# Patient Record
Sex: Female | Born: 1940 | Race: Black or African American | Hispanic: No | Marital: Single | Smoking: Former smoker
Health system: Southern US, Community
[De-identification: ages and names within clinical notes are randomized; demographics above are authoritative.]

## PROBLEM LIST (undated history)

## (undated) DIAGNOSIS — Z860100 Personal history of colon polyps, unspecified: Secondary | ICD-10-CM

## (undated) DIAGNOSIS — G5600 Carpal tunnel syndrome, unspecified upper limb: Secondary | ICD-10-CM

## (undated) DIAGNOSIS — F32A Depression, unspecified: Secondary | ICD-10-CM

## (undated) DIAGNOSIS — M1990 Unspecified osteoarthritis, unspecified site: Secondary | ICD-10-CM

## (undated) DIAGNOSIS — J45909 Unspecified asthma, uncomplicated: Secondary | ICD-10-CM

## (undated) DIAGNOSIS — K5790 Diverticulosis of intestine, part unspecified, without perforation or abscess without bleeding: Secondary | ICD-10-CM

## (undated) DIAGNOSIS — R55 Syncope and collapse: Secondary | ICD-10-CM

## (undated) DIAGNOSIS — K219 Gastro-esophageal reflux disease without esophagitis: Secondary | ICD-10-CM

## (undated) DIAGNOSIS — G4730 Sleep apnea, unspecified: Secondary | ICD-10-CM

## (undated) DIAGNOSIS — E785 Hyperlipidemia, unspecified: Secondary | ICD-10-CM

## (undated) DIAGNOSIS — R7611 Nonspecific reaction to tuberculin skin test without active tuberculosis: Secondary | ICD-10-CM

## (undated) DIAGNOSIS — K5792 Diverticulitis of intestine, part unspecified, without perforation or abscess without bleeding: Secondary | ICD-10-CM

## (undated) DIAGNOSIS — F329 Major depressive disorder, single episode, unspecified: Secondary | ICD-10-CM

## (undated) DIAGNOSIS — B019 Varicella without complication: Secondary | ICD-10-CM

## (undated) DIAGNOSIS — T7840XA Allergy, unspecified, initial encounter: Secondary | ICD-10-CM

## (undated) DIAGNOSIS — H269 Unspecified cataract: Secondary | ICD-10-CM

## (undated) DIAGNOSIS — G4700 Insomnia, unspecified: Secondary | ICD-10-CM

## (undated) DIAGNOSIS — I499 Cardiac arrhythmia, unspecified: Secondary | ICD-10-CM

## (undated) DIAGNOSIS — I10 Essential (primary) hypertension: Secondary | ICD-10-CM

## (undated) HISTORY — DX: Unspecified asthma, uncomplicated: J45.909

## (undated) HISTORY — DX: Diverticulitis of intestine, part unspecified, without perforation or abscess without bleeding: K57.92

## (undated) HISTORY — DX: Major depressive disorder, single episode, unspecified: F32.9

## (undated) HISTORY — PX: BREAST BIOPSY: SHX20

## (undated) HISTORY — PX: CARPAL TUNNEL RELEASE: SHX101

## (undated) HISTORY — DX: Varicella without complication: B01.9

## (undated) HISTORY — PX: KNEE ARTHROSCOPY: SUR90

## (undated) HISTORY — PX: BREAST EXCISIONAL BIOPSY: SUR124

## (undated) HISTORY — DX: Depression, unspecified: F32.A

## (undated) HISTORY — DX: Insomnia, unspecified: G47.00

## (undated) HISTORY — DX: Personal history of colonic polyps: Z86.010

## (undated) HISTORY — DX: Gastro-esophageal reflux disease without esophagitis: K21.9

## (undated) HISTORY — DX: Essential (primary) hypertension: I10

## (undated) HISTORY — DX: Unspecified osteoarthritis, unspecified site: M19.90

## (undated) HISTORY — DX: Allergy, unspecified, initial encounter: T78.40XA

## (undated) HISTORY — DX: Hyperlipidemia, unspecified: E78.5

## (undated) HISTORY — PX: DILATION AND CURETTAGE OF UTERUS: SHX78

## (undated) HISTORY — DX: Nonspecific reaction to tuberculin skin test without active tuberculosis: R76.11

## (undated) HISTORY — DX: Personal history of colon polyps, unspecified: Z86.0100

---

## 1978-12-16 DIAGNOSIS — R7611 Nonspecific reaction to tuberculin skin test without active tuberculosis: Secondary | ICD-10-CM

## 1978-12-16 HISTORY — DX: Nonspecific reaction to tuberculin skin test without active tuberculosis: R76.11

## 1992-12-16 HISTORY — PX: ABDOMINAL HYSTERECTOMY: SHX81

## 2014-07-25 ENCOUNTER — Ambulatory Visit (INDEPENDENT_AMBULATORY_CARE_PROVIDER_SITE_OTHER): Payer: Commercial Managed Care - HMO | Admitting: Internal Medicine

## 2014-07-25 ENCOUNTER — Encounter (INDEPENDENT_AMBULATORY_CARE_PROVIDER_SITE_OTHER): Payer: Self-pay

## 2014-07-25 ENCOUNTER — Encounter: Payer: Self-pay | Admitting: Internal Medicine

## 2014-07-25 VITALS — BP 132/74 | HR 58 | Temp 98.5°F | Ht 61.0 in | Wt 186.0 lb

## 2014-07-25 DIAGNOSIS — K219 Gastro-esophageal reflux disease without esophagitis: Secondary | ICD-10-CM | POA: Diagnosis not present

## 2014-07-25 DIAGNOSIS — I10 Essential (primary) hypertension: Secondary | ICD-10-CM | POA: Insufficient documentation

## 2014-07-25 DIAGNOSIS — G4700 Insomnia, unspecified: Secondary | ICD-10-CM

## 2014-07-25 DIAGNOSIS — H43399 Other vitreous opacities, unspecified eye: Secondary | ICD-10-CM | POA: Diagnosis not present

## 2014-07-25 DIAGNOSIS — J4540 Moderate persistent asthma, uncomplicated: Secondary | ICD-10-CM

## 2014-07-25 DIAGNOSIS — H43392 Other vitreous opacities, left eye: Secondary | ICD-10-CM

## 2014-07-25 DIAGNOSIS — F3289 Other specified depressive episodes: Secondary | ICD-10-CM

## 2014-07-25 DIAGNOSIS — F329 Major depressive disorder, single episode, unspecified: Secondary | ICD-10-CM | POA: Insufficient documentation

## 2014-07-25 DIAGNOSIS — J45909 Unspecified asthma, uncomplicated: Secondary | ICD-10-CM

## 2014-07-25 DIAGNOSIS — F32A Depression, unspecified: Secondary | ICD-10-CM | POA: Insufficient documentation

## 2014-07-25 MED ORDER — MIRTAZAPINE 15 MG PO TABS: 15.0000 mg | ORAL_TABLET | Freq: Every day | ORAL | Status: DC

## 2014-07-25 NOTE — Progress Notes (Signed)
Pre visit review using our clinic review tool, if applicable. No additional management support is needed unless otherwise documented below in the visit note. 

## 2014-07-25 NOTE — Assessment & Plan Note (Signed)
Will trial remeron Call me in 2 weeks if you think dose needs adjusting

## 2014-07-25 NOTE — Assessment & Plan Note (Signed)
Is considering seeing a therapist She declines referral today

## 2014-07-25 NOTE — Assessment & Plan Note (Signed)
Controlled on Advair and Flovent Continue for now

## 2014-07-25 NOTE — Patient Instructions (Addendum)
Insomnia Insomnia is frequent trouble falling and/or staying asleep. Insomnia can be a long term problem or a short term problem. Both are common. Insomnia can be a short term problem when the wakefulness is related to a certain stress or worry. Long term insomnia is often related to ongoing stress during waking hours and/or poor sleeping habits. Overtime, sleep deprivation itself can make the problem worse. Every little thing feels more severe because you are overtired and your ability to cope is decreased. CAUSES   Stress, anxiety, and depression.  Poor sleeping habits.  Distractions such as TV in the bedroom.  Naps close to bedtime.  Engaging in emotionally charged conversations before bed.  Technical reading before sleep.  Alcohol and other sedatives. They may make the problem worse. They can hurt normal sleep patterns and normal dream activity.  Stimulants such as caffeine for several hours prior to bedtime.  Pain syndromes and shortness of breath can cause insomnia.  Exercise late at night.  Changing time zones may cause sleeping problems (jet lag). It is sometimes helpful to have someone observe your sleeping patterns. They should look for periods of not breathing during the night (sleep apnea). They should also look to see how long those periods last. If you live alone or observers are uncertain, you can also be observed at a sleep clinic where your sleep patterns will be professionally monitored. Sleep apnea requires a checkup and treatment. Give your caregivers your medical history. Give your caregivers observations your family has made about your sleep.  SYMPTOMS   Not feeling rested in the morning.  Anxiety and restlessness at bedtime.  Difficulty falling and staying asleep. TREATMENT   Your caregiver may prescribe treatment for an underlying medical disorders. Your caregiver can give advice or help if you are using alcohol or other drugs for self-medication. Treatment  of underlying problems will usually eliminate insomnia problems.  Medications can be prescribed for short time use. They are generally not recommended for lengthy use.  Over-the-counter sleep medicines are not recommended for lengthy use. They can be habit forming.  You can promote easier sleeping by making lifestyle changes such as:  Using relaxation techniques that help with breathing and reduce muscle tension.  Exercising earlier in the day.  Changing your diet and the time of your last meal. No night time snacks.  Establish a regular time to go to bed.  Counseling can help with stressful problems and worry.  Soothing music and white noise may be helpful if there are background noises you cannot remove.  Stop tedious detailed work at least one hour before bedtime. HOME CARE INSTRUCTIONS   Keep a diary. Inform your caregiver about your progress. This includes any medication side effects. See your caregiver regularly. Take note of:  Times when you are asleep.  Times when you are awake during the night.  The quality of your sleep.  How you feel the next day. This information will help your caregiver care for you.  Get out of bed if you are still awake after 15 minutes. Read or do some quiet activity. Keep the lights down. Wait until you feel sleepy and go back to bed.  Keep regular sleeping and waking hours. Avoid naps.  Exercise regularly.  Avoid distractions at bedtime. Distractions include watching television or engaging in any intense or detailed activity like attempting to balance the household checkbook.  Develop a bedtime ritual. Keep a familiar routine of bathing, brushing your teeth, climbing into bed at the same   time each night, listening to soothing music. Routines increase the success of falling to sleep faster.  Use relaxation techniques. This can be using breathing and muscle tension release routines. It can also include visualizing peaceful scenes. You can  also help control troubling or intruding thoughts by keeping your mind occupied with boring or repetitive thoughts like the old concept of counting sheep. You can make it more creative like imagining planting one beautiful flower after another in your backyard garden.  During your day, work to eliminate stress. When this is not possible use some of the previous suggestions to help reduce the anxiety that accompanies stressful situations. MAKE SURE YOU:   Understand these instructions.  Will watch your condition.  Will get help right away if you are not doing well or get worse. Document Released: 11/29/2000 Document Revised: 02/24/2012 Document Reviewed: 12/30/2007 ExitCare Patient Information 2015 [employer]. This information is not intended to replace advice given to you by your health care provider. Make sure you discuss any questions you have with your health care provider.  

## 2014-07-25 NOTE — Progress Notes (Signed)
HPI  Pt present to the clinic today to establish care. She is transferring care from her PCP in [place].   Insomnia: This is her biggest issue. She has tried [place] in the past. She did not like the way it made her feel. She has had more trouble sleeping since her sisters diagnosis of DM2. She would like to know what else she could do.  GERD: Well controlled on protonix.  Depression: Not medicated. She is under a lot of stress currently as she is having to care for her sister. She is considering some type of therapy.  HTN: Well controlled on dual therapy.   Asthma: Moderate persistent. No recent flares. On advair and flovent.  Flu: 2014 Tetanus: 2013 Pneumovax: 2013 Zostovax: 2013 Pap Smear: total hysterectomy Mammogram: 2014 Bone Density: 2012 Colon Screening: 2015 Eye Doctor: yearly- does need referral so insurance will cover Dentist: biannually  Past Medical History  Diagnosis Date  . Asthma   . Arthritis   . Chicken pox   . Depression   . Diverticulitis   . GERD (gastroesophageal reflux disease)   . Allergy   . Hypertension   . Hyperlipidemia   . Hx of colonic polyps   . 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 Insomnia     Current Outpatient Prescriptions  Medication Sig Dispense Refill  . atenolol (TENORMIN) 50 MG tablet Take 50 mg by mouth daily. 1 1/2 tablets Daily      . Doxylamine Succinate, Sleep, (SLEEP AID PO) Take 1 tablet by mouth at bedtime as needed.      . fluticasone (FLOVENT HFA) 110 MCG/ACT inhaler Inhale 2 puffs into the lungs 2 (two) times daily.      . fluticasone-salmeterol (ADVAIR HFA) 115-21 MCG/ACT inhaler Inhale 2 puffs into the lungs 2 (two) times daily.      . hydrochlorothiazide (HYDRODIURIL) 25 MG tablet Take 25 mg by mouth daily.      . Lutein-Zeaxanthin-Selenium 15-4.75-100 MG-MG-MCG CAPS Take 1 capsule by mouth every other day.      . Multiple Vitamins-Minerals (HAIR/SKIN/NAILS PO) Take 1 capsule by mouth every other day.      . Omega 3 1000 MG  CAPS Take 1 capsule by mouth.      . pantoprazole (PROTONIX) 20 MG tablet Take 20 mg by mouth daily.       No current facility-administered medications for this visit.    Allergies  Allergen Reactions  . Avelox [Moxifloxacin Hcl In Nacl] Other (See Comments)    Chills  . Trazodone And Nefazodone Swelling    Family History  Problem Relation Age of Onset  . Breast cancer Mother   . Arthritis Maternal Grandmother   . Diabetes Maternal Grandmother     History   Social History  . Marital Status: Single    Spouse Name: N/A    Number of Children: N/A  . Years of Education: N/A   Occupational History  . Not on file.   Social History Main Topics  . Smoking status: Never Smoker   . Smokeless tobacco: Never Used     Comment: quit over 50 years ago  . Alcohol Use: Yes     Comment: rare--liquor  . Drug Use: Not on file  . Sexual Activity: Not on file   Other Topics Concern  . Not on file   Social History Narrative  . No narrative on file    ROS:  Constitutional: Denies fever, malaise, fatigue, headache or abrupt weight changes.  Respiratory:  Denies difficulty breathing, shortness of breath, cough or sputum production.   Cardiovascular: Denies chest pain, chest tightness, palpitations or swelling in the hands or feet.  Gastrointestinal: Denies abdominal pain, bloating, constipation, diarrhea or blood in the stool.  Musculoskeletal: Pt reports chronic back pain. Denies decrease in range of motion, difficulty with gait, muscle pain or joint pain and swelling.  Neurological: Denies dizziness, difficulty with memory, difficulty with speech or problems with balance and coordination.   No other specific complaints in a complete review of systems (except as listed in HPI above).  PE:  BP 132/74  Pulse 58  Temp(Src) 98.5 F (36.9 C) (Oral)  Ht 5\' 1"  (1.549 m)  Wt 186 lb (84.369 kg)  BMI 35.16 kg/m2  SpO2 98% Wt Readings from Last 3 Encounters:  07/25/14 186 lb (84.369  kg)    General: Appears her stated age, well developed, well nourished in NAD. Cardiovascular: Normal rate and rhythm. S1,S2 noted.  No murmur, rubs or gallops noted. No JVD or BLE edema. No carotid bruits noted. Pulmonary/Chest: Normal effort and positive vesicular breath sounds. No respiratory distress. No wheezes, rales or ronchi noted.  Abdomen: Soft and nontender. Normal bowel sounds, no bruits noted. No distention or masses noted. Liver, spleen and kidneys non palpable. Neurological: Alert and oriented. Cranial nerves grossly intact.  Psychiatric: Mood and affect flat. Behavior is normal. Judgment and thought content normal.      Assessment and Plan:  Floaters:  Will refer to optho  RTC in 6 months for physical

## 2014-07-25 NOTE — Assessment & Plan Note (Signed)
Well controlled on current therapy No refills needed

## 2014-07-25 NOTE — Assessment & Plan Note (Signed)
No issues on protonix

## 2014-08-29 ENCOUNTER — Telehealth: Payer: Self-pay

## 2014-08-29 MED ORDER — FLUTICASONE PROPIONATE 50 MCG/ACT NA SUSP: 2.0000 | Freq: Every day | NASAL | Status: DC

## 2014-08-29 NOTE — Addendum Note (Signed)
Addended by: Lurlean Nanny on: 08/29/2014 03:35 PM   Modules accepted: Orders

## 2014-08-29 NOTE — Telephone Encounter (Signed)
Ok to send in flonase

## 2014-08-29 NOTE — Telephone Encounter (Signed)
Rx sent through [facility]  

## 2014-08-29 NOTE — Telephone Encounter (Signed)
Pt left v/m requesting refill fluticasone 50 mcg nasal spray. Do not see on med list.Please advise. Pt request cb. [facility] [address]

## 2014-09-06 ENCOUNTER — Ambulatory Visit: Payer: Commercial Managed Care - HMO

## 2014-09-06 DIAGNOSIS — Z23 Encounter for immunization: Secondary | ICD-10-CM

## 2014-09-22 ENCOUNTER — Other Ambulatory Visit: Payer: Self-pay | Admitting: Internal Medicine

## 2014-09-22 ENCOUNTER — Telehealth: Payer: Self-pay | Admitting: Internal Medicine

## 2014-09-22 NOTE — Telephone Encounter (Signed)
Yes, send with current directions

## 2014-09-22 NOTE — Telephone Encounter (Signed)
Pt walked in and stated that [facility] [phone]) had requested refill on atenolol (TENORMIN) 50 MG tablet [[id number]], could not see evidence of this, but pharmacy is also not listed on this med.  Please refill, best number to call pt is [phone] / lt

## 2014-09-22 NOTE — Telephone Encounter (Signed)
i called pharmacy and they stated the Rx they have is 50 mg 1 1/2 tablets--so yes it is 5 mg--please advise okay to send Rx to pharmacy as stated?

## 2014-09-22 NOTE — Telephone Encounter (Signed)
Can we clarify the dose? 50 or 75 mg? RB

## 2014-09-23 MED ORDER — ATENOLOL 50 MG PO TABS: 50.0000 mg | ORAL_TABLET | Freq: Every day | ORAL | Status: DC

## 2014-09-23 NOTE — Addendum Note (Signed)
Addended by: Lurlean Nanny on: 09/23/2014 08:35 AM   Modules accepted: Orders

## 2014-09-23 NOTE — Telephone Encounter (Signed)
Rx sent through [facility]  

## 2014-10-04 ENCOUNTER — Ambulatory Visit: Payer: Self-pay | Admitting: Family Medicine

## 2014-10-21 ENCOUNTER — Other Ambulatory Visit: Payer: Self-pay

## 2014-10-21 MED ORDER — ATENOLOL 50 MG PO TABS: 50.0000 mg | ORAL_TABLET | Freq: Every day | ORAL | Status: DC

## 2014-10-21 MED ORDER — HYDROCHLOROTHIAZIDE 25 MG PO TABS: 25.0000 mg | ORAL_TABLET | Freq: Every day | ORAL | Status: DC

## 2014-10-21 MED ORDER — PANTOPRAZOLE SODIUM 20 MG PO TBEC: 20.0000 mg | DELAYED_RELEASE_TABLET | Freq: Every day | ORAL | Status: DC

## 2014-10-21 NOTE — Telephone Encounter (Signed)
Labs reviewed, OK to send requested meds to mail order

## 2014-10-21 NOTE — Telephone Encounter (Signed)
Pt is using mail order now--the HCTZ and Protonix has never been filled by you--please advise

## 2014-10-25 MED ORDER — HYDROCHLOROTHIAZIDE 25 MG PO TABS: 25.0000 mg | ORAL_TABLET | Freq: Every day | ORAL | Status: DC

## 2014-10-25 MED ORDER — PANTOPRAZOLE SODIUM 20 MG PO TBEC: 20.0000 mg | DELAYED_RELEASE_TABLET | Freq: Every day | ORAL | Status: DC

## 2014-10-25 NOTE — Addendum Note (Signed)
Addended by: Lurlean Nanny on: 10/25/2014 05:09 PM   Modules accepted: Orders

## 2014-10-26 ENCOUNTER — Telehealth: Payer: Self-pay | Admitting: Internal Medicine

## 2014-10-26 ENCOUNTER — Ambulatory Visit (INDEPENDENT_AMBULATORY_CARE_PROVIDER_SITE_OTHER): Payer: Medicare HMO | Admitting: Psychology

## 2014-10-26 DIAGNOSIS — F4322 Adjustment disorder with anxiety: Secondary | ICD-10-CM

## 2014-10-26 NOTE — Telephone Encounter (Signed)
Spoke to pt's sister and she is aware that Rx was originally sent to local pharmacy by mistake by [facility] and I sent Rx to mail order

## 2014-10-26 NOTE — Telephone Encounter (Signed)
Thanks for fixing melanie

## 2014-10-26 NOTE — Telephone Encounter (Signed)
Pt is inquiring about her [employer] mail in pharmacy. Pt wants to speak with Threasa Beards about the mail delivery pharmacy. [employer] informed pt that they sent you a fax with info but pt is stating it went to her old pharmacy, [facility], instead. Please inquire. Thank you!

## 2014-11-14 ENCOUNTER — Other Ambulatory Visit: Payer: Self-pay

## 2014-11-14 MED ORDER — FLUTICASONE PROPIONATE 50 MCG/ACT NA SUSP: 2.0000 | Freq: Every day | NASAL | Status: DC

## 2014-11-16 ENCOUNTER — Ambulatory Visit (INDEPENDENT_AMBULATORY_CARE_PROVIDER_SITE_OTHER): Payer: Commercial Managed Care - HMO | Admitting: Psychology

## 2014-11-16 DIAGNOSIS — F4323 Adjustment disorder with mixed anxiety and depressed mood: Secondary | ICD-10-CM

## 2014-12-17 DIAGNOSIS — R05 Cough: Secondary | ICD-10-CM | POA: Diagnosis not present

## 2014-12-17 DIAGNOSIS — J45909 Unspecified asthma, uncomplicated: Secondary | ICD-10-CM | POA: Diagnosis not present

## 2014-12-21 ENCOUNTER — Ambulatory Visit: Payer: Commercial Managed Care - HMO | Admitting: Psychology

## 2014-12-29 ENCOUNTER — Ambulatory Visit (INDEPENDENT_AMBULATORY_CARE_PROVIDER_SITE_OTHER): Payer: Commercial Managed Care - HMO | Admitting: Internal Medicine

## 2014-12-29 ENCOUNTER — Encounter: Payer: Self-pay | Admitting: Internal Medicine

## 2014-12-29 VITALS — BP 120/84 | HR 59 | Temp 99.0°F | Wt 288.0 lb

## 2014-12-29 DIAGNOSIS — J0100 Acute maxillary sinusitis, unspecified: Secondary | ICD-10-CM | POA: Diagnosis not present

## 2014-12-29 MED ORDER — ALBUTEROL SULFATE HFA 108 (90 BASE) MCG/ACT IN AERS
2.0000 | INHALATION_SPRAY | Freq: Four times a day (QID) | RESPIRATORY_TRACT | Status: DC | PRN
Start: 2014-12-29 — End: 2017-06-20

## 2014-12-29 NOTE — Patient Instructions (Signed)

## 2014-12-29 NOTE — Progress Notes (Signed)
HPI  Molly Faulkner is a 74 y.o. female presenting for c/o cough, chest congestion and facial pain/pressure x 1 week.  Cough is productive w/ clear-yellowish sputum.  Facial pain is diffuse above eyes and cheeks. She just completed a 10-day treatment of Augmentin and Prednisone 2 days ago for an upper respiratory infection, with marked improvement in symptoms. Denies sick contacts.  Review of Systems    Past Medical History  Diagnosis Date  . Asthma   . Arthritis   . Chicken pox   . Depression   . Diverticulitis   . GERD (gastroesophageal reflux disease)   . Allergy   . Hypertension   . Hyperlipidemia   . Hx of colonic polyps   . 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 Insomnia     Family History  Problem Relation Age of Onset  . Breast cancer Mother   . Arthritis Maternal Grandmother   . Diabetes Maternal Grandmother     History   Social History  . Marital Status: Single    Spouse Name: N/A    Number of Children: N/A  . Years of Education: N/A   Occupational History  . Not on file.   Social History Main Topics  . Smoking status: Never Smoker   . Smokeless tobacco: Never Used     Comment: quit over 50 years ago  . Alcohol Use: Yes     Comment: rare--liquor  . Drug Use: No  . Sexual Activity: Not Currently   Other Topics Concern  . Not on file   Social History Narrative    Allergies  Allergen Reactions  . Avelox [Moxifloxacin Hcl In Nacl] Other (See Comments)    Chills  . Trazodone And Nefazodone Swelling     Constitutional: Positive fatigue. Denies headache, fever, and abrupt weight changes.  HEENT:  Positive facial pain, nasal congestion and sore throat. Denies eye redness, ear pain, ringing in the ears, wax buildup, runny nose or bloody nose. Respiratory: Positive cough. Denies difficulty breathing or shortness of breath.  Cardiovascular: Denies chest pain, chest tightness, palpitations or swelling in the hands or feet.   No other specific complaints in a complete  review of systems (except as listed in HPI above).  Objective:  BP 120/84 mmHg  Pulse 59  Temp(Src) 99 F (37.2 C) (Oral)  Wt 288 lb (130.636 kg)  SpO2 98%  General: Appears her stated age, well developed, well nourished in NAD. HEENT: Head: normal shape and size, sinus tenderness above left eye noted; Eyes: sclera white, no icterus, conjunctiva pink; Ears: Tm's gray and intact, normal light reflex; Nose: mucosa pink and moist, septum midline; Throat/Mouth: + PND. Teeth present, mucosa pink and moist, no exudate noted, no lesions or ulcerations noted. No lymphadenopathy. Cardiovascular: Normal rate and rhythm. S1,S2 noted.  No murmur, rubs or gallops noted.  Pulmonary/Chest: Normal effort and positive vesicular breath sounds. No respiratory distress. No wheezes, rales or ronchi noted.      Assessment & Plan:   Bacterial sinusitis, resolved:  Flonase 2 sprays each nostril for 3 days and then as needed. Rest and plenty of fluid.  RTC as needed or if symptoms persist.

## 2014-12-29 NOTE — Progress Notes (Signed)
Pre visit review using our clinic review tool, if applicable. No additional management support is needed unless otherwise documented below in the visit note. 

## 2015-01-18 ENCOUNTER — Telehealth: Payer: Self-pay

## 2015-01-18 NOTE — Telephone Encounter (Signed)
She had told me in the past that she did not like the way the [place] made her feel. Would she consider an alternative med like trazadone if the remeron did not work?

## 2015-01-18 NOTE — Telephone Encounter (Signed)
Pt left v/m requesting rx for ambien 10 mg to help pt sleep. Pt has taken ambien 10 mg from doctor in [place] before moving to [place]. Pt can go to sleep but wakes up in early morning hours and cannot go back to sleep. Mirtazapine 1/2 tab made pt very dizzy and pt did not take any more of Mirtazapine. [address]Please advise.

## 2015-01-19 NOTE — Telephone Encounter (Signed)
Left message on voicemail.

## 2015-01-20 MED ORDER — ZOLPIDEM TARTRATE 10 MG PO TABS: 10.0000 mg | ORAL_TABLET | Freq: Every evening | ORAL | Status: DC | PRN

## 2015-01-20 NOTE — Telephone Encounter (Signed)
PA submitted through covermymeds---will fax Rx to [facility] as it is a [place] only rx and QL of 90 per 365 days

## 2015-01-20 NOTE — Addendum Note (Signed)
Addended by: Lurlean Nanny on: 01/20/2015 01:54 PM   Modules accepted: Orders

## 2015-01-23 ENCOUNTER — Telehealth: Payer: Self-pay | Admitting: Internal Medicine

## 2015-01-23 NOTE — Telephone Encounter (Signed)
Pt came in today stating  That her a melanie spoke  On Friday about rx for Ambien 10mg  [employer] would not cover this.  She stated she talked to [facility] this morning.  She said [employer] would cover zolpidem. She wanted you to check with [employer] to confirmed they will cover zolpidem.   She would like a paper rx for zolpidem   Please call when rx is ready or if you have any questions  Zolpidem is generic for [employer] Pt only has 4 pills left

## 2015-01-24 NOTE — Telephone Encounter (Signed)
PA has been resubmitted 90 per 365 days--per [employer] mail order formulary--will fax Rx if approved

## 2015-02-09 ENCOUNTER — Encounter: Payer: Self-pay | Admitting: Internal Medicine

## 2015-02-09 ENCOUNTER — Ambulatory Visit (INDEPENDENT_AMBULATORY_CARE_PROVIDER_SITE_OTHER): Payer: Commercial Managed Care - HMO | Admitting: Internal Medicine

## 2015-02-09 VITALS — BP 138/90 | HR 63 | Temp 98.7°F | Wt 190.0 lb

## 2015-02-09 DIAGNOSIS — H6981 Other specified disorders of Eustachian tube, right ear: Secondary | ICD-10-CM

## 2015-02-09 DIAGNOSIS — I10 Essential (primary) hypertension: Secondary | ICD-10-CM

## 2015-02-09 DIAGNOSIS — I440 Atrioventricular block, first degree: Secondary | ICD-10-CM | POA: Diagnosis not present

## 2015-02-09 DIAGNOSIS — R42 Dizziness and giddiness: Secondary | ICD-10-CM | POA: Diagnosis not present

## 2015-02-09 DIAGNOSIS — H6991 Unspecified Eustachian tube disorder, right ear: Secondary | ICD-10-CM

## 2015-02-09 NOTE — Progress Notes (Signed)
Subjective:    Patient ID: Molly Faulkner, female    DOB: 05/16/1941, 74 y.o.   MRN: [id number]  HPI  Pt presents to the clinic today with c/o elevated BP. She reports this morning she felt lightheaded. She checked her BP at that time and it was 170/90 with a HR of 106 (She had not taken her BP medication yet). She did feel like her hands were clammy. She denies chest pain or shortness of breath. She reports she had a similar episode 10 days ago while she was flying but attributed it to being on the plane. She does take her atenolol and HCTZ as prescribed. She denies sinus, URI symptoms, ear pain, nausea or vomiting. She has not tried anything OTC.  Review of Systems      Past Medical History  Diagnosis Date  . Asthma   . Arthritis   . Chicken pox   . Depression   . Diverticulitis   . GERD (gastroesophageal reflux disease)   . Allergy   . Hypertension   . Hyperlipidemia   . Hx of colonic polyps   . 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 Insomnia     Current Outpatient Prescriptions  Medication Sig Dispense Refill  . albuterol (PROVENTIL HFA;VENTOLIN HFA) 108 (90 BASE) MCG/ACT inhaler Inhale 2 puffs into the lungs every 6 (six) hours as needed for wheezing or shortness of breath. 1 Inhaler 0  . atenolol (TENORMIN) 50 MG tablet Take 1 tablet (50 mg total) by mouth daily. 1 1/2 tablets Daily 135 tablet 1  . Doxylamine Succinate, Sleep, (SLEEP AID PO) Take 1 tablet by mouth at bedtime as needed.    . fluticasone (FLONASE) 50 MCG/ACT nasal spray Place 2 sprays into both nostrils daily. 48 g 0  . fluticasone (FLOVENT HFA) 110 MCG/ACT inhaler Inhale 2 puffs into the lungs 2 (two) times daily.    . fluticasone-salmeterol (ADVAIR HFA) 115-21 MCG/ACT inhaler Inhale 2 puffs into the lungs 2 (two) times daily.    . hydrochlorothiazide (HYDRODIURIL) 25 MG tablet Take 1 tablet (25 mg total) by mouth daily. 90 tablet 1  . Lutein-Zeaxanthin-Selenium 15-4.75-100 MG-MG-MCG CAPS Take 1 capsule by mouth every  other day.    . Multiple Vitamins-Minerals (HAIR/SKIN/NAILS PO) Take 1 capsule by mouth every other day.    . Omega 3 1000 MG CAPS Take 1 capsule by mouth.    . pantoprazole (PROTONIX) 20 MG tablet Take 1 tablet (20 mg total) by mouth daily. 90 tablet 1  . zolpidem (AMBIEN) 10 MG tablet Take 1 tablet (10 mg total) by mouth at bedtime as needed for sleep. 90 tablet 0   No current facility-administered medications for this visit.    Allergies  Allergen Reactions  . Avelox [Moxifloxacin Hcl In Nacl] Other (See Comments)    Chills  . Trazodone And Nefazodone Swelling    Family History  Problem Relation Age of Onset  . Breast cancer Mother   . Arthritis Maternal Grandmother   . Diabetes Maternal Grandmother     History   Social History  . Marital Status: Single    Spouse Name: N/A  . Number of Children: N/A  . Years of Education: N/A   Occupational History  . Not on file.   Social History Main Topics  . Smoking status: Never Smoker   . Smokeless tobacco: Never Used     Comment: quit over 50 years ago  . Alcohol Use: Yes     Comment: rare--liquor  . Drug  Use: No  . Sexual Activity: Not Currently   Other Topics Concern  . Not on file   Social History Narrative     Constitutional: Denies fever, malaise, fatigue, headache or abrupt weight changes. Respiratory: Denies difficulty breathing, shortness of breath, cough or sputum production.   Cardiovascular: Denies chest pain, chest tightness, palpitations or swelling in the hands or feet.  Gastrointestinal: Denies abdominal pain, bloating, constipation, diarrhea or blood in the stool.  GU: Denies urgency, frequency, pain with urination, burning sensation, blood in urine, odor or discharge. Skin: Denies redness, rashes, lesions or ulcercations.  Neurological: Pt reports lightheadedness. Denies difficulty with memory, difficulty with speech.   No other specific complaints in a complete review of systems (except as listed in  HPI above).  Objective:   Physical Exam  BP 138/90 mmHg  Pulse 63  Temp(Src) 98.7 F (37.1 C) (Oral)  Wt 190 lb (86.183 kg)  SpO2 98% Wt Readings from Last 3 Encounters:  02/09/15 190 lb (86.183 kg)  12/29/14 288 lb (130.636 kg)  07/25/14 186 lb (84.369 kg)    General: Appears her stated age, well developed, well nourished in NAD. Skin: Warm, dry and intact. No rashes, lesions or ulcerations noted. HEENT: Head: normal shape and size; Eyes: sclera white, no icterus, conjunctiva pink, PERRLA and EOMs intact; Ears: Tm's pink but intact, normal light reflex, + serous effusion on the right; Cardiovascular: Normal rate and rhythm. S1,S2 noted.  No murmur, rubs or gallops noted.  Pulmonary/Chest: Normal effort and positive vesicular breath sounds. No respiratory distress. No wheezes, rales or ronchi noted.  Abdomen: Soft and nontender. Normal bowel sounds, no bruits noted. No distention or masses noted. Liver, spleen and kidneys non palpable. Neurological: Alert and oriented.       Assessment & Plan:   Lightheadedness and elevated blood pressure:  Mild ETD on right- try flonase OTC (could be the reason for the lightheadedness) Exam normal BP now normal Will obtain ECG to r/o cardiac cause ECG: First degree AV block, change from 07/2014- will refer to cardiology  RTC as needed or if symptoms persist or worsen

## 2015-02-09 NOTE — Patient Instructions (Signed)

## 2015-02-09 NOTE — Progress Notes (Signed)
Pre visit review using our clinic review tool, if applicable. No additional management support is needed unless otherwise documented below in the visit note. 

## 2015-02-10 ENCOUNTER — Telehealth: Payer: Self-pay | Admitting: Internal Medicine

## 2015-02-10 NOTE — Telephone Encounter (Signed)
emmi emailed °

## 2015-02-13 NOTE — Telephone Encounter (Signed)
Had been denied once again pt is aware

## 2015-02-21 ENCOUNTER — Ambulatory Visit (INDEPENDENT_AMBULATORY_CARE_PROVIDER_SITE_OTHER): Payer: Commercial Managed Care - HMO | Admitting: Cardiovascular Disease

## 2015-02-21 ENCOUNTER — Encounter: Payer: Self-pay | Admitting: Cardiovascular Disease

## 2015-02-21 ENCOUNTER — Encounter (INDEPENDENT_AMBULATORY_CARE_PROVIDER_SITE_OTHER): Payer: Self-pay

## 2015-02-21 VITALS — BP 110/80 | HR 66 | Ht 61.0 in | Wt 188.5 lb

## 2015-02-21 DIAGNOSIS — R9431 Abnormal electrocardiogram [ECG] [EKG]: Secondary | ICD-10-CM

## 2015-02-21 DIAGNOSIS — R000 Tachycardia, unspecified: Secondary | ICD-10-CM

## 2015-02-21 DIAGNOSIS — I10 Essential (primary) hypertension: Secondary | ICD-10-CM

## 2015-02-21 DIAGNOSIS — E785 Hyperlipidemia, unspecified: Secondary | ICD-10-CM | POA: Diagnosis not present

## 2015-02-21 DIAGNOSIS — R55 Syncope and collapse: Secondary | ICD-10-CM

## 2015-02-21 MED ORDER — LOSARTAN POTASSIUM 100 MG PO TABS: 100.0000 mg | ORAL_TABLET | Freq: Every day | ORAL | Status: DC

## 2015-02-21 NOTE — Patient Instructions (Signed)
You are doing well.  Please hold the HCTZ Please start losartan 1/2 pill daily Monitor your blood pressure If it runs high (>140 ), go to a full pill  If you have more arrhythmia, tachycardia, Call the office   We will check BMP/labs today  Please call us if you have new issues that need to be addressed before your next appt.

## 2015-02-21 NOTE — Assessment & Plan Note (Signed)
She is interested in changing her HCTZ. We will check basic metabolic panel today given the possibility of low potassium, dehydration. She has been eating more bananas and drinking more recently. At her request, she will hold the HCTZ, we will start losartan 50 mg daily with titration up to 100 mg daily if needed. Recommended she continue on her atenolol for now

## 2015-02-21 NOTE — Progress Notes (Signed)
Patient ID: Molly Faulkner, female    DOB: [birth year], 74 y.o.   MRN: [id number]  HPI Comments: Molly Faulkner is a very pleasant 74 year old woman with history of hypertension, presenting with symptoms of syncope, tachycardia.  She reports that earlier in February 2016 she was on a plane going to [place]. She had not drink much fluids, had a breakfast bar and a bottle of water. She had a queasy feeling in her stomach while on the plane, she got up the window seat, moved into the asle, when she developed lightheadedness, dizziness and acute syncope. She feels that she was only out for several seconds before she came to on the ground. She sat there for several minutes until she felt better, sat and had some juice for several minutes, felt better and then went to the bathroom in the back of the plane. Rest of the trip was uneventful.  10 days later approximately she woke up with tachycardia, February 09, 2015, measured her heart rate at 110 bpm, blood pressure also elevated. She felt general malaise, felt weird She rested and eventually after over one hour, heart rate came down, blood pressure came down. She went to see primary care and on arrival to their office, she reports that her palpitations resolved. EKG at that time showed heart rate in the 60s. Since then she has not had any further tachycardia or arrhythmia. Reports that she does not have arrhythmia on a regular basis, only at one time. She is concerned about the HCTZ. After that event, she started taking more bananas. No lab work at that time.  EKG on today's visit showing normal sinus rhythm with rate 69 bpm, first-degree AV block, PVCs him a rare. No change from prior EKG Orthostatics done in the office today shows blood pressure 143/81 supine, 127/85 sitting, 137/89 standing, after 3 minutes 132/85. Mild change in heart rate from heart rate 60 to 68 to 72 down to 63 in recovery after 3 minutes standing. Mild change   Allergies  Allergen  Reactions  . Avelox [Moxifloxacin Hcl In Nacl] Other (See Comments)    Chills  . Trazodone And Nefazodone Swelling    Outpatient Encounter Prescriptions as of 02/21/2015  Medication Sig  . albuterol (PROVENTIL HFA;VENTOLIN HFA) 108 (90 BASE) MCG/ACT inhaler Inhale 2 puffs into the lungs every 6 (six) hours as needed for wheezing or shortness of breath.  Marland Kitchen atenolol (TENORMIN) 50 MG tablet Take 1 tablet (50 mg total) by mouth daily. 1 1/2 tablets Daily  . Doxylamine Succinate, Sleep, (SLEEP AID PO) Take 1 tablet by mouth at bedtime as needed.  . fluticasone (FLONASE) 50 MCG/ACT nasal spray Place 2 sprays into both nostrils daily.  . fluticasone-salmeterol (ADVAIR HFA) 115-21 MCG/ACT inhaler Inhale 1 puff into the lungs 2 (two) times daily.   . hydrochlorothiazide (HYDRODIURIL) 25 MG tablet Take 1 tablet (25 mg total) by mouth daily.  . Lutein-Zeaxanthin-Selenium 15-4.75-100 MG-MG-MCG CAPS Take 1 capsule by mouth every other day.  . Multiple Vitamins-Minerals (HAIR/SKIN/NAILS PO) Take 1 capsule by mouth every other day.  . Omega 3 1000 MG CAPS Take 1 capsule by mouth.  . pantoprazole (PROTONIX) 20 MG tablet Take 1 tablet (20 mg total) by mouth daily. (Patient taking differently: Take 20 mg by mouth every 3 (three) days. )  . zolpidem (AMBIEN) 10 MG tablet Take 1 tablet (10 mg total) by mouth at bedtime as needed for sleep.  Marland Kitchen losartan (COZAAR) 100 MG tablet Take 1 tablet (100 mg total)  by mouth daily.  . [DISCONTINUED] fluticasone (FLOVENT HFA) 110 MCG/ACT inhaler Inhale 2 puffs into the lungs 2 (two) times daily.    Past Medical History  Diagnosis Date  . Asthma   . Arthritis   . Chicken pox   . Depression   . Diverticulitis   . GERD (gastroesophageal reflux disease)   . Allergy   . Hypertension   . Hyperlipidemia   . Hx of colonic polyps   . 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 Insomnia     Past Surgical History  Procedure Laterality Date  . Breast biopsy Bilateral [id number]  . Abdominal  hysterectomy  1994    total  . Carpal tunnel release Bilateral     Social History  reports that she has never smoked. She has never used smokeless tobacco. She reports that she drinks alcohol. She reports that she does not use illicit drugs.  Family History family history includes Arthritis in her maternal grandmother; Breast cancer in her mother; Diabetes in her maternal grandmother.   Review of Systems  Constitutional: Negative.   HENT: Negative.   Respiratory: Negative.   Cardiovascular: Negative.   Gastrointestinal: Negative.   Musculoskeletal: Negative.   Neurological: Positive for dizziness and syncope.  Hematological: Negative.   Psychiatric/Behavioral: Negative.   All other systems reviewed and are negative.   BP 110/80 mmHg  Pulse 66  Ht 5\' 1"  (1.549 m)  Wt 188 lb 8 oz (85.503 kg)  BMI 35.64 kg/m2   Orthostatics as above Physical Exam  Constitutional: She is oriented to person, place, and time. She appears well-developed and well-nourished.  HENT:  Head: Normocephalic.  Nose: Nose normal.  Mouth/Throat: Oropharynx is clear and moist.  Eyes: Conjunctivae are normal. Pupils are equal, round, and reactive to light.  Neck: Normal range of motion. Neck supple. No JVD present.  Cardiovascular: Normal rate, regular rhythm, S1 normal, S2 normal, normal heart sounds and intact distal pulses.  Exam reveals no gallop and no friction rub.   No murmur heard. Pulmonary/Chest: Effort normal and breath sounds normal. No respiratory distress. She has no wheezes. She has no rales. She exhibits no tenderness.  Abdominal: Soft. Bowel sounds are normal. She exhibits no distension. There is no tenderness.  Musculoskeletal: Normal range of motion. She exhibits no edema or tenderness.  Lymphadenopathy:    She has no cervical adenopathy.  Neurological: She is alert and oriented to person, place, and time. Coordination normal.  Skin: Skin is warm and dry. No rash noted. No erythema.   Psychiatric: She has a normal mood and affect. Her behavior is normal. Judgment and thought content normal.    Assessment and Plan  Nursing note and vitals reviewed.

## 2015-02-21 NOTE — Assessment & Plan Note (Addendum)
Etiology of her recent tachycardia with heart rate up to 110 by her report is unclear. Unable to exclude atrial tachycardia or atrial fibrillation. She does not want a Holter or 30 day monitor at this time. Unable to exclude electrolyte abnormality as a cause of her issues. Recommended that if she has recurrent episodes that she call our office for a monitor. She could take additional half dose of atenolol if needed for symptoms if they recur. EKG essentially benign. Discussed first degree AV block with her. No workup needed for this

## 2015-02-21 NOTE — Assessment & Plan Note (Signed)
I suspect her recent episode of syncope was possibly vasovagal in etiology him a precipitated by abdominal issues.  No further symptoms since that time. Medication changes as above. If she has recurrent issues, discussed further workup could be done including Holter monitors or 30 day monitor. She has prior echocardiogram and stress test which she will bring to the office

## 2015-02-21 NOTE — Assessment & Plan Note (Signed)
Discussed her cholesterol with her. She is not diabetic, nonsmoker. Recommended diet and exercise for now

## 2015-02-22 LAB — BASIC METABOLIC PANEL
BUN / CREAT RATIO: 15 (ref 11–26)
BUN: 14 mg/dL (ref 8–27)
CALCIUM: 9.2 mg/dL (ref 8.7–10.3)
CO2: 25 mmol/L (ref 18–29)
CREATININE: 0.95 mg/dL (ref 0.57–1.00)
Chloride: 101 mmol/L (ref 97–108)
GFR calc Af Amer: 70 mL/min/{1.73_m2} (ref >59)
GFR calc non Af Amer: 60 mL/min/{1.73_m2} (ref >59)
Glucose: 105 mg/dL — ABNORMAL HIGH (ref 65–99)
Potassium: 4.3 mmol/L (ref 3.5–5.2)
SODIUM: 140 mmol/L (ref 134–144)

## 2015-03-28 ENCOUNTER — Ambulatory Visit: Payer: Commercial Managed Care - HMO | Admitting: Psychology

## 2015-03-29 ENCOUNTER — Ambulatory Visit (INDEPENDENT_AMBULATORY_CARE_PROVIDER_SITE_OTHER): Payer: Medicare HMO | Admitting: Psychology

## 2015-03-29 DIAGNOSIS — F4323 Adjustment disorder with mixed anxiety and depressed mood: Secondary | ICD-10-CM | POA: Diagnosis not present

## 2015-04-14 DIAGNOSIS — M62838 Other muscle spasm: Secondary | ICD-10-CM | POA: Diagnosis not present

## 2015-04-14 DIAGNOSIS — M9901 Segmental and somatic dysfunction of cervical region: Secondary | ICD-10-CM | POA: Diagnosis not present

## 2015-04-14 DIAGNOSIS — M9903 Segmental and somatic dysfunction of lumbar region: Secondary | ICD-10-CM | POA: Diagnosis not present

## 2015-04-14 DIAGNOSIS — M9902 Segmental and somatic dysfunction of thoracic region: Secondary | ICD-10-CM | POA: Diagnosis not present

## 2015-04-14 DIAGNOSIS — M546 Pain in thoracic spine: Secondary | ICD-10-CM | POA: Diagnosis not present

## 2015-04-14 DIAGNOSIS — M25559 Pain in unspecified hip: Secondary | ICD-10-CM | POA: Diagnosis not present

## 2015-04-14 DIAGNOSIS — M542 Cervicalgia: Secondary | ICD-10-CM | POA: Diagnosis not present

## 2015-04-24 ENCOUNTER — Ambulatory Visit (INDEPENDENT_AMBULATORY_CARE_PROVIDER_SITE_OTHER): Payer: Commercial Managed Care - HMO | Admitting: Internal Medicine

## 2015-04-24 ENCOUNTER — Encounter: Payer: Self-pay | Admitting: Internal Medicine

## 2015-04-24 VITALS — BP 138/96 | HR 60 | Temp 98.8°F | Wt 190.0 lb

## 2015-04-24 DIAGNOSIS — M7989 Other specified soft tissue disorders: Secondary | ICD-10-CM

## 2015-04-24 NOTE — Patient Instructions (Signed)
Generalized Anxiety Disorder Generalized anxiety disorder (GAD) is a mental disorder. It interferes with life functions, including relationships, work, and school. GAD is different from normal anxiety, which everyone experiences at some point in their lives in response to specific life events and activities. Normal anxiety actually helps us prepare for and get through these life events and activities. Normal anxiety goes away after the event or activity is over.  GAD causes anxiety that is not necessarily related to specific events or activities. It also causes excess anxiety in proportion to specific events or activities. The anxiety associated with GAD is also difficult to control. GAD can vary from mild to severe. People with severe GAD can have intense waves of anxiety with physical symptoms (panic attacks).  SYMPTOMS The anxiety and worry associated with GAD are difficult to control. This anxiety and worry are related to many life events and activities and also occur more days than not for 6 months or longer. People with GAD also have three or more of the following symptoms (one or more in children):  Restlessness.   Fatigue.  Difficulty concentrating.   Irritability.  Muscle tension.  Difficulty sleeping or unsatisfying sleep. DIAGNOSIS GAD is diagnosed through an assessment by your health care provider. Your health care provider will ask you questions aboutyour mood,physical symptoms, and events in your life. Your health care provider may ask you about your medical history and use of alcohol or drugs, including prescription medicines. Your health care provider may also do a physical exam and blood tests. Certain medical conditions and the use of certain substances can cause symptoms similar to those associated with GAD. Your health care provider may refer you to a mental health specialist for further evaluation. TREATMENT The following therapies are usually used to treat GAD:    Medication. Antidepressant medication usually is prescribed for long-term daily control. Antianxiety medicines may be added in severe cases, especially when panic attacks occur.   Talk therapy (psychotherapy). Certain types of talk therapy can be helpful in treating GAD by providing support, education, and guidance. A form of talk therapy called cognitive behavioral therapy can teach you healthy ways to think about and react to daily life events and activities.  Stress managementtechniques. These include yoga, meditation, and exercise and can be very helpful when they are practiced regularly. A mental health specialist can help determine which treatment is best for you. Some people see improvement with one therapy. However, other people require a combination of therapies. Document Released: 03/29/2013 Document Revised: 04/18/2014 Document Reviewed: 03/29/2013 Exit[employer] Patient Information 2015 [employer]. This information is not intended to replace advice given to you by your health care provider. Make sure you discuss any questions you have with your health care provider.  

## 2015-04-24 NOTE — Progress Notes (Signed)
Pre visit review using our clinic review tool, if applicable. No additional management support is needed unless otherwise documented below in the visit note. 

## 2015-04-24 NOTE — Progress Notes (Signed)
Subjective:    Patient ID: Molly Faulkner, female    DOB: [birth year], 74 y.o.   MRN: [id number]  HPI  Pt presents to the clinic today with c/o swelling under her right arm. She noticed this 2 weeks ago. She denies pain in the area but reports it feels "strange". She denies any injury to the shoulders. She has never noticed this swelling before. She has not tried anything OTC for this.  Review of Systems      Past Medical History  Diagnosis Date  . Asthma   . Arthritis   . Chicken pox   . Depression   . Diverticulitis   . GERD (gastroesophageal reflux disease)   . Allergy   . Hypertension   . Hyperlipidemia   . Hx of colonic polyps   . 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 Insomnia     Current Outpatient Prescriptions  Medication Sig Dispense Refill  . albuterol (PROVENTIL HFA;VENTOLIN HFA) 108 (90 BASE) MCG/ACT inhaler Inhale 2 puffs into the lungs every 6 (six) hours as needed for wheezing or shortness of breath. 1 Inhaler 0  . atenolol (TENORMIN) 50 MG tablet Take 1 tablet (50 mg total) by mouth daily. 1 1/2 tablets Daily 135 tablet 1  . fluticasone (FLONASE) 50 MCG/ACT nasal spray Place 2 sprays into both nostrils daily. 48 g 0  . fluticasone-salmeterol (ADVAIR HFA) 115-21 MCG/ACT inhaler Inhale 1 puff into the lungs 2 (two) times daily.     . hydrochlorothiazide (HYDRODIURIL) 25 MG tablet Take 1 tablet (25 mg total) by mouth daily. 90 tablet 1  . losartan (COZAAR) 100 MG tablet Take 1 tablet (100 mg total) by mouth daily. 30 tablet 11  . Lutein-Zeaxanthin-Selenium 15-4.75-100 MG-MG-MCG CAPS Take 1 capsule by mouth every other day.    . Multiple Vitamins-Minerals (HAIR/SKIN/NAILS PO) Take 1 capsule by mouth every other day.    . Omega 3 1000 MG CAPS Take 1 capsule by mouth.     No current facility-administered medications for this visit.    Allergies  Allergen Reactions  . Avelox [Moxifloxacin Hcl In Nacl] Other (See Comments)    Chills  . Trazodone And Nefazodone Swelling     Family History  Problem Relation Age of Onset  . Breast cancer Mother   . Arthritis Maternal Grandmother   . Diabetes Maternal Grandmother     History   Social History  . Marital Status: Single    Spouse Name: N/A  . Number of Children: N/A  . Years of Education: N/A   Occupational History  . Not on file.   Social History Main Topics  . Smoking status: Never Smoker   . Smokeless tobacco: Never Used     Comment: quit over 50 years ago  . Alcohol Use: Yes     Comment: rare--liquor  . Drug Use: No  . Sexual Activity: Not Currently   Other Topics Concern  . Not on file   Social History Narrative     Constitutional: Denies fever, malaise, fatigue, headache or abrupt weight changes.  Respiratory: Denies difficulty breathing, shortness of breath, cough or sputum production.   Cardiovascular: Denies chest pain, chest tightness, palpitations or swelling in the hands or feet.  Skin: Pt reports swelling under right arm. Denies redness, rashes, lesions or ulcercations.   No other specific complaints in a complete review of systems (except as listed in HPI above).   Objective:   Physical Exam   BP 138/96 mmHg  Pulse 60  Temp(Src) 98.8 F (37.1 C) (Oral)  Wt 190 lb (86.183 kg)  SpO2 98% Wt Readings from Last 3 Encounters:  04/24/15 190 lb (86.183 kg)  02/21/15 188 lb 8 oz (85.503 kg)  02/09/15 190 lb (86.183 kg)    General: Appears her stated age, obese in NAD. Skin: Warm, dry and intact. No swelling or mass noted. Cardiovascular: Normal rate and rhythm. S1,S2 noted.  No murmur, rubs or gallops noted.  Pulmonary/Chest: Normal effort and positive vesicular breath sounds. No respiratory distress. No wheezes, rales or ronchi noted.  Musculoskeletal: Normal range of motion. No signs of joint swelling. No difficulty with gait.  Psychiatric: She seems very anxious today.  BMET    Component Value Date/Time   NA 140 02/21/2015 1236   K 4.3 02/21/2015 1236   CL  101 02/21/2015 1236   CO2 25 02/21/2015 1236   GLUCOSE 105* 02/21/2015 1236   BUN 14 02/21/2015 1236   CREATININE 0.95 02/21/2015 1236   CALCIUM 9.2 02/21/2015 1236   GFRNONAA 60 02/21/2015 1236   GFRAA 70 02/21/2015 1236         Assessment & Plan:   Swelling under armpits:  No mass noted Likely just subcutaneous fat that it asymmetric No intervention needed at this time  RTC as needed or if symptoms persist or worsen

## 2015-04-26 ENCOUNTER — Ambulatory Visit (INDEPENDENT_AMBULATORY_CARE_PROVIDER_SITE_OTHER): Payer: Medicare HMO | Admitting: Psychology

## 2015-04-26 DIAGNOSIS — F4323 Adjustment disorder with mixed anxiety and depressed mood: Secondary | ICD-10-CM | POA: Diagnosis not present

## 2015-04-28 ENCOUNTER — Telehealth: Payer: Self-pay | Admitting: Cardiovascular Disease

## 2015-04-28 ENCOUNTER — Telehealth: Payer: Self-pay

## 2015-04-28 ENCOUNTER — Ambulatory Visit (INDEPENDENT_AMBULATORY_CARE_PROVIDER_SITE_OTHER): Payer: Commercial Managed Care - HMO | Admitting: Internal Medicine

## 2015-04-28 NOTE — Telephone Encounter (Signed)
Patient advised.

## 2015-04-28 NOTE — Telephone Encounter (Signed)
The patient walked in to the office this afternoon for a BP check. She last saw Dr. Rockey Situ 02/21/15. She tells me today that she saw her PCP on Monday and her BP was high at that visit. She has been off of her HCTZ since March due to a syncopal event that she had in late [place] early February. The patient was started on losartan 100 mg 1/2 pill daily. She was instructed to monitor her BP and if her SBP went up above 140, she could take a whole losartan daily. She states she has not been checking this regularly and is not sure how long it has been up. She has been anxious since Monday when it was elevated at her PCP office. She has been checking her BP since that time. She will take her morning medications about 11:30 am. On 5/11 she increased the dose of losartan to a whole pill daily- BP's were 141/83 in the AM and 146/87 in the PM. On 5/12 her BP was 156/88 and 146/79. She only took a 1/2 tablet of losartan this morning. At the present time, her BP is 150/72 HR- 60. She denies any change in symptoms, but may have felt a little more fatigue lately. Reviewed with Dr. Rockey Situ- increase losartan to 100 daily. She may take losartan in the AM and atenolol in the PM to see if this will give her better coverage for her BP. I have also advised she may take losartan 50 mg BID- whichever seems to work better for her. Per Dr. Rockey Situ, she may also take her HCTZ 12.5 mg once every other day if her SBP continues to run above 140. I have instructed her to take her BP no more than twice daily and that this should be done about 30-60 minutes after she takes her medications. She will monitor this for 1 week and call us with the readings. She verbalizes understanding of the above.

## 2015-04-28 NOTE — Telephone Encounter (Signed)
agreed

## 2015-04-28 NOTE — Progress Notes (Signed)
   Subjective:    Patient ID: Molly Faulkner, female    DOB: 1941/10/17, 74 y.o.   MRN: [id number]  HPI  Erroneous encounter  Review of Systems    Objective:   Physical Exam        Assessment & Plan:

## 2015-04-28 NOTE — Telephone Encounter (Signed)
If this has been going on chronically and she doesn't have emergent sx, then I wouldn't change anything at this point.  I would just keep the OV as scheduled.  Thanks.

## 2015-04-28 NOTE — Telephone Encounter (Signed)
Pt left v/m; pt has appt 04/28/15 at 3:15 to see Webb Silversmith NP about elevated BP. BP at 9 AM this morning was 161/83. BP med was changed bout 1 1/2 months ago and pt thinks since med changed BP has remained high most of the time.when pt takes BP med the BP does go down but goes back up in 12 hours. Pt wants to know if OK to wait until seen today at 3:15 to take any BP med; pt thinks if takes BP med now her BP will not be elevated when she comes in for appt. Please advise. Webb Silversmith NP is out of office this morning; will send to Webb Silversmith NP and Dr Damita Dunnings who is in the office.

## 2015-05-01 ENCOUNTER — Other Ambulatory Visit: Payer: Self-pay | Admitting: *Deleted

## 2015-05-01 MED ORDER — FLUTICASONE-SALMETEROL 115-21 MCG/ACT IN AERO: 1.0000 | INHALATION_SPRAY | Freq: Two times a day (BID) | RESPIRATORY_TRACT | Status: DC

## 2015-05-01 NOTE — Telephone Encounter (Signed)
Patient left a voicemail stating that she needs a new script sent to the pharmacy for Advair. Patient stated that this was prescribed by her previous doctor.

## 2015-05-05 ENCOUNTER — Telehealth: Payer: Self-pay

## 2015-05-05 NOTE — Telephone Encounter (Signed)
Please see previous phone note.  

## 2015-05-05 NOTE — Telephone Encounter (Signed)
Pt called back to leave cell #, states she will be going out, and asks to call her cell.

## 2015-05-05 NOTE — Telephone Encounter (Signed)
Left message for pt to call back  °

## 2015-05-05 NOTE — Telephone Encounter (Signed)
Pt states her BP has not gone down since she has taken her medication. 5/20-  9 am 177/91 5/19- after evening meds 165/90 5/19 - After morning meds 138/80 5/18-after evening meds 152/87 5/18 after morning meds 161/86

## 2015-05-05 NOTE — Telephone Encounter (Signed)
Spoke w/ pt.  Advised her of Dr. Donivan Scull recommendation.  She verbalizes understanding and will call back w/ more BP readings if they remain elevated.

## 2015-05-05 NOTE — Telephone Encounter (Signed)
Blain Pais at 05/05/2015 9:22 AM     Status: Signed       Expand All Collapse All   Pt states her BP has not gone down since she has taken her medication. 5/20- 9 am 177/91 5/19- after evening meds 165/90 5/19 - After morning meds 138/80 5/18-after evening meds 152/87 5/18 after morning meds 161/86

## 2015-05-05 NOTE — Telephone Encounter (Signed)
She want to take 1/2  HCTZ daily or try an alternate medication?

## 2015-05-12 ENCOUNTER — Telehealth: Payer: Self-pay | Admitting: *Deleted

## 2015-05-12 NOTE — Telephone Encounter (Signed)
Pt c/o BP issue: STAT if pt c/o blurred vision, one-sided weakness or slurred speech  1. What are your last 5 BP readings?  05/12/15 136/84 05/11/15 131/84 05/08/15 133/75 05/07/15 138/82 Sunday evening  05/07/15 134/73 Sunday morning  2. Are you having any other symptoms (ex. Dizziness, headache, blurred vision, passed out)? No, just a bit of dizzy ness.   3. What is your BP issue? Is worried that is may be a bit high, and just wants to talk to someone before the holiday weekend start. To make sure she is okay.  Please call patient.

## 2015-05-12 NOTE — Telephone Encounter (Signed)
Attempted to contact pt. No answer, no machine.  

## 2015-05-23 ENCOUNTER — Ambulatory Visit (INDEPENDENT_AMBULATORY_CARE_PROVIDER_SITE_OTHER): Payer: Medicare HMO | Admitting: Psychology

## 2015-05-23 ENCOUNTER — Other Ambulatory Visit: Payer: Self-pay | Admitting: *Deleted

## 2015-05-23 DIAGNOSIS — F4323 Adjustment disorder with mixed anxiety and depressed mood: Secondary | ICD-10-CM

## 2015-05-23 MED ORDER — LOSARTAN POTASSIUM 50 MG PO TABS: 50.0000 mg | ORAL_TABLET | Freq: Two times a day (BID) | ORAL | Status: DC

## 2015-05-23 NOTE — Telephone Encounter (Signed)
Spoke with the patient regarding the losartan refill and she wanted the Rx to read Losartan 50 mg take one tablet twice a day with 180 tablets and 3 refills sent to [facility].

## 2015-05-23 NOTE — Telephone Encounter (Signed)
°  1. Which medications need to be refilled? losatran 50 mg, taking them two times a day  2. Which pharmacy is medication to be sent to? [facility]   3. Do they need a 30 day or 90 day supply? 90   4. Would they like a call back once the medication has been sent to the pharmacy? No

## 2015-06-06 ENCOUNTER — Ambulatory Visit (INDEPENDENT_AMBULATORY_CARE_PROVIDER_SITE_OTHER): Payer: Commercial Managed Care - HMO | Admitting: Internal Medicine

## 2015-06-06 ENCOUNTER — Encounter: Payer: Self-pay | Admitting: Internal Medicine

## 2015-06-06 VITALS — BP 132/78 | HR 58 | Temp 97.8°F | Wt 189.8 lb

## 2015-06-06 DIAGNOSIS — Z1239 Encounter for other screening for malignant neoplasm of breast: Secondary | ICD-10-CM | POA: Diagnosis not present

## 2015-06-06 DIAGNOSIS — M7521 Bicipital tendinitis, right shoulder: Secondary | ICD-10-CM

## 2015-06-06 NOTE — Progress Notes (Signed)
Subjective:    Patient ID: Molly Faulkner, female    DOB: 1941-10-07, 74 y.o.   MRN: [id number]  HPI  Pt reports right arm pain. She reports this started back in January. It is worse with certain movements. The pain is intermittent. She has not noticed any weakness, numbness or tingling in the right arm. She has tried Infrared heat wit hsome relief. She also uses a pain relief cream with some relief.  She would also like me to schedule her for her mammogram. She has not had once since 2014.  Review of Systems      Past Medical History  Diagnosis Date  . Asthma   . Arthritis   . Chicken pox   . Depression   . Diverticulitis   . GERD (gastroesophageal reflux disease)   . Allergy   . Hypertension   . Hyperlipidemia   . Hx of colonic polyps   . 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 Insomnia     Current Outpatient Prescriptions  Medication Sig Dispense Refill  . albuterol (PROVENTIL HFA;VENTOLIN HFA) 108 (90 BASE) MCG/ACT inhaler Inhale 2 puffs into the lungs every 6 (six) hours as needed for wheezing or shortness of breath. 1 Inhaler 0  . atenolol (TENORMIN) 50 MG tablet Take 1 tablet (50 mg total) by mouth daily. 1 1/2 tablets Daily 135 tablet 1  . fluticasone (FLONASE) 50 MCG/ACT nasal spray Place 2 sprays into both nostrils daily. 48 g 0  . fluticasone-salmeterol (ADVAIR HFA) 115-21 MCG/ACT inhaler Inhale 1 puff into the lungs 2 (two) times daily. 1 Inhaler 2  . hydrochlorothiazide (HYDRODIURIL) 25 MG tablet Take 1/2 tablet by mouth once every other day if SBP remains >140    . losartan (COZAAR) 50 MG tablet Take 1 tablet (50 mg total) by mouth 2 (two) times daily. 180 tablet 3  . Lutein-Zeaxanthin-Selenium 15-4.75-100 MG-MG-MCG CAPS Take 1 capsule by mouth every other day.    . Multiple Vitamins-Minerals (HAIR/SKIN/NAILS PO) Take 1 capsule by mouth every other day.    . Omega 3 1000 MG CAPS Take 1 capsule by mouth.     No current facility-administered medications for this visit.     Allergies  Allergen Reactions  . Avelox [Moxifloxacin Hcl In Nacl] Other (See Comments)    Chills  . Trazodone And Nefazodone Swelling    Family History  Problem Relation Age of Onset  . Breast cancer Mother   . Arthritis Maternal Grandmother   . Diabetes Maternal Grandmother     History   Social History  . Marital Status: Single    Spouse Name: N/A  . Number of Children: N/A  . Years of Education: N/A   Occupational History  . Not on file.   Social History Main Topics  . Smoking status: Never Smoker   . Smokeless tobacco: Never Used     Comment: quit over 50 years ago  . Alcohol Use: Yes     Comment: rare--liquor  . Drug Use: No  . Sexual Activity: Not Currently   Other Topics Concern  . Not on file   Social History Narrative     Constitutional: Denies fever, malaise, fatigue, headache or abrupt weight changes.  Respiratory: Denies difficulty breathing, shortness of breath, cough or sputum production.   Cardiovascular: Denies chest pain, chest tightness, palpitations or swelling in the hands or feet.  Musculoskeletal: Pt reports right arm pain. Denies decrease in range of motion, difficulty with gait, muscle pain or joint swelling.  Skin: Denies redness, rashes, lesions or ulcercations.  Neurological: Denies numbness or tingling in the hands or problems with balance and coordination.   No other specific complaints in a complete review of systems (except as listed in HPI above).  Objective:   Physical Exam  BP 132/78 mmHg  Pulse 58  Temp(Src) 97.8 F (36.6 C) (Oral)  Wt 189 lb 12.8 oz (86.093 kg)  SpO2 97% Wt Readings from Last 3 Encounters:  06/06/15 189 lb 12.8 oz (86.093 kg)  04/24/15 190 lb (86.183 kg)  02/21/15 188 lb 8 oz (85.503 kg)    General: Appears her stated age, well developed, well nourished in NAD.  Cardiovascular: Normal rate and rhythm. S1,S2 noted.  No murmur, rubs or gallops noted. . Pulmonary/Chest: Normal effort and  positive vesicular breath sounds. No respiratory distress. No wheezes, rales or ronchi noted.  Musculoskeletal: Normal internal and external ROM of the shoulders. No pain with palpation of the right shoulder. Pain with palpation over the right biceps tendon. Strength 5/5 BUE.  Neurological: Alert and oriented. Sensation intact to BUE. +DTRs bilaterally.   BMET    Component Value Date/Time   NA 140 02/21/2015 1236   K 4.3 02/21/2015 1236   CL 101 02/21/2015 1236   CO2 25 02/21/2015 1236   GLUCOSE 105* 02/21/2015 1236   BUN 14 02/21/2015 1236   CREATININE 0.95 02/21/2015 1236   CALCIUM 9.2 02/21/2015 1236   GFRNONAA 60 02/21/2015 1236   GFRAA 70 02/21/2015 1236    Lipid Panel  No results found for: CHOL, TRIG, HDL, CHOLHDL, VLDL, LDLCALC  CBC No results found for: WBC, RBC, HGB, HCT, PLT, MCV, MCH, MCHC, RDW, LYMPHSABS, MONOABS, EOSABS, BASOSABS  Hgb A1C No results found for: HGBA1C       Assessment & Plan:   Biceps tendonitis.:  Unable to take NSAID's as she has had bad reactions in the past Continue heat rub Avoid overuse- I know this is difficulty because this is your dominant hand Ice may be helpful  Screening for breast cancer:  Mammogram ordered today She will call [place] to schedule  RTC as needed or if symptoms persist or worsen

## 2015-06-06 NOTE — Progress Notes (Signed)
Pre visit review using our clinic review tool, if applicable. No additional management support is needed unless otherwise documented below in the visit note. 

## 2015-06-06 NOTE — Patient Instructions (Signed)
Bicipital Tendonitis Bicipital tendonitis refers to redness, soreness, and swelling (inflammation) or irritation of the bicep tendon. The biceps muscle is located between the elbow and shoulder of the inner arm. The tendon heads, similar to pieces of rope, connect the bicep muscle to the shoulder socket. They are called short head and long head tendons. When tendonitis occurs, the long head tendon is inflamed and swollen, and may be thickened or partially torn.  Bicipital tendonitis can occur with other problems as well, such as arthritis in the shoulder or acromioclavicular joints, tears in the tendons, or other rotator cuff problems.  CAUSES  Overuse of of the arms for overhead activities is the major cause of tendonitis. Many athletes, such as swimmers, baseball players, and tennis players are prone to bicipital tendonitis. Jobs that require manual labor or routine chores, especially chores involving overhead activities can result in overuse and tendonitis. SYMPTOMS Symptoms may include:  Pain in and around the front of the shoulder. Pain may be worse with overhead motion.  Pain or aching that radiates down the arm.  Clicking or shifting sensations in the shoulder. DIAGNOSIS Your caregiver may perform the following:  Physical exam and tests of the biceps and shoulder to observe range of motion, strength, and stability.  X-rays or magnetic resonance imaging (MRI) to confirm the diagnosis. In most common cases, these tests are not necessary. Since other problems may exist in the shoulder or rotator cuff, additional tests may be recommended. TREATMENT Treatment may include the following:  Medications  Your caregiver may prescribe over-the-counter pain relievers.  Steroid injections, such as cortisone, may be recommended. These may help to reduce inflammation and pain.  Physical Therapy - Your caregiver may recommend gentle exercises with the arm. These can help restore strength and range  of motion. They may be done at home or with a physical therapist's supervision and input.  Surgery - Arthroscopic or open surgery sometimes is necessary. Surgery may include:  Reattachment or repair of the tendon at the shoulder socket.  Removal of the damaged section of the tendon.  Anchoring the tendon to a different area of the shoulder (tenodesis). HOME CARE INSTRUCTIONS   Avoid overhead motion of the affected arm or any other motion that causes pain.  Take medication for pain as directed. Do not take these for more than 3 weeks, unless directed to do so by your caregiver.  Ice the affected area for 20 minutes at a time, 3-4 times per day. Place a towel on the skin over the painful area and the ice or cold pack over the towel. Do not place ice directly on the skin.  Perform gentle exercises at home as directed. These will increase strength and flexibility. PREVENTION  Modify your activities as much as possible to protect your arm. A physical therapist or sports medicine physician can help you understand options for safe motion.  Avoid repetitive overhead pulling, lifting, reaching, and throwing until your caregiver tells you it is ok to resume these activities. SEEK MEDICAL CARE IF:  Your pain worsens.  You have difficulty moving the affected arm.  You have trouble performing any of the self-care instructions. MAKE SURE YOU:   Understand these instructions.  Will watch your condition.  Will get help right away if you are not doing well or get worse. Document Released: 01/04/2011 Document Revised: 02/24/2012 Document Reviewed: 01/04/2011 Exit[employer] Patient Information 2015 [employer]. This information is not intended to replace advice given to you by your health care provider.   Make sure you discuss any questions you have with your health care provider.  

## 2015-06-09 ENCOUNTER — Other Ambulatory Visit: Payer: Self-pay

## 2015-06-09 MED ORDER — ATENOLOL 50 MG PO TABS: 50.0000 mg | ORAL_TABLET | Freq: Every day | ORAL | Status: DC

## 2015-06-09 MED ORDER — HYDROCHLOROTHIAZIDE 25 MG PO TABS: 12.5000 mg | ORAL_TABLET | Freq: Every day | ORAL | Status: DC

## 2015-06-12 ENCOUNTER — Other Ambulatory Visit: Payer: Self-pay | Admitting: *Deleted

## 2015-06-16 DIAGNOSIS — M62838 Other muscle spasm: Secondary | ICD-10-CM | POA: Diagnosis not present

## 2015-06-16 DIAGNOSIS — M546 Pain in thoracic spine: Secondary | ICD-10-CM | POA: Diagnosis not present

## 2015-06-16 DIAGNOSIS — M9903 Segmental and somatic dysfunction of lumbar region: Secondary | ICD-10-CM | POA: Diagnosis not present

## 2015-06-16 DIAGNOSIS — M542 Cervicalgia: Secondary | ICD-10-CM | POA: Diagnosis not present

## 2015-06-16 DIAGNOSIS — M9902 Segmental and somatic dysfunction of thoracic region: Secondary | ICD-10-CM | POA: Diagnosis not present

## 2015-06-16 DIAGNOSIS — M25559 Pain in unspecified hip: Secondary | ICD-10-CM | POA: Diagnosis not present

## 2015-06-16 DIAGNOSIS — M9901 Segmental and somatic dysfunction of cervical region: Secondary | ICD-10-CM | POA: Diagnosis not present

## 2015-06-20 ENCOUNTER — Ambulatory Visit (INDEPENDENT_AMBULATORY_CARE_PROVIDER_SITE_OTHER): Payer: Medicare HMO | Admitting: Psychology

## 2015-06-20 DIAGNOSIS — F4323 Adjustment disorder with mixed anxiety and depressed mood: Secondary | ICD-10-CM

## 2015-06-29 ENCOUNTER — Ambulatory Visit: Payer: Commercial Managed Care - HMO | Admitting: Psychology

## 2015-06-30 ENCOUNTER — Ambulatory Visit: Payer: Commercial Managed Care - HMO

## 2015-07-04 ENCOUNTER — Ambulatory Visit: Payer: Commercial Managed Care - HMO

## 2015-07-07 DIAGNOSIS — M62838 Other muscle spasm: Secondary | ICD-10-CM | POA: Diagnosis not present

## 2015-07-07 DIAGNOSIS — M9901 Segmental and somatic dysfunction of cervical region: Secondary | ICD-10-CM | POA: Diagnosis not present

## 2015-07-07 DIAGNOSIS — M546 Pain in thoracic spine: Secondary | ICD-10-CM | POA: Diagnosis not present

## 2015-07-07 DIAGNOSIS — M9903 Segmental and somatic dysfunction of lumbar region: Secondary | ICD-10-CM | POA: Diagnosis not present

## 2015-07-07 DIAGNOSIS — M25559 Pain in unspecified hip: Secondary | ICD-10-CM | POA: Diagnosis not present

## 2015-07-07 DIAGNOSIS — M9902 Segmental and somatic dysfunction of thoracic region: Secondary | ICD-10-CM | POA: Diagnosis not present

## 2015-07-07 DIAGNOSIS — M542 Cervicalgia: Secondary | ICD-10-CM | POA: Diagnosis not present

## 2015-07-13 ENCOUNTER — Ambulatory Visit: Payer: Commercial Managed Care - HMO | Admitting: Psychology

## 2015-07-26 ENCOUNTER — Ambulatory Visit (INDEPENDENT_AMBULATORY_CARE_PROVIDER_SITE_OTHER): Payer: Medicare HMO | Admitting: Psychology

## 2015-07-26 DIAGNOSIS — F4322 Adjustment disorder with anxiety: Secondary | ICD-10-CM | POA: Diagnosis not present

## 2015-08-09 ENCOUNTER — Ambulatory Visit: Payer: Commercial Managed Care - HMO | Admitting: Psychology

## 2015-08-11 ENCOUNTER — Telehealth: Payer: Self-pay | Admitting: *Deleted

## 2015-08-11 DIAGNOSIS — M546 Pain in thoracic spine: Secondary | ICD-10-CM | POA: Diagnosis not present

## 2015-08-11 DIAGNOSIS — M9902 Segmental and somatic dysfunction of thoracic region: Secondary | ICD-10-CM | POA: Diagnosis not present

## 2015-08-11 DIAGNOSIS — M62838 Other muscle spasm: Secondary | ICD-10-CM | POA: Diagnosis not present

## 2015-08-11 DIAGNOSIS — M542 Cervicalgia: Secondary | ICD-10-CM | POA: Diagnosis not present

## 2015-08-11 DIAGNOSIS — M9901 Segmental and somatic dysfunction of cervical region: Secondary | ICD-10-CM | POA: Diagnosis not present

## 2015-08-11 DIAGNOSIS — M9903 Segmental and somatic dysfunction of lumbar region: Secondary | ICD-10-CM | POA: Diagnosis not present

## 2015-08-11 DIAGNOSIS — M25559 Pain in unspecified hip: Secondary | ICD-10-CM | POA: Diagnosis not present

## 2015-08-11 NOTE — Telephone Encounter (Signed)
Pt calling stating she would like to know some guidelines on BP, for her is running a bit higher than it used to be.  Pt c/o BP issue: STAT if pt c/o blurred vision, one-sided weakness or slurred speech  1. What are your last 5 BP readings?  9 am 140/86 08/09/15 152/82 am  08/07/15 163/77 am                140/80 pm 06/26/15 140/78 am  AM readings are all before she took her medication and the PM is after she took her medication.  2. Are you having any other symptoms (ex. Dizziness, headache, blurred vision, passed out)?  Just some intermittent light headedness  3. What is your BP issue?  BP is running higher than usual. This has been going ever since she started her new medication that we gave her.   Also made pt and apt to see [place], oct 14th with Doctor.  Pt also stating that we can lmov.

## 2015-08-11 NOTE — Telephone Encounter (Signed)
Attempted to contact pt.   A female answered the phone but hung up on me when I asked for pt.

## 2015-08-14 NOTE — Telephone Encounter (Signed)
Spoke w/ pt. She reports that her BP has been running high.  She is taking 1/2 HCTZ and would like to know exactly where her #s are supposed to be.  Offered her appt to see Ryan on 08/17/15 @ 1:30. She is agreeable to coming in to speak w/ him and see if any med changes need to be done.

## 2015-08-17 ENCOUNTER — Ambulatory Visit: Payer: Self-pay | Admitting: Physician Assistant

## 2015-08-22 ENCOUNTER — Ambulatory Visit (INDEPENDENT_AMBULATORY_CARE_PROVIDER_SITE_OTHER): Payer: Medicare HMO | Admitting: Psychology

## 2015-08-22 DIAGNOSIS — F4322 Adjustment disorder with anxiety: Secondary | ICD-10-CM | POA: Diagnosis not present

## 2015-08-23 ENCOUNTER — Ambulatory Visit (INDEPENDENT_AMBULATORY_CARE_PROVIDER_SITE_OTHER): Payer: Commercial Managed Care - HMO

## 2015-08-23 DIAGNOSIS — Z23 Encounter for immunization: Secondary | ICD-10-CM | POA: Diagnosis not present

## 2015-08-24 ENCOUNTER — Ambulatory Visit: Payer: Self-pay

## 2015-08-25 ENCOUNTER — Telehealth: Payer: Self-pay

## 2015-08-25 NOTE — Telephone Encounter (Signed)
Pt wanted refill transferred from [facility] to [place]; pt will contact [facility] and request refill transferred from [place] pt voiced understanding.

## 2015-09-06 ENCOUNTER — Encounter: Payer: Self-pay | Admitting: Internal Medicine

## 2015-09-06 ENCOUNTER — Ambulatory Visit (INDEPENDENT_AMBULATORY_CARE_PROVIDER_SITE_OTHER): Payer: Commercial Managed Care - HMO | Admitting: Internal Medicine

## 2015-09-06 VITALS — BP 122/78 | HR 62 | Temp 99.0°F | Wt 189.0 lb

## 2015-09-06 DIAGNOSIS — M205X9 Other deformities of toe(s) (acquired), unspecified foot: Secondary | ICD-10-CM

## 2015-09-06 DIAGNOSIS — M2040 Other hammer toe(s) (acquired), unspecified foot: Secondary | ICD-10-CM | POA: Diagnosis not present

## 2015-09-06 DIAGNOSIS — M79645 Pain in left finger(s): Secondary | ICD-10-CM

## 2015-09-06 NOTE — Progress Notes (Signed)
Subjective:    Patient ID: Molly Faulkner, female    DOB: June 21, 1941, 74 y.o.   MRN: [id number]  HPI  Pt presents to the clinic today with c/o hammer toes affecting the 2nd and 3rd toes bilaterally. This causes pain at times. She has not noticed any toe swelling. She denies numbness and tingling.  She has been using OTC toe spacers with some relief. She has been stretching her toes. She wants to know if anything else can be done.  She also c/o stiffness and swelling of her index finger on her left hand. She noticed this 6 weeks ago. She has trouble bending it at times. She wakes up in the morning and has physically had to straighten her finger out. She denis numbness and tingling in the left finger. She has used a topical cream OTC and ice with some relief. She has had issues like this in the past in other fingers and reports she has had to have injections. She reports she is unable to take NSAIDs because she has had negative heart effects from it in the past.  Review of Systems      Past Medical History  Diagnosis Date  . Asthma   . Arthritis   . Chicken pox   . Depression   . Diverticulitis   . GERD (gastroesophageal reflux disease)   . Allergy   . Hypertension   . Hyperlipidemia   . Hx of colonic polyps   . 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 Insomnia     Current Outpatient Prescriptions  Medication Sig Dispense Refill  . albuterol (PROVENTIL HFA;VENTOLIN HFA) 108 (90 BASE) MCG/ACT inhaler Inhale 2 puffs into the lungs every 6 (six) hours as needed for wheezing or shortness of breath. 1 Inhaler 0  . atenolol (TENORMIN) 50 MG tablet Take 1 tablet (50 mg total) by mouth daily. 1 1/2 tablets Daily 135 tablet 1  . fluticasone (FLONASE) 50 MCG/ACT nasal spray Place 2 sprays into both nostrils daily. 48 g 0  . fluticasone-salmeterol (ADVAIR HFA) 115-21 MCG/ACT inhaler Inhale 1 puff into the lungs 2 (two) times daily. 1 Inhaler 2  . hydrochlorothiazide (HYDRODIURIL) 25 MG tablet Take 0.5-1  tablets (12.5-25 mg total) by mouth daily. 90 tablet 1  . losartan (COZAAR) 50 MG tablet Take 1 tablet (50 mg total) by mouth 2 (two) times daily. 180 tablet 3  . Lutein-Zeaxanthin-Selenium 15-4.75-100 MG-MG-MCG CAPS Take 1 capsule by mouth daily.     . Multiple Vitamins-Minerals (HAIR/SKIN/NAILS PO) Take 1 capsule by mouth every other day.    . Omega 3 1000 MG CAPS Take 1 capsule by mouth.     No current facility-administered medications for this visit.    Allergies  Allergen Reactions  . Avelox [Moxifloxacin Hcl In Nacl] Other (See Comments)    Chills  . Trazodone And Nefazodone Swelling    Family History  Problem Relation Age of Onset  . Breast cancer Mother   . Arthritis Maternal Grandmother   . Diabetes Maternal Grandmother     Social History   Social History  . Marital Status: Single    Spouse Name: N/A  . Number of Children: N/A  . Years of Education: N/A   Occupational History  . Not on file.   Social History Main Topics  . Smoking status: Never Smoker   . Smokeless tobacco: Never Used     Comment: quit over 50 years ago  . Alcohol Use: Yes     Comment: rare--liquor  .  Drug Use: No  . Sexual Activity: Not Currently   Other Topics Concern  . Not on file   Social History Narrative     Constitutional: Denies fever, malaise, fatigue, headache or abrupt weight changes.  Respiratory: Denies difficulty breathing, shortness of breath, cough or sputum production.   Cardiovascular: Denies chest pain, chest tightness, palpitations or swelling in the hands or feet.  Musculoskeletal: Pt reports finger pain and toe pain. Denies difficulty with gait, muscle pain or joint swelling.  Skin: Denies redness, rashes, lesions or ulcercations.   No other specific complaints in a complete review of systems (except as listed in HPI above).  Objective:   Physical Exam   BP 122/78 mmHg  Pulse 62  Temp(Src) 99 F (37.2 C) (Oral)  Wt 189 lb (85.73 kg)  SpO2 98% Wt  Readings from Last 3 Encounters:  09/06/15 189 lb (85.73 kg)  06/06/15 189 lb 12.8 oz (86.093 kg)  04/24/15 190 lb (86.183 kg)    General: Appears her stated age, well developed, well nourished in NAD. Skin: Warm, dry and intact. No rashes, lesions or ulcerations noted. Cardiovascular: Normal rate and rhythm. S1,S2 noted.   Pulmonary/Chest: Normal effort and positive vesicular breath sounds. No respiratory distress. No wheezes, rales or ronchi noted.  Musculoskeletal: She has mallet toe of the 2nd toe bilaterally. She has noted swelling of the left index finger. She has pain with palpation over the flexor tendon.  BMET    Component Value Date/Time   NA 140 02/21/2015 1236   K 4.3 02/21/2015 1236   CL 101 02/21/2015 1236   CO2 25 02/21/2015 1236   GLUCOSE 105* 02/21/2015 1236   BUN 14 02/21/2015 1236   CREATININE 0.95 02/21/2015 1236   CALCIUM 9.2 02/21/2015 1236   GFRNONAA 60 02/21/2015 1236   GFRAA 70 02/21/2015 1236    Lipid Panel  No results found for: CHOL, TRIG, HDL, CHOLHDL, VLDL, LDLCALC  CBC No results found for: WBC, RBC, HGB, HCT, PLT, MCV, MCH, MCHC, RDW, LYMPHSABS, MONOABS, EOSABS, BASOSABS  Hgb A1C No results found for: HGBA1C      Assessment & Plan:   Index finger pain, left:  Advised her it is okay to take Aleve prn, not daily Continue ice and OTC icy hot cream She will make an appt with Dr. Lorelei Pont for further evaluation  Mallet Toes:  Mild No further intervention needed at this time Will continue to monitor  RTC as needed or if symptoms persist or worsen

## 2015-09-06 NOTE — Patient Instructions (Signed)

## 2015-09-06 NOTE — Progress Notes (Signed)
Pre visit review using our clinic review tool, if applicable. No additional management support is needed unless otherwise documented below in the visit note. 

## 2015-09-14 DIAGNOSIS — H2513 Age-related nuclear cataract, bilateral: Secondary | ICD-10-CM | POA: Diagnosis not present

## 2015-09-18 ENCOUNTER — Ambulatory Visit (INDEPENDENT_AMBULATORY_CARE_PROVIDER_SITE_OTHER): Payer: Commercial Managed Care - HMO | Admitting: Family Medicine

## 2015-09-18 ENCOUNTER — Encounter: Payer: Self-pay | Admitting: Family Medicine

## 2015-09-18 VITALS — BP 114/70 | HR 55 | Temp 98.6°F | Ht 61.0 in | Wt 190.5 lb

## 2015-09-18 DIAGNOSIS — M65322 Trigger finger, left index finger: Secondary | ICD-10-CM | POA: Diagnosis not present

## 2015-09-18 DIAGNOSIS — M7581 Other shoulder lesions, right shoulder: Secondary | ICD-10-CM

## 2015-09-18 MED ORDER — METHYLPREDNISOLONE ACETATE 40 MG/ML IJ SUSP
20.0000 mg | Freq: Once | INTRAMUSCULAR | Status: AC
  Administered 2015-09-18: 20 mg via INTRA_ARTICULAR

## 2015-09-18 NOTE — Progress Notes (Signed)
Pre visit review using our clinic review tool, if applicable. No additional management support is needed unless otherwise documented below in the visit note. 

## 2015-09-18 NOTE — Progress Notes (Signed)
Dr. Frederico Hamman T. Cornellius Kropp, MD, [facility] [place], [address] Phone: [phone] Fax: [phone]  09/18/2015  Patient: Molly Faulkner, MRN: [id number], DOB: 10/30/1941, 74 y.o.  Primary Physician:  Webb Silversmith, NP  Chief Complaint: Trigger Finger and Arm Pain  Subjective:   This 74 y.o. female patient noted above presents with shoulder pain that has been ongoing for > 6 mo and L trigger finger on 2nd, ongoing x 1 year, worse for 5 weeks. there is no history of trauma or accident recently The patient denies neck pain or radicular symptoms. Denies dislocation, subluxation, separation of the shoulder. The patient does complain of pain in the overhead plane with significant painful arc of motion.  Medications Tried: Tylenol, NSAIDS Ice or Heat: minimally helpful Tried PT: No  Prior shoulder Injury: No Prior surgery: No Prior fracture: No  The PMH, PSH, Social History, Family History, Medications, and allergies have been reviewed in [facility], and have been updated if relevant.  Patient Active Problem List   Diagnosis Date Noted  . Syncope 02/21/2015  . Tachycardia 02/21/2015  . Hyperlipidemia 02/21/2015  . Insomnia 07/25/2014  . HTN (hypertension) 07/25/2014  . GERD 07/25/2014  . Depression 07/25/2014  . Asthma, moderate persistent, well-controlled 07/25/2014    Past Medical History  Diagnosis Date  . Asthma   . Arthritis   . Chicken pox   . Depression   . Diverticulitis   . GERD (gastroesophageal reflux disease)   . Allergy   . Hypertension   . Hyperlipidemia   . Hx of colonic polyps   . 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 Insomnia     Past Surgical History  Procedure Laterality Date  . Breast biopsy Bilateral [id number]  . Abdominal hysterectomy  1994    total  . Carpal tunnel release Bilateral     Social History   Social History  . Marital Status: Single    Spouse Name: N/A  . Number of Children: N/A    . Years of Education: N/A   Occupational History  . Not on file.   Social History Main Topics  . Smoking status: Never Smoker   . Smokeless tobacco: Never Used     Comment: quit over 50 years ago  . Alcohol Use: Yes     Comment: rare--liquor  . Drug Use: No  . Sexual Activity: Not Currently   Other Topics Concern  . Not on file   Social History Narrative    Family History  Problem Relation Age of Onset  . Breast cancer Mother   . Arthritis Maternal Grandmother   . Diabetes Maternal Grandmother     Allergies  Allergen Reactions  . Avelox [Moxifloxacin Hcl In Nacl] Other (See Comments)    Chills  . Clarithromycin   . Trazodone And Nefazodone Swelling    Medication list reviewed and updated in full in [facility].  GEN: No fevers, chills. Nontoxic. Primarily MSK c/o today. MSK: Detailed in the HPI GI: tolerating PO intake without difficulty Neuro: No numbness, parasthesias, or tingling associated. Otherwise the pertinent positives of the ROS are noted above.   Objective:   Blood pressure 114/70, pulse 55, temperature 98.6 F (37 C), temperature source Oral, height 5\' 1"  (1.549 m), weight 190 lb 8 oz (86.41 kg).  GEN: Well-developed,well-nourished,in no acute distress; alert,appropriate and cooperative throughout examination HEENT: Normocephalic and atraumatic without obvious abnormalities. Ears, externally no deformities PULM: Breathing comfortably in no  respiratory distress EXT: No clubbing, cyanosis, or edema PSYCH: Normally interactive. Cooperative during the interview. Pleasant. Friendly and conversant. Not anxious or depressed appearing. Normal, full affect.  L hand Ecchymosis or edema: neg ROM wrist/hand/digits: full  Carpals, MCP's, digits: NT Distal Ulna and Radius: NT No instability Digit triggering: L 2nd flexor, some swelling Finkelstein's test: neg Snuffbox tenderness: neg Scaphoid tubercle: NT Resisted supination: NT Full composite  fist, no malrotation Grip, all digits: 5/5 str DIPJT: NT PIP JT: NT MCP JT: NT Axial load test: neg Phalen's: neg Tinel's: neg Atrophy: neg  Hand sensation: intact   Shoulder: R Inspection: No muscle wasting or winging Ecchymosis/edema: neg  AC joint, scapula, clavicle: NT Cervical spine: NT, full ROM Spurling's: neg Abduction: full, 5/5 Flexion: full, 5/5 IR, full, lift-off: 5/5 ER at neutral: full, 5/5 AC crossover: neg Neer: pos Hawkins: pos Drop Test: neg Empty Can: pos Supraspinatus insertion: mild-mod T Bicipital groove: NT Speed's: neg Yergason's: neg Sulcus sign: neg Scapular dyskinesis: none C5-T1 intact  Neuro: Sensation intact Grip 5/5   Radiology: No results found.  Assessment and Plan:    Trigger index finger, left - Plan: methylPREDNISolone acetate (DEPO-MEDROL) injection 20 mg  Right rotator cuff tendinitis  Using an anatomical model, I reviewed with the patent the structures involved and how they related to their diagnosis .  We discussed the pathophysiology of trigger fingers. Discussed the inflammatory nature of nodule creation and likely nodule abutting the A1 pulley system, this causing the patient's discomfort and sensations. We discussed that treatments for this include direct injection into the tendon sheath to attempt to shrink catching tissue. This can be done 1-2 times. Other treatments include surgical release. If the patient fails to trigger finger injections, I would recommend trigger finger release if the patient desires relief of the symptoms.   Trigger Finger Injection, L 2nd digit Verbal consent was obtained. Risks (including rare risk of infection, potential risk for skin lightening and potential atrophy), benefits and alternatives were discussed. Prepped with Chloraprep and Ethyl Chloride used for anesthesia. Under sterile conditions, patient injected at palmar crease aiming distally with 45 degree angle towards nodule; injected  directly into tendon sheath. Medication flowed freely without resistance.  Needle size: 22 gauge 1 1/2 inch Injection: 1/2 cc of Lidocaine 1% and Depo-Medrol 20 mg   Rotator cuff strengthening and scapular stabilization exercises were reviewed with the patient. MOON shoulder protocol given to the patient. Retraining shoulder mechanics and function was emphasized to the patient with rehab done at least 5-6 days a week.  Follow-up: prn  Signed,  Deriana Vanderhoef T. Keilee Denman, MD   Patient's Medications  New Prescriptions   No medications on file  Previous Medications   ALBUTEROL (PROVENTIL HFA;VENTOLIN HFA) 108 (90 BASE) MCG/ACT INHALER    Inhale 2 puffs into the lungs every 6 (six) hours as needed for wheezing or shortness of breath.   ATENOLOL (TENORMIN) 50 MG TABLET    Take 75 mg by mouth daily.   FLUTICASONE (FLONASE) 50 MCG/ACT NASAL SPRAY    Place 2 sprays into both nostrils daily.   FLUTICASONE-SALMETEROL (ADVAIR HFA) 115-21 MCG/ACT INHALER    Inhale 1 puff into the lungs 2 (two) times daily.   HYDROCHLOROTHIAZIDE (HYDRODIURIL) 25 MG TABLET    Take 0.5-1 tablets (12.5-25 mg total) by mouth daily.   LOSARTAN (COZAAR) 50 MG TABLET    Take 1 tablet (50 mg total) by mouth 2 (two) times daily.   LUTEIN-ZEAXANTHIN-SELENIUM 15-4.75-100 MG-MG-MCG CAPS  Take 1 capsule by mouth daily.    MULTIPLE VITAMINS-MINERALS (HAIR/SKIN/NAILS PO)    Take 1 capsule by mouth every other day.   OMEGA 3 1000 MG CAPS    Take 1 capsule by mouth.  Modified Medications   No medications on file  Discontinued Medications   ATENOLOL (TENORMIN) 50 MG TABLET    Take 1 tablet (50 mg total) by mouth daily. 1 1/2 tablets Daily

## 2015-09-19 ENCOUNTER — Ambulatory Visit (INDEPENDENT_AMBULATORY_CARE_PROVIDER_SITE_OTHER): Payer: Medicare HMO | Admitting: Psychology

## 2015-09-19 DIAGNOSIS — F4322 Adjustment disorder with anxiety: Secondary | ICD-10-CM

## 2015-09-21 ENCOUNTER — Other Ambulatory Visit: Payer: Self-pay | Admitting: Internal Medicine

## 2015-09-29 ENCOUNTER — Encounter: Payer: Self-pay | Admitting: Cardiovascular Disease

## 2015-09-29 ENCOUNTER — Ambulatory Visit (INDEPENDENT_AMBULATORY_CARE_PROVIDER_SITE_OTHER): Payer: Commercial Managed Care - HMO | Admitting: Cardiovascular Disease

## 2015-09-29 VITALS — BP 130/70 | HR 65 | Ht 61.0 in | Wt 190.5 lb

## 2015-09-29 DIAGNOSIS — R55 Syncope and collapse: Secondary | ICD-10-CM | POA: Diagnosis not present

## 2015-09-29 DIAGNOSIS — I10 Essential (primary) hypertension: Secondary | ICD-10-CM

## 2015-09-29 DIAGNOSIS — E785 Hyperlipidemia, unspecified: Secondary | ICD-10-CM

## 2015-09-29 DIAGNOSIS — R000 Tachycardia, unspecified: Secondary | ICD-10-CM | POA: Diagnosis not present

## 2015-09-29 NOTE — Assessment & Plan Note (Signed)
No further episodes of syncope. One episode of lightheadedness while doing chair yoga Recommended she stay hydrated and call our office if she has additional episodes

## 2015-09-29 NOTE — Assessment & Plan Note (Signed)
No further episodes of tachycardia. No  medication changes

## 2015-09-29 NOTE — Patient Instructions (Signed)
You are doing well. No medication changes were made.  Please call us if you have new issues that need to be addressed before your next appt.  Your physician wants you to follow-up in: 12 months.  You will receive a reminder letter in the mail two months in advance. If you don't receive a letter, please call our office to schedule the follow-up appointment. 

## 2015-09-29 NOTE — Assessment & Plan Note (Signed)
Cholesterol slightly high. Recommended exercise and weight loss

## 2015-09-29 NOTE — Assessment & Plan Note (Signed)
Long discussion with her concerning her blood pressure. Typically it is well controlled. Some climb with alcohol and caffeine. Does not stay elevated

## 2015-09-29 NOTE — Progress Notes (Signed)
Patient ID: Molly Faulkner, female    DOB: 1941-06-23, 74 y.o.   MRN: [id number]  HPI Comments: Molly Faulkner is a very pleasant 74 year old woman with history of hypertension, presenting with symptoms of syncope, tachycardia.  In follow-up today, she reports that she has had no further episodes of near syncope or syncope Possibly one episode of low blood pressure. She was doing chair yoga, pulling her legs up to her chest. She had acute onset of lightheadedness. She sat for several minutes and symptoms resolved without intervention. She wonders if she could've been dehydrated.  She does report when she drinks coffee or alcohol that her blood pressure will climb sometimes up to 240 systolic, otherwise is well controlled She takes losartan 50 g twice a day, atenolol in the morning, HCTZ 12.5 mill grams daily She denies any significant tachycardia  EKG on today's visit shows normal sinus rhythm with rate 68 beats present, no significant ST or T-wave changes  Other past medical history   February 2016 she was on a plane going to [place]. She had not drink much fluids, had a breakfast bar and a bottle of water. She had a queasy feeling in her stomach while on the plane, she got up the window seat, moved into the asle, when she developed lightheadedness, dizziness and acute syncope. She feels that she was only out for several seconds before she came to on the ground. She sat there for several minutes until she felt better, sat and had some juice for several minutes, felt better and then went to the bathroom in the back of the plane. Rest of the trip was uneventful.  10 days later approximately she woke up with tachycardia, February 09, 2015, measured her heart rate at 110 bpm, blood pressure also elevated. She felt general malaise, felt weird She rested and eventually after over one hour, heart rate came down, blood pressure came down. She went to see primary care and on arrival to their office, she  reports that her palpitations resolved. EKG at that time showed heart rate in the 60s.   Allergies  Allergen Reactions  . Avelox [Moxifloxacin Hcl In Nacl] Other (See Comments)    Chills  . Clarithromycin   . Trazodone And Nefazodone Swelling    Outpatient Encounter Prescriptions as of 09/29/2015  Medication Sig  . albuterol (PROVENTIL HFA;VENTOLIN HFA) 108 (90 BASE) MCG/ACT inhaler Inhale 2 puffs into the lungs every 6 (six) hours as needed for wheezing or shortness of breath.  Marland Kitchen atenolol (TENORMIN) 50 MG tablet Take 75 mg by mouth daily.  . fluticasone (FLONASE) 50 MCG/ACT nasal spray Place 2 sprays into both nostrils daily.  . fluticasone-salmeterol (ADVAIR HFA) 115-21 MCG/ACT inhaler Inhale 1 puff into the lungs 2 (two) times daily.  . hydrochlorothiazide (MICROZIDE) 12.5 MG capsule Take 12.5 mg by mouth daily.  Marland Kitchen losartan (COZAAR) 50 MG tablet Take 1 tablet (50 mg total) by mouth 2 (two) times daily.  . Lutein-Zeaxanthin-Selenium 15-4.75-100 MG-MG-MCG CAPS Take 1 capsule by mouth daily.   . Multiple Vitamins-Minerals (HAIR/SKIN/NAILS PO) Take 1 capsule by mouth every other day.  . Omega 3 1000 MG CAPS Take 1 capsule by mouth.  . [DISCONTINUED] hydrochlorothiazide (HYDRODIURIL) 25 MG tablet TAKE 1 TABLET EVERY DAY (Patient not taking: Reported on 09/29/2015)   No facility-administered encounter medications on file as of 09/29/2015.    Past Medical History  Diagnosis Date  . Asthma   . Arthritis   . Chicken pox   .  Depression   . Diverticulitis   . GERD (gastroesophageal reflux disease)   . Allergy   . Hypertension   . Hyperlipidemia   . Hx of colonic polyps   . 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 Insomnia     Past Surgical History  Procedure Laterality Date  . Breast biopsy Bilateral [id number]  . Abdominal hysterectomy  1994    total  . Carpal tunnel release Bilateral     Social History  reports that she has never smoked. She has never used smokeless tobacco. She reports  that she drinks alcohol. She reports that she does not use illicit drugs.  Family History family history includes Arthritis in her maternal grandmother; Breast cancer in her mother; Diabetes in her maternal grandmother.   Review of Systems  Constitutional: Negative.   Respiratory: Negative.   Cardiovascular: Negative.   Gastrointestinal: Negative.   Musculoskeletal: Negative.   Neurological: Negative.   Hematological: Negative.   Psychiatric/Behavioral: Negative.   All other systems reviewed and are negative.   BP 130/70 mmHg  Pulse 65  Ht 5\' 1"  (1.549 m)  Wt 190 lb 8 oz (86.41 kg)  BMI 36.01 kg/m2   Orthostatics as above Physical Exam  Constitutional: She is oriented to person, place, and time. She appears well-developed and well-nourished.  HENT:  Head: Normocephalic.  Nose: Nose normal.  Mouth/Throat: Oropharynx is clear and moist.  Eyes: Conjunctivae are normal. Pupils are equal, round, and reactive to light.  Neck: Normal range of motion. Neck supple. No JVD present.  Cardiovascular: Normal rate, regular rhythm, S1 normal, S2 normal, normal heart sounds and intact distal pulses.  Exam reveals no gallop and no friction rub.   No murmur heard. Pulmonary/Chest: Effort normal and breath sounds normal. No respiratory distress. She has no wheezes. She has no rales. She exhibits no tenderness.  Abdominal: Soft. Bowel sounds are normal. She exhibits no distension. There is no tenderness.  Musculoskeletal: Normal range of motion. She exhibits no edema or tenderness.  Lymphadenopathy:    She has no cervical adenopathy.  Neurological: She is alert and oriented to person, place, and time. Coordination normal.  Skin: Skin is warm and dry. No rash noted. No erythema.  Psychiatric: She has a normal mood and affect. Her behavior is normal. Judgment and thought content normal.    Assessment and Plan  Nursing note and vitals reviewed.

## 2015-10-04 DIAGNOSIS — M9901 Segmental and somatic dysfunction of cervical region: Secondary | ICD-10-CM | POA: Diagnosis not present

## 2015-10-04 DIAGNOSIS — M62838 Other muscle spasm: Secondary | ICD-10-CM | POA: Diagnosis not present

## 2015-10-04 DIAGNOSIS — M9903 Segmental and somatic dysfunction of lumbar region: Secondary | ICD-10-CM | POA: Diagnosis not present

## 2015-10-04 DIAGNOSIS — M546 Pain in thoracic spine: Secondary | ICD-10-CM | POA: Diagnosis not present

## 2015-10-04 DIAGNOSIS — M9902 Segmental and somatic dysfunction of thoracic region: Secondary | ICD-10-CM | POA: Diagnosis not present

## 2015-10-04 DIAGNOSIS — M25559 Pain in unspecified hip: Secondary | ICD-10-CM | POA: Diagnosis not present

## 2015-10-04 DIAGNOSIS — M542 Cervicalgia: Secondary | ICD-10-CM | POA: Diagnosis not present

## 2015-10-17 ENCOUNTER — Ambulatory Visit (INDEPENDENT_AMBULATORY_CARE_PROVIDER_SITE_OTHER): Payer: Medicare HMO | Admitting: Psychology

## 2015-10-17 DIAGNOSIS — F4323 Adjustment disorder with mixed anxiety and depressed mood: Secondary | ICD-10-CM

## 2015-11-14 ENCOUNTER — Ambulatory Visit: Payer: Commercial Managed Care - HMO | Admitting: Psychology

## 2015-11-21 ENCOUNTER — Ambulatory Visit (INDEPENDENT_AMBULATORY_CARE_PROVIDER_SITE_OTHER): Payer: Medicare HMO | Admitting: Psychology

## 2015-11-21 DIAGNOSIS — F4322 Adjustment disorder with anxiety: Secondary | ICD-10-CM

## 2015-12-12 ENCOUNTER — Other Ambulatory Visit: Payer: Self-pay | Admitting: Internal Medicine

## 2015-12-26 ENCOUNTER — Ambulatory Visit: Payer: Commercial Managed Care - HMO | Admitting: Psychology

## 2016-01-30 ENCOUNTER — Ambulatory Visit (INDEPENDENT_AMBULATORY_CARE_PROVIDER_SITE_OTHER): Payer: Medicare HMO | Admitting: Psychology

## 2016-01-30 DIAGNOSIS — F4322 Adjustment disorder with anxiety: Secondary | ICD-10-CM

## 2016-02-05 ENCOUNTER — Other Ambulatory Visit: Payer: Self-pay | Admitting: Internal Medicine

## 2016-02-05 NOTE — Telephone Encounter (Signed)
Medication sent electronically 

## 2016-02-05 NOTE — Telephone Encounter (Signed)
Rx never filled by you--please advise if okay to refill

## 2016-02-13 ENCOUNTER — Encounter: Payer: Self-pay | Admitting: Internal Medicine

## 2016-02-13 ENCOUNTER — Ambulatory Visit (INDEPENDENT_AMBULATORY_CARE_PROVIDER_SITE_OTHER): Payer: Commercial Managed Care - HMO | Admitting: Internal Medicine

## 2016-02-13 VITALS — BP 130/82 | HR 58 | Temp 99.0°F | Ht 61.0 in | Wt 190.0 lb

## 2016-02-13 DIAGNOSIS — E559 Vitamin D deficiency, unspecified: Secondary | ICD-10-CM

## 2016-02-13 DIAGNOSIS — K219 Gastro-esophageal reflux disease without esophagitis: Secondary | ICD-10-CM

## 2016-02-13 DIAGNOSIS — R7309 Other abnormal glucose: Secondary | ICD-10-CM

## 2016-02-13 DIAGNOSIS — I10 Essential (primary) hypertension: Secondary | ICD-10-CM

## 2016-02-13 DIAGNOSIS — E785 Hyperlipidemia, unspecified: Secondary | ICD-10-CM

## 2016-02-13 DIAGNOSIS — Z0000 Encounter for general adult medical examination without abnormal findings: Secondary | ICD-10-CM | POA: Diagnosis not present

## 2016-02-13 DIAGNOSIS — Z136 Encounter for screening for cardiovascular disorders: Secondary | ICD-10-CM

## 2016-02-13 DIAGNOSIS — Z1321 Encounter for screening for nutritional disorder: Secondary | ICD-10-CM

## 2016-02-13 DIAGNOSIS — J4540 Moderate persistent asthma, uncomplicated: Secondary | ICD-10-CM | POA: Diagnosis not present

## 2016-02-13 DIAGNOSIS — Z23 Encounter for immunization: Secondary | ICD-10-CM | POA: Diagnosis not present

## 2016-02-13 DIAGNOSIS — F32A Depression, unspecified: Secondary | ICD-10-CM

## 2016-02-13 DIAGNOSIS — F329 Major depressive disorder, single episode, unspecified: Secondary | ICD-10-CM | POA: Diagnosis not present

## 2016-02-13 DIAGNOSIS — G4700 Insomnia, unspecified: Secondary | ICD-10-CM

## 2016-02-13 LAB — CBC
HCT: 38.8 % (ref 36.0–46.0)
HEMOGLOBIN: 13.5 g/dL (ref 12.0–15.0)
MCHC: 34.7 g/dL (ref 30.0–36.0)
MCV: 94 fl (ref 78.0–100.0)
Platelets: 216 10*3/uL (ref 150.0–400.0)
RBC: 4.13 Mil/uL (ref 3.87–5.11)
RDW: 13.1 % (ref 11.5–15.5)
WBC: 6.9 10*3/uL (ref 4.0–10.5)

## 2016-02-13 LAB — LIPID PANEL
Cholesterol: 182 mg/dL (ref 0–200)
HDL: 46.6 mg/dL (ref >39.00)
LDL Cholesterol: 117 mg/dL — ABNORMAL HIGH (ref 0–99)
NONHDL: 134.92
Total CHOL/HDL Ratio: 4
Triglycerides: 90 mg/dL (ref 0.0–149.0)
VLDL: 18 mg/dL (ref 0.0–40.0)

## 2016-02-13 LAB — COMPREHENSIVE METABOLIC PANEL
ALK PHOS: 60 U/L (ref 39–117)
ALT: 18 U/L (ref 0–35)
AST: 20 U/L (ref 0–37)
Albumin: 4.1 g/dL (ref 3.5–5.2)
BILIRUBIN TOTAL: 0.9 mg/dL (ref 0.2–1.2)
BUN: 15 mg/dL (ref 6–23)
CO2: 29 mEq/L (ref 19–32)
CREATININE: 0.88 mg/dL (ref 0.40–1.20)
Calcium: 9.5 mg/dL (ref 8.4–10.5)
Chloride: 106 mEq/L (ref 96–112)
GFR: 81.07 mL/min (ref >60.00)
GLUCOSE: 102 mg/dL — AB (ref 70–99)
Potassium: 4.1 mEq/L (ref 3.5–5.1)
Sodium: 138 mEq/L (ref 135–145)
TOTAL PROTEIN: 7 g/dL (ref 6.0–8.3)

## 2016-02-13 LAB — VITAMIN D 25 HYDROXY (VIT D DEFICIENCY, FRACTURES): VITD: 13.64 ng/mL — AB (ref 30.00–100.00)

## 2016-02-13 LAB — HEMOGLOBIN A1C: HEMOGLOBIN A1C: 5.1 % (ref 4.6–6.5)

## 2016-02-13 NOTE — Assessment & Plan Note (Signed)
Chronic but stable She is not interested in medication treatment at this time She will continue to go to therapy and support groups weekly

## 2016-02-13 NOTE — Assessment & Plan Note (Signed)
Currently not an issue Will continue to monitor Discussed how weight loss could help improve her reflux

## 2016-02-13 NOTE — Assessment & Plan Note (Signed)
Will check Lipid profile today

## 2016-02-13 NOTE — Progress Notes (Signed)
Pre visit review using our clinic review tool, if applicable. No additional management support is needed unless otherwise documented below in the visit note. 

## 2016-02-13 NOTE — Assessment & Plan Note (Signed)
Chronic She is not interested in any sleep aids at this time Advised her to try Melatonin OTC

## 2016-02-13 NOTE — Patient Instructions (Signed)
Trigger Finger °Trigger finger (digital tendinitis and stenosing tenosynovitis) is a common disorder that causes an often painful catching of the fingers or thumb. It occurs as a clicking, snapping, or locking of a finger in the palm of the hand. This is caused by a problem with the tendons that flex or bend the fingers sliding smoothly through their sheaths. The condition may occur in any finger or a couple fingers at the same time.  °The finger may lock with the finger curled or suddenly straighten out with a snap. This is more common in patients with rheumatoid arthritis and diabetes. Left untreated, the condition may get worse to the point where the finger becomes locked in flexion, like making a fist, or less commonly locked with the finger straightened out. °CAUSES  °· Inflammation and scarring that lead to swelling around the tendon sheath. °· Repeated or forceful movements. °· Rheumatoid arthritis, an autoimmune disease that affects joints. °· Gout. °· Diabetes mellitus. °SIGNS AND SYMPTOMS °· Soreness and swelling of your finger. °· A painful clicking or snapping as you bend and straighten your finger. °DIAGNOSIS  °Your health care provider will do a physical exam of your finger to diagnose trigger finger. °TREATMENT  °· Splinting for 6-8 weeks may be helpful. °· Nonsteroidal anti-inflammatory medicines (NSAIDs) can help to relieve the pain and inflammation. °· Cortisone injections, along with splinting, may speed up recovery. Several injections may be required. Cortisone may give relief after one injection. °· Surgery is another treatment that may be used if conservative treatments do not work. Surgery can be minor, without incisions (a cut does not have to be made), and can be done with a needle through the skin. °· Other surgical choices involve an open procedure in which the surgeon opens the hand through a small incision and cuts the pulley so the tendon can again slide smoothly. Your hand will still  work fine. °HOME CARE INSTRUCTIONS °· Apply ice to the injured area, twice per day: °¨ Put ice in a plastic bag. °¨ Place a towel between your skin and the bag. °¨ Leave the ice on for 20 minutes, 3-4 times a day. °· Rest your hand often. °MAKE SURE YOU:  °· Understand these instructions. °· Will watch your condition. °· Will get help right away if you are not doing well or get worse. °  °This information is not intended to replace advice given to you by your health care provider. Make sure you discuss any questions you have with your health care provider. °  °Document Released: 09/21/2004 Document Revised: 08/04/2013 Document Reviewed: 05/04/2013 °Elsevier Interactive Patient Education ©2016 Else[employer] Inc[employer] ° °

## 2016-02-13 NOTE — Progress Notes (Signed)
HPI:  Pt presents to the clinic today for her Medicare Wellness Visit. She is also due for follow up of chronic conditions.  Insomnia: She is able to fall asleep but is not able to stay asleep. She was on Ambien in the past but did not like the way it made her feel. She usually gets 5-7 hours of broken sleep. Her sister, who has Alzheimer's has started waking her up in the middle of the night.  GERD: Not currently an issue. She does not take anything for this.  Depression: Not medicated. She is under a lot of stress currently as she is having to care for her sister. She is attending an Alzheimer's support group. She also see's Dr. Rexene Edison once a week.  HTN: Well controlled on Losartan,  HCTZ and Atenolol. Her BP today is 130/82. ECG in EMR reviewed.  Asthma: Moderate persistent. No recent flares. On Advair. Albuterol as needed. She does not follow up with a pulmonologist.  Past Medical History  Diagnosis Date  . Asthma   . Arthritis   . Chicken pox   . Depression   . Diverticulitis   . GERD (gastroesophageal reflux disease)   . Allergy   . Hypertension   . Hyperlipidemia   . Hx of colonic polyps   . 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 Insomnia     Current Outpatient Prescriptions  Medication Sig Dispense Refill  . ADVAIR HFA 115-21 MCG/ACT inhaler INHALE 1 PUFF BY MOUTH TWICE DAILY 12 g 11  . albuterol (PROVENTIL HFA;VENTOLIN HFA) 108 (90 BASE) MCG/ACT inhaler Inhale 2 puffs into the lungs every 6 (six) hours as needed for wheezing or shortness of breath. 1 Inhaler 0  . atenolol (TENORMIN) 50 MG tablet TAKE 1 AND 1/2 TABLETS EVERY DAY 135 tablet 1  . fluticasone (FLONASE) 50 MCG/ACT nasal spray Place 2 sprays into both nostrils daily. 48 g 0  . hydrochlorothiazide (MICROZIDE) 12.5 MG capsule Take 12.5 mg by mouth daily.    Marland Kitchen losartan (COZAAR) 50 MG tablet Take 1 tablet (50 mg total) by mouth 2 (two) times daily. 180 tablet 3  . Lutein-Zeaxanthin-Selenium 15-4.75-100 MG-MG-MCG CAPS Take 1  capsule by mouth daily.     . Multiple Vitamins-Minerals (HAIR/SKIN/NAILS PO) Take 1 capsule by mouth every other day.    . Omega 3 1000 MG CAPS Take 1 capsule by mouth.     No current facility-administered medications for this visit.    Allergies  Allergen Reactions  . Avelox [Moxifloxacin Hcl In Nacl] Other (See Comments)    Chills  . Clarithromycin   . Trazodone And Nefazodone Swelling    Family History  Problem Relation Age of Onset  . Breast cancer Mother   . Arthritis Maternal Grandmother   . Diabetes Maternal Grandmother     Social History   Social History  . Marital Status: Single    Spouse Name: N/A  . Number of Children: N/A  . Years of Education: N/A   Occupational History  . Not on file.   Social History Main Topics  . Smoking status: Never Smoker   . Smokeless tobacco: Never Used     Comment: quit over 50 years ago  . Alcohol Use: Yes     Comment: rare--liquor  . Drug Use: No  . Sexual Activity: Not Currently   Other Topics Concern  . Not on file   Social History Narrative    Hospitiliaztions: None  Health Maintenance:    Flu: 08/2015  Tetanus:  12/2011  Pneumovax: 01/2012  Prevnar: never  Zostavax: 2013  Mammogram: 10/2013, make an appt at [facility] breast center  Pap Smear: Hysterectomy  Bone Density: 2012- normal  Colon Screening: 02/2013  Eye Doctor: yearly  Dental Exam: biannually   Providers:   PCP: Webb Silversmith, NP-C  Cardiologist: Dr. Rockey Situ     I have personally reviewed and have noted:  1. The patient's medical and social history 2. Their use of alcohol, tobacco or illicit drugs 3. Their current medications and supplements 4. The patient's functional ability including ADL's, fall risks, home  safety risks and hearing or visual impairment. 5. Diet and physical activities 6. Evidence for depression or mood disorder  Subjective:   Review of Systems:   Constitutional: Denies fever, malaise, fatigue, headache or abrupt  weight changes.  HEENT: Denies eye pain, eye redness, ear pain, ringing in the ears, wax buildup, runny nose, nasal congestion, bloody nose, or sore throat. Respiratory: Denies difficulty breathing, shortness of breath, cough or sputum production.   Cardiovascular: Denies chest pain, chest tightness, palpitations or swelling in the hands or feet.  Gastrointestinal: Denies abdominal pain, bloating, constipation, diarrhea or blood in the stool.  GU: Denies urgency, frequency, pain with urination, burning sensation, blood in urine, odor or discharge. Musculoskeletal: Pt reports trigger fingers, left hand. Denies decrease in range of motion, difficulty with gait, muscle pain or joint pain and swelling.  Skin: Denies redness, rashes, lesions or ulcercations.  Neurological: Denies dizziness, difficulty with memory, difficulty with speech or problems with balance and coordination.  Psych: Pt reports mild depression. Denies anxiety, SI/HI.  No other specific complaints in a complete review of systems (except as listed in HPI above).  Objective:  PE:   BP 130/82 mmHg  Pulse 58  Temp(Src) 99 F (37.2 C) (Oral)  Ht 5\' 1"  (1.549 m)  Wt 190 lb (86.183 kg)  BMI 35.92 kg/m2  SpO2 98%  Wt Readings from Last 3 Encounters:  09/29/15 190 lb 8 oz (86.41 kg)  09/18/15 190 lb 8 oz (86.41 kg)  09/06/15 189 lb (85.73 kg)    General: Appears her stated age, obese in NAD. Cardiovascular: Normal rate and rhythm. S1,S2 noted.  No murmur, rubs or gallops noted. No JVD or BLE edema. No carotid bruits noted. Pulmonary/Chest: Normal effort and positive vesicular breath sounds. No respiratory distress. No wheezes, rales or ronchi noted.  Abdomen: Soft and nontender. . Musculoskeletal: Normal flexion of fingers on left hand, extension is slow and not able to hyperextend. Hand grips equal. Neurological: Alert and oriented.  Psychiatric: Mood and affect normal. Behavior is normal. Judgment and thought content  normal.     BMET    Component Value Date/Time   NA 140 02/21/2015 1236   K 4.3 02/21/2015 1236   CL 101 02/21/2015 1236   CO2 25 02/21/2015 1236   GLUCOSE 105* 02/21/2015 1236   BUN 14 02/21/2015 1236   CREATININE 0.95 02/21/2015 1236   CALCIUM 9.2 02/21/2015 1236   GFRNONAA 60 02/21/2015 1236   GFRAA 70 02/21/2015 1236    Lipid Panel  No results found for: CHOL, TRIG, HDL, CHOLHDL, VLDL, LDLCALC  CBC No results found for: WBC, RBC, HGB, HCT, PLT, MCV, MCH, MCHC, RDW, LYMPHSABS, MONOABS, EOSABS, BASOSABS  Hgb A1C No results found for: HGBA1C    Assessment and Plan:   Medicare Annual Wellness Visit:  Diet: She does eat meat. She eats fruits and veggies daily. She tries to avoid fried foods. She drinks mostly  hot tea and water. Physical activity: Sedentary Depression/mood screen: Negative Hearing: Intact to whispered voice Visual acuity: Grossly normal, performs annual eye exam  ADLs: Capable Fall risk: None Home safety: Good Cognitive evaluation: Intact to orientation, naming, recall and repetition EOL planning: Adv directives, full code/ I agree  Preventative Medicine: Prevnar today. She is going to call and make an appt for her mammogram. She declines bone density screening. She no longer screens for cervical cancer. All other HM UTD.   Next appointment: 6 months

## 2016-02-13 NOTE — Assessment & Plan Note (Signed)
Stable on Advair Continue Albuterol prn

## 2016-02-13 NOTE — Assessment & Plan Note (Signed)
Controlled Continue Losartan, HCTZ and Atenolol

## 2016-02-14 DIAGNOSIS — Z0000 Encounter for general adult medical examination without abnormal findings: Secondary | ICD-10-CM | POA: Diagnosis not present

## 2016-02-14 DIAGNOSIS — Z23 Encounter for immunization: Secondary | ICD-10-CM

## 2016-02-14 DIAGNOSIS — E559 Vitamin D deficiency, unspecified: Secondary | ICD-10-CM | POA: Diagnosis not present

## 2016-02-14 DIAGNOSIS — Z1321 Encounter for screening for nutritional disorder: Secondary | ICD-10-CM | POA: Diagnosis not present

## 2016-02-14 MED ORDER — VITAMIN D (ERGOCALCIFEROL) 1.25 MG (50000 UNIT) PO CAPS: 50000.0000 [IU] | ORAL_CAPSULE | ORAL | Status: DC

## 2016-02-14 NOTE — Addendum Note (Signed)
Addended by: Lurlean Nanny on: 02/14/2016 04:43 PM   Modules accepted: Orders

## 2016-02-14 NOTE — Addendum Note (Signed)
Addended by: Lurlean Nanny on: 02/14/2016 11:21 AM   Modules accepted: Orders

## 2016-02-23 DIAGNOSIS — M62838 Other muscle spasm: Secondary | ICD-10-CM | POA: Diagnosis not present

## 2016-02-23 DIAGNOSIS — M9903 Segmental and somatic dysfunction of lumbar region: Secondary | ICD-10-CM | POA: Diagnosis not present

## 2016-02-23 DIAGNOSIS — M9901 Segmental and somatic dysfunction of cervical region: Secondary | ICD-10-CM | POA: Diagnosis not present

## 2016-02-23 DIAGNOSIS — M542 Cervicalgia: Secondary | ICD-10-CM | POA: Diagnosis not present

## 2016-02-23 DIAGNOSIS — M546 Pain in thoracic spine: Secondary | ICD-10-CM | POA: Diagnosis not present

## 2016-02-23 DIAGNOSIS — M9902 Segmental and somatic dysfunction of thoracic region: Secondary | ICD-10-CM | POA: Diagnosis not present

## 2016-02-23 DIAGNOSIS — M25559 Pain in unspecified hip: Secondary | ICD-10-CM | POA: Diagnosis not present

## 2016-03-05 ENCOUNTER — Ambulatory Visit: Payer: Commercial Managed Care - HMO | Admitting: Psychology

## 2016-03-06 ENCOUNTER — Ambulatory Visit (INDEPENDENT_AMBULATORY_CARE_PROVIDER_SITE_OTHER): Payer: Commercial Managed Care - HMO | Admitting: Psychology

## 2016-03-06 DIAGNOSIS — F4322 Adjustment disorder with anxiety: Secondary | ICD-10-CM | POA: Diagnosis not present

## 2016-04-03 ENCOUNTER — Ambulatory Visit (INDEPENDENT_AMBULATORY_CARE_PROVIDER_SITE_OTHER): Payer: Commercial Managed Care - HMO | Admitting: Psychology

## 2016-04-03 DIAGNOSIS — F4323 Adjustment disorder with mixed anxiety and depressed mood: Secondary | ICD-10-CM | POA: Diagnosis not present

## 2016-04-11 ENCOUNTER — Other Ambulatory Visit: Payer: Self-pay | Admitting: Internal Medicine

## 2016-04-11 DIAGNOSIS — Z1231 Encounter for screening mammogram for malignant neoplasm of breast: Secondary | ICD-10-CM

## 2016-04-19 ENCOUNTER — Ambulatory Visit
Admission: RE | Admit: 2016-04-19 | Discharge: 2016-04-19 | Disposition: A | Discharge status: Home / Self Care | Payer: Commercial Managed Care - HMO | Source: Ambulatory Visit | Attending: Internal Medicine | Admitting: Internal Medicine

## 2016-04-19 ENCOUNTER — Other Ambulatory Visit: Payer: Self-pay | Admitting: Internal Medicine

## 2016-04-19 ENCOUNTER — Ambulatory Visit: Payer: Commercial Managed Care - HMO

## 2016-04-19 DIAGNOSIS — Z1231 Encounter for screening mammogram for malignant neoplasm of breast: Secondary | ICD-10-CM | POA: Insufficient documentation

## 2016-04-19 DIAGNOSIS — R928 Other abnormal and inconclusive findings on diagnostic imaging of breast: Secondary | ICD-10-CM | POA: Insufficient documentation

## 2016-05-07 ENCOUNTER — Other Ambulatory Visit: Payer: Self-pay | Admitting: Internal Medicine

## 2016-05-07 DIAGNOSIS — R928 Other abnormal and inconclusive findings on diagnostic imaging of breast: Secondary | ICD-10-CM

## 2016-05-10 ENCOUNTER — Ambulatory Visit (INDEPENDENT_AMBULATORY_CARE_PROVIDER_SITE_OTHER): Payer: Commercial Managed Care - HMO | Admitting: Psychology

## 2016-05-10 DIAGNOSIS — M9903 Segmental and somatic dysfunction of lumbar region: Secondary | ICD-10-CM | POA: Diagnosis not present

## 2016-05-10 DIAGNOSIS — F4323 Adjustment disorder with mixed anxiety and depressed mood: Secondary | ICD-10-CM | POA: Diagnosis not present

## 2016-05-10 DIAGNOSIS — M546 Pain in thoracic spine: Secondary | ICD-10-CM | POA: Diagnosis not present

## 2016-05-10 DIAGNOSIS — M62838 Other muscle spasm: Secondary | ICD-10-CM | POA: Diagnosis not present

## 2016-05-10 DIAGNOSIS — M542 Cervicalgia: Secondary | ICD-10-CM | POA: Diagnosis not present

## 2016-05-10 DIAGNOSIS — M9901 Segmental and somatic dysfunction of cervical region: Secondary | ICD-10-CM | POA: Diagnosis not present

## 2016-05-10 DIAGNOSIS — M25559 Pain in unspecified hip: Secondary | ICD-10-CM | POA: Diagnosis not present

## 2016-05-10 DIAGNOSIS — M9902 Segmental and somatic dysfunction of thoracic region: Secondary | ICD-10-CM | POA: Diagnosis not present

## 2016-05-16 ENCOUNTER — Other Ambulatory Visit (INDEPENDENT_AMBULATORY_CARE_PROVIDER_SITE_OTHER): Payer: Commercial Managed Care - HMO

## 2016-05-16 DIAGNOSIS — E559 Vitamin D deficiency, unspecified: Secondary | ICD-10-CM

## 2016-05-16 LAB — VITAMIN D 25 HYDROXY (VIT D DEFICIENCY, FRACTURES): VITD: 56.33 ng/mL (ref 30.00–100.00)

## 2016-05-20 ENCOUNTER — Ambulatory Visit
Admission: RE | Admit: 2016-05-20 | Discharge: 2016-05-20 | Disposition: A | Discharge status: Home / Self Care | Payer: Commercial Managed Care - HMO | Source: Ambulatory Visit | Attending: Internal Medicine | Admitting: Internal Medicine

## 2016-05-20 DIAGNOSIS — R928 Other abnormal and inconclusive findings on diagnostic imaging of breast: Secondary | ICD-10-CM | POA: Diagnosis not present

## 2016-06-07 ENCOUNTER — Ambulatory Visit: Payer: Commercial Managed Care - HMO | Admitting: Psychology

## 2016-06-07 ENCOUNTER — Other Ambulatory Visit: Payer: Self-pay | Admitting: Internal Medicine

## 2016-06-08 ENCOUNTER — Other Ambulatory Visit: Payer: Self-pay | Admitting: Cardiovascular Disease

## 2016-06-24 ENCOUNTER — Ambulatory Visit (INDEPENDENT_AMBULATORY_CARE_PROVIDER_SITE_OTHER): Payer: Commercial Managed Care - HMO | Admitting: Internal Medicine

## 2016-06-24 ENCOUNTER — Encounter: Payer: Self-pay | Admitting: Internal Medicine

## 2016-06-24 VITALS — BP 126/80 | HR 58 | Temp 99.0°F | Wt 189.0 lb

## 2016-06-24 DIAGNOSIS — I10 Essential (primary) hypertension: Secondary | ICD-10-CM

## 2016-06-24 DIAGNOSIS — G4719 Other hypersomnia: Secondary | ICD-10-CM

## 2016-06-24 DIAGNOSIS — J4540 Moderate persistent asthma, uncomplicated: Secondary | ICD-10-CM

## 2016-06-24 DIAGNOSIS — E669 Obesity, unspecified: Secondary | ICD-10-CM | POA: Diagnosis not present

## 2016-06-24 DIAGNOSIS — G4700 Insomnia, unspecified: Secondary | ICD-10-CM

## 2016-06-24 DIAGNOSIS — R0683 Snoring: Secondary | ICD-10-CM

## 2016-06-24 NOTE — Patient Instructions (Signed)
Sleep Apnea  Sleep apnea is a sleep disorder characterized by abnormal pauses in breathing while you sleep. When your breathing pauses, the level of oxygen in your blood decreases. This causes you to move out of deep sleep and into light sleep. As a result, your quality of sleep is poor, and the system that carries your blood throughout your body (cardiovascular system) experiences stress. If sleep apnea remains untreated, the following conditions can develop:  High blood pressure (hypertension).  Coronary artery disease.  Inability to achieve or maintain an erection (impotence).  Impairment of your thought process (cognitive dysfunction). There are three types of sleep apnea: 1. Obstructive sleep apnea--Pauses in breathing during sleep because of a blocked airway. 2. Central sleep apnea--Pauses in breathing during sleep because the area of the brain that controls your breathing does not send the correct signals to the muscles that control breathing. 3. Mixed sleep apnea--A combination of both obstructive and central sleep apnea. RISK FACTORS The following risk factors can increase your risk of developing sleep apnea:  Being overweight.  Smoking.  Having narrow passages in your nose and throat.  Being of older age.  Being female.  Alcohol use.  Sedative and tranquilizer use.  Ethnicity. Among individuals younger than 35 years, African Americans are at increased risk of sleep apnea. SYMPTOMS   Difficulty staying asleep.  Daytime sleepiness and fatigue.  Loss of energy.  Irritability.  Loud, heavy snoring.  Morning headaches.  Trouble concentrating.  Forgetfulness.  Decreased interest in sex.  Unexplained sleepiness. DIAGNOSIS  In order to diagnose sleep apnea, your caregiver will perform a physical examination. A sleep study done in the comfort of your own home may be appropriate if you are otherwise healthy. Your caregiver may also recommend that you spend the  night in a sleep lab. In the sleep lab, several monitors record information about your heart, lungs, and brain while you sleep. Your leg and arm movements and blood oxygen level are also recorded. TREATMENT The following actions may help to resolve mild sleep apnea:  Sleeping on your side.   Using a decongestant if you have nasal congestion.   Avoiding the use of depressants, including alcohol, sedatives, and narcotics.   Losing weight and modifying your diet if you are overweight. There also are devices and treatments to help open your airway:  Oral appliances. These are custom-made mouthpieces that shift your lower jaw forward and slightly open your bite. This opens your airway.  Devices that create positive airway pressure. This positive pressure "splints" your airway open to help you breathe better during sleep. The following devices create positive airway pressure:  Continuous positive airway pressure (CPAP) device. The CPAP device creates a continuous level of air pressure with an air pump. The air is delivered to your airway through a mask while you sleep. This continuous pressure keeps your airway open.  Nasal expiratory positive airway pressure (EPAP) device. The EPAP device creates positive air pressure as you exhale. The device consists of single-use valves, which are inserted into each nostril and held in place by adhesive. The valves create very little resistance when you inhale but create much more resistance when you exhale. That increased resistance creates the positive airway pressure. This positive pressure while you exhale keeps your airway open, making it easier to breath when you inhale again.  Bilevel positive airway pressure (BPAP) device. The BPAP device is used mainly in patients with central sleep apnea. This device is similar to the CPAP device because   it also uses an air pump to deliver continuous air pressure through a mask. However, with the BPAP machine, the  pressure is set at two different levels. The pressure when you exhale is lower than the pressure when you inhale.  Surgery. Typically, surgery is only done if you cannot comply with less invasive treatments or if the less invasive treatments do not improve your condition. Surgery involves removing excess tissue in your airway to create a wider passage way.   This information is not intended to replace advice given to you by your health care provider. Make sure you discuss any questions you have with your health care provider.   Document Released: 11/22/2002 Document Revised: 12/23/2014 Document Reviewed: 04/09/2012 Elsevier Interactive Patient Education 2016 [employer]   

## 2016-06-24 NOTE — Progress Notes (Signed)
Subjective:    Patient ID: Molly Faulkner, female    DOB: [birth year], 75 y.o.   MRN: [id number]  HPI  Pt presents to the clinic today with concerns about possible sleep apnea. She does snore at night. She wakes up every 2 hours. She feels tired during the day. She reports that she had a sleep study in 2009 and was diagnosed with OSA. She wore a CPAP machine for about a year, but stopped because life got busy. She did sleep better while she was using the CPAP. She does have a PMH of HTN, moderate, persistent asthma, insomnia, and obesity. She is stressed, caring for her sister with Alzheimer's. She is seeing Pervis Hocking once a month.  Review of Systems      Past Medical History  Diagnosis Date  . Asthma   . Arthritis   . Chicken pox   . Depression   . Diverticulitis   . GERD (gastroesophageal reflux disease)   . Allergy   . Hypertension   . Hyperlipidemia   . Hx of colonic polyps   . 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 Insomnia     Current Outpatient Prescriptions  Medication Sig Dispense Refill  . ADVAIR HFA 115-21 MCG/ACT inhaler INHALE 1 PUFF BY MOUTH TWICE DAILY 12 g 11  . albuterol (PROVENTIL HFA;VENTOLIN HFA) 108 (90 BASE) MCG/ACT inhaler Inhale 2 puffs into the lungs every 6 (six) hours as needed for wheezing or shortness of breath. 1 Inhaler 0  . atenolol (TENORMIN) 50 MG tablet Take 1 and 1/2 tablets daily. PLEASE SCHEDULE FOLLOW UP OFFICE VISIT FOR September 2017 135 tablet 0  . fluticasone (FLONASE) 50 MCG/ACT nasal spray Place 2 sprays into both nostrils daily. 48 g 0  . hydrochlorothiazide (MICROZIDE) 12.5 MG capsule Take 12.5 mg by mouth daily.    Marland Kitchen losartan (COZAAR) 50 MG tablet TAKE 1 TABLET (50 MG TOTAL) BY MOUTH 2 (TWO) TIMES DAILY. 180 tablet 3  . Lutein-Zeaxanthin-Selenium 15-4.75-100 MG-MG-MCG CAPS Take 1 capsule by mouth daily.     . Multiple Vitamins-Minerals (HAIR/SKIN/NAILS PO) Take 1 capsule by mouth every other day.    . Omega 3 1000 MG CAPS Take 1 capsule by  mouth.    . Vitamin D, Ergocalciferol, (DRISDOL) 50000 units CAPS capsule Take 1 capsule (50,000 Units total) by mouth every 7 (seven) days. 12 capsule 0   No current facility-administered medications for this visit.    Allergies  Allergen Reactions  . Avelox [Moxifloxacin Hcl In Nacl] Other (See Comments)    Chills  . Clarithromycin   . Trazodone And Nefazodone Swelling    Family History  Problem Relation Age of Onset  . Breast cancer Mother 110  . Arthritis Maternal Grandmother   . Diabetes Maternal Grandmother     Social History   Social History  . Marital Status: Single    Spouse Name: N/A  . Number of Children: N/A  . Years of Education: N/A   Occupational History  . Not on file.   Social History Main Topics  . Smoking status: Never Smoker   . Smokeless tobacco: Never Used     Comment: quit over 50 years ago  . Alcohol Use: Yes     Comment: rare--liquor  . Drug Use: No  . Sexual Activity: Not Currently   Other Topics Concern  . Not on file   Social History Narrative     Constitutional: Pt reports fatigue. Denies fever, malaise, headache or abrupt weight changes.  Respiratory: Denies difficulty breathing, shortness of breath, cough or sputum production.   Cardiovascular: Denies chest pain, chest tightness, palpitations or swelling in the hands or feet.  Neurological: Pt reports insomnia. Denies dizziness, difficulty with memory, difficulty with speech or problems with balance and coordination.  Psych: Pt reports caregiver stress. Denies anxiety, depression, SI/HI.  No other specific complaints in a complete review of systems (except as listed in HPI above).  Objective:   Physical Exam   BP 126/80 mmHg  Pulse 58  Temp(Src) 99 F (37.2 C) (Oral)  Wt 189 lb (85.73 kg)  SpO2 98% Wt Readings from Last 3 Encounters:  06/24/16 189 lb (85.73 kg)  02/13/16 190 lb (86.183 kg)  09/29/15 190 lb 8 oz (86.41 kg)    General: Appears her stated age, obese in  NAD. Neck:  Neck supple, trachea midline. No masses, lumps or thyromegaly present.  Cardiovascular: Normal rate and rhythm.  Pulmonary/Chest: Normal effort and positive vesicular breath sounds. No respiratory distress. No wheezes, rales or ronchi noted.  Neurological: Alert and oriented.  Psychiatric: Mood and affect normal.   BMET    Component Value Date/Time   NA 138 02/13/2016 1523   NA 140 02/21/2015 1236   K 4.1 02/13/2016 1523   CL 106 02/13/2016 1523   CO2 29 02/13/2016 1523   GLUCOSE 102* 02/13/2016 1523   GLUCOSE 105* 02/21/2015 1236   BUN 15 02/13/2016 1523   BUN 14 02/21/2015 1236   CREATININE 0.88 02/13/2016 1523   CALCIUM 9.5 02/13/2016 1523   GFRNONAA 60 02/21/2015 1236   GFRAA 70 02/21/2015 1236    Lipid Panel     Component Value Date/Time   CHOL 182 02/13/2016 1523   TRIG 90.0 02/13/2016 1523   HDL 46.60 02/13/2016 1523   CHOLHDL 4 02/13/2016 1523   VLDL 18.0 02/13/2016 1523   LDLCALC 117* 02/13/2016 1523    CBC    Component Value Date/Time   WBC 6.9 02/13/2016 1523   RBC 4.13 02/13/2016 1523   HGB 13.5 02/13/2016 1523   HCT 38.8 02/13/2016 1523   PLT 216.0 02/13/2016 1523   MCV 94.0 02/13/2016 1523   MCHC 34.7 02/13/2016 1523   RDW 13.1 02/13/2016 1523    Hgb A1C Lab Results  Component Value Date   HGBA1C 5.1 02/13/2016        Assessment & Plan:   Snoring, insomnia, excessive daytime sleepiness:  She has a copy of her sleep study from 2009, advised her to bring me a copy Neck measurement 15 inches ESS 8/24 ? If this is more stress related- she wants to continue seeing Dr. Rexene Edison and is not interested in medication therapy at this time She would like referral for repeat sleep study- referral to pulmonology placed- see Rosaria Ferries on the way out to schedule  RTC in 7 months for medicare wellness exam/followup Webb Silversmith, NP

## 2016-06-24 NOTE — Progress Notes (Signed)
Pre visit review using our clinic review tool, if applicable. No additional management support is needed unless otherwise documented below in the visit note. 

## 2016-06-26 ENCOUNTER — Telehealth: Payer: Self-pay

## 2016-06-26 NOTE — Telephone Encounter (Signed)
Molly Faulkner [phone] [name])  [name] dropped off her last Sleep Study per your request. Placed on cart.

## 2016-06-26 NOTE — Telephone Encounter (Signed)
Will scan copy into chart

## 2016-07-01 ENCOUNTER — Encounter: Payer: Self-pay | Admitting: Internal Medicine

## 2016-07-01 ENCOUNTER — Ambulatory Visit (INDEPENDENT_AMBULATORY_CARE_PROVIDER_SITE_OTHER): Payer: Commercial Managed Care - HMO | Admitting: Internal Medicine

## 2016-07-01 VITALS — BP 138/70 | HR 72 | Ht 61.0 in | Wt 188.0 lb

## 2016-07-01 DIAGNOSIS — G4733 Obstructive sleep apnea (adult) (pediatric): Secondary | ICD-10-CM | POA: Insufficient documentation

## 2016-07-01 DIAGNOSIS — G4739 Other sleep apnea: Secondary | ICD-10-CM | POA: Diagnosis not present

## 2016-07-01 DIAGNOSIS — G4719 Other hypersomnia: Secondary | ICD-10-CM | POA: Insufficient documentation

## 2016-07-01 DIAGNOSIS — G4700 Insomnia, unspecified: Secondary | ICD-10-CM

## 2016-07-01 DIAGNOSIS — Z9989 Dependence on other enabling machines and devices: Secondary | ICD-10-CM

## 2016-07-01 NOTE — Patient Instructions (Signed)
Follow up with Dr. Juanell Fairly in:4 weeks, after your sleep study, for a one time sleep specialist consultation  Follow up with Dr. Stevenson Clinch in:3 months - cont with diet and exercise - practice proper sleep hygiene.     Sleep Apnea Sleep apnea is disorder that affects a person's sleep. A person with sleep apnea has abnormal pauses in their breathing when they sleep. It is hard for them to get a good sleep. This makes a person tired during the day. It also can lead to other physical problems. There are three types of sleep apnea. One type is when breathing stops for a short time because your airway is blocked (obstructive sleep apnea). Another type is when the brain sometimes fails to give the normal signal to breathe to the muscles that control your breathing (central sleep apnea). The third type is a combination of the other two types. HOME CARE  Do not sleep on your back. Try to sleep on your side.  Take all medicine as told by your doctor.  Avoid alcohol, calming medicines (sedatives), and depressant drugs.  Try to lose weight if you are overweight. Talk to your doctor about a healthy weight goal. Your doctor may have you use a device that helps to open your airway. It can help you get the air that you need. It is called a positive airway pressure (PAP) device. There are three types of PAP devices:  Continuous positive airway pressure (CPAP) device.  Nasal expiratory positive airway pressure (EPAP) device.  Bilevel positive airway pressure (BPAP) device. MAKE SURE YOU:  Understand these instructions.  Will watch your condition.  Will get help right away if you are not doing well or get worse.   This information is not intended to replace advice given to you by your health care provider. Make sure you discuss any questions you have with your health care provider.   Document Released: 09/10/2008 Document Revised: 12/23/2014 Document Reviewed: 04/04/2012 Elsevier Interactive Patient  Education [employer].

## 2016-07-01 NOTE — Assessment & Plan Note (Signed)
Most likely related to poor sleep hygiene along with mixed sleep apnea.  Plan: -Split night sleep study -Avoid/limit use of antihistamines, sleep aids

## 2016-07-01 NOTE — Assessment & Plan Note (Signed)
Known history of insomnia  Plan: -Split night sleep study

## 2016-07-01 NOTE — Assessment & Plan Note (Addendum)
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 Also has a history of restless leg syndrome and periodic leg movement. Only wore CPAP for about a year in 2011. At this time believe that she continues to have insomnia along with sleep apnea. Given the chronicity of her symptoms which include fatigue, tiredness, night sweats, weakness, restlessness, split-night study is warranted. I discussed with the patient proper sleep hygiene at this visit. Educated patient proper use of certain medications include antidepressants, opioids, antihistamines, sleep aids. She does have a supply of mirtazapine and uses it at least once a week; 1/8 th of a tablet; she could not recall the dose.  Plan: -Split night study -One-time follow-up with sleep specialist -Limited use of over-the-counter sleep aids tablets until further evaluation with sleep study and sleep specialist visit

## 2016-07-01 NOTE — Progress Notes (Signed)
[facility] Consultation    Date: 07/01/2016  MRN# [id number] Molly Faulkner Mar 25, 1941  Referring Physician: NP Mel Almond PMD - NP Kandie Tamas is a 75 y.o. old female seen in consultation for sleep apnea evaluation  CC:  Chief Complaint  Patient presents with  . sleep consult    pt ref by Dr. Garnette Gunner    HPI:  She presents today for sleep evaluation of mixed sleep apnea. She states that she had a sleep study done in [place] in 2010, a copy of the report is available at today's visit. Review of the report showed that she had a AHI of 64.1, along with a sleep titration study. Sleep titration study report is not available. Per report, she does have severe sleep apnea, requiring CPAP, also diagnosed with restless leg syndrome, periodic leg movement, central sleep apnea, and obstructive sleep apnea. Patient also states that she has a history of insomnia. Patient states that she's been taking care of her sister who has Alzheimer's dementia and this is been having a lot of stress to her. She endorses fatigue, excessive daytime sleepiness, difficulty to lose weight. Patient states that she did wear her CPAP for about a year in 2011, but given the level of emotional stress during her life, she stopped wearing her CPAP mask. Today she endorses snoring, fatigue, observe apneas, hypertension. Her Epworth sleepiness score is 10. Review of her medications show that she is not currently on any opioid related medications. However, patient stated that she currently has a history of chronic back pain and at times will use Tylenol with codeine. She's also use mirtazapine and Benadryl in the past to assist with her sleep. She states on average she will use 1/8th of a pill of mirtazapine once per week testis with sleep. She cannot recall the dose of her mirtazapine.   PMHX:   Past Medical History  Diagnosis Date  . Asthma   . Arthritis   . Chicken pox   . Depression     . Diverticulitis   . GERD (gastroesophageal reflux disease)   . Allergy   . Hypertension   . Hyperlipidemia   . Hx of colonic polyps   . 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 Insomnia    Surgical Hx:  Past Surgical History  Procedure Laterality Date  . Abdominal hysterectomy  1994    total  . Carpal tunnel release Bilateral   . Breast biopsy Bilateral [id number]   Family Hx:  Family History  Problem Relation Age of Onset  . Breast cancer Mother 56  . Arthritis Maternal Grandmother   . Diabetes Maternal Grandmother    Social Hx:   Social History  Substance Use Topics  . Smoking status: Former Smoker -- 0.50 packs/day for 7 years  . Smokeless tobacco: Never Used     Comment: quit over 40 years ago  . Alcohol Use: Yes     Comment: rare--liquor   Medication:   Current Outpatient Rx  Name  Route  Sig  Dispense  Refill  . ADVAIR HFA 115-21 MCG/ACT inhaler      INHALE 1 PUFF BY MOUTH TWICE DAILY   12 g   11   . albuterol (PROVENTIL HFA;VENTOLIN HFA) 108 (90 BASE) MCG/ACT inhaler   Inhalation   Inhale 2 puffs into the lungs every 6 (six) hours as needed for wheezing or shortness of breath.   1 Inhaler   0   . atenolol (TENORMIN) 50 MG tablet  Take 1 and 1/2 tablets daily. PLEASE SCHEDULE FOLLOW UP OFFICE VISIT FOR September 2017   135 tablet   0   . fluticasone (FLONASE) 50 MCG/ACT nasal spray   Each Nare   Place 2 sprays into both nostrils daily.   48 g   0   . hydrochlorothiazide (MICROZIDE) 12.5 MG capsule   Oral   Take 12.5 mg by mouth daily.         Marland Kitchen losartan (COZAAR) 50 MG tablet      TAKE 1 TABLET (50 MG TOTAL) BY MOUTH 2 (TWO) TIMES DAILY.   180 tablet   3   . Lutein-Zeaxanthin-Selenium 15-4.75-100 MG-MG-MCG CAPS   Oral   Take 1 capsule by mouth daily.          . Omega 3 1000 MG CAPS   Oral   Take 1 capsule by mouth.             Allergies:  Avelox; Clarithromycin; and Trazodone and nefazodone  Review of Systems  Constitutional:  Positive for malaise/fatigue. Negative for fever, chills and diaphoresis.  HENT: Negative for hearing loss.   Eyes: Negative for blurred vision and double vision.  Respiratory: Negative for cough, sputum production and shortness of breath.   Cardiovascular: Negative for chest pain.  Gastrointestinal: Negative for heartburn and vomiting.  Genitourinary: Negative for dysuria.  Musculoskeletal: Negative for myalgias.  Skin: Negative for rash.  Neurological: Negative for dizziness and headaches.  Endo/Heme/Allergies: Negative for environmental allergies. Does not bruise/bleed easily.  Psychiatric/Behavioral: Negative for depression.     Physical Examination:   VS: BP 138/70 mmHg  Pulse 72  Ht 5\' 1"  (1.549 m)  Wt 188 lb (85.276 kg)  BMI 35.54 kg/m2  SpO2 96%  General Appearance: No distress  Neuro:without focal findings, mental status, speech normal, alert and oriented, cranial nerves 2-12 intact, reflexes normal and symmetric, sensation grossly normal  HEENT: PERRLA, EOM intact, no ptosis, no other lesions noticed; Mallampati 3 Pulmonary: normal breath sounds., diaphragmatic excursion normal.No wheezing, No rales;   Sputum Production:  none CardiovascularNormal S1,S2.  No m/r/g.  Abdominal aorta pulsation normal.    Abdomen: Benign, Soft, non-tender, No masses, hepatosplenomegaly, No lymphadenopathy Renal:  No costovertebral tenderness  GU:  No performed at this time. Endoc: No evident thyromegaly, no signs of acromegaly or Cushing features Skin:   warm, no rashes, no ecchymosis  Extremities: normal, no cyanosis, clubbing, no edema, warm with normal capillary refill. Other findings:none      Assessment and Plan:75 year old female in consultation for evaluation of mixed sleep apnea, insomnia. 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 Also has a history of restless leg syndrome and periodic leg  movement. Only wore CPAP for about a year in 2011. At this time believe that she continues to have insomnia along with sleep apnea. Given the chronicity of her symptoms which include fatigue, tiredness, night sweats, weakness, restlessness, split-night study is warranted. I discussed with the patient proper sleep hygiene at this visit. Educated patient proper use of certain medications include antidepressants, opioids, antihistamines, sleep aids. She does have a supply of mirtazapine and uses it at least once a week; 1/8 th of a tablet; she could not recall the dose.  Plan: -Split night study -One-time follow-up with sleep specialist -Limited use of over-the-counter sleep aids tablets until further evaluation with sleep study and sleep specialist visit  Insomnia Known history of insomnia  Plan: -Split night sleep study  Excessive daytime sleepiness Most likely related to poor sleep hygiene along with mixed sleep apnea.  Plan: -Split night sleep study -Avoid/limit use of antihistamines, sleep aids    Updated Medication List Outpatient Encounter Prescriptions as of 07/01/2016  Medication Sig  . ADVAIR HFA 115-21 MCG/ACT inhaler INHALE 1 PUFF BY MOUTH TWICE DAILY  . albuterol (PROVENTIL HFA;VENTOLIN HFA) 108 (90 BASE) MCG/ACT inhaler Inhale 2 puffs into the lungs every 6 (six) hours as needed for wheezing or shortness of breath.  Marland Kitchen atenolol (TENORMIN) 50 MG tablet Take 1 and 1/2 tablets daily. PLEASE SCHEDULE FOLLOW UP OFFICE VISIT FOR September 2017  . fluticasone (FLONASE) 50 MCG/ACT nasal spray Place 2 sprays into both nostrils daily.  . hydrochlorothiazide (MICROZIDE) 12.5 MG capsule Take 12.5 mg by mouth daily.  Marland Kitchen losartan (COZAAR) 50 MG tablet TAKE 1 TABLET (50 MG TOTAL) BY MOUTH 2 (TWO) TIMES DAILY.  Marland Kitchen Lutein-Zeaxanthin-Selenium 15-4.75-100 MG-MG-MCG CAPS Take 1 capsule by mouth daily.   . Omega 3 1000 MG CAPS Take 1 capsule by mouth.  . [DISCONTINUED] Multiple  Vitamins-Minerals (HAIR/SKIN/NAILS PO) Take 1 capsule by mouth every other day. Reported on 07/01/2016   No facility-administered encounter medications on file as of 07/01/2016.    Orders for this visit: Orders Placed This Encounter  Procedures  . Split night study    Standing Status: Future     Number of Occurrences:      Standing Expiration Date: 07/01/2017    Order Specific Question:  Where should this test be performed:    Answer:  Other     Thank  you for the consultation and for allowing [facility] to assist in the care of your patient. Our recommendations are noted above.  Please contact us if we can be of further service.   Vilinda Boehringer, MD [facility] Office Number: [phone]  Note: This note was prepared with Dragon dictation along with smaller phrase technology. Any transcriptional errors that result from this process are unintentional.

## 2016-07-03 ENCOUNTER — Ambulatory Visit (INDEPENDENT_AMBULATORY_CARE_PROVIDER_SITE_OTHER): Payer: Commercial Managed Care - HMO | Admitting: Psychology

## 2016-07-03 DIAGNOSIS — F4322 Adjustment disorder with anxiety: Secondary | ICD-10-CM

## 2016-07-17 ENCOUNTER — Other Ambulatory Visit: Payer: Self-pay

## 2016-07-17 MED ORDER — ATENOLOL 50 MG PO TABS: ORAL_TABLET | ORAL | 0 refills | Status: DC

## 2016-07-17 NOTE — Telephone Encounter (Signed)
Pt left note that atenolol is on manufacturers back order and [facility] does not have med now. Pt request refill from [facility] [address]. Refill done as requested. Pt voiced understanding.

## 2016-08-06 ENCOUNTER — Ambulatory Visit: Payer: Commercial Managed Care - HMO | Attending: Pulmonary Disease

## 2016-08-06 DIAGNOSIS — G4719 Other hypersomnia: Secondary | ICD-10-CM | POA: Diagnosis present

## 2016-08-06 DIAGNOSIS — G4733 Obstructive sleep apnea (adult) (pediatric): Secondary | ICD-10-CM | POA: Diagnosis not present

## 2016-08-06 DIAGNOSIS — I10 Essential (primary) hypertension: Secondary | ICD-10-CM | POA: Insufficient documentation

## 2016-08-07 ENCOUNTER — Ambulatory Visit (INDEPENDENT_AMBULATORY_CARE_PROVIDER_SITE_OTHER): Payer: Commercial Managed Care - HMO | Admitting: Psychology

## 2016-08-07 DIAGNOSIS — F4322 Adjustment disorder with anxiety: Secondary | ICD-10-CM

## 2016-08-09 ENCOUNTER — Telehealth: Payer: Self-pay

## 2016-08-09 DIAGNOSIS — G4719 Other hypersomnia: Secondary | ICD-10-CM

## 2016-08-09 DIAGNOSIS — G4730 Sleep apnea, unspecified: Secondary | ICD-10-CM | POA: Diagnosis not present

## 2016-08-09 DIAGNOSIS — G4739 Other sleep apnea: Secondary | ICD-10-CM

## 2016-08-09 NOTE — Telephone Encounter (Signed)
Spoke with pt in regards to titration study. Pt voiced understanding. Order placed and pt made aware. Nothing further needed.

## 2016-08-09 NOTE — Telephone Encounter (Signed)
Per VM- pt's sleep study shows that she has OSA, and will need a cpap titration study before her office visit with dr. Ashby Dawes on 09/05/16.    lmtcb X1 for pt to make aware of recs.  Will place order for cpap titration study after relaying results/recs to pt.  Will forward to BT triage box to follow up on.

## 2016-09-01 ENCOUNTER — Other Ambulatory Visit: Payer: Self-pay | Admitting: Internal Medicine

## 2016-09-03 NOTE — Telephone Encounter (Signed)
Please advise if pt is to continue medication 

## 2016-09-04 NOTE — Telephone Encounter (Signed)
Yes taking daily for HTN

## 2016-09-05 ENCOUNTER — Institutional Professional Consult (permissible substitution): Payer: Self-pay | Admitting: Internal Medicine

## 2016-09-09 ENCOUNTER — Ambulatory Visit (INDEPENDENT_AMBULATORY_CARE_PROVIDER_SITE_OTHER): Payer: Commercial Managed Care - HMO

## 2016-09-09 DIAGNOSIS — Z23 Encounter for immunization: Secondary | ICD-10-CM

## 2016-09-11 ENCOUNTER — Ambulatory Visit (INDEPENDENT_AMBULATORY_CARE_PROVIDER_SITE_OTHER): Payer: Commercial Managed Care - HMO | Admitting: Psychology

## 2016-09-11 DIAGNOSIS — F4322 Adjustment disorder with anxiety: Secondary | ICD-10-CM | POA: Diagnosis not present

## 2016-09-25 ENCOUNTER — Ambulatory Visit (INDEPENDENT_AMBULATORY_CARE_PROVIDER_SITE_OTHER)
Admission: RE | Admit: 2016-09-25 | Discharge: 2016-09-25 | Disposition: A | Discharge status: Home / Self Care | Payer: Commercial Managed Care - HMO | Source: Ambulatory Visit | Attending: Family Medicine | Admitting: Family Medicine

## 2016-09-25 ENCOUNTER — Encounter: Payer: Self-pay | Admitting: Family Medicine

## 2016-09-25 ENCOUNTER — Ambulatory Visit (INDEPENDENT_AMBULATORY_CARE_PROVIDER_SITE_OTHER): Payer: Commercial Managed Care - HMO | Admitting: Family Medicine

## 2016-09-25 VITALS — BP 120/66 | HR 55 | Temp 98.2°F | Ht 61.0 in | Wt 189.5 lb

## 2016-09-25 DIAGNOSIS — M25532 Pain in left wrist: Secondary | ICD-10-CM | POA: Diagnosis not present

## 2016-09-25 DIAGNOSIS — M654 Radial styloid tenosynovitis [de Quervain]: Secondary | ICD-10-CM

## 2016-09-25 MED ORDER — DICLOFENAC SODIUM 1 % TD GEL: 2.0000 g | Freq: Four times a day (QID) | TRANSDERMAL | 11 refills | Status: DC

## 2016-09-25 NOTE — Progress Notes (Signed)
Dr. Frederico Hamman T. Kosisochukwu Burningham, MD, [facility] [place], [address] Phone: [phone] Fax: [phone]  09/25/2016  Patient: Molly Faulkner, MRN: [id number], DOB: [birth year], 75 y.o.  Primary Physician:  Webb Silversmith, NP   Chief Complaint  Patient presents with  . Knot on Left Wrist   Subjective:   Molly Faulkner is a 75 y.o. very pleasant female patient who presents with the following:  Patient who has noticed a painful bony prominence at the distal radius who presents today. She has had some pain with movement at the thumb for quite a bit longer than this. sshe has never worn a thumb spica splint.  She has never had any intervention such as surgery or injection.  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 There is been no significant trauma or injury.  She has been taking some oral ibuprofen 200 milligrams every other day. She also has been using multiple different topical liniments with some success.  Past Medical History, Surgical History, Social History, Family History, Problem List, Medications, and Allergies have been reviewed and updated if relevant.  Patient Active Problem List   Diagnosis Date Noted  . 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 Excessive daytime sleepiness 07/01/2016  . Hyperlipidemia 02/21/2015  . Insomnia 07/25/2014  . HTN (hypertension) 07/25/2014  . GERD 07/25/2014  . Depression 07/25/2014  . Asthma, moderate persistent, well-controlled 07/25/2014    Past Medical History:  Diagnosis Date  . Allergy   . Arthritis   . Asthma   . Chicken pox   . Depression   . Diverticulitis   . GERD (gastroesophageal reflux disease)   . Hx of colonic polyps   . Hyperlipidemia   . Hypertension   . Insomnia   . 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 ABDOMINAL HYSTERECTOMY  1994   total  . BREAST BIOPSY Bilateral  [id number]  . CARPAL TUNNEL RELEASE Bilateral     Social History   Social History  . Marital status: Single    Spouse name: N/A  . Number of children: N/A  . Years of education: N/A   Occupational History  . Not on file.   Social History Main Topics  . Smoking status: Former Smoker    Packs/day: 0.50    Years: 7.00  . Smokeless tobacco: Never Used     Comment: quit over 40 years ago  . Alcohol use Yes     Comment: rare--liquor  . Drug use: No  . Sexual activity: Not Currently   Other Topics Concern  . Not on file   Social History Narrative  . No narrative on file    Family History  Problem Relation Age of Onset  . Breast cancer Mother 39  . Arthritis Maternal Grandmother   . Diabetes Maternal Grandmother     Allergies  Allergen Reactions  . Avelox [Moxifloxacin Hcl In Nacl] Other (See Comments)    Chills  . Clarithromycin   . Trazodone And Nefazodone Swelling    Medication list reviewed and updated in full in [facility].  GEN: No fevers, chills. Nontoxic. Primarily MSK c/o today. MSK: Detailed in the HPI GI: tolerating PO intake without difficulty Neuro: No numbness, parasthesias, or tingling associated. Otherwise the pertinent positives of the ROS are noted above.   Objective:   BP 120/66   Pulse (!) 55  Temp 98.2 F (36.8 C) (Oral)   Ht 5\' 1"  (1.549 m)   Wt 189 lb 8 oz (86 kg)   BMI 35.81 kg/m    GEN: WDWN, NAD, Non-toxic, Alert & Oriented x 3 HEENT: Atraumatic, Normocephalic.  Ears and Nose: No external deformity. EXTR: No clubbing/cyanosis/edema NEURO: Normal gait.  PSYCH: Normally interactive. Conversant. Not depressed or anxious appearing.  Calm demeanor.   Hand: L Ecchymosis or edema: neg ROM wrist/hand/digits/elbow: 15 deg loss flexion and extension. Carpals, MCP's, digits: NT Distal Ulna and Radius: NT Supination lift test: neg Ecchymosis or edema: neg Cysts/nodules: neg Finkelstein's test:  POS Snuffbox tenderness:  neg Scaphoid tubercle: NT Hook of Hamate: NT Resisted supination: NT Full composite fist Grip, all digits: 5/5 str No tenosynovitis Axial load test: neg Phalen's: neg Tinel's: neg Atrophy: neg Radial styloid is mildly prominent.  Hand sensation: intact   Radiology: Dg Wrist Complete Left  Result Date: 09/25/2016 CLINICAL DATA:  Left wrist pain for several months EXAM: LEFT WRIST - COMPLETE 3+ VIEW COMPARISON:  None. FINDINGS: Four views of the left wrist submitted. No acute fracture or subluxation. There is negative ulnar variance. Narrowing of radiocarpal joint space. IMPRESSION: No acute fracture or subluxation. Narrowing of radiocarpal joint space. Negative ulnar variance. Electronically Signed   By: Lahoma Crocker M.D.   On: 09/25/2016 15:48    Assessment and Plan:   De Quervain's tenosynovitis, left  Left wrist pain - Plan: DG Wrist Complete Left  She does have a relatively prominent radial styloid, but the primary issue is  De Quervain's tenosynovitis.   Suspect that  The radial styloid may  Cause some irritation of the first dorsal compartment, but the de Quervain's may be entirely separate from this.  Anatomical location would be that the first dorsal compartment passes over this region making the patient think that the pain extends from bone rather than  Tendon sheath.  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  Thumb spica splint 3 weeks.  Voltaren gel topically 4 times a day.  If not improved by 4 weeks, recommend follow-up.  At that point we'll consider formal physical therapy and injection into the tendon sheath.  I appreciate the opportunity to evaluate this very friendly patient. If you have any question regarding her care or prognosis, do not hesitate to ask.   New Prescriptions   DICLOFENAC SODIUM (VOLTAREN) 1 % GEL    Apply 2 g topically 4 (four) times daily.   Orders Placed This Encounter  Procedures  . DG Wrist Complete Left    Signed,  Nilton Lave T.  Lovella Hardie, MD   Patient's Medications  New Prescriptions   DICLOFENAC SODIUM (VOLTAREN) 1 % GEL    Apply 2 g topically 4 (four) times daily.  Previous Medications   ADVAIR HFA 115-21 MCG/ACT INHALER    INHALE 1 PUFF BY MOUTH TWICE DAILY   ALBUTEROL (PROVENTIL HFA;VENTOLIN HFA) 108 (90 BASE) MCG/ACT INHALER    Inhale 2 puffs into the lungs every 6 (six) hours as needed for wheezing or shortness of breath.   ATENOLOL (TENORMIN) 50 MG TABLET    Take 1 and 1/2 tablets daily. PLEASE SCHEDULE FOLLOW UP OFFICE VISIT FOR September 2017   HYDROCHLOROTHIAZIDE (MICROZIDE) 12.5 MG CAPSULE    Take 12.5 mg by mouth daily.   LOSARTAN (COZAAR) 50 MG TABLET    TAKE 1 TABLET (50 MG TOTAL) BY MOUTH 2 (TWO) TIMES DAILY.   LUTEIN-ZEAXANTHIN-SELENIUM 15-4.75-100 MG-MG-MCG CAPS    Take  1 capsule by mouth daily.    OMEGA 3 1000 MG CAPS    Take 1 capsule by mouth.  Modified Medications   No medications on file  Discontinued Medications   FLUTICASONE (FLONASE) 50 MCG/ACT NASAL SPRAY    Place 2 sprays into both nostrils daily.   HYDROCHLOROTHIAZIDE (HYDRODIURIL) 25 MG TABLET    TAKE 1 TABLET EVERY DAY

## 2016-09-25 NOTE — Progress Notes (Signed)
Pre visit review using our clinic review tool, if applicable. No additional management support is needed unless otherwise documented below in the visit note. 

## 2016-09-25 NOTE — Patient Instructions (Signed)
DeQuairvain's Tenosynovitis  Wear Thumb Spica Splint for 3 weeks: wear all the time. (OK to take off to shower)  Take antiinflammatories scheduled right now. At least once a day, massage the area above your thumb with an ice cube for 10-20 minutes.  After 3 weeks, wean out of your splint. (Wear less and less each day)  Get a STRESS ball, practice squeezing it. Use a RUBBER BAND, practice opening it with your fingers and thumb.  

## 2016-09-26 ENCOUNTER — Ambulatory Visit (INDEPENDENT_AMBULATORY_CARE_PROVIDER_SITE_OTHER): Payer: Commercial Managed Care - HMO | Admitting: Cardiovascular Disease

## 2016-09-26 ENCOUNTER — Encounter: Payer: Self-pay | Admitting: Cardiovascular Disease

## 2016-09-26 VITALS — BP 130/70 | HR 59 | Ht 61.0 in | Wt 190.5 lb

## 2016-09-26 DIAGNOSIS — I10 Essential (primary) hypertension: Secondary | ICD-10-CM

## 2016-09-26 DIAGNOSIS — G4719 Other hypersomnia: Secondary | ICD-10-CM

## 2016-09-26 DIAGNOSIS — E7800 Pure hypercholesterolemia, unspecified: Secondary | ICD-10-CM | POA: Diagnosis not present

## 2016-09-26 NOTE — Patient Instructions (Signed)
Medication Instructions:   Please decrease the atenolol down to 50 mg daily Wait a few weeks If blood pressure well controlled, Consider decreasing the atenolol down to a 1/2 pill  VitD !!   Labwork:  No new labs needed  Testing/Procedures:  No further testing at this time   Follow-Up: It was a pleasure seeing you in the office today. Please call us if you have new issues that need to be addressed before your next appt.  [phone]  Your physician wants you to follow-up in: 12 months.  You will receive a reminder letter in the mail two months in advance. If you don't receive a letter, please call our office to schedule the follow-up appointment.  If you need a refill on your cardiac medications before your next appointment, please call your pharmacy.

## 2016-09-26 NOTE — Progress Notes (Signed)
Maybe I will go to gym Cardiology Office Note  Date:  09/26/2016   ID:  Molly Faulkner, DOB 24-Jan-1941, MRN [id number]  PCP:  Webb Silversmith, NP   Chief Complaint  Patient presents with  . other    1 yr f/u . Meds reviewed verbally with pt.    HPI:  Molly Faulkner is a very pleasant 75 year old woman with history of hypertension, previous symptoms of syncope, tachycardia who presents for follow-up of her hypertension and tachycardia   she reports that she has had no further episodes of near syncope or syncope She has significant stress at home, takes care of her sister who has dementia. She takes care of her sister daytime as well as nighttime, has disrupted sleep  Denies any tachycardia symptoms now that she has stopped working Fatigue is a major issue  She takes losartan 50 g twice a day, atenolol 75 mg in the morning, HCTZ 12.5 mill grams daily  EKG on today's visit shows normal sinus rhythm with rate 59 bpm,  no significant ST or T-wave changes  Other past medical history  February 2016 she was on a plane going to [place]. She had not drink much fluids, had a breakfast bar and a bottle of water. She had a queasy feeling in her stomach while on the plane, she got up the window seat, moved into the asle, when she developed lightheadedness, dizziness and acute syncope. She feels that she was only out for several seconds before she came to on the ground. She sat there for several minutes until she felt better, sat and had some juice for several minutes, felt better and then went to the bathroom in the back of the plane. Rest of the trip was uneventful.  10 days later approximately she woke up with tachycardia, February 09, 2015, measured her heart rate at 110 bpm, blood pressure also elevated. She felt general malaise, felt weird She rested and eventually after over one hour, heart rate came down, blood pressure came down. She went to see primary care and on arrival to their office, she  reports that her palpitations resolved. EKG at that time showed heart rate in the 60s.   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  PSH:    Past Surgical History:  Procedure Laterality Date  . ABDOMINAL HYSTERECTOMY  1994   total  . BREAST BIOPSY Bilateral [id number]  . CARPAL TUNNEL RELEASE Bilateral     Current Outpatient Prescriptions  Medication Sig Dispense Refill  . ADVAIR HFA 115-21 MCG/ACT inhaler INHALE 1 PUFF BY MOUTH TWICE DAILY 12 g 11  . albuterol (PROVENTIL HFA;VENTOLIN HFA) 108 (90 BASE) MCG/ACT inhaler Inhale 2 puffs into the lungs every 6 (six) hours as needed for wheezing or shortness of breath. 1 Inhaler 0  . atenolol (TENORMIN) 50 MG tablet Take 1 and 1/2 tablets daily. PLEASE SCHEDULE FOLLOW UP OFFICE VISIT FOR September 2017 135 tablet 0  . diclofenac sodium (VOLTAREN) 1 % GEL Apply 2 g topically 4 (four) times daily. 3 Tube 11  . hydrochlorothiazide (MICROZIDE) 12.5 MG capsule Take 12.5 mg by mouth daily.    Marland Kitchen losartan (COZAAR) 50 MG tablet TAKE 1 TABLET (50 MG TOTAL) BY MOUTH 2 (TWO) TIMES DAILY. 180 tablet 3  . Lutein-Zeaxanthin-Selenium 15-4.75-100 MG-MG-MCG CAPS Take 1 capsule by mouth daily.     Ernestine Conrad  3 1000 MG CAPS Take 1 capsule by mouth.     No current facility-administered medications for this visit.      Allergies:   Avelox [moxifloxacin hcl in nacl]; Clarithromycin; and Trazodone and nefazodone   Social History:  The patient  reports that she has quit smoking. She has a 3.50 pack-year smoking history. She has never used smokeless tobacco. She reports that she drinks alcohol. She reports that she does not use drugs.   Family History:   family history includes Arthritis in her maternal grandmother; Breast cancer (age of onset: 7) in her mother; Diabetes in her  maternal grandmother.    Review of Systems: Review of Systems  Constitutional: Negative.   Respiratory: Negative.   Cardiovascular: Negative.   Gastrointestinal: Negative.   Musculoskeletal: Negative.   Neurological: Negative.   Psychiatric/Behavioral: Negative.   All other systems reviewed and are negative.    PHYSICAL EXAM: VS:  BP 130/70 (BP Location: Left Arm, Patient Position: Sitting, Cuff Size: Large)   Pulse (!) 59   Ht 5\' 1"  (1.549 m)   Wt 190 lb 8 oz (86.4 kg)   BMI 35.99 kg/m  , BMI Body mass index is 35.99 kg/m. GEN: Well nourished, well developed, in no acute distress, obese  HEENT: normal  Neck: no JVD, carotid bruits, or masses Cardiac: RRR; no murmurs, rubs, or gallops,no edema  Respiratory:  clear to auscultation bilaterally, normal work of breathing GI: soft, nontender, nondistended, + BS MS: no deformity or atrophy  Skin: warm and dry, no rash Neuro:  Strength and sensation are intact Psych: euthymic mood, full affect    Recent Labs: 02/13/2016: ALT 18; BUN 15; Creatinine, Ser 0.88; Hemoglobin 13.5; Platelets 216.0; Potassium 4.1; Sodium 138    Lipid Panel Lab Results  Component Value Date   CHOL 182 02/13/2016   HDL 46.60 02/13/2016   LDLCALC 117 (H) 02/13/2016   TRIG 90.0 02/13/2016      Wt Readings from Last 3 Encounters:  09/26/16 190 lb 8 oz (86.4 kg)  09/25/16 189 lb 8 oz (86 kg)  07/01/16 188 lb (85.3 kg)       ASSESSMENT AND PLAN:  Essential hypertension - Plan: EKG 12-Lead Recommended she decrease atenolol down to 50 mg daily for bradycardia. Could even consider 25 mg daily. Blood pressure well controlled  Pure hypercholesterolemia Currently not on any cholesterol medication. Recommended weight loss, exercise  Excessive daytime sleepiness Reports she is been diagnosed with sleep apnea, has not completed phase II of the sleep study. Also has poor sleep hygiene given she is taking care of sister who has dementia   Total  encounter time more than 15 minutes  Greater than 50% was spent in counseling and coordination of care with the patient   Disposition:   F/U  12 months as needed   Orders Placed This Encounter  Procedures  . EKG 12-Lead     Signed, Esmond Plants, M.D., Ph.D. 09/26/2016  [place], [place]

## 2016-10-09 ENCOUNTER — Ambulatory Visit: Payer: Commercial Managed Care - HMO | Admitting: Psychology

## 2016-10-18 ENCOUNTER — Ambulatory Visit (INDEPENDENT_AMBULATORY_CARE_PROVIDER_SITE_OTHER): Payer: Commercial Managed Care - HMO | Admitting: Psychology

## 2016-10-18 DIAGNOSIS — F4322 Adjustment disorder with anxiety: Secondary | ICD-10-CM | POA: Diagnosis not present

## 2016-10-23 ENCOUNTER — Telehealth: Payer: Self-pay | Admitting: Internal Medicine

## 2016-10-23 NOTE — Telephone Encounter (Signed)
Pt is calling inquiring about "the 2nd part of my sleep study", gettting it scheduled. please

## 2016-10-24 NOTE — Telephone Encounter (Signed)
Called and spoke with patient and advised her that according to [facility] they have reached out to her and that she advised them that she would call them back to R/S.  Gave patient [facility]'s phone number of [phone] opt 4 to speak with Ria Comment at [facility]. Advised patient that if she still didn't get appointment for the CPAP Titration Study to return our call, but Ria Comment usually calls patient's back the same day or the next. Pt voiced understanding and stated that she would contact her to schedule. Nothing else needed at this time. Rhonda J Cobb

## 2016-10-24 NOTE — Telephone Encounter (Signed)
LMOM for pt letting her know that Suanne Marker has LM to [facility] to follow up on when it will be scheduled. Will wait a call back from [facility].

## 2016-10-24 NOTE — Telephone Encounter (Signed)
Suanne Marker heard back from [facility] and they state that they LM for pt on 8/22, LM on 9/2 and on 9/8 spoke with pt and she stated she wasn't able to schedule at that time so they told her to call them back.   I have LMOM for pt to return my call.

## 2016-10-31 ENCOUNTER — Encounter: Payer: Self-pay | Admitting: Internal Medicine

## 2016-10-31 ENCOUNTER — Ambulatory Visit (INDEPENDENT_AMBULATORY_CARE_PROVIDER_SITE_OTHER): Payer: Commercial Managed Care - HMO | Admitting: Internal Medicine

## 2016-10-31 VITALS — BP 122/78 | HR 80 | Temp 99.4°F | Wt 191.8 lb

## 2016-10-31 DIAGNOSIS — J029 Acute pharyngitis, unspecified: Secondary | ICD-10-CM | POA: Diagnosis not present

## 2016-10-31 LAB — POCT RAPID STREP A (OFFICE): RAPID STREP A SCREEN: NEGATIVE

## 2016-10-31 NOTE — Progress Notes (Signed)
Subjective:    Patient ID: Molly Faulkner, female    DOB: [birth year], 75 y.o.   MRN: [id number]  HPI  Pt presents to the clinic today with c/o runny nose and sore throat. This started yesterday. She is blowing clear mucous out of her nose. She is having some pain with swallowing. She is running low grade fever, but denies chills or body aches. She has tried warm salt water gargles with some relief. She has a history of seasonal allergies and has taken Claritin without any relief. She has not had sick contacts that she is aware of.  Review of Systems      Past Medical History:  Diagnosis Date  . Allergy   . Arthritis   . Asthma   . Chicken pox   . Depression   . Diverticulitis   . GERD (gastroesophageal reflux disease)   . Hx of colonic polyps   . Hyperlipidemia   . Hypertension   . Insomnia   . 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 ADVAIR HFA 115-21 MCG/ACT inhaler INHALE 1 PUFF BY MOUTH TWICE DAILY 12 g 11  . albuterol (PROVENTIL HFA;VENTOLIN HFA) 108 (90 BASE) MCG/ACT inhaler Inhale 2 puffs into the lungs every 6 (six) hours as needed for wheezing or shortness of breath. 1 Inhaler 0  . atenolol (TENORMIN) 50 MG tablet Take 1 and 1/2 tablets daily. PLEASE SCHEDULE FOLLOW UP OFFICE VISIT FOR September 2017 (Patient taking differently: 50 mg. Take 1 and 1/2 tablets daily. PLEASE SCHEDULE FOLLOW UP OFFICE VISIT FOR September 2017) 135 tablet 0  . hydrochlorothiazide (MICROZIDE) 12.5 MG capsule Take 12.5 mg by mouth daily.    Marland Kitchen losartan (COZAAR) 50 MG tablet TAKE 1 TABLET (50 MG TOTAL) BY MOUTH 2 (TWO) TIMES DAILY. 180 tablet 3  . Lutein-Zeaxanthin-Selenium 15-4.75-100 MG-MG-MCG CAPS Take 1 capsule by mouth daily.     . Omega 3 1000 MG CAPS Take 1 capsule by mouth.    . diclofenac sodium (VOLTAREN) 1 % GEL Apply 2 g topically 4 (four) times daily. (Patient not taking: Reported on 10/31/2016) 3 Tube 11   No current  facility-administered medications for this visit.     Allergies  Allergen Reactions  . Avelox [Moxifloxacin Hcl In Nacl] Other (See Comments)    Chills  . Clarithromycin   . Trazodone And Nefazodone Swelling    Family History  Problem Relation Age of Onset  . Breast cancer Mother 57  . Arthritis Maternal Grandmother   . Diabetes Maternal Grandmother     Social History   Social History  . Marital status: Single    Spouse name: N/A  . Number of children: N/A  . Years of education: N/A   Occupational History  . Not on file.   Social History Main Topics  . Smoking status: Former Smoker    Packs/day: 0.50    Years: 7.00  . Smokeless tobacco: Never Used     Comment: quit over 40 years ago  . Alcohol use Yes     Comment: rare--liquor  . Drug use: No  . Sexual activity: Not Currently   Other Topics Concern  . Not on file   Social History Narrative  . No narrative on file     Constitutional: Denies fever, malaise, fatigue, headache or abrupt weight changes.  HEENT: Pt reports runny nose, sore throat. Denies eye pain, eye redness, ear pain, ringing in the ears, wax buildup,  nasal congestion, bloody noset. Respiratory: Denies difficulty breathing, shortness of breath, cough or sputum production.    No other specific complaints in a complete review of systems (except as listed in HPI above).  Objective:   Physical Exam   BP 122/78   Pulse 80   Temp 99.4 F (37.4 C) (Oral)   Wt 191 lb 12 oz (87 kg)   SpO2 98%   BMI 36.23 kg/m   Wt Readings from Last 3 Encounters:  10/31/16 191 lb 12 oz (87 kg)  09/26/16 190 lb 8 oz (86.4 kg)  09/25/16 189 lb 8 oz (86 kg)    General: Appears her stated age, in NAD. HEENT: Head: normal shape and size; Ears: Tm's gray and intact, normal light reflex; Nose: mucosa pink and moist, septum midline; Throat/Mouth: Teeth present, mucosa erythematous and moist, + PND, no exudate, lesions or ulcerations noted.  Neck:  No adenopathy  noted.  Cardiovascular: Normal rate and rhythm. S1,S2 noted.  Pulmonary/Chest: Normal effort and positive vesicular breath sounds. No respiratory distress. No wheezes, rales or ronchi noted.    BMET    Component Value Date/Time   NA 138 02/13/2016 1523   NA 140 02/21/2015 1236   K 4.1 02/13/2016 1523   CL 106 02/13/2016 1523   CO2 29 02/13/2016 1523   GLUCOSE 102 (H) 02/13/2016 1523   BUN 15 02/13/2016 1523   BUN 14 02/21/2015 1236   CREATININE 0.88 02/13/2016 1523   CALCIUM 9.5 02/13/2016 1523   GFRNONAA 60 02/21/2015 1236   GFRAA 70 02/21/2015 1236    Lipid Panel     Component Value Date/Time   CHOL 182 02/13/2016 1523   TRIG 90.0 02/13/2016 1523   HDL 46.60 02/13/2016 1523   CHOLHDL 4 02/13/2016 1523   VLDL 18.0 02/13/2016 1523   LDLCALC 117 (H) 02/13/2016 1523    CBC    Component Value Date/Time   WBC 6.9 02/13/2016 1523   RBC 4.13 02/13/2016 1523   HGB 13.5 02/13/2016 1523   HCT 38.8 02/13/2016 1523   PLT 216.0 02/13/2016 1523   MCV 94.0 02/13/2016 1523   MCHC 34.7 02/13/2016 1523   RDW 13.1 02/13/2016 1523    Hgb A1C Lab Results  Component Value Date   HGBA1C 5.1 02/13/2016           Assessment & Plan:   Viral Pharyngitis:  RST: negative Start taking Ibuprofen OTC Continue warm salt water gargles Continue Claritin  RTC as needed or if symptoms persist or worsen BAITY, REGINA, NP

## 2016-10-31 NOTE — Patient Instructions (Signed)
Sore Throat When you have a sore throat, your throat may:  Hurt.  Burn.  Feel irritated.  Feel scratchy. Many things can cause a sore throat, including:  An infection.  Allergies.  Dryness in the air.  Smoke or pollution.  Gastroesophageal reflux disease (GERD).  A tumor. A sore throat can be the first sign of another sickness. It can happen with other problems, like coughing or a fever. Most sore throats go away without treatment. Follow these instructions at home:  Take over-the-counter medicines only as told by your doctor.  Drink enough fluids to keep your pee (urine) clear or pale yellow.  Rest when you feel you need to.  To help with pain, try:  Sipping warm liquids, such as broth, herbal tea, or warm water.  Eating or drinking cold or frozen liquids, such as frozen ice pops.  Gargling with a salt-water mixture 3-4 times a day or as needed. To make a salt-water mixture, add -1 tsp of salt in 1 cup of warm water. Mix it until you cannot see the salt anymore.  Sucking on hard candy or throat lozenges.  Putting a cool-mist humidifier in your bedroom at night.  Sitting in the bathroom with the door closed for 5-10 minutes while you run hot water in the shower.  Do not use any tobacco products, such as cigarettes, chewing tobacco, and e-cigarettes. If you need help quitting, ask your doctor. Contact a doctor if:  You have a fever for more than 2-3 days.  You keep having symptoms for more than 2-3 days.  Your throat does not get better in 7 days.  You have a fever and your symptoms suddenly get worse. Get help right away if:  You have trouble breathing.  You cannot swallow fluids, soft foods, or your saliva.  You have swelling in your throat or neck that gets worse.  You keep feeling like you are going to throw up (vomit).  You keep throwing up. This information is not intended to replace advice given to you by your health care provider. Make sure  you discuss any questions you have with your health care provider. Document Released: 09/10/2008 Document Revised: 07/28/2016 Document Reviewed: 09/22/2015 Elsevier Interactive Patient Education  2017 [employer]  

## 2016-10-31 NOTE — Addendum Note (Signed)
Addended by: Lurlean Nanny on: 10/31/2016 03:39 PM   Modules accepted: Orders

## 2016-11-12 ENCOUNTER — Ambulatory Visit: Payer: Commercial Managed Care - HMO | Attending: Pulmonary Disease

## 2016-11-12 DIAGNOSIS — G4761 Periodic limb movement disorder: Secondary | ICD-10-CM | POA: Diagnosis not present

## 2016-11-12 DIAGNOSIS — G4733 Obstructive sleep apnea (adult) (pediatric): Secondary | ICD-10-CM | POA: Insufficient documentation

## 2016-11-15 ENCOUNTER — Ambulatory Visit: Payer: Commercial Managed Care - HMO | Admitting: Psychology

## 2016-11-26 ENCOUNTER — Telehealth: Payer: Self-pay

## 2016-11-26 DIAGNOSIS — G4733 Obstructive sleep apnea (adult) (pediatric): Secondary | ICD-10-CM

## 2016-11-26 NOTE — Telephone Encounter (Signed)
Order placed. Pt aware & voiced understanding.  Nothing further needed.

## 2016-11-26 NOTE — Telephone Encounter (Signed)
-----   Message from Laverle Hobby, MD sent at 11/26/2016 11:19 AM EST ----- Regarding: sleep study In lab titration study complete:   Recommended CPAP of 10  cmH2O;  P10 nasal Pillows Size Small

## 2016-12-04 ENCOUNTER — Ambulatory Visit (INDEPENDENT_AMBULATORY_CARE_PROVIDER_SITE_OTHER): Payer: Commercial Managed Care - HMO | Admitting: Psychology

## 2016-12-04 DIAGNOSIS — F4322 Adjustment disorder with anxiety: Secondary | ICD-10-CM | POA: Diagnosis not present

## 2016-12-25 DIAGNOSIS — G4733 Obstructive sleep apnea (adult) (pediatric): Secondary | ICD-10-CM | POA: Diagnosis not present

## 2016-12-26 ENCOUNTER — Ambulatory Visit (INDEPENDENT_AMBULATORY_CARE_PROVIDER_SITE_OTHER): Payer: Commercial Managed Care - HMO | Admitting: Internal Medicine

## 2016-12-26 ENCOUNTER — Encounter: Payer: Self-pay | Admitting: Internal Medicine

## 2016-12-26 VITALS — BP 132/70 | HR 53 | Temp 99.0°F | Wt 197.0 lb

## 2016-12-26 DIAGNOSIS — G4709 Other insomnia: Secondary | ICD-10-CM | POA: Diagnosis not present

## 2016-12-26 DIAGNOSIS — R195 Other fecal abnormalities: Secondary | ICD-10-CM | POA: Diagnosis not present

## 2016-12-26 DIAGNOSIS — Z636 Dependent relative needing care at home: Secondary | ICD-10-CM | POA: Diagnosis not present

## 2016-12-26 NOTE — Progress Notes (Signed)
Subjective:    Patient ID: Molly Faulkner, female    DOB: August 27, 1941, 76 y.o.   MRN: [id number]  HPI  Pt presents to the clinic today with fatigue and difficulty sleeping. This has been an ongoing issue for over a year now. She is caring for her sister with Alzheimer's who often wakes up multiple times in the middle of the night. She has a caregiver that comes in 3 nights a week, so she can get about 6 hours of sleep. She has trouble falling and staying asleep. She has Remeron (which is her sisters) which she will take 1/4 tablet at night occasionally to help her sleep. She is not sure if she is interested in taking anything additional for her insomnia.  She also reports frequent loose stools. This has been an ongoing issue over the last 6 months, but worse in the last 2 months. She denies changes in color, abnormal odor or blood in her stool. She denies reflux, nausea, vomiting, or abdominal pain. She has a feeling it is stress related, because it seems worse during that time. She has not taken any OTC for this.  She also has multiple questions regarding her sister, Nadean Corwin, who is also my patient.  1. She wants to discuss long term care placement options. 2. She wants to discuss Protonix and her side effects.  Review of Systems      Past Medical History:  Diagnosis Date  . Allergy   . Arthritis   . Asthma   . Chicken pox   . Depression   . Diverticulitis   . GERD (gastroesophageal reflux disease)   . Hx of colonic polyps   . Hyperlipidemia   . Hypertension   . Insomnia   . 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 ADVAIR HFA 115-21 MCG/ACT inhaler INHALE 1 PUFF BY MOUTH TWICE DAILY 12 g 11  . albuterol (PROVENTIL HFA;VENTOLIN HFA) 108 (90 BASE) MCG/ACT inhaler Inhale 2 puffs into the lungs every 6 (six) hours as needed for wheezing or shortness of breath. 1 Inhaler 0  . atenolol (TENORMIN) 50 MG tablet Take 1 and 1/2  tablets daily. PLEASE SCHEDULE FOLLOW UP OFFICE VISIT FOR September 2017 (Patient taking differently: 50 mg. Take 1 and 1/2 tablets daily. PLEASE SCHEDULE FOLLOW UP OFFICE VISIT FOR September 2017) 135 tablet 0  . hydrochlorothiazide (MICROZIDE) 12.5 MG capsule Take 12.5 mg by mouth daily.    Marland Kitchen losartan (COZAAR) 50 MG tablet TAKE 1 TABLET (50 MG TOTAL) BY MOUTH 2 (TWO) TIMES DAILY. 180 tablet 3  . Lutein-Zeaxanthin-Selenium 15-4.75-100 MG-MG-MCG CAPS Take 1 capsule by mouth daily.     . Omega 3 1000 MG CAPS Take 1 capsule by mouth.     No current facility-administered medications for this visit.     Allergies  Allergen Reactions  . Avelox [Moxifloxacin Hcl In Nacl] Other (See Comments)    Chills  . Clarithromycin   . Trazodone And Nefazodone Swelling    Family History  Problem Relation Age of Onset  . Breast cancer Mother 43  . Arthritis Maternal Grandmother   . Diabetes Maternal Grandmother     Social History   Social History  . Marital status: Single    Spouse name: N/A  . Number of children: N/A  . Years of education: N/A   Occupational History  . Not on file.   Social History Main Topics  . Smoking status: Former  Smoker    Packs/day: 0.50    Years: 7.00  . Smokeless tobacco: Never Used     Comment: quit over 40 years ago  . Alcohol use Yes     Comment: rare--liquor  . Drug use: No  . Sexual activity: Not Currently   Other Topics Concern  . Not on file   Social History Narrative  . No narrative on file     Constitutional: Pt reports fatigue. Denies fever, malaise, headache or abrupt weight changes.  Gastrointestinal: Pt reports frequent stools. Denies abdominal pain, bloating, constipation, diarrhea or blood in the stool.  Neurological: Pt reports insomnia. dizziness, difficulty with memory, difficulty with speech or problems with balance and coordination.    No other specific complaints in a complete review of systems (except as listed in HPI  above).  Objective:   Physical Exam   BP 132/70   Pulse (!) 53   Temp 99 F (37.2 C) (Oral)   Wt 197 lb (89.4 kg)   SpO2 97%   BMI 37.22 kg/m  Wt Readings from Last 3 Encounters:  12/26/16 197 lb (89.4 kg)  10/31/16 191 lb 12 oz (87 kg)  09/26/16 190 lb 8 oz (86.4 kg)    General: Appears her stated age, in NAD. Abdomen: Soft and nontender. Normal bowel sounds. No distention or masses noted.  Neurological: Alert and oriented. Psychiatric: She appears stress, sleep deprived and worn out.  BMET    Component Value Date/Time   NA 138 02/13/2016 1523   NA 140 02/21/2015 1236   K 4.1 02/13/2016 1523   CL 106 02/13/2016 1523   CO2 29 02/13/2016 1523   GLUCOSE 102 (H) 02/13/2016 1523   BUN 15 02/13/2016 1523   BUN 14 02/21/2015 1236   CREATININE 0.88 02/13/2016 1523   CALCIUM 9.5 02/13/2016 1523   GFRNONAA 60 02/21/2015 1236   GFRAA 70 02/21/2015 1236    Lipid Panel     Component Value Date/Time   CHOL 182 02/13/2016 1523   TRIG 90.0 02/13/2016 1523   HDL 46.60 02/13/2016 1523   CHOLHDL 4 02/13/2016 1523   VLDL 18.0 02/13/2016 1523   LDLCALC 117 (H) 02/13/2016 1523    CBC    Component Value Date/Time   WBC 6.9 02/13/2016 1523   RBC 4.13 02/13/2016 1523   HGB 13.5 02/13/2016 1523   HCT 38.8 02/13/2016 1523   PLT 216.0 02/13/2016 1523   MCV 94.0 02/13/2016 1523   MCHC 34.7 02/13/2016 1523   RDW 13.1 02/13/2016 1523    Hgb A1C Lab Results  Component Value Date   HGBA1C 5.1 02/13/2016           Assessment & Plan:   Insomnia:  Secondary to caregiver stress She wants to continue Remeron prn (although I don't know if 1/4 tab is really effective) She does not want to take daily or at full dose because she is concerned about side effects Will monitor  Frequent Stools secondary to Stress:  Discussed stress management techniques Discussed started Lexapro to help managed stress, but she is not interested at this time Will monitor  We also spent  about 20 minutes discussing concerns about her sister. All of her questions were answered.  RTC at your Medicare Wellness Exam Webb Silversmith, NP

## 2016-12-27 ENCOUNTER — Encounter: Payer: Self-pay | Admitting: Internal Medicine

## 2016-12-27 NOTE — Patient Instructions (Signed)
Stress and Stress Management Stress is a normal reaction to life events. It is what you feel when life demands more than you are used to or more than you can handle. Some stress can be useful. For example, the stress reaction can help you catch the last bus of the day, study for a test, or meet a deadline at work. But stress that occurs too often or for too long can cause problems. It can affect your emotional health and interfere with relationships and normal daily activities. Too much stress can weaken your immune system and increase your risk for physical illness. If you already have a medical problem, stress can make it worse. What are the causes? All sorts of life events may cause stress. An event that causes stress for one person may not be stressful for another person. Major life events commonly cause stress. These may be positive or negative. Examples include losing your job, moving into a new home, getting married, having a baby, or losing a loved one. Less obvious life events may also cause stress, especially if they occur day after day or in combination. Examples include working long hours, driving in traffic, caring for children, being in debt, or being in a difficult relationship. What are the signs or symptoms? Stress may cause emotional symptoms including, the following:  Anxiety. This is feeling worried, afraid, on edge, overwhelmed, or out of control.  Anger. This is feeling irritated or impatient.  Depression. This is feeling sad, down, helpless, or guilty.  Difficulty focusing, remembering, or making decisions. Stress may cause physical symptoms, including the following:  Aches and pains. These may affect your head, neck, back, stomach, or other areas of your body.  Tight muscles or clenched jaw.  Low energy or trouble sleeping. Stress may cause unhealthy behaviors, including the following:  Eating to feel better (overeating) or skipping meals.  Sleeping too little, too  much, or both.  Working too much or putting off tasks (procrastination).  Smoking, drinking alcohol, or using drugs to feel better. How is this diagnosed? Stress is diagnosed through an assessment by your health care provider. Your health care provider will ask questions about your symptoms and any stressful life events.Your health care provider will also ask about your medical history and may order blood tests or other tests. Certain medical conditions and medicine can cause physical symptoms similar to stress. Mental illness can cause emotional symptoms and unhealthy behaviors similar to stress. Your health care provider may refer you to a mental health professional for further evaluation. How is this treated? Stress management is the recommended treatment for stress.The goals of stress management are reducing stressful life events and coping with stress in healthy ways. Techniques for reducing stressful life events include the following:  Stress identification. Self-monitor for stress and identify what causes stress for you. These skills may help you to avoid some stressful events.  Time management. Set your priorities, keep a calendar of events, and learn to say "no." These tools can help you avoid making too many commitments. Techniques for coping with stress include the following:  Rethinking the problem. Try to think realistically about stressful events rather than ignoring them or overreacting. Try to find the positives in a stressful situation rather than focusing on the negatives.  Exercise. Physical exercise can release both physical and emotional tension. The key is to find a form of exercise you enjoy and do it regularly.  Relaxation techniques. These relax the body and mind. Examples include yoga,  meditation, tai chi, biofeedback, deep breathing, progressive muscle relaxation, listening to music, being out in nature, journaling, and other hobbies. Again, the key is to find one or  more that you enjoy and can do regularly.  Healthy lifestyle. Eat a balanced diet, get plenty of sleep, and do not smoke. Avoid using alcohol or drugs to relax.  Strong support network. Spend time with family, friends, or other people you enjoy being around.Express your feelings and talk things over with someone you trust. Counseling or talktherapy with a mental health professional may be helpful if you are having difficulty managing stress on your own. Medicine is typically not recommended for the treatment of stress.Talk to your health care provider if you think you need medicine for symptoms of stress. Follow these instructions at home:  Keep all follow-up visits as directed by your health care provider.  Take all medicines as directed by your health care provider. Contact a health care provider if:  Your symptoms get worse or you start having new symptoms.  You feel overwhelmed by your problems and can no longer manage them on your own. Get help right away if:  You feel like hurting yourself or someone else. This information is not intended to replace advice given to you by your health care provider. Make sure you discuss any questions you have with your health care provider. Document Released: 05/28/2001 Document Revised: 05/09/2016 Document Reviewed: 07/27/2013 Elsevier Interactive Patient Education  2017 [employer].

## 2017-01-02 ENCOUNTER — Ambulatory Visit: Payer: Commercial Managed Care - HMO | Admitting: Psychology

## 2017-01-08 ENCOUNTER — Telehealth: Payer: Self-pay

## 2017-01-08 NOTE — Telephone Encounter (Signed)
Patient wants to talk about cpap .  Insurance will not cover unless she uses this for 4 hrs or more.  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  Patient wants to know if this is beneficial enough that it is worth while to pay out of pocket if insurance does not cover.  Please call.

## 2017-01-09 NOTE — Telephone Encounter (Signed)
LMOM for pt to return call. 

## 2017-01-10 NOTE — Telephone Encounter (Signed)
LMOM for pt to return call. 

## 2017-01-15 NOTE — Telephone Encounter (Signed)
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 Informed pt to wear CPAP anytime she slept and then to call back in 2-4 weeks and we can look to see how much she is wearing it nightly. Nothing further needed.

## 2017-01-15 NOTE — Telephone Encounter (Signed)
LMOVM for pt to return call 

## 2017-01-25 DIAGNOSIS — G4733 Obstructive sleep apnea (adult) (pediatric): Secondary | ICD-10-CM | POA: Diagnosis not present

## 2017-02-05 DIAGNOSIS — H2513 Age-related nuclear cataract, bilateral: Secondary | ICD-10-CM | POA: Diagnosis not present

## 2017-02-13 ENCOUNTER — Ambulatory Visit (INDEPENDENT_AMBULATORY_CARE_PROVIDER_SITE_OTHER): Payer: Medicare HMO | Admitting: Psychology

## 2017-02-13 DIAGNOSIS — F4323 Adjustment disorder with mixed anxiety and depressed mood: Secondary | ICD-10-CM

## 2017-02-20 ENCOUNTER — Ambulatory Visit (INDEPENDENT_AMBULATORY_CARE_PROVIDER_SITE_OTHER): Payer: Medicare HMO | Admitting: Internal Medicine

## 2017-02-20 ENCOUNTER — Encounter: Payer: Self-pay | Admitting: Internal Medicine

## 2017-02-20 VITALS — BP 128/82 | HR 54 | Temp 98.6°F | Ht 61.0 in | Wt 193.5 lb

## 2017-02-20 DIAGNOSIS — Z13820 Encounter for screening for osteoporosis: Secondary | ICD-10-CM | POA: Diagnosis not present

## 2017-02-20 DIAGNOSIS — Z0000 Encounter for general adult medical examination without abnormal findings: Secondary | ICD-10-CM | POA: Diagnosis not present

## 2017-02-20 DIAGNOSIS — J301 Allergic rhinitis due to pollen: Secondary | ICD-10-CM | POA: Diagnosis not present

## 2017-02-20 DIAGNOSIS — G4739 Other sleep apnea: Secondary | ICD-10-CM | POA: Diagnosis not present

## 2017-02-20 DIAGNOSIS — H9193 Unspecified hearing loss, bilateral: Secondary | ICD-10-CM | POA: Diagnosis not present

## 2017-02-20 DIAGNOSIS — I10 Essential (primary) hypertension: Secondary | ICD-10-CM | POA: Diagnosis not present

## 2017-02-20 DIAGNOSIS — F329 Major depressive disorder, single episode, unspecified: Secondary | ICD-10-CM

## 2017-02-20 DIAGNOSIS — J4540 Moderate persistent asthma, uncomplicated: Secondary | ICD-10-CM

## 2017-02-20 DIAGNOSIS — K219 Gastro-esophageal reflux disease without esophagitis: Secondary | ICD-10-CM | POA: Diagnosis not present

## 2017-02-20 DIAGNOSIS — G4709 Other insomnia: Secondary | ICD-10-CM

## 2017-02-20 DIAGNOSIS — E7800 Pure hypercholesterolemia, unspecified: Secondary | ICD-10-CM

## 2017-02-20 LAB — CBC
HEMATOCRIT: 39 % (ref 36.0–46.0)
HEMOGLOBIN: 13.7 g/dL (ref 12.0–15.0)
MCHC: 35.2 g/dL (ref 30.0–36.0)
MCV: 92.9 fl (ref 78.0–100.0)
Platelets: 209 10*3/uL (ref 150.0–400.0)
RBC: 4.2 Mil/uL (ref 3.87–5.11)
RDW: 12.9 % (ref 11.5–15.5)
WBC: 6.2 10*3/uL (ref 4.0–10.5)

## 2017-02-20 LAB — COMPREHENSIVE METABOLIC PANEL
ALK PHOS: 66 U/L (ref 39–117)
ALT: 17 U/L (ref 0–35)
AST: 18 U/L (ref 0–37)
Albumin: 4.1 g/dL (ref 3.5–5.2)
BUN: 17 mg/dL (ref 6–23)
CO2: 32 mEq/L (ref 19–32)
Calcium: 10.2 mg/dL (ref 8.4–10.5)
Chloride: 105 mEq/L (ref 96–112)
Creatinine, Ser: 0.94 mg/dL (ref 0.40–1.20)
GFR: 74.92 mL/min (ref >60.00)
GLUCOSE: 108 mg/dL — AB (ref 70–99)
POTASSIUM: 3.9 meq/L (ref 3.5–5.1)
Sodium: 140 mEq/L (ref 135–145)
TOTAL PROTEIN: 7 g/dL (ref 6.0–8.3)
Total Bilirubin: 0.9 mg/dL (ref 0.2–1.2)

## 2017-02-20 LAB — LIPID PANEL
CHOLESTEROL: 176 mg/dL (ref 0–200)
HDL: 52.3 mg/dL (ref >39.00)
LDL Cholesterol: 109 mg/dL — ABNORMAL HIGH (ref 0–99)
NONHDL: 123.44
Total CHOL/HDL Ratio: 3
Triglycerides: 74 mg/dL (ref 0.0–149.0)
VLDL: 14.8 mg/dL (ref 0.0–40.0)

## 2017-02-20 LAB — HEMOGLOBIN A1C: Hgb A1c MFr Bld: 5.1 % (ref 4.6–6.5)

## 2017-02-20 MED ORDER — AZELASTINE HCL 0.1 % NA SOLN: 1.0000 | Freq: Two times a day (BID) | NASAL | 3 refills | Status: DC

## 2017-02-20 NOTE — Assessment & Plan Note (Signed)
She is not interested in medication management She will continue CPAP for now

## 2017-02-20 NOTE — Assessment & Plan Note (Signed)
CMET and Lipid profile today Encouraged her to consume a low fat diet 

## 2017-02-20 NOTE — Progress Notes (Signed)
HPI:  Pt presents to the clinic today for her Medicare Wellness Exam. She is also due for followup of chronic conditions.  Seasonal Allergies: Worse in the spring and fall. She is requesting a RX for Azestalin nasal spray.  Arthritis: Mainly in her hands. She takes Ibuprofen as needed with good relief.   Asthma: Moderate persistent. Her symptoms are controlled on Advair daily and Albuterol as needed.  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 She see's Dr. Rexene Edison weekly. She is in an Alzheimer's support group.   GERD: This is not currently an issue. She is not taking any medication for this.   HLD: Her last LDL was 117, 01/2016. She is not taking any cholesterol lowering medications. She does consume fried foods.   HTN: Her BP today is 128/82. She is taking Losartan, HCTZ and Atenolol as prescribed.   Insomnia: She has difficulty staying asleep. She has been diagnosed with OSA and she is now on a CPAP. She reports it has helped with the quality of sleep but not the quantity. 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   Past Medical History:  Diagnosis Date  . Allergy   . Arthritis   . Asthma   . Chicken pox   . Depression   . Diverticulitis   . GERD (gastroesophageal reflux disease)   . Hx of colonic polyps   . Hyperlipidemia   . Hypertension   . Insomnia   . 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 ADVAIR HFA 115-21 MCG/ACT inhaler INHALE 1 PUFF BY MOUTH TWICE DAILY 12 g 11  . albuterol (PROVENTIL HFA;VENTOLIN HFA) 108 (90 BASE) MCG/ACT inhaler Inhale 2 puffs into the lungs every 6 (six) hours as needed for wheezing or shortness of breath. 1 Inhaler 0  . atenolol (TENORMIN) 50 MG tablet Take 1 and 1/2 tablets daily. PLEASE SCHEDULE FOLLOW UP OFFICE VISIT FOR September 2017 (Patient taking differently: 50 mg. Take 1 and 1/2  tablets daily. PLEASE SCHEDULE FOLLOW UP OFFICE VISIT FOR September 2017) 135 tablet 0  . hydrochlorothiazide (MICROZIDE) 12.5 MG capsule Take 12.5 mg by mouth daily.    Marland Kitchen losartan (COZAAR) 50 MG tablet TAKE 1 TABLET (50 MG TOTAL) BY MOUTH 2 (TWO) TIMES DAILY. 180 tablet 3  . Lutein-Zeaxanthin-Selenium 15-4.75-100 MG-MG-MCG CAPS Take 1 capsule by mouth daily.     . Omega 3 1000 MG CAPS Take 1 capsule by mouth.     No current facility-administered medications for this visit.     Allergies  Allergen Reactions  . Avelox [Moxifloxacin Hcl In Nacl] Other (See Comments)    Chills  . Clarithromycin   . Trazodone And Nefazodone Swelling    Family History  Problem Relation Age of Onset  . Breast cancer Mother 12  . Arthritis Maternal Grandmother   . Diabetes Maternal Grandmother     Social History   Social History  . Marital status: Single    Spouse name: N/A  . Number of children: N/A  . Years of education: N/A   Occupational History  . Not on file.   Social History Main Topics  . Smoking status: Former Smoker    Packs/day: 0.50    Years: 7.00  . Smokeless tobacco: Never Used     Comment: quit over 40 years ago  . Alcohol use Yes  Comment: rare--liquor  . Drug use: No  . Sexual activity: Not Currently   Other Topics Concern  . Not on file   Social History Narrative  . No narrative on file    Hospitiliaztions: None  Health Maintenance:    Flu: 08/2016  Tetanus: 12/2011  Pneumovax: 01/2012  Prevnar: 02/2016  Zostavax: 12/2011  Mammogram: 05/2016  Pap Smear: hysterectomy  Bone Density: 11/2011  Colon Screening: 02/2013  Eye Doctor: annually  Dental Exam: biannually   Providers:   PCP: Webb Silversmith, NP-C  Cardiologist: Dr. Rockey Situ  Pulmonologist: Dr. Mortimer Fries   I have personally reviewed and have noted:  1. The patient's medical and social history 2. Their use of alcohol, tobacco or illicit drugs 3. Their current medications and supplements 4. The patient's  functional ability including ADL's, fall risks, home safety risks and hearing or visual impairment. 5. Diet and physical activities 6. Evidence for depression or mood disorder  Subjective:   Review of Systems:   Constitutional: Pt reports fatigue. Denies fever, malaise, headache or abrupt weight changes.  HEENT: Pt reports decreased hearing. Denies eye pain, eye redness, ear pain, ringing in the ears, wax buildup, runny nose, nasal congestion, bloody nose, or sore throat. Respiratory: Denies difficulty breathing, shortness of breath, cough or sputum production.   Cardiovascular: Denies chest pain, chest tightness, palpitations or swelling in the hands or feet.  Gastrointestinal: Pt reports intermittent loose stool (stress related). Denies abdominal pain, bloating, constipation, diarrhea or blood in the stool.  GU: Denies urgency, frequency, pain with urination, burning sensation, blood in urine, odor or discharge. Musculoskeletal: Pt reports intermittent joint pain in hands. Denies decrease in range of motion, difficulty with gait, muscle pain or joint swelling.  Skin: Denies redness, rashes, lesions or ulcercations.  Neurological: Denies dizziness, difficulty with memory, difficulty with speech or problems with balance and coordination.  Psych: Pt reports depression (situational). Denies anxiety, SI/HI.  No other specific complaints in a complete review of systems (except as listed in HPI above).  Objective:  PE:   BP 128/82   Pulse (!) 54   Temp 98.6 F (37 C) (Oral)   Ht 5\' 1"  (1.549 m)   Wt 193 lb 8 oz (87.8 kg)   SpO2 98%   BMI 36.56 kg/m   Wt Readings from Last 3 Encounters:  12/26/16 197 lb (89.4 kg)  10/31/16 191 lb 12 oz (87 kg)  09/26/16 190 lb 8 oz (86.4 kg)    General: Appears her stated age, obese in NAD. Skin: Warm, dry and intact.  HEENT: Head: normal shape and size;  Ears: Tm's gray and intact, normal light reflex; Throat/Mouth: Teeth present, mucosa pink  and moist,+ PND, no exudate, lesions or ulcerations noted.  Cardiovascular: Normal rate and rhythm. S1,S2 noted.  No murmur, rubs or gallops noted. No JVD or BLE edema. No carotid bruits noted. Pulmonary/Chest: Normal effort and positive vesicular breath sounds. No respiratory distress. No wheezes, rales or ronchi noted.  Abdomen: Soft and nontender. Normal bowel sounds. No distention or masses noted.  Musculoskeletal: Strength 5/5 BUE/BLE. No difficulty with gait.  Neurological: Alert and oriented. Cranial nerves II-XII grossly intact. Coordination normal.  Psychiatric: Mood and affect mildly flat. Behavior is normal. Judgment and thought content normal.    BMET    Component Value Date/Time   NA 138 02/13/2016 1523   NA 140 02/21/2015 1236   K 4.1 02/13/2016 1523   CL 106 02/13/2016 1523   CO2 29 02/13/2016 1523  GLUCOSE 102 (H) 02/13/2016 1523   BUN 15 02/13/2016 1523   BUN 14 02/21/2015 1236   CREATININE 0.88 02/13/2016 1523   CALCIUM 9.5 02/13/2016 1523   GFRNONAA 60 02/21/2015 1236   GFRAA 70 02/21/2015 1236    Lipid Panel     Component Value Date/Time   CHOL 182 02/13/2016 1523   TRIG 90.0 02/13/2016 1523   HDL 46.60 02/13/2016 1523   CHOLHDL 4 02/13/2016 1523   VLDL 18.0 02/13/2016 1523   LDLCALC 117 (H) 02/13/2016 1523    CBC    Component Value Date/Time   WBC 6.9 02/13/2016 1523   RBC 4.13 02/13/2016 1523   HGB 13.5 02/13/2016 1523   HCT 38.8 02/13/2016 1523   PLT 216.0 02/13/2016 1523   MCV 94.0 02/13/2016 1523   MCHC 34.7 02/13/2016 1523   RDW 13.1 02/13/2016 1523    Hgb A1C Lab Results  Component Value Date   HGBA1C 5.1 02/13/2016      Assessment and Plan:   Medicare Annual Wellness Visit:  Diet: She does eat meat. She consumes more veggies than fruits. She does eat fried foods. She drinks mostly hot tea. Physical activity: Sedentary Depression/mood screen: Positive, working with therapy Hearing: Intact to whispered voice but she feels  like this is decreased Visual acuity: Grossly normal, performs annual eye exam  ADLs: Capable Fall risk: None Home safety: Good Cognitive evaluation: Intact to orientation, naming, recall and repetition EOL planning: Adv directives, full code/ I agree  Preventative Medicine: All immunizations UTD. Mammogram due 05/2017. No need for cervical cancer screening. Bone density ordered. Referral to audiology placed. Colon screening UTD. Encouraged her to consume a balanced diet and exercise regimen. Advised her to see an eye doctor and dentist annually. Will check CBC, CMET, Lipid, A1C today.  Decreased Hearing:  Referral to audiology placed  Next appointment: 1 year, Medicare Wellness and Follow up   Webb Silversmith, NP

## 2017-02-20 NOTE — Patient Instructions (Signed)
Health Maintenance for Postmenopausal Women Menopause is a normal process in which your reproductive ability comes to an end. This process happens gradually over a span of months to years, usually between the ages of 33 and 38. Menopause is complete when you have missed 12 consecutive menstrual periods. It is important to talk with your health care provider about some of the most common conditions that affect postmenopausal women, such as heart disease, cancer, and bone loss (osteoporosis). Adopting a healthy lifestyle and getting preventive care can help to promote your health and wellness. Those actions can also lower your chances of developing some of these common conditions. What should I know about menopause? During menopause, you may experience a number of symptoms, such as:  Moderate-to-severe hot flashes.  Night sweats.  Decrease in sex drive.  Mood swings.  Headaches.  Tiredness.  Irritability.  Memory problems.  Insomnia. Choosing to treat or not to treat menopausal changes is an individual decision that you make with your health care provider. What should I know about hormone replacement therapy and supplements? Hormone therapy products are effective for treating symptoms that are associated with menopause, such as hot flashes and night sweats. Hormone replacement carries certain risks, especially as you become older. If you are thinking about using estrogen or estrogen with progestin treatments, discuss the benefits and risks with your health care provider. What should I know about heart disease and stroke? Heart disease, heart attack, and stroke become more likely as you age. This may be due, in part, to the hormonal changes that your body experiences during menopause. These can affect how your body processes dietary fats, triglycerides, and cholesterol. Heart attack and stroke are both medical emergencies. There are many things that you can do to help prevent heart disease  and stroke:  Have your blood pressure checked at least every 1-2 years. High blood pressure causes heart disease and increases the risk of stroke.  If you are 48-61 years old, ask your health care provider if you should take aspirin to prevent a heart attack or a stroke.  Do not use any tobacco products, including cigarettes, chewing tobacco, or electronic cigarettes. If you need help quitting, ask your health care provider.  It is important to eat a healthy diet and maintain a healthy weight.  Be sure to include plenty of vegetables, fruits, low-fat dairy products, and lean protein.  Avoid eating foods that are high in solid fats, added sugars, or salt (sodium).  Get regular exercise. This is one of the most important things that you can do for your health.  Try to exercise for at least 150 minutes each week. The type of exercise that you do should increase your heart rate and make you sweat. This is known as moderate-intensity exercise.  Try to do strengthening exercises at least twice each week. Do these in addition to the moderate-intensity exercise.  Know your numbers.Ask your health care provider to check your cholesterol and your blood glucose. Continue to have your blood tested as directed by your health care provider. What should I know about cancer screening? There are several types of cancer. Take the following steps to reduce your risk and to catch any cancer development as early as possible. Breast Cancer  Practice breast self-awareness.  This means understanding how your breasts normally appear and feel.  It also means doing regular breast self-exams. Let your health care provider know about any changes, no matter how small.  If you are 40 or older,  have a clinician do a breast exam (clinical breast exam or CBE) every year. Depending on your age, family history, and medical history, it may be recommended that you also have a yearly breast X-ray (mammogram).  If you  have a family history of breast cancer, talk with your health care provider about genetic screening.  If you are at high risk for breast cancer, talk with your health care provider about having an MRI and a mammogram every year.  Breast cancer (BRCA) gene test is recommended for women who have family members with BRCA-related cancers. Results of the assessment will determine the need for genetic counseling and BRCA1 and for BRCA2 testing. BRCA-related cancers include these types:  Breast. This occurs in males or females.  Ovarian.  Tubal. This may also be called fallopian tube cancer.  Cancer of the abdominal or pelvic lining (peritoneal cancer).  Prostate.  Pancreatic. Cervical, Uterine, and Ovarian Cancer  Your health care provider may recommend that you be screened regularly for cancer of the pelvic organs. These include your ovaries, uterus, and vagina. This screening involves a pelvic exam, which includes checking for microscopic changes to the surface of your cervix (Pap test).  For women ages 21-65, health care providers may recommend a pelvic exam and a Pap test every three years. For women ages 23-65, they may recommend the Pap test and pelvic exam, combined with testing for human papilloma virus (HPV), every five years. Some types of HPV increase your risk of cervical cancer. Testing for HPV may also be done on women of any age who have unclear Pap test results.  Other health care providers may not recommend any screening for nonpregnant women who are considered low risk for pelvic cancer and have no symptoms. Ask your health care provider if a screening pelvic exam is right for you.  If you have had past treatment for cervical cancer or a condition that could lead to cancer, you need Pap tests and screening for cancer for at least 20 years after your treatment. If Pap tests have been discontinued for you, your risk factors (such as having a new sexual partner) need to be reassessed  to determine if you should start having screenings again. Some women have medical problems that increase the chance of getting cervical cancer. In these cases, your health care provider may recommend that you have screening and Pap tests more often.  If you have a family history of uterine cancer or ovarian cancer, talk with your health care provider about genetic screening.  If you have vaginal bleeding after reaching menopause, tell your health care provider.  There are currently no reliable tests available to screen for ovarian cancer. Lung Cancer  Lung cancer screening is recommended for adults 99-83 years old who are at high risk for lung cancer because of a history of smoking. A yearly low-dose CT scan of the lungs is recommended if you:  Currently smoke.  Have a history of at least 30 pack-years of smoking and you currently smoke or have quit within the past 15 years. A pack-year is smoking an average of one pack of cigarettes per day for one year. Yearly screening should:  Continue until it has been 15 years since you quit.  Stop if you develop a health problem that would prevent you from having lung cancer treatment. Colorectal Cancer  This type of cancer can be detected and can often be prevented.  Routine colorectal cancer screening usually begins at age 72 and continues  through age 75.  If you have risk factors for colon cancer, your health care provider may recommend that you be screened at an earlier age.  If you have a family history of colorectal cancer, talk with your health care provider about genetic screening.  Your health care provider may also recommend using home test kits to check for hidden blood in your stool.  A small camera at the end of a tube can be used to examine your colon directly (sigmoidoscopy or colonoscopy). This is done to check for the earliest forms of colorectal cancer.  Direct examination of the colon should be repeated every 5-10 years until  age 75. However, if early forms of precancerous polyps or small growths are found or if you have a family history or genetic risk for colorectal cancer, you may need to be screened more often. Skin Cancer  Check your skin from head to toe regularly.  Monitor any moles. Be sure to tell your health care provider:  About any new moles or changes in moles, especially if there is a change in a mole's shape or color.  If you have a mole that is larger than the size of a pencil eraser.  If any of your family members has a history of skin cancer, especially at a young age, talk with your health care provider about genetic screening.  Always use sunscreen. Apply sunscreen liberally and repeatedly throughout the day.  Whenever you are outside, protect yourself by wearing long sleeves, pants, a wide-brimmed hat, and sunglasses. What should I know about osteoporosis? Osteoporosis is a condition in which bone destruction happens more quickly than new bone creation. After menopause, you may be at an increased risk for osteoporosis. To help prevent osteoporosis or the bone fractures that can happen because of osteoporosis, the following is recommended:  If you are 19-50 years old, get at least 1,000 mg of calcium and at least 600 mg of vitamin D per day.  If you are older than age 50 but younger than age 70, get at least 1,200 mg of calcium and at least 600 mg of vitamin D per day.  If you are older than age 70, get at least 1,200 mg of calcium and at least 800 mg of vitamin D per day. Smoking and excessive alcohol intake increase the risk of osteoporosis. Eat foods that are rich in calcium and vitamin D, and do weight-bearing exercises several times each week as directed by your health care provider. What should I know about how menopause affects my mental health? Depression may occur at any age, but it is more common as you become older. Common symptoms of depression include:  Low or sad  mood.  Changes in sleep patterns.  Changes in appetite or eating patterns.  Feeling an overall lack of motivation or enjoyment of activities that you previously enjoyed.  Frequent crying spells. Talk with your health care provider if you think that you are experiencing depression. What should I know about immunizations? It is important that you get and maintain your immunizations. These include:  Tetanus, diphtheria, and pertussis (Tdap) booster vaccine.  Influenza every year before the flu season begins.  Pneumonia vaccine.  Shingles vaccine. Your health care provider may also recommend other immunizations. This information is not intended to replace advice given to you by your health care provider. Make sure you discuss any questions you have with your health care provider. Document Released: 01/24/2006 Document Revised: 06/21/2016 Document Reviewed: 09/05/2015 Elsevier Interactive Patient   Education  2017 [place] for Gastroesophageal Reflux Disease, Adult When you have gastroesophageal reflux disease (GERD), the foods you eat and your eating habits are very important. Choosing the right foods can help ease your discomfort. What guidelines do I need to follow?  Choose fruits, vegetables, whole grains, and low-fat dairy products.  Choose low-fat meat, fish, and poultry.  Limit fats such as oils, salad dressings, butter, nuts, and avocado.  Keep a food diary. This helps you identify foods that cause symptoms.  Avoid foods that cause symptoms. These may be different for everyone.  Eat small meals often instead of 3 large meals a day.  Eat your meals slowly, in a place where you are relaxed.  Limit fried foods.  Cook foods using methods other than frying.  Avoid drinking alcohol.  Avoid drinking large amounts of liquids with your meals.  Avoid bending over or lying down until 2-3 hours after eating. What foods are not recommended? These are  some foods and drinks that may make your symptoms worse: Vegetables  Tomatoes. Tomato juice. Tomato and spaghetti sauce. Chili peppers. Onion and garlic. Horseradish. Fruits  Oranges, grapefruit, and lemon (fruit and juice). Meats  High-fat meats, fish, and poultry. This includes hot dogs, ribs, ham, sausage, salami, and bacon. Dairy  Whole milk and chocolate milk. Sour cream. Cream. Butter. Ice cream. Cream cheese. Drinks  Coffee and tea. Bubbly (carbonated) drinks or energy drinks. Condiments  Hot sauce. Barbecue sauce. Sweets/Desserts  Chocolate and cocoa. Donuts. Peppermint and spearmint. Fats and Oils  High-fat foods. This includes [place] fries and potato chips. Other  Vinegar. Strong spices. This includes black pepper, white pepper, red pepper, cayenne, curry powder, cloves, ginger, and chili powder. The items listed above may not be a complete list of foods and drinks to avoid. Contact your dietitian for more information.  This information is not intended to replace advice given to you by your health care provider. Make sure you discuss any questions you have with your health care provider. Document Released: 06/02/2012 Document Revised: 05/09/2016 Document Reviewed: 10/06/2013 Elsevier Interactive Patient Education  2017 [employer].

## 2017-02-20 NOTE — Assessment & Plan Note (Signed)
Controlled on Advair Rare Albuterol use She will continue to follow with Dr. Mortimer Fries

## 2017-02-20 NOTE — Assessment & Plan Note (Signed)
eRx for Astelin nasal spray sent to pharmacy

## 2017-02-20 NOTE — Assessment & Plan Note (Signed)
She will continue CPAP Discussed how weight loss may help her symptoms  She will continue to follow with Dr. Mortimer Fries

## 2017-02-20 NOTE — Assessment & Plan Note (Signed)
Controlled on Losartan, HCTZ and Atenolol CMET today She will continue to follow with Dr. Rockey Situ

## 2017-02-20 NOTE — Assessment & Plan Note (Signed)
Currently not an issue Will monitor 

## 2017-02-20 NOTE — Assessment & Plan Note (Signed)
Situational She is not interested in medication management She will continue to follow with Dr. Rexene Edison Support offered today

## 2017-02-22 DIAGNOSIS — G4733 Obstructive sleep apnea (adult) (pediatric): Secondary | ICD-10-CM | POA: Diagnosis not present

## 2017-02-27 ENCOUNTER — Other Ambulatory Visit: Payer: Self-pay | Admitting: Internal Medicine

## 2017-02-27 DIAGNOSIS — Z1231 Encounter for screening mammogram for malignant neoplasm of breast: Secondary | ICD-10-CM

## 2017-03-07 ENCOUNTER — Encounter: Payer: Self-pay | Admitting: Internal Medicine

## 2017-03-07 ENCOUNTER — Ambulatory Visit (INDEPENDENT_AMBULATORY_CARE_PROVIDER_SITE_OTHER): Payer: Medicare HMO | Admitting: Internal Medicine

## 2017-03-07 VITALS — BP 128/72 | HR 63 | Ht 61.0 in | Wt 197.4 lb

## 2017-03-07 DIAGNOSIS — J4520 Mild intermittent asthma, uncomplicated: Secondary | ICD-10-CM | POA: Diagnosis not present

## 2017-03-07 NOTE — Patient Instructions (Signed)
Continue inhalers as prescribed Continue CPAP therapy

## 2017-03-07 NOTE — Progress Notes (Signed)
  [facility] Consultation    Date: 03/07/2017  MRN# [id number] ABCDE ONEIL 1941/06/29  Referring Physician: NP Mel Almond PMD - NP Mikenzie Mccannon is a 76 y.o. old female seen in consultation for sleep apnea evaluation  CC:  Chief Complaint  Patient presents with  . Advice Only    former VM pt. pt states she wears cpap avg 4hr nightly, feels nasal pillows and pressure are working well. DME:[facility]     HPI:  Follow up sleep assessment and ASTHMA OSA therapy working well 100% compliant, no leaks and AHI 1.1 On CPAP 10 cm h20 Patient tolerating nasal pillows and doing well, is well rested She has dx of asthma Used ventolin twice on last 6 months Uses advair and is well controlled at this time No signs of infection at this time    Allergies:  Avelox [moxifloxacin hcl in nacl]; Clarithromycin; and Trazodone and nefazodone  Review of Systems  Constitutional: Negative for chills, diaphoresis, fever and malaise/fatigue.  HENT: Negative for hearing loss.   Eyes: Negative for blurred vision and double vision.  Respiratory: Negative for cough, hemoptysis, sputum production, shortness of breath and wheezing.   Cardiovascular: Negative for chest pain.  Gastrointestinal: Negative for heartburn, nausea and vomiting.  Genitourinary: Negative for dysuria.  Musculoskeletal: Negative for myalgias.  Skin: Negative for rash.  Neurological: Negative for dizziness.  Endo/Heme/Allergies: Negative for environmental allergies. Does not bruise/bleed easily.  All other systems reviewed and are negative.    Physical Examination:   VS: BP 128/72 (BP Location: Left Arm, Cuff Size: Normal)   Pulse 63   Ht 5\' 1"  (1.549 m)   Wt 197 lb 6.4 oz (89.5 kg)   SpO2 97%   BMI 37.30 kg/m   General Appearance: No distress  Neuro:without focal findings, mental status, speech normal, alert and oriented, cranial nerves 2-12 intact, reflexes normal and symmetric, sensation grossly normal   HEENT: PERRLA, EOM intact, no ptosis, no other lesions noticed; Mallampati 3 Pulmonary: normal breath sounds., diaphragmatic excursion normal.No wheezing, No rales;   Sputum Production:  none CardiovascularNormal S1,S2.  No m/r/g.  Abdominal aorta pulsation normal.    Extremities: normal, no cyanosis, clubbing, no edema, warm with normal capillary refill. Other findings:none     Assessment and Plan:76 year old female with OSA and ASTHMA OSA is under control with CPAP therapy and doing well. Her ASTHM is very mild Intermittent and under control with current inhaler therapy  1.OSA continue CPAP therapy as prescribed 2.continue advair therapy as prescribed -albuterol as needed  Follow up in 6 months  Patient satisfied with Plan of action and management. All questions answered  Corrin Parker, M.D.  [facility]  Medical Director [place] Director [facility] Cardio-[facility] Department

## 2017-03-20 ENCOUNTER — Ambulatory Visit (INDEPENDENT_AMBULATORY_CARE_PROVIDER_SITE_OTHER): Payer: Medicare HMO | Admitting: Psychology

## 2017-03-20 DIAGNOSIS — F4323 Adjustment disorder with mixed anxiety and depressed mood: Secondary | ICD-10-CM | POA: Diagnosis not present

## 2017-03-25 DIAGNOSIS — G4733 Obstructive sleep apnea (adult) (pediatric): Secondary | ICD-10-CM | POA: Diagnosis not present

## 2017-03-31 ENCOUNTER — Other Ambulatory Visit: Payer: Self-pay | Admitting: Internal Medicine

## 2017-04-03 ENCOUNTER — Ambulatory Visit: Payer: Medicare HMO | Admitting: Psychology

## 2017-04-04 ENCOUNTER — Encounter: Payer: Self-pay | Admitting: Internal Medicine

## 2017-04-04 ENCOUNTER — Ambulatory Visit (INDEPENDENT_AMBULATORY_CARE_PROVIDER_SITE_OTHER): Payer: Medicare HMO | Admitting: Internal Medicine

## 2017-04-04 VITALS — BP 140/78 | HR 60 | Temp 98.2°F | Wt 196.1 lb

## 2017-04-04 DIAGNOSIS — J301 Allergic rhinitis due to pollen: Secondary | ICD-10-CM

## 2017-04-04 DIAGNOSIS — J4541 Moderate persistent asthma with (acute) exacerbation: Secondary | ICD-10-CM

## 2017-04-04 MED ORDER — PREDNISONE 10 MG PO TABS: ORAL_TABLET | ORAL | 0 refills | Status: DC

## 2017-04-04 MED ORDER — HYDROCODONE-HOMATROPINE 5-1.5 MG/5ML PO SYRP: 5.0000 mL | ORAL_SOLUTION | Freq: Three times a day (TID) | ORAL | 0 refills | Status: DC | PRN

## 2017-04-04 NOTE — Progress Notes (Signed)
HPI  Pt presents to the clinic today with c/o headache, runny nose, cough and chest tightness. This started 5 days ago. She is blowing clear/yellow mucous out of her nose. The cough is non productive. She denies fever but has had chills and body aches. 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 She has a history of allergies and asthma. She has not had sick contacts that she is aware of. Her immunizations are UTD.  Review of Systems        Past Medical History:  Diagnosis Date  . Allergy   . Arthritis   . Asthma   . Chicken pox   . Depression   . Diverticulitis   . GERD (gastroesophageal reflux disease)   . Hx of colonic polyps   . Hyperlipidemia   . Hypertension   . Insomnia   . 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 Breast cancer Mother 56  . Arthritis Maternal Grandmother   . Diabetes Maternal Grandmother     Social History   Social History  . Marital status: Single    Spouse name: N/A  . Number of children: N/A  . Years of education: N/A   Occupational History  . Not on file.   Social History Main Topics  . Smoking status: Former Smoker    Packs/day: 0.50    Years: 7.00  . Smokeless tobacco: Never Used     Comment: quit over 40 years ago  . Alcohol use Yes     Comment: rare--liquor  . Drug use: No  . Sexual activity: Not Currently   Other Topics Concern  . Not on file   Social History Narrative  . No narrative on file    Allergies  Allergen Reactions  . Avelox [Moxifloxacin Hcl In Nacl] Other (See Comments)    Chills  . Clarithromycin   . Trazodone And Nefazodone Swelling     Constitutional: Positive headache, fatigue and fever. Denies fatigue, fever or abrupt weight changes.  HEENT:  Positive runny nose. Denies eye redness, eye pain, pressure behind the eyes, facial pain, nasal congestion, ear pain, ringing in the ears, wax buildup, or sore throat. Respiratory: Positive cough. Denies difficulty  breathing or shortness of breath.  Cardiovascular: Denies chest pain, chest tightness, palpitations or swelling in the hands or feet.   No other specific complaints in a complete review of systems (except as listed in HPI above).  Objective:  BP 140/78   Pulse 60   Temp 98.2 F (36.8 C)   Wt 196 lb 1.9 oz (89 kg)   SpO2 96%   BMI 37.06 kg/m   Wt Readings from Last 3 Encounters:  03/07/17 197 lb 6.4 oz (89.5 kg)  02/20/17 193 lb 8 oz (87.8 kg)  12/26/16 197 lb (89.4 kg)     General: Appears her stated age, ill appearing, in NAD. HEENT: Head: normal shape and size, no sinus tenderness noted; Ears: Tm's red but intact, normal light reflex; Nose: mucosa pink and moist, septum midline; Throat/Mouth: + PND. Teeth present, mucosa pink and moist, no exudate noted, no lesions or ulcerations noted.  Neck: No cervical lymphadenopathy.  Cardiovascular: Normal rate and rhythm.  Pulmonary/Chest: Normal effort with bilateral inspiratory and expiratory wheezing noted. No respiratory distress. No rales or ronchi noted.       Assessment & Plan:   Allergic Rhinitis with Asthma Exacerbation:  Get some rest and drink plenty of water Continue  Advair and use Albuterol inhaler as needed Hold Nasocort for now eRx for Pred Taper x 6 days RX for Hycodan for cough  RTC as needed or if symptoms persist.   Webb Silversmith, NP

## 2017-04-04 NOTE — Patient Instructions (Signed)
Bronchospasm, Adult Bronchospasm is when airways in the lungs get smaller. When this happens, it can be hard to breathe. You may cough. You may also make a whistling sound when you breathe (wheeze). Follow these instructions at home: Medicines  Take over-the-counter and prescription medicines only as told by your doctor.  If you need to use an inhaler or nebulizer to take your medicine, ask your doctor how to use it.  If you were given a spacer, always use it with your inhaler. Lifestyle  Change your heating and air conditioning filter. Do this at least once a month.  Try not to use fireplaces and wood stoves.  Do not  smoke. Do not  allow smoking in your home.  Try not to use things that have a strong smell, like perfume.  Get rid of pests (such as roaches and mice) and their poop.  Remove any mold from your home.  Keep your house clean. Get rid of dust.  Use cleaning products that have no smell.  Replace carpet with wood, tile, or vinyl flooring.  Use allergy-proof pillows, mattress covers, and box spring covers.  Wash bed sheets and blankets every week. Use hot water. Dry them in a dryer.  Use blankets that are made of polyester or cotton.  Wash your hands often.  Keep pets out of your bedroom.  When you exercise, try not to breathe in cold air. General instructions  Have a plan for getting medical care. Know these things: ? When to call your doctor. ? When to call local emergency services (911 in the [place] ? Where to go in an emergency.  Stay up to date on your shots (immunizations).  When you have an episode: ? Stay calm. ? Relax. ? Breathe slowly. Contact a doctor if:  Your muscles ache.  Your chest hurts.  The color of the mucus you cough up (sputum) changes from clear or white to yellow, green, gray, or bloody.  The mucus you cough up gets thicker.  You have a fever. Get help right away if:  The whistling sound gets worse, even after you  take your medicines.  Your coughing gets worse.  You find it even harder to breathe.  Your chest hurts very much. Summary  Bronchospasm is when airways in the lungs get smaller.  When this happens, it can be hard to breathe. You may cough. You may also make a whistling sound when you breathe.  Stay away from things that cause you to have episodes. These include smoke or dust. This information is not intended to replace advice given to you by your health care provider. Make sure you discuss any questions you have with your health care provider. Document Released: 09/29/2009 Document Revised: 12/05/2016 Document Reviewed: 12/05/2016 Elsevier Interactive Patient Education  2017 [employer]  

## 2017-04-04 NOTE — Addendum Note (Signed)
Addended by: Jearld Fenton on: 04/04/2017 01:21 PM   Modules accepted: Orders

## 2017-04-11 ENCOUNTER — Emergency Department
Admission: EM | Admit: 2017-04-11 | Discharge: 2017-04-11 | Disposition: A | Discharge status: Home / Self Care | Payer: Medicare HMO | Attending: Emergency Medicine | Admitting: Emergency Medicine

## 2017-04-11 ENCOUNTER — Encounter: Payer: Self-pay | Admitting: Emergency Medicine

## 2017-04-11 ENCOUNTER — Emergency Department: Payer: Medicare HMO

## 2017-04-11 ENCOUNTER — Telehealth: Payer: Self-pay | Admitting: Internal Medicine

## 2017-04-11 DIAGNOSIS — Z79899 Other long term (current) drug therapy: Secondary | ICD-10-CM | POA: Insufficient documentation

## 2017-04-11 DIAGNOSIS — Z87891 Personal history of nicotine dependence: Secondary | ICD-10-CM | POA: Insufficient documentation

## 2017-04-11 DIAGNOSIS — J40 Bronchitis, not specified as acute or chronic: Secondary | ICD-10-CM

## 2017-04-11 DIAGNOSIS — R0602 Shortness of breath: Secondary | ICD-10-CM | POA: Diagnosis not present

## 2017-04-11 DIAGNOSIS — I10 Essential (primary) hypertension: Secondary | ICD-10-CM | POA: Insufficient documentation

## 2017-04-11 LAB — CBC
HEMATOCRIT: 38.3 % (ref 35.0–47.0)
HEMOGLOBIN: 13.7 g/dL (ref 12.0–16.0)
MCH: 33 pg (ref 26.0–34.0)
MCHC: 35.8 g/dL (ref 32.0–36.0)
MCV: 92.3 fL (ref 80.0–100.0)
Platelets: 212 10*3/uL (ref 150–440)
RBC: 4.15 MIL/uL (ref 3.80–5.20)
RDW: 13 % (ref 11.5–14.5)
WBC: 10.1 10*3/uL (ref 3.6–11.0)

## 2017-04-11 LAB — BASIC METABOLIC PANEL
Anion gap: 8 (ref 5–15)
BUN: 19 mg/dL (ref 6–20)
CALCIUM: 9.3 mg/dL (ref 8.9–10.3)
CO2: 28 mmol/L (ref 22–32)
CREATININE: 0.94 mg/dL (ref 0.44–1.00)
Chloride: 105 mmol/L (ref 101–111)
GFR calc Af Amer: >60 mL/min (ref >60)
GFR, EST NON AFRICAN AMERICAN: 59 mL/min — AB (ref >60)
GLUCOSE: 89 mg/dL (ref 65–99)
Potassium: 3.7 mmol/L (ref 3.5–5.1)
Sodium: 141 mmol/L (ref 135–145)

## 2017-04-11 LAB — TROPONIN I

## 2017-04-11 LAB — BRAIN NATRIURETIC PEPTIDE: B Natriuretic Peptide: 19 pg/mL (ref 0.0–100.0)

## 2017-04-11 MED ORDER — HYDROCOD POLST-CPM POLST ER 10-8 MG/5ML PO SUER: 5.0000 mL | Freq: Two times a day (BID) | ORAL | 0 refills | Status: DC

## 2017-04-11 MED ORDER — ALBUTEROL SULFATE HFA 108 (90 BASE) MCG/ACT IN AERS: 2.0000 | INHALATION_SPRAY | Freq: Four times a day (QID) | RESPIRATORY_TRACT | 2 refills | Status: DC | PRN

## 2017-04-11 MED ORDER — AZITHROMYCIN 250 MG PO TABS: ORAL_TABLET | ORAL | 0 refills | Status: DC

## 2017-04-11 NOTE — ED Provider Notes (Signed)
[facility] Emergency Department Provider Note       Time seen: ----------------------------------------- 2:54 PM on 04/11/2017 -----------------------------------------     I have reviewed the triage vital signs and the nursing notes.   HISTORY   Chief Complaint Shortness of Breath    HPI Molly Faulkner is a 76 y.o. female who presents to the ED for complaints of cold and congestion with worsening shortness of breath over the last 2 weeks. Patient reports finishing) of steroids earlier this week and using her rescue inhaler without any relief. She reports productive cough with a very sticky type of sputum that she has a hard time getting up.    Past Medical History:  Diagnosis Date  . Allergy   . Arthritis   . Asthma   . Chicken pox   . Depression   . Diverticulitis   . GERD (gastroesophageal reflux disease)   . Hx of colonic polyps   . Hyperlipidemia   . Hypertension   . Insomnia   . 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 Chronic seasonal allergic rhinitis due to pollen 02/20/2017  . 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 Hyperlipidemia 02/21/2015  . Insomnia 07/25/2014  . HTN (hypertension) 07/25/2014  . GERD 07/25/2014  . Depression 07/25/2014  . Asthma, moderate persistent, well-controlled 07/25/2014    Past Surgical History:  Procedure Laterality Date  . ABDOMINAL HYSTERECTOMY  1994   total  . BREAST BIOPSY Bilateral [id number]  . CARPAL TUNNEL RELEASE Bilateral     Allergies Avelox [moxifloxacin hcl in nacl]; Clarithromycin; and Trazodone and nefazodone  Social History Social History  Substance Use Topics  . Smoking status: Former Smoker    Packs/day: 0.50    Years: 7.00  . Smokeless tobacco: Never Used     Comment: quit over 40 years ago  . Alcohol use Yes     Comment: rare--liquor    Review of Systems Constitutional: Negative for fever. Eyes: Negative for vision changes ENT:   Negative for congestion, sore throat Cardiovascular: Negative for chest pain. Respiratory: Positive shortness of breath and cough Gastrointestinal: Negative for abdominal pain, vomiting and diarrhea. Genitourinary: Negative for dysuria. Musculoskeletal: Negative for back pain. Skin: Negative for rash. Neurological: Negative for headaches, focal weakness or numbness.  All systems negative/normal/unremarkable except as stated in the HPI  ____________________________________________   PHYSICAL EXAM:  VITAL SIGNS: ED Triage Vitals [04/11/17 1313]  Enc Vitals Group     BP (!) 165/81     Pulse Rate 60     Resp 20     Temp 98.8 F (37.1 C)     Temp Source Oral     SpO2 98 %     Weight 196 lb (88.9 kg)     Height 5\' 1"  (1.549 m)     Head Circumference      Peak Flow      Pain Score      Pain Loc      Pain Edu?      Excl. in [place]?    Constitutional: Alert and oriented. Well appearing and in no distress. Eyes: Conjunctivae are normal. PERRL. Normal extraocular movements. ENT   Head: Normocephalic and atraumatic.   Nose: No congestion/rhinnorhea.   Mouth/Throat: Mucous membranes are moist.   Neck: No stridor. Cardiovascular: Normal rate, regular rhythm. No murmurs, rubs, or gallops. Respiratory: Normal respiratory effort without tachypnea nor retractions. Breath sounds are clear and equal bilaterally.  No wheezes/rales/rhonchi. Gastrointestinal: Soft and nontender. Normal bowel sounds Musculoskeletal: Nontender with normal range of motion in extremities. No lower extremity tenderness nor edema. Neurologic:  Normal speech and language. No gross focal neurologic deficits are appreciated.  Skin:  Skin is warm, dry and intact. No rash noted. Psychiatric: Mood and affect are normal. Speech and behavior are normal.  ____________________________________________  EKG: Interpreted by me.Sinus bradycardia with a rate of 51 bpm, borderline long PR interval, normal QRS, normal  QT, septal infarct age indeterminate  ____________________________________________  ED COURSE:  Pertinent labs & imaging results that were available during my care of the patient were reviewed by me and considered in my medical decision making (see chart for details). Patient presents for shortness of breath and cough, we will assess with labs and imaging as indicated.   Procedures ____________________________________________   LABS (pertinent positives/negatives)  Labs Reviewed  BASIC METABOLIC PANEL - Abnormal; Notable for the following:       Result Value   GFR calc non Af Amer 59 (*)    All other components within normal limits  TROPONIN I  CBC  BRAIN NATRIURETIC PEPTIDE    RADIOLOGY Chest x-ray  Is within normal limits  ____________________________________________  FINAL ASSESSMENT AND PLAN  Shortness of breath and cough, bronchitis  Plan: Patient's labs and imaging were dictated above. Patient had presented for cough and cold with congestion and shortness of breath for 2 weeks. She is finished steroids and doesn't feel any better. Her lungs were now are clear. I will place her on antibiotics with cough medication and refill her prescription for her rescue inhaler. She is stable for outpatient follow-up.   Earleen Newport, MD   Note: This note was generated in part or whole with voice recognition software. Voice recognition is usually quite accurate but there are transcription errors that can and very often do occur. I apologize for any typographical errors that were not detected and corrected.     Earleen Newport, MD 04/11/17 [phone]

## 2017-04-11 NOTE — Telephone Encounter (Signed)
Patient Name: Molly Faulkner  DOB: 1941/04/05    Initial Comment Caller says she is having shortness of breath and she is asthmatic, she has been sick for over a week she has already finished prednisone she was prescribed and now she is sick again.   Nurse Assessment  Nurse: Ardine Bjork, RN, Melissa Date/Time (Eastern Time): 04/11/2017 12:37:25 PM  Confirm and document reason for call. If symptomatic, describe symptoms. ---Caller says she is having shortness of breath and she is asthmatic, she has been sick for over a week (11 days)she has already finished prednisone(finished prednisone last Tue) she was prescribed and now she is sick again. Denies fever.  Does the patient have any new or worsening symptoms? ---Yes  Will a triage be completed? ---Yes  Related visit to physician within the last 2 weeks? ---Yes  Does the PT have any chronic conditions? (i.e. diabetes, asthma, etc.) ---Yes  List chronic conditions. ---Asthma, HTN.  Is this a behavioral health or substance abuse call? ---No     Guidelines    Guideline Title Affirmed Question Affirmed Notes  Asthma Attack Chest pain    Final Disposition User   Go to ED Now Zayas, RN, [place] [facility]  [place] [facility]  [facility] - ED   Disagree/Comply: Comply

## 2017-04-11 NOTE — Telephone Encounter (Signed)
Pt in ED.  

## 2017-04-11 NOTE — ED Triage Notes (Signed)
Pt in via POV with complaints of recent cold/congestion with worsening shortness of breath over the last two week.  Pt has finished round of steroids and been using rescue inhaler without any relief.  Pt reports clear productive cough.  Vitals WDL, NAD noted at this time.

## 2017-04-17 ENCOUNTER — Ambulatory Visit: Payer: Medicare HMO | Admitting: Psychology

## 2017-04-21 ENCOUNTER — Ambulatory Visit
Admission: RE | Admit: 2017-04-21 | Discharge: 2017-04-21 | Disposition: A | Discharge status: Home / Self Care | Payer: Medicare HMO | Source: Ambulatory Visit | Attending: Internal Medicine | Admitting: Internal Medicine

## 2017-04-21 DIAGNOSIS — Z87891 Personal history of nicotine dependence: Secondary | ICD-10-CM | POA: Insufficient documentation

## 2017-04-21 DIAGNOSIS — J45909 Unspecified asthma, uncomplicated: Secondary | ICD-10-CM | POA: Diagnosis not present

## 2017-04-21 DIAGNOSIS — Z1231 Encounter for screening mammogram for malignant neoplasm of breast: Secondary | ICD-10-CM | POA: Insufficient documentation

## 2017-04-21 DIAGNOSIS — E2839 Other primary ovarian failure: Secondary | ICD-10-CM | POA: Diagnosis not present

## 2017-04-21 DIAGNOSIS — Z13820 Encounter for screening for osteoporosis: Secondary | ICD-10-CM | POA: Diagnosis not present

## 2017-04-24 DIAGNOSIS — G4733 Obstructive sleep apnea (adult) (pediatric): Secondary | ICD-10-CM | POA: Diagnosis not present

## 2017-04-30 ENCOUNTER — Telehealth: Payer: Self-pay | Admitting: Internal Medicine

## 2017-04-30 NOTE — Telephone Encounter (Signed)
Molly Faulkner  Pt would like you to call her she has a question about the bill for office visit dated 04/04/17 statement date 04/14/17.  She stated she has spoke with you about her previous bills and coding issues. Please call pt

## 2017-05-01 NOTE — Telephone Encounter (Signed)
Molly Faulkner,  I have reviewed [name]' chart for DOS 04/04/2017 and requested the billing provider be corrected and the claim re-filed.  Please notify patient.  Thank you!

## 2017-05-06 ENCOUNTER — Ambulatory Visit (INDEPENDENT_AMBULATORY_CARE_PROVIDER_SITE_OTHER): Payer: Medicare HMO | Admitting: Psychology

## 2017-05-06 DIAGNOSIS — F4324 Adjustment disorder with disturbance of conduct: Secondary | ICD-10-CM

## 2017-05-13 ENCOUNTER — Encounter: Payer: Self-pay | Admitting: Cardiovascular Disease

## 2017-05-13 NOTE — Telephone Encounter (Signed)
This encounter was created in error - please disregard.

## 2017-05-24 ENCOUNTER — Other Ambulatory Visit: Payer: Self-pay | Admitting: Internal Medicine

## 2017-05-25 DIAGNOSIS — G4733 Obstructive sleep apnea (adult) (pediatric): Secondary | ICD-10-CM | POA: Diagnosis not present

## 2017-06-10 ENCOUNTER — Ambulatory Visit (INDEPENDENT_AMBULATORY_CARE_PROVIDER_SITE_OTHER): Payer: Medicare HMO | Admitting: Psychology

## 2017-06-10 DIAGNOSIS — F4323 Adjustment disorder with mixed anxiety and depressed mood: Secondary | ICD-10-CM

## 2017-06-19 NOTE — Progress Notes (Signed)
Maybe I will go to gym Cardiology Office Note  Date:  06/20/2017   ID:  Molly Faulkner, DOB 01/28/1941, MRN [id number]  PCP:  Molly Fenton, NP   Chief Complaint  Patient presents with  . OTHER    Discuss enlarged heart. Meds reviewed verbally with pt.    HPI:  Molly Faulkner is a very pleasant 76 year old woman with history of  hypertension,  syncope,  tachycardia  OSA on CPAP Cardiac workup in 2011 at [place], echo and stress test who presents for follow-up of her hypertension and tachycardia  CXR 03/2017, she had the flu Cardiomegaly. No definite active infiltrates or failure. Bibasilar scarring versus subsegmental atelectasis.  She is concerned about finding of cardiomegaly She is otherwise active, denies any significant shortness of breath or chest discomfort Does Water aorebics and yoga class without symptoms  Denies any further episodes of near syncope or syncope  sister with dementia passed away recently, she was primary caretaker   She takes losartan 50 g twice a day, atenolol 75 mg in the morning, HCTZ 12.5 mill grams daily   she declined EKG on today's visit   Other past medical history  February 2016 she was on a plane going to [place]. She had not drink much fluids, had a breakfast bar and a bottle of water. She had a queasy feeling in her stomach while on the plane, she got up the window seat, moved into the asle, when she developed lightheadedness, dizziness and acute syncope. She feels that she was only out for several seconds before she came to on the ground. She sat there for several minutes until she felt better, sat and had some juice for several minutes, felt better and then went to the bathroom in the back of the plane. Rest of the trip was uneventful.  10 days later approximately she woke up with tachycardia, February 09, 2015, measured her heart rate at 110 bpm, blood pressure also elevated. She felt general malaise, felt weird She rested and eventually  after over one hour, heart rate came down, blood pressure came down. She went to see primary care and on arrival to their office, she reports that her palpitations resolved. EKG at that time showed heart rate in the 60s.   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  PSH:    Past Surgical History:  Procedure Laterality Date  . ABDOMINAL HYSTERECTOMY  1994   total  . BREAST BIOPSY Bilateral [id number]  . CARPAL TUNNEL RELEASE Bilateral     Current Outpatient Prescriptions  Medication Sig Dispense Refill  . ADVAIR HFA 115-21 MCG/ACT inhaler INHALE 1 PUFF BY MOUTH TWICE DAILY 12 g 0  . albuterol (PROVENTIL HFA;VENTOLIN HFA) 108 (90 Base) MCG/ACT inhaler Inhale 2 puffs into the lungs every 6 (six) hours as needed for wheezing or shortness of breath. 1 Inhaler 2  . atenolol (TENORMIN) 50 MG tablet Take 1.5 tablets (75 mg total) by mouth daily. Take 1 and 1/2 tablets daily. 135 tablet 2  . azelastine (ASTELIN) 0.1 % nasal spray Place 1 spray into both nostrils 2 (two) times daily. Use in each nostril as directed 90 mL 3  . hydrochlorothiazide (MICROZIDE) 12.5 MG capsule Take 12.5 mg by mouth daily.    Marland Kitchen HYDROcodone-homatropine (HYCODAN) 5-1.5 MG/5ML syrup Take 5 mLs by mouth every 8 (eight) hours as needed. 75  mL 0  . losartan (COZAAR) 50 MG tablet TAKE 1 TABLET (50 MG TOTAL) BY MOUTH 2 (TWO) TIMES DAILY. 180 tablet 3  . Lutein-Zeaxanthin-Selenium 15-4.75-100 MG-MG-MCG CAPS Take 1 capsule by mouth daily.     . Omega 3 1000 MG CAPS Take 1 capsule by mouth.     No current facility-administered medications for this visit.      Allergies:   Avelox [moxifloxacin hcl in nacl]; Clarithromycin; and Trazodone and nefazodone   Social History:  The patient  reports that she has quit smoking. She has a 3.50 pack-year  smoking history. She has never used smokeless tobacco. She reports that she drinks alcohol. She reports that she does not use drugs.   Family History:   family history includes Arthritis in her maternal grandmother; Breast cancer (age of onset: 72) in her mother; Diabetes in her maternal grandmother.    Review of Systems: Review of Systems  Constitutional: Negative.   Respiratory: Negative.   Cardiovascular: Negative.   Gastrointestinal: Negative.   Musculoskeletal: Negative.   Neurological: Negative.   Psychiatric/Behavioral: Negative.   All other systems reviewed and are negative.    PHYSICAL EXAM: VS:  BP 120/77 (BP Location: Left Arm, Patient Position: Sitting, Cuff Size: Large)   Pulse (!) 58   Ht 5\' 1"  (1.549 m)   Wt 194 lb 8 oz (88.2 kg)   BMI 36.75 kg/m  , BMI Body mass index is 36.75 kg/m. GEN: Well nourished, well developed, in no acute distress, obese  HEENT: normal  Neck: no JVD, carotid bruits, or masses Cardiac: RRR; no murmurs, rubs, or gallops,no edema  Respiratory:  clear to auscultation bilaterally, normal work of breathing GI: soft, nontender, nondistended, + BS MS: no deformity or atrophy  Skin: warm and dry, no rash Neuro:  Strength and sensation are intact Psych: euthymic mood, full affect    Recent Labs: 02/20/2017: ALT 17 04/11/2017: B Natriuretic Peptide 19.0; BUN 19; Creatinine, Ser 0.94; Hemoglobin 13.7; Platelets 212; Potassium 3.7; Sodium 141    Lipid Panel Lab Results  Component Value Date   CHOL 176 02/20/2017   HDL 52.30 02/20/2017   LDLCALC 109 (H) 02/20/2017   TRIG 74.0 02/20/2017      Wt Readings from Last 3 Encounters:  06/20/17 194 lb 8 oz (88.2 kg)  04/11/17 196 lb (88.9 kg)  04/04/17 196 lb 1.9 oz (89 kg)       ASSESSMENT AND PLAN:   Essential hypertension - Plan: EKG 12-Lead Blood pressure is well controlled on today's visit. No changes made to the medications.  Pure hypercholesterolemia Currently not on any  cholesterol medication. Recommended weight loss, exercise  Cardiomegaly X-ray pulled up in the office today and discussed with her in detail We have recommended echocardiogram given this finding Order placed  OSA  On CPAP Discussed implications of sleep apnea and cardiomegaly in detail   Total encounter time more than 25 minutes  Greater than 50% was spent in counseling and coordination of care with the patient   Disposition:   F/U  12 months as needed    No orders of the defined types were placed in this encounter.    Signed, Esmond Plants, M.D., Ph.D. 06/20/2017  [place], [place]

## 2017-06-20 ENCOUNTER — Encounter: Payer: Self-pay | Admitting: Cardiovascular Disease

## 2017-06-20 ENCOUNTER — Ambulatory Visit (INDEPENDENT_AMBULATORY_CARE_PROVIDER_SITE_OTHER): Payer: Medicare HMO | Admitting: Cardiovascular Disease

## 2017-06-20 VITALS — BP 120/77 | HR 58 | Ht 61.0 in | Wt 194.5 lb

## 2017-06-20 DIAGNOSIS — I517 Cardiomegaly: Secondary | ICD-10-CM | POA: Diagnosis not present

## 2017-06-20 DIAGNOSIS — G4733 Obstructive sleep apnea (adult) (pediatric): Secondary | ICD-10-CM | POA: Diagnosis not present

## 2017-06-20 DIAGNOSIS — I10 Essential (primary) hypertension: Secondary | ICD-10-CM

## 2017-06-20 DIAGNOSIS — R55 Syncope and collapse: Secondary | ICD-10-CM

## 2017-06-20 DIAGNOSIS — Z9989 Dependence on other enabling machines and devices: Secondary | ICD-10-CM | POA: Diagnosis not present

## 2017-06-20 DIAGNOSIS — I479 Paroxysmal tachycardia, unspecified: Secondary | ICD-10-CM

## 2017-06-20 DIAGNOSIS — M7989 Other specified soft tissue disorders: Secondary | ICD-10-CM | POA: Diagnosis not present

## 2017-06-20 DIAGNOSIS — E782 Mixed hyperlipidemia: Secondary | ICD-10-CM

## 2017-06-20 NOTE — Patient Instructions (Addendum)
Medication Instructions:   No medication changes made  Meclizine 25 mg as needed for spinning  Labwork:  No new labs needed  Testing/Procedures:  We will order an echocardiogram for cardiomegaly, leg swelling Echocardiography is a painless test that uses sound waves to create images of your heart. It provides your doctor with information about the size and shape of your heart and how well your heart's chambers and valves are working. This procedure takes approximately one hour. There are no restrictions for this procedure.   Follow-Up: It was a pleasure seeing you in the office today. Please call us if you have new issues that need to be addressed before your next appt.  [phone]  Your physician wants you to follow-up in: 12 months.  You will receive a reminder letter in the mail two months in advance. If you don't receive a letter, please call our office to schedule the follow-up appointment.  If you need a refill on your cardiac medications before your next appointment, please call your pharmacy.    Echocardiogram An echocardiogram, or echocardiography, uses sound waves (ultrasound) to produce an image of your heart. The echocardiogram is simple, painless, obtained within a short period of time, and offers valuable information to your health care provider. The images from an echocardiogram can provide information such as:  Evidence of coronary artery disease (CAD).  Heart size.  Heart muscle function.  Heart valve function.  Aneurysm detection.  Evidence of a past heart attack.  Fluid buildup around the heart.  Heart muscle thickening.  Assess heart valve function.  Tell a health care provider about:  Any allergies you have.  All medicines you are taking, including vitamins, herbs, eye drops, creams, and over-the-counter medicines.  Any problems you or family members have had with anesthetic medicines.  Any blood disorders you have.  Any surgeries you  have had.  Any medical conditions you have.  Whether you are pregnant or may be pregnant. What happens before the procedure? No special preparation is needed. Eat and drink normally. What happens during the procedure?  In order to produce an image of your heart, gel will be applied to your chest and a wand-like tool (transducer) will be moved over your chest. The gel will help transmit the sound waves from the transducer. The sound waves will harmlessly bounce off your heart to allow the heart images to be captured in real-time motion. These images will then be recorded.  You may need an IV to receive a medicine that improves the quality of the pictures. What happens after the procedure? You may return to your normal schedule including diet, activities, and medicines, unless your health care provider tells you otherwise. This information is not intended to replace advice given to you by your health care provider. Make sure you discuss any questions you have with your health care provider. Document Released: 11/29/2000 Document Revised: 07/20/2016 Document Reviewed: 08/09/2013 Elsevier Interactive Patient Education  2017 [employer].

## 2017-06-24 ENCOUNTER — Other Ambulatory Visit: Payer: Self-pay | Admitting: Internal Medicine

## 2017-06-24 DIAGNOSIS — G4733 Obstructive sleep apnea (adult) (pediatric): Secondary | ICD-10-CM | POA: Diagnosis not present

## 2017-07-02 ENCOUNTER — Ambulatory Visit (INDEPENDENT_AMBULATORY_CARE_PROVIDER_SITE_OTHER): Payer: Medicare HMO | Admitting: Family Medicine

## 2017-07-02 ENCOUNTER — Encounter: Payer: Self-pay | Admitting: Family Medicine

## 2017-07-02 ENCOUNTER — Ambulatory Visit (INDEPENDENT_AMBULATORY_CARE_PROVIDER_SITE_OTHER)
Admission: RE | Admit: 2017-07-02 | Discharge: 2017-07-02 | Disposition: A | Discharge status: Home / Self Care | Payer: Medicare HMO | Source: Ambulatory Visit | Attending: Family Medicine | Admitting: Family Medicine

## 2017-07-02 VITALS — BP 110/70 | HR 55 | Temp 98.7°F | Ht 61.0 in | Wt 197.5 lb

## 2017-07-02 DIAGNOSIS — M25561 Pain in right knee: Secondary | ICD-10-CM

## 2017-07-02 DIAGNOSIS — M1711 Unilateral primary osteoarthritis, right knee: Secondary | ICD-10-CM

## 2017-07-02 DIAGNOSIS — G4733 Obstructive sleep apnea (adult) (pediatric): Secondary | ICD-10-CM | POA: Diagnosis not present

## 2017-07-02 MED ORDER — METHYLPREDNISOLONE ACETATE 40 MG/ML IJ SUSP
80.0000 mg | Freq: Once | INTRAMUSCULAR | Status: AC
  Administered 2017-07-02: 80 mg via INTRA_ARTICULAR

## 2017-07-02 NOTE — Progress Notes (Signed)
Dr. Frederico Hamman T. Kaysan Peixoto, MD, [facility] [place], [address] Phone: [phone] Fax: [phone]  07/02/2017  Patient: Molly Faulkner, MRN: [id number], DOB: June 28, 1941, 76 y.o.  Primary Physician:  Jearld Fenton, NP   Chief Complaint  Patient presents with  . Knee Pain    Right   Subjective:   Molly Faulkner is a 76 y.o. very pleasant female patient who presents with the following:  R knee, started in April. Has been trying to be more active. Seated yoga and in the pool,  She is having pain more medially, and to a lesser extent laterally.  Occasionally she is having pain anteriorly.  She has not had any significant swelling.  She has not had any mechanical symptoms or buckling.  History is significant for a prior arthroscopy on the left.  She has no history of significant injury to the right knee lifetime.  inj r knee  Past Medical History, Surgical History, Social History, Family History, Problem List, Medications, and Allergies have been reviewed and updated if relevant.  Patient Active Problem List   Diagnosis Date Noted  . Chronic seasonal allergic rhinitis due to pollen 02/20/2017  . OSA on CPAP 07/01/2016  . Hyperlipidemia 02/21/2015  . Insomnia 07/25/2014  . HTN (hypertension) 07/25/2014  . GERD 07/25/2014  . Depression 07/25/2014  . Asthma, moderate persistent, well-controlled 07/25/2014    Past Medical History:  Diagnosis Date  . Allergy   . Arthritis   . Asthma   . Chicken pox   . Depression   . Diverticulitis   . GERD (gastroesophageal reflux disease)   . Hx of colonic polyps   . Hyperlipidemia   . Hypertension   . Insomnia   . 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 ABDOMINAL HYSTERECTOMY  1994   total  . BREAST BIOPSY Bilateral [id number]  . CARPAL TUNNEL RELEASE Bilateral     Social History   Social History  . Marital status: Single   Spouse name: N/A  . Number of children: N/A  . Years of education: N/A   Occupational History  . Not on file.   Social History Main Topics  . Smoking status: Former Smoker    Packs/day: 0.50    Years: 7.00  . Smokeless tobacco: Never Used     Comment: quit over 40 years ago  . Alcohol use Yes     Comment: rare--liquor  . Drug use: No  . Sexual activity: Not Currently   Other Topics Concern  . Not on file   Social History Narrative  . No narrative on file    Family History  Problem Relation Age of Onset  . Breast cancer Mother 70  . Arthritis Maternal Grandmother   . Diabetes Maternal Grandmother     Allergies  Allergen Reactions  . Avelox [Moxifloxacin Hcl In Nacl] Other (See Comments)    Chills  . Clarithromycin   . Trazodone And Nefazodone Swelling    Medication list reviewed and updated in full in [place].  GEN: No fevers, chills. Nontoxic. Primarily MSK c/o today. MSK: Detailed in the HPI GI: tolerating PO intake without difficulty Neuro: No numbness, parasthesias, or tingling associated. Otherwise the pertinent positives of the ROS are noted above.   Objective:   BP 110/70   Pulse (!) 55   Temp 98.7 F (37.1 C) (Oral)   Ht 5'  1" (1.549 m)   Wt 197 lb 8 oz (89.6 kg)   BMI 37.32 kg/m    GEN: WDWN, NAD, Non-toxic, Alert & Oriented x 3 HEENT: Atraumatic, Normocephalic.  Ears and Nose: No external deformity. EXTR: No clubbing/cyanosis/edema NEURO: Normal gait.  PSYCH: Normally interactive. Conversant. Not depressed or anxious appearing.  Calm demeanor.   Knee:  R Gait: Normal heel toe pattern ROM: 0-120 Effusion: neg Echymosis or edema: none Patellar tendon NT Painful PLICA: neg Patellar grind: negative Medial and lateral patellar facet loading: negative medial and lateral joint lines: medial > lateral joint line pain Mcmurray's + Flexion-pinch + Varus and valgus stress: stable Lachman: neg Ant and Post drawer: neg Hip  abduction, IR, ER: WNL Hip flexion str: 5/5 Hip abd: 5/5 Quad: 5/5 VMO atrophy:No Hamstring concentric and eccentric: 5/5   Radiology: No results found.   Assessment and Plan:   Acute pain of right knee - Plan: DG Knee 4 Views W/Patella Right, methylPREDNISolone acetate (DEPO-MEDROL) injection 80 mg  Primary osteoarthritis of right knee  Tricompartmental arthritis on x-ray with probable degenerative meniscal tear medially.  At 76, she is going to do some water aerobics 3 times a week and some chair yoga.  I think this is a great idea to try to rehabilitate her knee.  We will also do an intra-articular injection for some acute pain relief  Knee Injection, R Patient verbally consented to procedure. Risks (including potential rare risk of infection), benefits, and alternatives explained. Sterilely prepped with Chloraprep. Ethyl cholride used for anesthesia. 8 cc Lidocaine 1% mixed with 2 mL Depo-Medrol 40 mg injected using the anteromedial approach without difficulty. No complications with procedure and tolerated well. Patient had decreased pain post-injection.   Follow-up: No Follow-up on file.  Future Appointments Date Time Provider Reader  07/15/2017 3:00 PM [facility] [place] [id number]    Meds ordered this encounter  Medications  . methylPREDNISolone acetate (DEPO-MEDROL) injection 80 mg   Medications Discontinued During This Encounter  Medication Reason  . HYDROcodone-homatropine (HYCODAN) 5-1.5 MG/5ML syrup Completed Course  . azelastine (ASTELIN) 0.1 % nasal spray Completed Course   Orders Placed This Encounter  Procedures  . DG Knee 4 Views W/Patella Right    Signed,  Frederico Hamman T. Marko Skalski, MD   Allergies as of 07/02/2017      Reactions   Avelox [moxifloxacin Hcl In Nacl] Other (See Comments)   Chills   Clarithromycin    Trazodone And Nefazodone Swelling      Medication List       Accurate as of 07/02/17  6:01 PM. Always use your most  recent med list.          ADVAIR HFA 115-21 MCG/ACT inhaler Generic drug:  fluticasone-salmeterol INHALE 1 PUFF BY MOUTH TWICE DAILY   albuterol 108 (90 Base) MCG/ACT inhaler Commonly known as:  PROVENTIL HFA;VENTOLIN HFA Inhale 2 puffs into the lungs every 6 (six) hours as needed for wheezing or shortness of breath.   atenolol 50 MG tablet Commonly known as:  TENORMIN Take 1.5 tablets (75 mg total) by mouth daily. Take 1 and 1/2 tablets daily.   hydrochlorothiazide 12.5 MG capsule Commonly known as:  MICROZIDE Take 12.5 mg by mouth daily.   losartan 50 MG tablet Commonly known as:  COZAAR TAKE 1 TABLET (50 MG TOTAL) BY MOUTH 2 (TWO) TIMES DAILY.   Lutein-Zeaxanthin-Selenium 15-4.75-100 MG-MG-MCG Caps Take 1 capsule by mouth daily.   Omega 3 1000 MG Caps Take 1 capsule  by mouth.

## 2017-07-15 ENCOUNTER — Ambulatory Visit (INDEPENDENT_AMBULATORY_CARE_PROVIDER_SITE_OTHER): Payer: Medicare HMO

## 2017-07-15 ENCOUNTER — Telehealth: Payer: Self-pay | Admitting: Cardiovascular Disease

## 2017-07-15 ENCOUNTER — Other Ambulatory Visit: Payer: Self-pay

## 2017-07-15 DIAGNOSIS — M7989 Other specified soft tissue disorders: Secondary | ICD-10-CM | POA: Diagnosis not present

## 2017-07-15 DIAGNOSIS — I517 Cardiomegaly: Secondary | ICD-10-CM | POA: Diagnosis not present

## 2017-07-15 NOTE — Telephone Encounter (Signed)
Patient dropped off copy of nuclear Report from 2011    Placed in nurse box.

## 2017-07-15 NOTE — Telephone Encounter (Signed)
Can you scan that in and route to Dr. Rockey Situ, please? Thank you!

## 2017-07-17 NOTE — Telephone Encounter (Signed)
Error duplicate encounter

## 2017-07-25 DIAGNOSIS — G4733 Obstructive sleep apnea (adult) (pediatric): Secondary | ICD-10-CM | POA: Diagnosis not present

## 2017-08-25 DIAGNOSIS — G4733 Obstructive sleep apnea (adult) (pediatric): Secondary | ICD-10-CM | POA: Diagnosis not present

## 2017-08-29 ENCOUNTER — Other Ambulatory Visit: Payer: Self-pay | Admitting: Cardiovascular Disease

## 2017-09-02 ENCOUNTER — Encounter: Payer: Self-pay | Admitting: Internal Medicine

## 2017-09-02 ENCOUNTER — Ambulatory Visit (INDEPENDENT_AMBULATORY_CARE_PROVIDER_SITE_OTHER): Payer: Medicare HMO | Admitting: Internal Medicine

## 2017-09-02 ENCOUNTER — Ambulatory Visit: Payer: Self-pay | Admitting: Internal Medicine

## 2017-09-02 VITALS — BP 150/80 | HR 113 | Ht 61.0 in | Wt 195.0 lb

## 2017-09-02 DIAGNOSIS — J4520 Mild intermittent asthma, uncomplicated: Secondary | ICD-10-CM | POA: Diagnosis not present

## 2017-09-02 DIAGNOSIS — G4733 Obstructive sleep apnea (adult) (pediatric): Secondary | ICD-10-CM

## 2017-09-02 NOTE — Patient Instructions (Signed)
Continue inhalers as prescribed Continue CPAP therapy as prescribed  

## 2017-09-02 NOTE — Progress Notes (Signed)
  [facility] Consultation    Date: 09/02/2017  MRN# [id number] SIRA ADSIT [birth year]  Referring Physician: NP Mel Almond PMD - NP Ludene Stokke is a 76 y.o. old female seen in consultation for sleep apnea evaluation  CC:  Follow up ASTHMA Follow up OSA   HPI:  Follow up sleep assessment and ASTHMA OSA therapy working well 100% compliant, no leaks and AHI 1.1 On CPAP 10 cm h20 Patient tolerating nasal pillows and doing well, is well rested She has dx of asthma Used ventolin twice on last 6 months Uses advair and is well controlled at this time No signs of infection at this time   Allergies:  Avelox [moxifloxacin hcl in nacl]; Clarithromycin; and Trazodone and nefazodone  Review of Systems  Constitutional: Negative for chills, diaphoresis, fever and malaise/fatigue.  HENT: Negative for hearing loss.   Eyes: Negative for blurred vision and double vision.  Respiratory: Negative for cough, hemoptysis, sputum production, shortness of breath and wheezing.   Cardiovascular: Negative for chest pain.  Gastrointestinal: Negative for heartburn, nausea and vomiting.  Genitourinary: Negative for dysuria.  Musculoskeletal: Negative for myalgias.  Skin: Negative for rash.  Neurological: Negative for dizziness.  Endo/Heme/Allergies: Negative for environmental allergies. Does not bruise/bleed easily.  All other systems reviewed and are negative.    Physical Examination:   VS: BP (!) 150/80 (BP Location: Left Arm, Cuff Size: Normal)   Pulse (!) 113   Ht 5\' 1"  (1.549 m)   Wt 195 lb (88.5 kg)   SpO2 92%   BMI 36.84 kg/m   General Appearance: No distress  Neuro:without focal findings, mental status, speech normal, alert and oriented, cranial nerves 2-12 intact, reflexes normal and symmetric, sensation grossly normal  HEENT: PERRLA, EOM intact, no ptosis, no other lesions noticed; Mallampati 3 Pulmonary: normal breath sounds., diaphragmatic excursion  normal.No wheezing, No rales;   Sputum Production:  none CardiovascularNormal S1,S2.  No m/r/g.  Abdominal aorta pulsation normal.    Extremities: normal, no cyanosis, clubbing, no edema, warm with normal capillary refill. Other findings:none   Assessment and Plan: 76 year old female with OSA and ASTHMA OSA is under control with CPAP therapy and doing well. Her ASTHM is very mild Intermittent and under control with current inhaler therapy  1.OSA continue CPAP therapy as prescribed current AHI is 1.1 on CPAP therapy   2.continue advair therapy as prescribed -albuterol as needed  Follow up in 6 months   Patient satisfied with Plan of action and management. All questions answered  Corrin Parker, M.D.  [facility]  Medical Director [place] Director [facility] Cardio-Pulmonary Department

## 2017-09-04 ENCOUNTER — Ambulatory Visit: Payer: Self-pay

## 2017-09-22 ENCOUNTER — Ambulatory Visit (INDEPENDENT_AMBULATORY_CARE_PROVIDER_SITE_OTHER)
Admission: RE | Admit: 2017-09-22 | Discharge: 2017-09-22 | Disposition: A | Discharge status: Home / Self Care | Payer: Medicare HMO | Source: Ambulatory Visit | Attending: Family Medicine | Admitting: Family Medicine

## 2017-09-22 ENCOUNTER — Encounter: Payer: Self-pay | Admitting: Family Medicine

## 2017-09-22 ENCOUNTER — Ambulatory Visit (INDEPENDENT_AMBULATORY_CARE_PROVIDER_SITE_OTHER): Payer: Medicare HMO | Admitting: Family Medicine

## 2017-09-22 VITALS — BP 118/70 | HR 63 | Temp 98.6°F | Ht 61.0 in | Wt 195.5 lb

## 2017-09-22 DIAGNOSIS — M1711 Unilateral primary osteoarthritis, right knee: Secondary | ICD-10-CM | POA: Diagnosis not present

## 2017-09-22 DIAGNOSIS — M1612 Unilateral primary osteoarthritis, left hip: Secondary | ICD-10-CM | POA: Diagnosis not present

## 2017-09-22 DIAGNOSIS — M545 Low back pain, unspecified: Secondary | ICD-10-CM

## 2017-09-22 DIAGNOSIS — M25552 Pain in left hip: Secondary | ICD-10-CM

## 2017-09-22 DIAGNOSIS — G8929 Other chronic pain: Secondary | ICD-10-CM

## 2017-09-22 DIAGNOSIS — Z23 Encounter for immunization: Secondary | ICD-10-CM | POA: Diagnosis not present

## 2017-09-22 DIAGNOSIS — M48061 Spinal stenosis, lumbar region without neurogenic claudication: Secondary | ICD-10-CM | POA: Diagnosis not present

## 2017-09-22 MED ORDER — HYLAN G-F 20 48 MG/6ML IX SOSY
48.0000 mg | PREFILLED_SYRINGE | Freq: Once | INTRA_ARTICULAR | Status: AC
  Administered 2017-09-22: 48 mg via INTRA_ARTICULAR

## 2017-09-22 NOTE — Progress Notes (Signed)
Dr. Frederico Hamman T. Rhyann Berton, MD, [facility] [place], [address] Phone: [phone] Fax: [phone]  09/22/2017  Patient: Molly Faulkner, MRN: [id number], DOB: 06/26/1941, 76 y.o.  Primary Physician:  Jearld Fenton, NP   Chief Complaint  Patient presents with  . Knee Pain    Right  . Hip Pain    Bilateral  . Back Pain    Lower   Subjective:   Molly Faulkner is a 76 y.o. very pleasant female patient who presents with the following:  R knee - 75% of the time it hurts, doesn't have to do anything. It essentially just hurts, it hurts even when she is sitting. I evaluated her for this before and we did do a knee injection several months ago which provided her only about 2 weeks of relief of symptoms.  L groin pain and LBP. Buttocks pain. Her worst pain is in the left groin region, and she also is having pain in the buttocks as well as the low back. She tells me that she has had low back pain off and on for years.  Water aerobics and seated yoga   She has known right-sided moderate osteoarthritis of the right knee.  She only had 2 or 3 weeks if improvement of symptoms when we did a corticosteroid injection, she is interested in other options, and she wants to be active.  R synvisc-one injection  Past Medical History, Surgical History, Social History, Family History, Problem List, Medications, and Allergies have been reviewed and updated if relevant.  Patient Active Problem List   Diagnosis Date Noted  . Chronic seasonal allergic rhinitis due to pollen 02/20/2017  . OSA on CPAP 07/01/2016  . Hyperlipidemia 02/21/2015  . Insomnia 07/25/2014  . HTN (hypertension) 07/25/2014  . GERD 07/25/2014  . Depression 07/25/2014  . Asthma, moderate persistent, well-controlled 07/25/2014    Past Medical History:  Diagnosis Date  . Allergy   . Arthritis   . Asthma   . Chicken pox   . Depression   . Diverticulitis   . GERD  (gastroesophageal reflux disease)   . Hx of colonic polyps   . Hyperlipidemia   . Hypertension   . Insomnia   . 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 ABDOMINAL HYSTERECTOMY  1994   total  . BREAST BIOPSY Bilateral [id number]  . CARPAL TUNNEL RELEASE Bilateral     Social History   Social History  . Marital status: Single    Spouse name: N/A  . Number of children: N/A  . Years of education: N/A   Occupational History  . Not on file.   Social History Main Topics  . Smoking status: Former Smoker    Packs/day: 0.50    Years: 7.00  . Smokeless tobacco: Never Used     Comment: quit over 40 years ago  . Alcohol use Yes     Comment: rare--liquor  . Drug use: No  . Sexual activity: Not Currently   Other Topics Concern  . Not on file   Social History Narrative  . No narrative on file    Family History  Problem Relation Age of Onset  . Breast cancer Mother 26  . Arthritis Maternal Grandmother   . Diabetes Maternal Grandmother     Allergies  Allergen Reactions  . Avelox [Moxifloxacin Hcl In Nacl] Other (See Comments)    Chills  .  Clarithromycin   . Trazodone And Nefazodone Swelling    Medication list reviewed and updated in full in [facility].  GEN: No fevers, chills. Nontoxic. Primarily MSK c/o today. MSK: Detailed in the HPI GI: tolerating PO intake without difficulty Neuro: No numbness, parasthesias, or tingling associated. Otherwise the pertinent positives of the ROS are noted above.   Objective:   BP 118/70   Pulse 63   Temp 98.6 F (37 C) (Oral)   Ht 5' 1" (1.549 m)   Wt 195 lb 8 oz (88.7 kg)   BMI 36.94 kg/m    GEN: WDWN, NAD, Non-toxic, Alert & Oriented x 3 HEENT: Atraumatic, Normocephalic.  Ears and Nose: No external deformity. EXTR: No clubbing/cyanosis/edema NEURO: Normal gait.  PSYCH: Normally interactive. Conversant. Not depressed or anxious appearing.  Calm demeanor.    HIP EXAM: SIDE:  L ROM: Abduction, Flexion, Internal and External range of motion: Approximately 15% loss of motion on the left side. Pain with terminal IROM and EROM: Pain with terminal internal range of motion. GTB: ttp SLR: NEG Knees: No effusion FABER: NT REVERSE FABER: NT, neg Piriformis: NT at direct palpation Str: flexion: 5/5 abduction: 5/5 adduction: 5/5 Strength testing non-tender  Right knee continues to have medial and lateral joint line pain, worse on the medial aspect of the knee.  She has a lack of 2 of extension and flexion to 110.  Flexion pinch and McMurray's both caused some pain.  Rises to examination table with no difficulty Gait: non antalgic  Inspection/Deformity: N Paraspinus Tenderness: mild l2-s1 tenderness  B Ankle Dorsiflexion (L5,4): 5/5 B Great Toe Dorsiflexion (L5,4): 5/5  SENSORY B Medial Foot (L4): WNL B Dorsum (L5): WNL B Lateral (S1): WNL  B SLR, seated: neg B Log Roll: neg B Sciatic Notch: TTP  Radiology: Dg Lumbar Spine Complete  Result Date: 09/22/2017 CLINICAL DATA:  76-year-old female with left hip pain. No reported trauma. Initial encounter. EXAM: LUMBAR SPINE - COMPLETE 4+ VIEW COMPARISON:  None. FINDINGS: Transitional vertebra lumbosacral junction. Minimal curvature lumbar sine. Facet degenerative changes lower lumbar spine. Minimal anterior slip L4. Mild disc space narrowing L1-2 thru L4-5. No pars defect detected. Vascular calcifications. IMPRESSION: Transitional vertebra lumbosacral junction. Facet degenerative changes lower lumbar spine. Minimal anterior slip L4. Mild disc space narrowing L1-2 thru L4-5. Aortic Atherosclerosis (ICD10-I70.0). Electronically Signed   By: Steven  Olson M.D.   On: 09/22/2017 19:05   Dg Hip Unilat With Pelvis 2-3 Views Left  Result Date: 09/22/2017 CLINICAL DATA:  76-year-old female with left hip pain. No reported trauma. Initial encounter. EXAM: DG HIP (WITH OR WITHOUT PELVIS) 2-3V LEFT COMPARISON:  None. FINDINGS:  Mild bilateral hip joint degenerative changes. No evidence of left femoral head avascular necrosis. No fracture or dislocation. IMPRESSION: Mild bilateral hip joint degenerative changes. Electronically Signed   By: Steven  Olson M.D.   On: 09/22/2017 19:06    CLINICAL DATA:  Right knee pain  EXAM: RIGHT KNEE - COMPLETE 4+ VIEW  COMPARISON:  None.  FINDINGS: Bones:  No fracture or dislocation.  Normal bone mineralization.  Joints: Normal alignment. No erosive changes. Severe medial femorotibial compartment joint space narrowing with a near bone-on-bone appearance. Lateral femorotibial compartment joint space is maintained. Mild patellofemoral compartment joint space narrowing. Chondrocalcinosis of the lateral femorotibial compartment. No significant joint effusion. On the AP weight-bearing view the left knee is included and demonstrates mild medial and lateral femorotibial compartment joint space narrowing with small marginal osteophytes.  Soft tissue: No soft   tissue abnormality. No radiopaque foreign body. No subcutaneous emphysema.  IMPRESSION: 1. Severe right medial femorotibial compartment osteoarthritis. Mild patellofemoral compartment osteoarthritis. 2. Mild osteoarthritis of the left medial and lateral femorotibial compartments.   Electronically Signed   By: Kathreen Devoid   On: 07/03/2017 08:22   Assessment and Plan:   Primary osteoarthritis of right knee - Plan: Hylan SOSY 48 mg  Need for prophylactic vaccination and inoculation against influenza - Plan: Flu Vaccine QUAD 36+ mos IM  Left hip pain - Plan: DG HIP UNILAT WITH PELVIS 2-3 VIEWS LEFT  Chronic bilateral low back pain without sciatica - Plan: DG Lumbar Spine Complete  Ongoing moderate osteoarthritis of the right knee.  We talked about various strategies and I gave her a book on exercise from the [employer] of aging.  Also recommended water aerobics, tai chi, and yoga.  We will do an  intra-articular Synvisc 1 injection to see if this provides her extended amount relief.  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  She also has multiple areas of degenerative disc as well as osteoarthritic changes in her back.  All of these are challenging and are not uncommon in a 76 year old body.  I counseled her to lose weight, exercise following the manner that I had suggested.  She is hesitant to take NSAIDs, but I suggested that she try some Tylenol and some occasional NSAID I think is pretty reasonable in this case.  Knee Injection: Synvisc-One, R Patient verbally consented to procedure. Risks (including infection), benefits, and alternatives explained. Sterilely prepped with Chloraprep. Ethyl cholride used for anesthesia, then 7 cc of Lidocaine 1% used for anesthesia in the anterolateral position. Reprepped with Chloraprep.  Anterolateral approach used to inject joint without difficulty, injected with Synvisc-One, 6 mL. No complications with procedure and tolerated well.   Follow-up: when needed  Meds ordered this encounter  Medications  . Hylan SOSY 48 mg   There are no discontinued medications. Orders Placed This Encounter  Procedures  . DG HIP UNILAT WITH PELVIS 2-3 VIEWS LEFT  . DG Lumbar Spine Complete  . Flu Vaccine QUAD 36+ mos IM    Signed,  Itzayana Pardy T. Gissella Niblack, MD   Patient's Medications  New Prescriptions   No medications on file  Previous Medications   ADVAIR HFA 115-21 MCG/ACT INHALER    INHALE 1 PUFF BY MOUTH TWICE DAILY   ALBUTEROL (PROVENTIL HFA;VENTOLIN HFA) 108 (90 BASE) MCG/ACT INHALER    Inhale 2 puffs into the lungs every 6 (six) hours as needed for wheezing or shortness of breath.   ATENOLOL (TENORMIN) 50 MG TABLET    Take 1.5 tablets (75 mg total) by mouth daily. Take 1 and 1/2 tablets daily.   HYDROCHLOROTHIAZIDE (MICROZIDE) 12.5 MG CAPSULE    Take 12.5 mg by mouth daily.   LOSARTAN (COZAAR) 50 MG TABLET    TAKE 1  TABLET TWICE DAILY   LUTEIN-ZEAXANTHIN-SELENIUM 15-4.75-100 MG-MG-MCG CAPS    Take 1 capsule by mouth daily.    OMEGA 3 1000 MG CAPS    Take 1 capsule by mouth.  Modified Medications   No medications on file  Discontinued Medications   No medications on file

## 2017-09-24 DIAGNOSIS — G4733 Obstructive sleep apnea (adult) (pediatric): Secondary | ICD-10-CM | POA: Diagnosis not present

## 2017-10-03 ENCOUNTER — Other Ambulatory Visit: Payer: Self-pay | Admitting: Internal Medicine

## 2017-10-14 DIAGNOSIS — H2513 Age-related nuclear cataract, bilateral: Secondary | ICD-10-CM | POA: Diagnosis not present

## 2017-10-25 DIAGNOSIS — G4733 Obstructive sleep apnea (adult) (pediatric): Secondary | ICD-10-CM | POA: Diagnosis not present

## 2017-11-17 DIAGNOSIS — I700 Atherosclerosis of aorta: Secondary | ICD-10-CM | POA: Diagnosis not present

## 2017-11-17 DIAGNOSIS — J45909 Unspecified asthma, uncomplicated: Secondary | ICD-10-CM | POA: Diagnosis not present

## 2017-11-17 DIAGNOSIS — R1030 Lower abdominal pain, unspecified: Secondary | ICD-10-CM | POA: Diagnosis not present

## 2017-11-17 DIAGNOSIS — R350 Frequency of micturition: Secondary | ICD-10-CM | POA: Diagnosis not present

## 2017-11-17 DIAGNOSIS — I10 Essential (primary) hypertension: Secondary | ICD-10-CM | POA: Diagnosis not present

## 2017-11-17 DIAGNOSIS — E785 Hyperlipidemia, unspecified: Secondary | ICD-10-CM | POA: Diagnosis not present

## 2017-11-17 DIAGNOSIS — K219 Gastro-esophageal reflux disease without esophagitis: Secondary | ICD-10-CM | POA: Diagnosis not present

## 2017-11-17 DIAGNOSIS — I771 Stricture of artery: Secondary | ICD-10-CM | POA: Diagnosis not present

## 2017-11-17 DIAGNOSIS — R600 Localized edema: Secondary | ICD-10-CM | POA: Diagnosis not present

## 2017-11-26 DIAGNOSIS — H25011 Cortical age-related cataract, right eye: Secondary | ICD-10-CM | POA: Diagnosis not present

## 2017-11-27 ENCOUNTER — Encounter: Payer: Self-pay | Admitting: *Deleted

## 2017-12-01 ENCOUNTER — Ambulatory Visit: Payer: Self-pay

## 2017-12-01 NOTE — Telephone Encounter (Signed)
Patient has history of carpal tunnel, c/o numbness and tingling of left hand, no acute distress, refused appointment this week due to cataract surgery tomorrow, appointment made for 12/11/17.  Reason for Disposition . [1] Numbness or tingling in one or both hands AND [2] is a chronic symptom (recurrent or ongoing AND present > 4 weeks)  Protocols used: NEUROLOGIC DEFICIT-A-AH

## 2017-12-02 ENCOUNTER — Ambulatory Visit: Payer: Medicare HMO | Admitting: Certified Registered Nurse Anesthetist

## 2017-12-02 ENCOUNTER — Ambulatory Visit
Admission: RE | Admit: 2017-12-02 | Discharge: 2017-12-02 | Disposition: A | Discharge status: Home / Self Care | Payer: Medicare HMO | Source: Ambulatory Visit | Attending: Ophthalmology | Admitting: Ophthalmology

## 2017-12-02 ENCOUNTER — Encounter
Admission: RE | Disposition: A | Discharge status: Home / Self Care | Payer: Self-pay | Source: Ambulatory Visit | Attending: Ophthalmology

## 2017-12-02 DIAGNOSIS — G4700 Insomnia, unspecified: Secondary | ICD-10-CM | POA: Diagnosis not present

## 2017-12-02 DIAGNOSIS — H2511 Age-related nuclear cataract, right eye: Secondary | ICD-10-CM | POA: Diagnosis not present

## 2017-12-02 DIAGNOSIS — E785 Hyperlipidemia, unspecified: Secondary | ICD-10-CM | POA: Diagnosis not present

## 2017-12-02 DIAGNOSIS — G4733 Obstructive sleep apnea (adult) (pediatric): Secondary | ICD-10-CM | POA: Diagnosis not present

## 2017-12-02 DIAGNOSIS — J45909 Unspecified asthma, uncomplicated: Secondary | ICD-10-CM | POA: Diagnosis not present

## 2017-12-02 DIAGNOSIS — Z79899 Other long term (current) drug therapy: Secondary | ICD-10-CM | POA: Diagnosis not present

## 2017-12-02 DIAGNOSIS — I10 Essential (primary) hypertension: Secondary | ICD-10-CM | POA: Diagnosis not present

## 2017-12-02 DIAGNOSIS — M1990 Unspecified osteoarthritis, unspecified site: Secondary | ICD-10-CM | POA: Diagnosis not present

## 2017-12-02 DIAGNOSIS — K219 Gastro-esophageal reflux disease without esophagitis: Secondary | ICD-10-CM | POA: Diagnosis not present

## 2017-12-02 DIAGNOSIS — F329 Major depressive disorder, single episode, unspecified: Secondary | ICD-10-CM | POA: Diagnosis not present

## 2017-12-02 HISTORY — PX: CATARACT EXTRACTION W/PHACO: SHX586

## 2017-12-02 HISTORY — DX: Diverticulosis of intestine, part unspecified, without perforation or abscess without bleeding: K57.90

## 2017-12-02 HISTORY — DX: Sleep apnea, unspecified: G47.30

## 2017-12-02 SURGERY — PHACOEMULSIFICATION, CATARACT, WITH IOL INSERTION
Anesthesia: Monitor Anesthesia Care | Site: Eye | Laterality: Right | Wound class: Clean

## 2017-12-02 MED ORDER — MOXIFLOXACIN HCL 0.5 % OP SOLN
OPHTHALMIC | Status: AC
  Filled 2017-12-02: qty 3

## 2017-12-02 MED ORDER — ARMC OPHTHALMIC DILATING DROPS
1.0000 "application " | OPHTHALMIC | Status: AC
  Administered 2017-12-02: 10:00:00 via OPHTHALMIC
  Administered 2017-12-02 (×2): 1 via OPHTHALMIC

## 2017-12-02 MED ORDER — EPINEPHRINE PF 1 MG/ML IJ SOLN
INTRAMUSCULAR | Status: AC
  Filled 2017-12-02: qty 1

## 2017-12-02 MED ORDER — POVIDONE-IODINE 5 % OP SOLN
OPHTHALMIC | Status: DC | PRN
  Administered 2017-12-02: 1 via OPHTHALMIC

## 2017-12-02 MED ORDER — FENTANYL CITRATE (PF) 100 MCG/2ML IJ SOLN
INTRAMUSCULAR | Status: DC | PRN
  Administered 2017-12-02: 75 ug via INTRAVENOUS

## 2017-12-02 MED ORDER — EPINEPHRINE PF 1 MG/ML IJ SOLN
INTRAMUSCULAR | Status: DC | PRN
  Administered 2017-12-02: 1 mL via OPHTHALMIC

## 2017-12-02 MED ORDER — LIDOCAINE HCL (PF) 4 % IJ SOLN
INTRAOCULAR | Status: DC | PRN
  Administered 2017-12-02: 2 mL via OPHTHALMIC

## 2017-12-02 MED ORDER — LIDOCAINE HCL (PF) 4 % IJ SOLN
INTRAMUSCULAR | Status: AC
  Filled 2017-12-02: qty 5

## 2017-12-02 MED ORDER — MOXIFLOXACIN HCL 0.5 % OP SOLN: 1.0000 [drp] | OPHTHALMIC | Status: DC | PRN

## 2017-12-02 MED ORDER — FENTANYL CITRATE (PF) 100 MCG/2ML IJ SOLN
INTRAMUSCULAR | Status: AC
  Filled 2017-12-02: qty 2

## 2017-12-02 MED ORDER — NA CHONDROIT SULF-NA HYALURON 40-17 MG/ML IO SOLN
INTRAOCULAR | Status: DC | PRN
  Administered 2017-12-02: 1 mL via INTRAOCULAR

## 2017-12-02 MED ORDER — NA CHONDROIT SULF-NA HYALURON 40-17 MG/ML IO SOLN
INTRAOCULAR | Status: AC
  Filled 2017-12-02: qty 1

## 2017-12-02 MED ORDER — ARMC OPHTHALMIC DILATING DROPS
OPHTHALMIC | Status: AC
  Filled 2017-12-02: qty 0.4

## 2017-12-02 MED ORDER — MOXIFLOXACIN HCL 0.5 % OP SOLN
OPHTHALMIC | Status: DC | PRN
  Administered 2017-12-02: .2 mL via OPHTHALMIC

## 2017-12-02 MED ORDER — POVIDONE-IODINE 5 % OP SOLN
OPHTHALMIC | Status: AC
  Filled 2017-12-02: qty 30

## 2017-12-02 MED ORDER — CARBACHOL 0.01 % IO SOLN
INTRAOCULAR | Status: DC | PRN
  Administered 2017-12-02: .5 mL via INTRAOCULAR

## 2017-12-02 MED ORDER — SODIUM CHLORIDE 0.9 % IV SOLN
INTRAVENOUS | Status: DC
  Administered 2017-12-02: 10:00:00 via INTRAVENOUS

## 2017-12-02 SURGICAL SUPPLY — 16 items
GLOVE BIO SURGEON STRL SZ8 (GLOVE) ×2 IMPLANT
GLOVE BIOGEL M 6.5 STRL (GLOVE) ×2 IMPLANT
GLOVE SURG LX 8.0 MICRO (GLOVE) ×1
GLOVE SURG LX STRL 8.0 MICRO (GLOVE) ×1 IMPLANT
GOWN STRL REUS W/ TWL LRG LVL3 (GOWN DISPOSABLE) ×2 IMPLANT
GOWN STRL REUS W/TWL LRG LVL3 (GOWN DISPOSABLE) ×2
LABEL CATARACT MEDS ST (LABEL) ×2 IMPLANT
LENS IOL TECNIS ITEC 21.5 (Intraocular Lens) ×2 IMPLANT
PACK CATARACT (MISCELLANEOUS) ×2 IMPLANT
PACK CATARACT BRASINGTON LX (MISCELLANEOUS) ×2 IMPLANT
PACK EYE AFTER SURG (MISCELLANEOUS) ×2 IMPLANT
SOL BSS BAG (MISCELLANEOUS) ×2
SOLUTION BSS BAG (MISCELLANEOUS) ×1 IMPLANT
SYR 5ML LL (SYRINGE) ×2 IMPLANT
WATER STERILE IRR 250ML POUR (IV SOLUTION) ×2 IMPLANT
WIPE NON LINTING 3.25X3.25 (MISCELLANEOUS) ×2 IMPLANT

## 2017-12-02 NOTE — H&P (Signed)
All labs reviewed. Abnormal studies sent to patients PCP when indicated.  Previous H&P reviewed, patient examined, there are NO CHANGES.  Molly Faulkner LOUIS12/18/201810:36 AM

## 2017-12-02 NOTE — Discharge Instructions (Signed)
Eye Surgery Discharge Instructions  Expect mild scratchy sensation or mild soreness. DO NOT RUB YOUR EYE!  The day of surgery:  Minimal physical activity, but bed rest is not required  No reading, computer work, or close hand work  No bending, lifting, or straining.  May watch TV  For 24 hours:  No driving, legal decisions, or alcoholic beverages  Safety precautions  Eat anything you prefer: It is better to start with liquids, then soup then solid foods.  _____ Eye patch should be worn until postoperative exam tomorrow.  ____ Solar shield eyeglasses should be worn for comfort in the sunlight/patch while sleeping  Resume all regular medications including aspirin or Coumadin if these were discontinued prior to surgery. You may shower, bathe, shave, or wash your hair. Tylenol may be taken for mild discomfort.  Call your doctor if you experience significant pain, nausea, or vomiting, fever > 101 or other signs of infection. [phone] or [phone] Specific instructions:  Follow-up Information    Birder Robson, MD Follow up.   Specialty:  Ophthalmology Why:  December 19 at 9:15am Contact information: [address] [place] [address] [phone]

## 2017-12-02 NOTE — Anesthesia Postprocedure Evaluation (Signed)
Anesthesia Post Note  Patient: KADESHIA KASPARIAN  Procedure(s) Performed: CATARACT EXTRACTION PHACO AND INTRAOCULAR LENS PLACEMENT (IOC) (Right Eye)  Patient location during evaluation: PACU Anesthesia Type: MAC Level of consciousness: awake and alert and oriented Pain management: pain level controlled Vital Signs Assessment: post-procedure vital signs reviewed and stable Respiratory status: spontaneous breathing, nonlabored ventilation and respiratory function stable Cardiovascular status: blood pressure returned to baseline and stable Postop Assessment: no signs of nausea or vomiting Anesthetic complications: no     Last Vitals:  Vitals:   12/02/17 1108 12/02/17 1114  BP: 130/65 125/66  Pulse: (!) 50 (!) 46  Resp: 16 16  Temp: 36.7 C   SpO2: 99% 100%    Last Pain:  Vitals:   12/02/17 1108  TempSrc: Tympanic                 Gladys Deckard

## 2017-12-02 NOTE — Anesthesia Preprocedure Evaluation (Signed)
Anesthesia Evaluation  Patient identified by MRN, date of birth, ID band Patient awake    Reviewed: Allergy & Precautions, NPO status , Patient's Chart, lab work & pertinent test results  History of Anesthesia Complications Negative for: history of anesthetic complications  Airway Mallampati: II  TM Distance: >3 FB Neck ROM: Full    Dental no notable dental hx.    Pulmonary asthma , sleep apnea and Continuous Positive Airway Pressure Ventilation , former smoker,    breath sounds clear to auscultation- rhonchi (-) wheezing      Cardiovascular hypertension, Pt. on medications (-) CAD, (-) Past MI and (-) Cardiac Stents  Rhythm:Regular Rate:Normal - Systolic murmurs and - Diastolic murmurs    Neuro/Psych PSYCHIATRIC DISORDERS Depression negative neurological ROS     GI/Hepatic Neg liver ROS, GERD  ,  Endo/Other  negative endocrine ROSneg diabetes  Renal/GU negative Renal ROS     Musculoskeletal  (+) Arthritis ,   Abdominal (+) + obese,   Peds  Hematology negative hematology ROS (+)   Anesthesia Other Findings Past Medical History: No date: Allergy No date: Arthritis No date: Asthma No date: Chicken pox No date: Depression No date: Diverticulitis No date: Diverticulosis No date: GERD (gastroesophageal reflux disease) No date: Hx of colonic polyps No date: Hyperlipidemia No date: Hypertension No date: Insomnia 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 No date: Sleep apnea     Comment:  H/O   NO LONGER   Reproductive/Obstetrics                             Anesthesia Physical Anesthesia Plan  ASA: III  Anesthesia Plan: MAC   Post-op Pain Management:    Induction: Intravenous  PONV Risk Score and Plan: 2 and Midazolam  Airway Management Planned: Natural Airway  Additional Equipment:   Intra-op Plan:   Post-operative Plan:   Informed Consent: I have reviewed the patients History and  Physical, chart, labs and discussed the procedure including the risks, benefits and alternatives for the proposed anesthesia with the patient or authorized representative who has indicated his/her understanding and acceptance.     Plan Discussed with: CRNA and Anesthesiologist  Anesthesia Plan Comments:         Anesthesia Quick Evaluation

## 2017-12-02 NOTE — Anesthesia Procedure Notes (Signed)
Procedure Name: MAC Performed by: Demetrius Charity, CRNA Pre-anesthesia Checklist: Patient identified, Suction available, Emergency Drugs available, Timeout performed and Patient being monitored Oxygen Delivery Method: Nasal cannula

## 2017-12-02 NOTE — Op Note (Signed)
PREOPERATIVE DIAGNOSIS:  Nuclear sclerotic cataract of the right eye.   POSTOPERATIVE DIAGNOSIS:  nuclear sclerotic cataract right eye   OPERATIVE PROCEDURE: Procedure(s): CATARACT EXTRACTION PHACO AND INTRAOCULAR LENS PLACEMENT (IOC)   SURGEON:  Birder Robson, MD.   ANESTHESIA:  Anesthesiologist: Emmie Niemann, MD CRNA: Demetrius Charity, CRNA  1.      Managed anesthesia care. 2.      0.49ml of Shugarcaine was instilled in the eye following the paracentesis.   COMPLICATIONS:  None.   TECHNIQUE:   Stop and chop   DESCRIPTION OF PROCEDURE:  The patient was examined and consented in the preoperative holding area where the aforementioned topical anesthesia was applied to the right eye and then brought back to the Operating Room where the right eye was prepped and draped in the usual sterile ophthalmic fashion and a lid speculum was placed. A paracentesis was created with the side port blade and the anterior chamber was filled with viscoelastic. A near clear corneal incision was performed with the steel keratome. A continuous curvilinear capsulorrhexis was performed with a cystotome followed by the capsulorrhexis forceps. Hydrodissection and hydrodelineation were carried out with BSS on a blunt cannula. The lens was removed in a stop and chop  technique and the remaining cortical material was removed with the irrigation-aspiration handpiece. The capsular bag was inflated with viscoelastic and the Technis [id number]  lens was placed in the capsular bag without complication. The remaining viscoelastic was removed from the eye with the irrigation-aspiration handpiece. The wounds were hydrated. The anterior chamber was flushed with Miostat and the eye was inflated to physiologic pressure. 0.95ml of Vigamox was placed in the anterior chamber. The wounds were found to be water tight. The eye was dressed with Vigamox. The patient was given protective glasses to wear throughout the day and a shield with which  to sleep tonight. The patient was also given drops with which to begin a drop regimen today and will follow-up with me in one day. Implant Name Type Inv. Item Serial No. Manufacturer Lot No. LRB No. Used  LENS IOL DIOP 21.5 - [id number] Intraocular Lens LENS IOL DIOP 21.5 [id number] AMO  Right 1   Procedure(s) with comments: CATARACT EXTRACTION PHACO AND INTRAOCULAR LENS PLACEMENT (IOC) (Right) - [place] 00:36 AP% 11.5 CDE 4.21 Fluid pack lot # [id number]  Electronically signed: [place] 12/02/2017 11:02 AM

## 2017-12-02 NOTE — Anesthesia Post-op Follow-up Note (Signed)
Anesthesia QCDR form completed.        

## 2017-12-02 NOTE — Transfer of Care (Signed)
Immediate Anesthesia Transfer of Care Note  Patient: Molly Faulkner  Procedure(s) Performed: CATARACT EXTRACTION PHACO AND INTRAOCULAR LENS PLACEMENT (IOC) (Right Eye)  Patient Location: PACU  Anesthesia Type:MAC  Level of Consciousness: awake, alert  and oriented  Airway & Oxygen Therapy: Patient Spontanous Breathing  Post-op Assessment: Report given to RN and Post -op Vital signs reviewed and stable  Post vital signs: Reviewed and stable  Last Vitals:  Vitals:   12/02/17 0950 12/02/17 1007  BP: 126/63   Pulse: (!) 57   Resp: 18   Temp:  37 C  SpO2: 100%     Last Pain: There were no vitals filed for this visit.       Complications: No apparent anesthesia complications

## 2017-12-11 ENCOUNTER — Ambulatory Visit: Payer: Medicare HMO | Admitting: Internal Medicine

## 2017-12-11 ENCOUNTER — Encounter: Payer: Self-pay | Admitting: Internal Medicine

## 2017-12-11 VITALS — BP 126/80 | HR 61 | Temp 98.1°F | Wt 190.0 lb

## 2017-12-11 DIAGNOSIS — G5602 Carpal tunnel syndrome, left upper limb: Secondary | ICD-10-CM | POA: Diagnosis not present

## 2017-12-11 NOTE — Progress Notes (Signed)
Subjective:    Patient ID: Molly Faulkner, female    DOB: 07-22-1941, 76 y.o.   MRN: [id number]  HPI  Pt presents to the clinic today with c/o numbness and tingling in her left hand. This has been going on for about 1 month, but noticed increase in intensity of symptoms 1 week ago. She denies weakness. She denies any injury to the left wrist. She has tried stretching and Ibuprofen with minimal relief. She has a history of bilateral carpal tunnel release in the 1980's.  Review of Systems      Past Medical History:  Diagnosis Date  . Allergy   . Arthritis   . Asthma   . Chicken pox   . Depression   . Diverticulitis   . Diverticulosis   . GERD (gastroesophageal reflux disease)   . Hx of colonic polyps   . Hyperlipidemia   . Hypertension   . Insomnia   . 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 Sleep apnea    H/O   NO LONGER    Current Outpatient Medications  Medication Sig Dispense Refill  . ADVAIR HFA 115-21 MCG/ACT inhaler INHALE 1 PUFF BY MOUTH TWICE DAILY 12 g 0  . albuterol (PROVENTIL HFA;VENTOLIN HFA) 108 (90 Base) MCG/ACT inhaler Inhale 2 puffs into the lungs every 6 (six) hours as needed for wheezing or shortness of breath. 1 Inhaler 2  . atenolol (TENORMIN) 50 MG tablet Take 1.5 tablets (75 mg total) by mouth daily. Take 1 and 1/2 tablets daily. 135 tablet 2  . hydrochlorothiazide (MICROZIDE) 12.5 MG capsule Take 12.5 mg by mouth daily.    Marland Kitchen losartan (COZAAR) 50 MG tablet TAKE 1 TABLET TWICE DAILY 180 tablet 3  . Lutein-Zeaxanthin-Selenium 15-4.75-100 MG-MG-MCG CAPS Take 1 capsule by mouth daily.     . Omega 3 1000 MG CAPS Take 1 capsule by mouth.     No current facility-administered medications for this visit.     Allergies  Allergen Reactions  . Avelox [Moxifloxacin Hcl In Nacl] Other (See Comments)    Chills  . Clarithromycin   . Trazodone And Nefazodone Swelling    Family History  Problem Relation Age of Onset  . Breast cancer Mother 94  . Arthritis Maternal  Grandmother   . Diabetes Maternal Grandmother     Social History   Socioeconomic History  . Marital status: Single    Spouse name: Not on file  . Number of children: Not on file  . Years of education: Not on file  . Highest education level: Not on file  Social Needs  . Financial resource strain: Not on file  . Food insecurity - worry: Not on file  . Food insecurity - inability: Not on file  . Transportation needs - medical: Not on file  . Transportation needs - non-medical: Not on file  Occupational History  . Not on file  Tobacco Use  . Smoking status: Former Smoker    Packs/day: 0.50    Years: 7.00    Pack years: 3.50  . Smokeless tobacco: Never Used  . Tobacco comment: quit over 40 years ago  Substance and Sexual Activity  . Alcohol use: Yes    Comment: rare--liquor  . Drug use: No  . Sexual activity: Not Currently  Other Topics Concern  . Not on file  Social History Narrative  . Not on file     Constitutional: Denies fever, malaise, fatigue, headache or abrupt weight changes.  Musculoskeletal: Denies decrease in range of  motion, difficulty with gait, muscle pain or joint pain and swelling.  Skin: Denies redness, rashes, lesions or ulcercations.  Neurological: Pt reports numbness and tingling in left hand. Denies dizziness, difficulty with memory, difficulty with speech or problems with balance and coordination.   No other specific complaints in a complete review of systems (except as listed in HPI above).  Objective:   Physical Exam   BP 126/80   Pulse 61   Temp 98.1 F (36.7 C) (Oral)   Wt 190 lb (86.2 kg)   SpO2 98%   BMI 35.90 kg/m  Wt Readings from Last 3 Encounters:  12/11/17 190 lb (86.2 kg)  12/02/17 188 lb 8 oz (85.5 kg)  09/22/17 195 lb 8 oz (88.7 kg)    General: Appears her stated age, well developed, well nourished in NAD. Musculoskeletal: Normal flexion, extension and rotation of the left wrist. No swelling noted. Hand grips equal.    Neurological: Alert and oriented. Sensation intact to BUE. Positive Phalen's. Positive Tinel's.  BMET    Component Value Date/Time   NA 141 04/11/2017 1352   NA 140 02/21/2015 1236   K 3.7 04/11/2017 1352   CL 105 04/11/2017 1352   CO2 28 04/11/2017 1352   GLUCOSE 89 04/11/2017 1352   BUN 19 04/11/2017 1352   BUN 14 02/21/2015 1236   CREATININE 0.94 04/11/2017 1352   CALCIUM 9.3 04/11/2017 1352   GFRNONAA 59 (L) 04/11/2017 1352   GFRAA >60 04/11/2017 1352    Lipid Panel     Component Value Date/Time   CHOL 176 02/20/2017 1416   TRIG 74.0 02/20/2017 1416   HDL 52.30 02/20/2017 1416   CHOLHDL 3 02/20/2017 1416   VLDL 14.8 02/20/2017 1416   LDLCALC 109 (H) 02/20/2017 1416    CBC    Component Value Date/Time   WBC 10.1 04/11/2017 1352   RBC 4.15 04/11/2017 1352   HGB 13.7 04/11/2017 1352   HCT 38.3 04/11/2017 1352   PLT 212 04/11/2017 1352   MCV 92.3 04/11/2017 1352   MCH 33.0 04/11/2017 1352   MCHC 35.8 04/11/2017 1352   RDW 13.0 04/11/2017 1352    Hgb A1C Lab Results  Component Value Date   HGBA1C 5.1 02/20/2017           Assessment & Plan:   Carpal Tunnel Syndrome, Left:  Referral placed to neurology for EMG testing Continue Ibuprofen and stretching Advised her to get a "cock-up" splint and use as needed for symptom relief  Return precautions discussed Webb Silversmith, NP

## 2017-12-11 NOTE — Patient Instructions (Signed)
Carpal Tunnel Syndrome Carpal tunnel syndrome is a condition that causes pain in your hand and arm. The carpal tunnel is a narrow area that is on the palm side of your wrist. Repeated wrist motion or certain diseases may cause swelling in the tunnel. This swelling can pinch the main nerve in the wrist (median nerve). Follow these instructions at home: If you have a splint:  Wear it as told by your doctor. Remove it only as told by your doctor.  Loosen the splint if your fingers: ? Become numb and tingle. ? Turn blue and cold.  Keep the splint clean and dry. General instructions  Take over-the-counter and prescription medicines only as told by your doctor.  Rest your wrist from any activity that may be causing your pain. If needed, talk to your employer about changes that can be made in your work, such as getting a wrist pad to use while typing.  If directed, apply ice to the painful area: ? Put ice in a plastic bag. ? Place a towel between your skin and the bag. ? Leave the ice on for 20 minutes, 2-3 times per day.  Keep all follow-up visits as told by your doctor. This is important.  Do any exercises as told by your doctor, physical therapist, or occupational therapist. Contact a doctor if:  You have new symptoms.  Medicine does not help your pain.  Your symptoms get worse. This information is not intended to replace advice given to you by your health care provider. Make sure you discuss any questions you have with your health care provider. Document Released: 11/21/2011 Document Revised: 05/09/2016 Document Reviewed: 04/19/2015 Elsevier Interactive Patient Education  2018 [employer]  

## 2017-12-16 DIAGNOSIS — H269 Unspecified cataract: Secondary | ICD-10-CM

## 2017-12-16 DIAGNOSIS — G5600 Carpal tunnel syndrome, unspecified upper limb: Secondary | ICD-10-CM

## 2017-12-16 HISTORY — DX: Unspecified cataract: H26.9

## 2017-12-16 HISTORY — DX: Carpal tunnel syndrome, unspecified upper limb: G56.00

## 2017-12-19 DIAGNOSIS — H2512 Age-related nuclear cataract, left eye: Secondary | ICD-10-CM | POA: Diagnosis not present

## 2017-12-19 DIAGNOSIS — H2511 Age-related nuclear cataract, right eye: Secondary | ICD-10-CM | POA: Diagnosis not present

## 2017-12-23 ENCOUNTER — Encounter: Payer: Self-pay | Admitting: *Deleted

## 2017-12-30 ENCOUNTER — Other Ambulatory Visit: Payer: Self-pay

## 2017-12-30 ENCOUNTER — Encounter
Admission: RE | Disposition: A | Discharge status: Home / Self Care | Payer: Self-pay | Source: Ambulatory Visit | Attending: Ophthalmology

## 2017-12-30 ENCOUNTER — Ambulatory Visit: Payer: Medicare HMO | Admitting: Certified Registered Nurse Anesthetist

## 2017-12-30 ENCOUNTER — Encounter: Payer: Self-pay | Admitting: *Deleted

## 2017-12-30 ENCOUNTER — Ambulatory Visit
Admission: RE | Admit: 2017-12-30 | Discharge: 2017-12-30 | Disposition: A | Discharge status: Home / Self Care | Payer: Medicare HMO | Source: Ambulatory Visit | Attending: Ophthalmology | Admitting: Ophthalmology

## 2017-12-30 DIAGNOSIS — J45909 Unspecified asthma, uncomplicated: Secondary | ICD-10-CM | POA: Diagnosis not present

## 2017-12-30 DIAGNOSIS — G4730 Sleep apnea, unspecified: Secondary | ICD-10-CM | POA: Diagnosis not present

## 2017-12-30 DIAGNOSIS — Z87891 Personal history of nicotine dependence: Secondary | ICD-10-CM | POA: Diagnosis not present

## 2017-12-30 DIAGNOSIS — Z79899 Other long term (current) drug therapy: Secondary | ICD-10-CM | POA: Diagnosis not present

## 2017-12-30 DIAGNOSIS — F329 Major depressive disorder, single episode, unspecified: Secondary | ICD-10-CM | POA: Diagnosis not present

## 2017-12-30 DIAGNOSIS — H2512 Age-related nuclear cataract, left eye: Secondary | ICD-10-CM | POA: Diagnosis not present

## 2017-12-30 DIAGNOSIS — I10 Essential (primary) hypertension: Secondary | ICD-10-CM | POA: Diagnosis not present

## 2017-12-30 HISTORY — PX: CATARACT EXTRACTION W/PHACO: SHX586

## 2017-12-30 HISTORY — DX: Carpal tunnel syndrome, unspecified upper limb: G56.00

## 2017-12-30 HISTORY — DX: Syncope and collapse: R55

## 2017-12-30 HISTORY — DX: Cardiac arrhythmia, unspecified: I49.9

## 2017-12-30 HISTORY — DX: Unspecified cataract: H26.9

## 2017-12-30 SURGERY — PHACOEMULSIFICATION, CATARACT, WITH IOL INSERTION
Anesthesia: Topical | Site: Eye | Laterality: Left | Wound class: Clean

## 2017-12-30 MED ORDER — MOXIFLOXACIN HCL 0.5 % OP SOLN: 1.0000 [drp] | OPHTHALMIC | Status: DC | PRN

## 2017-12-30 MED ORDER — EPINEPHRINE PF 1 MG/ML IJ SOLN
INTRAMUSCULAR | Status: DC | PRN
  Administered 2017-12-30: 1 mL via OPHTHALMIC

## 2017-12-30 MED ORDER — ARMC OPHTHALMIC DILATING DROPS
1.0000 "application " | OPHTHALMIC | Status: AC
  Administered 2017-12-30 (×3): 1 via OPHTHALMIC

## 2017-12-30 MED ORDER — POVIDONE-IODINE 5 % OP SOLN
OPHTHALMIC | Status: AC
  Filled 2017-12-30: qty 30

## 2017-12-30 MED ORDER — NA CHONDROIT SULF-NA HYALURON 40-17 MG/ML IO SOLN
INTRAOCULAR | Status: DC | PRN
  Administered 2017-12-30: 1 mL via INTRAOCULAR

## 2017-12-30 MED ORDER — FENTANYL CITRATE (PF) 100 MCG/2ML IJ SOLN
INTRAMUSCULAR | Status: AC
  Filled 2017-12-30: qty 2

## 2017-12-30 MED ORDER — SODIUM CHLORIDE 0.9 % IV SOLN
INTRAVENOUS | Status: DC
  Administered 2017-12-30: 11:00:00 via INTRAVENOUS

## 2017-12-30 MED ORDER — LIDOCAINE HCL (PF) 4 % IJ SOLN
INTRAMUSCULAR | Status: AC
  Filled 2017-12-30: qty 5

## 2017-12-30 MED ORDER — LIDOCAINE HCL (PF) 4 % IJ SOLN
INTRAMUSCULAR | Status: DC | PRN
  Administered 2017-12-30: 2 mL via OPHTHALMIC

## 2017-12-30 MED ORDER — FENTANYL CITRATE (PF) 100 MCG/2ML IJ SOLN
INTRAMUSCULAR | Status: DC | PRN
  Administered 2017-12-30: 75 ug via INTRAVENOUS
  Administered 2017-12-30: 25 ug via INTRAVENOUS

## 2017-12-30 MED ORDER — NA CHONDROIT SULF-NA HYALURON 40-17 MG/ML IO SOLN
INTRAOCULAR | Status: AC
  Filled 2017-12-30: qty 1

## 2017-12-30 MED ORDER — MOXIFLOXACIN HCL 0.5 % OP SOLN
OPHTHALMIC | Status: DC | PRN
  Administered 2017-12-30: 0.2 mL via OPHTHALMIC

## 2017-12-30 MED ORDER — ARMC OPHTHALMIC DILATING DROPS
OPHTHALMIC | Status: AC
  Administered 2017-12-30: 11:00:00
  Filled 2017-12-30: qty 0.4

## 2017-12-30 MED ORDER — POVIDONE-IODINE 5 % OP SOLN
OPHTHALMIC | Status: DC | PRN
  Administered 2017-12-30: 1 via OPHTHALMIC

## 2017-12-30 MED ORDER — ARMC OPHTHALMIC DILATING DROPS: 1.0000 "application " | OPHTHALMIC | Status: AC

## 2017-12-30 MED ORDER — EPINEPHRINE PF 1 MG/ML IJ SOLN
INTRAMUSCULAR | Status: AC
  Filled 2017-12-30: qty 1

## 2017-12-30 MED ORDER — MOXIFLOXACIN HCL 0.5 % OP SOLN
OPHTHALMIC | Status: AC
  Filled 2017-12-30: qty 3

## 2017-12-30 MED ORDER — CARBACHOL 0.01 % IO SOLN
INTRAOCULAR | Status: DC | PRN
  Administered 2017-12-30: .5 mL via INTRAOCULAR

## 2017-12-30 SURGICAL SUPPLY — 16 items
GLOVE BIO SURGEON STRL SZ8 (GLOVE) ×3 IMPLANT
GLOVE BIOGEL M 6.5 STRL (GLOVE) ×3 IMPLANT
GLOVE SURG LX 8.0 MICRO (GLOVE) ×2
GLOVE SURG LX STRL 8.0 MICRO (GLOVE) ×1 IMPLANT
GOWN STRL REUS W/ TWL LRG LVL3 (GOWN DISPOSABLE) ×2 IMPLANT
GOWN STRL REUS W/TWL LRG LVL3 (GOWN DISPOSABLE) ×4
LABEL CATARACT MEDS ST (LABEL) ×3 IMPLANT
LENS IOL TECNIS ITEC 20.5 (Intraocular Lens) ×3 IMPLANT
PACK CATARACT (MISCELLANEOUS) ×3 IMPLANT
PACK CATARACT BRASINGTON LX (MISCELLANEOUS) ×3 IMPLANT
PACK EYE AFTER SURG (MISCELLANEOUS) ×3 IMPLANT
SOL BSS BAG (MISCELLANEOUS) ×3
SOLUTION BSS BAG (MISCELLANEOUS) ×1 IMPLANT
SYR 5ML LL (SYRINGE) ×3 IMPLANT
WATER STERILE IRR 250ML POUR (IV SOLUTION) ×3 IMPLANT
WIPE NON LINTING 3.25X3.25 (MISCELLANEOUS) ×3 IMPLANT

## 2017-12-30 NOTE — Anesthesia Postprocedure Evaluation (Signed)
Anesthesia Post Note  Patient: Molly Faulkner  Procedure(s) Performed: CATARACT EXTRACTION PHACO AND INTRAOCULAR LENS PLACEMENT (IOC) (Left Eye)  Patient location during evaluation: PACU Anesthesia Type: MAC Level of consciousness: awake and alert and oriented Pain management: satisfactory to patient Vital Signs Assessment: post-procedure vital signs reviewed and stable Respiratory status: respiratory function stable Cardiovascular status: stable Anesthetic complications: no     Last Vitals:  Vitals:   12/30/17 1048  BP: 135/76  Pulse: (!) 52  Resp: 16  Temp: (!) 36 C  SpO2: 99%    Last Pain:  Vitals:   12/30/17 1048  TempSrc: Tympanic                 Blima Singer

## 2017-12-30 NOTE — Anesthesia Procedure Notes (Signed)
Procedure Name: MAC Performed by: Shaheem Pichon, CRNA Pre-anesthesia Checklist: Patient identified, Emergency Drugs available, Suction available, Patient being monitored and Timeout performed Oxygen Delivery Method: Nasal cannula       

## 2017-12-30 NOTE — Anesthesia Post-op Follow-up Note (Signed)
Anesthesia QCDR form completed.        

## 2017-12-30 NOTE — Discharge Instructions (Signed)
Eye Surgery Discharge Instructions  Expect mild scratchy sensation or mild soreness. DO NOT RUB YOUR EYE!  The day of surgery:  Minimal physical activity, but bed rest is not required  No reading, computer work, or close hand work  No bending, lifting, or straining.  May watch TV  For 24 hours:  No driving, legal decisions, or alcoholic beverages  Safety precautions  Eat anything you prefer: It is better to start with liquids, then soup then solid foods.  _____ Eye patch should be worn until postoperative exam tomorrow.  ____ Solar shield eyeglasses should be worn for comfort in the sunlight/patch while sleeping  Resume all regular medications including aspirin or Coumadin if these were discontinued prior to surgery. You may shower, bathe, shave, or wash your hair. Tylenol may be taken for mild discomfort.  Call your doctor if you experience significant pain, nausea, or vomiting, fever > 101 or other signs of infection. [phone] or [phone] Specific instructions:  Follow-up Information    Birder Robson, MD Follow up on 12/31/2017.   Specialty:  Ophthalmology Why:  11:00 Contact information: [address] [place] [address] [phone]

## 2017-12-30 NOTE — Op Note (Signed)
PREOPERATIVE DIAGNOSIS:  Nuclear sclerotic cataract of the left eye.   POSTOPERATIVE DIAGNOSIS:  Nuclear sclerotic cataract of the left eye.   OPERATIVE PROCEDURE: Procedure(s): CATARACT EXTRACTION PHACO AND INTRAOCULAR LENS PLACEMENT (IOC)   SURGEON:  Birder Robson, MD.   ANESTHESIA:  Anesthesiologist: Molli Barrows, MD CRNA: Demetrius Charity, CRNA  1.      Managed anesthesia care. 2.     0.67ml of Shugarcaine was instilled following the paracentesis   COMPLICATIONS:  None.   TECHNIQUE:   Stop and chop   DESCRIPTION OF PROCEDURE:  The patient was examined and consented in the preoperative holding area where the aforementioned topical anesthesia was applied to the left eye and then brought back to the Operating Room where the left eye was prepped and draped in the usual sterile ophthalmic fashion and a lid speculum was placed. A paracentesis was created with the side port blade and the anterior chamber was filled with viscoelastic. A near clear corneal incision was performed with the steel keratome. A continuous curvilinear capsulorrhexis was performed with a cystotome followed by the capsulorrhexis forceps. Hydrodissection and hydrodelineation were carried out with BSS on a blunt cannula. The lens was removed in a stop and chop  technique and the remaining cortical material was removed with the irrigation-aspiration handpiece. The capsular bag was inflated with viscoelastic and the Technis [id number] lens was placed in the capsular bag without complication. The remaining viscoelastic was removed from the eye with the irrigation-aspiration handpiece. The wounds were hydrated. The anterior chamber was flushed with Miostat and the eye was inflated to physiologic pressure. 0.103ml Vigamox was placed in the anterior chamber. The wounds were found to be water tight. The eye was dressed with Vigamox. The patient was given protective glasses to wear throughout the day and a shield with which to sleep  tonight. The patient was also given drops with which to begin a drop regimen today and will follow-up with me in one day. Implant Name Type Inv. Item Serial No. Manufacturer Lot No. LRB No. Used  LENS IOL DIOP 20.5 - [id number] Intraocular Lens LENS IOL DIOP 20.5 [id number] AMO  Left 1    Procedure(s) with comments: CATARACT EXTRACTION PHACO AND INTRAOCULAR LENS PLACEMENT (IOC) (Left) - [place] 00:58 AP% 11.6 CDE 6.80 Fluid pack lot # [id number]  Electronically signed: Birder Robson 12/30/2017 12:02 PM

## 2017-12-30 NOTE — H&P (Signed)
All labs reviewed. Abnormal studies sent to patients PCP when indicated.  Previous H&P reviewed, patient examined, there are NO CHANGES.  Molly Bernasconi Porfilio1/15/201911:38 AM

## 2017-12-30 NOTE — Transfer of Care (Signed)
Immediate Anesthesia Transfer of Care Note  Patient: Molly Faulkner  Procedure(s) Performed: CATARACT EXTRACTION PHACO AND INTRAOCULAR LENS PLACEMENT (IOC) (Left Eye)  Patient Location: PACU  Anesthesia Type:MAC  Level of Consciousness: awake, alert  and oriented  Airway & Oxygen Therapy: Patient Spontanous Breathing  Post-op Assessment: Report given to RN and Post -op Vital signs reviewed and stable  Post vital signs: Reviewed and stable  Last Vitals:  Vitals:   12/30/17 1048  BP: 135/76  Pulse: (!) 52  Resp: 16  Temp: (!) 36 C  SpO2: 99%    Last Pain:  Vitals:   12/30/17 1048  TempSrc: Tympanic         Complications: No apparent anesthesia complications

## 2017-12-31 ENCOUNTER — Encounter: Payer: Self-pay | Admitting: Ophthalmology

## 2018-01-01 NOTE — Anesthesia Preprocedure Evaluation (Signed)
Anesthesia Evaluation  Patient identified by MRN, date of birth, ID band Patient awake    Reviewed: Allergy & Precautions, H&P , NPO status , Patient's Chart, lab work & pertinent test results, reviewed documented beta blocker date and time   Airway Mallampati: II  TM Distance: >3 FB Neck ROM: full    Dental no notable dental hx. (+) Teeth Intact   Pulmonary neg pulmonary ROS, asthma , sleep apnea , former smoker,    Pulmonary exam normal breath sounds clear to auscultation       Cardiovascular Exercise Tolerance: Good hypertension, negative cardio ROS   Rhythm:regular Rate:Normal     Neuro/Psych negative neurological ROS  negative psych ROS   GI/Hepatic negative GI ROS, Neg liver ROS, GERD  ,  Endo/Other  negative endocrine ROSdiabetes  Renal/GU      Musculoskeletal   Abdominal   Peds  Hematology negative hematology ROS (+)   Anesthesia Other Findings   Reproductive/Obstetrics negative OB ROS                             Anesthesia Physical Anesthesia Plan  ASA: III  Anesthesia Plan: MAC   Post-op Pain Management:    Induction:   PONV Risk Score and Plan:   Airway Management Planned:   Additional Equipment:   Intra-op Plan:   Post-operative Plan:   Informed Consent: I have reviewed the patients History and Physical, chart, labs and discussed the procedure including the risks, benefits and alternatives for the proposed anesthesia with the patient or authorized representative who has indicated his/her understanding and acceptance.     Plan Discussed with: CRNA  Anesthesia Plan Comments:         Anesthesia Quick Evaluation

## 2018-01-06 IMAGING — DX DG LUMBAR SPINE COMPLETE 4+V
5 series · 5 of 5 positions shown · non-contrast
Comparison: None.

CLINICAL DATA: 76-year-old female with left hip pain. No reported
trauma. Initial encounter.

EXAM:
LUMBAR SPINE - COMPLETE 4+ VIEW

[l-spine ap]
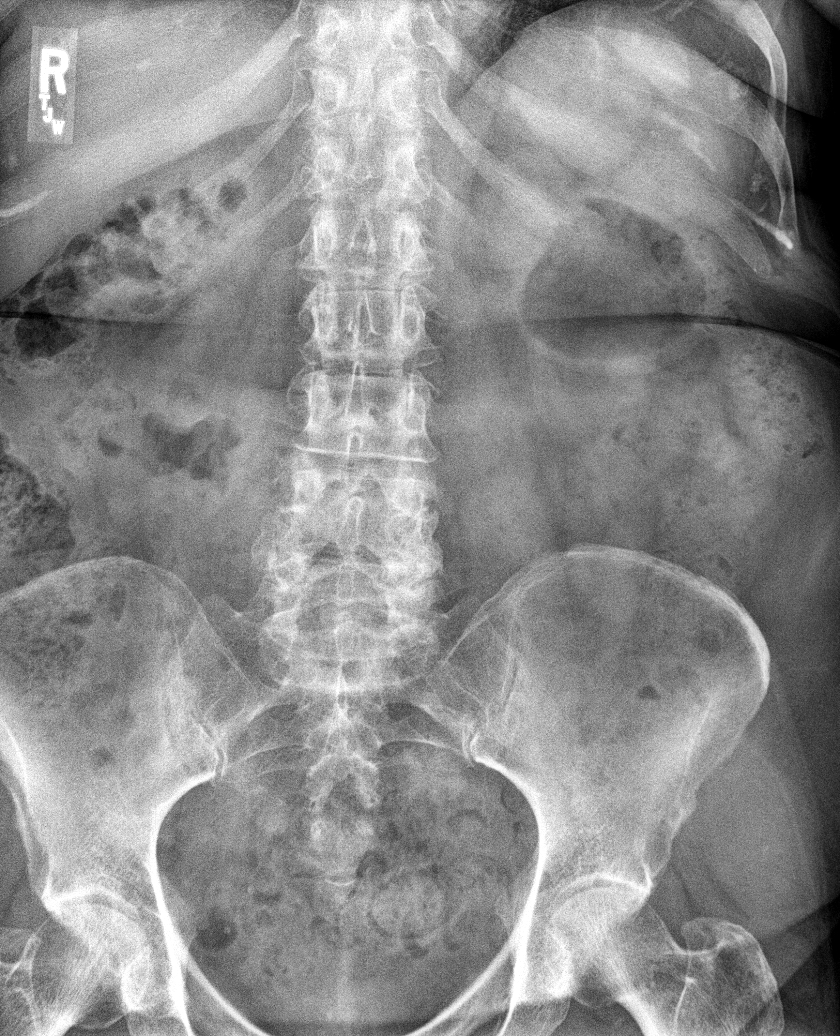

[l-spine obl (1 of 2)]
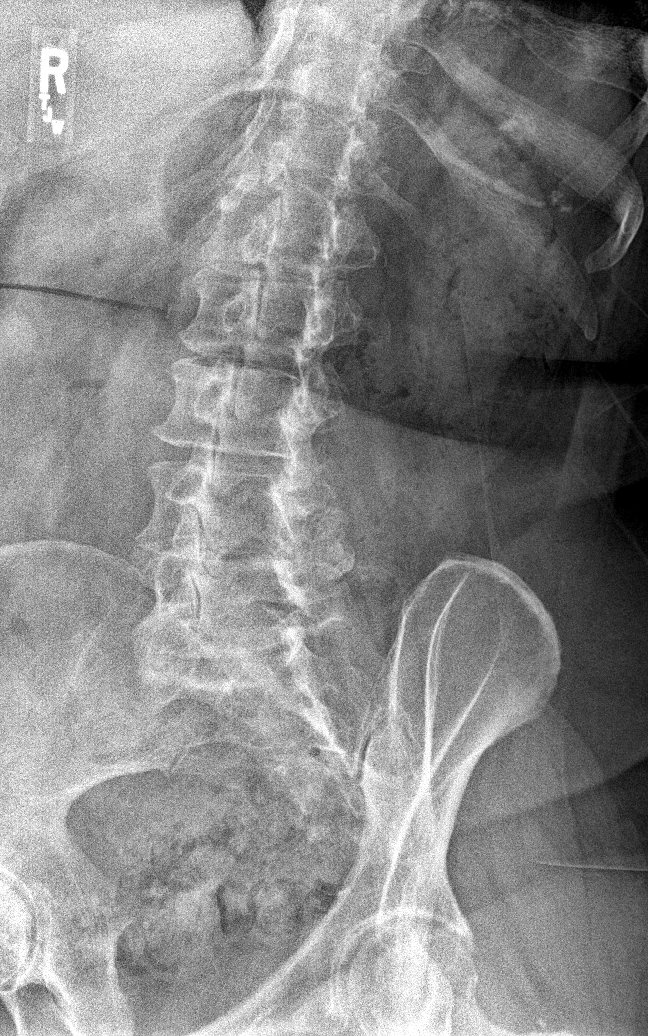

[l-spine obl (2 of 2)]
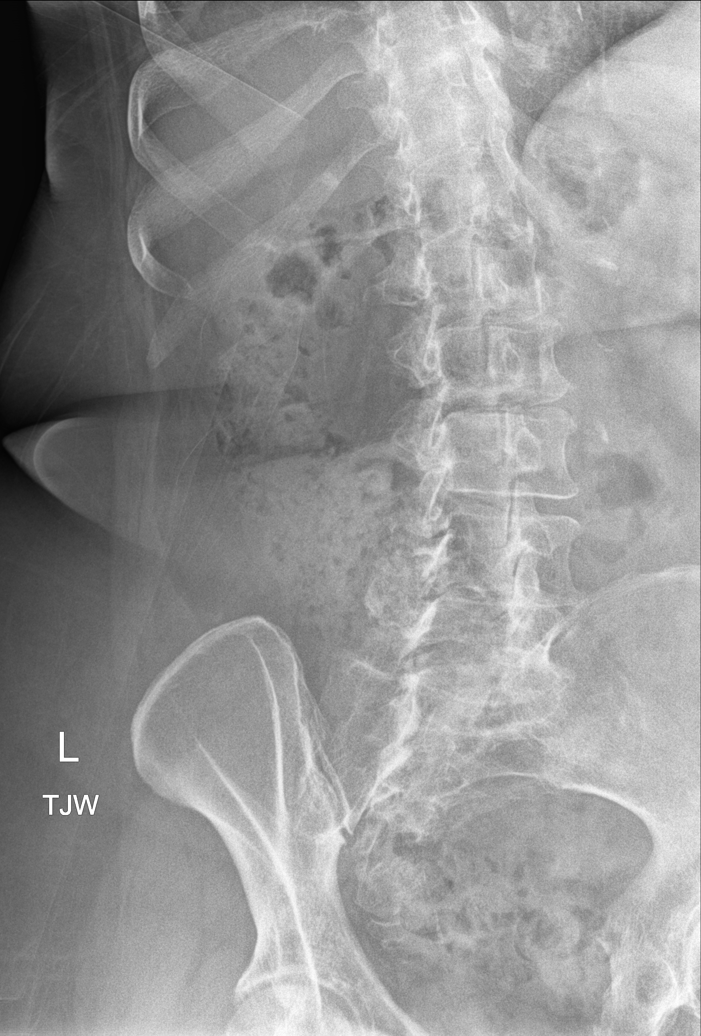

[l-spine lat]
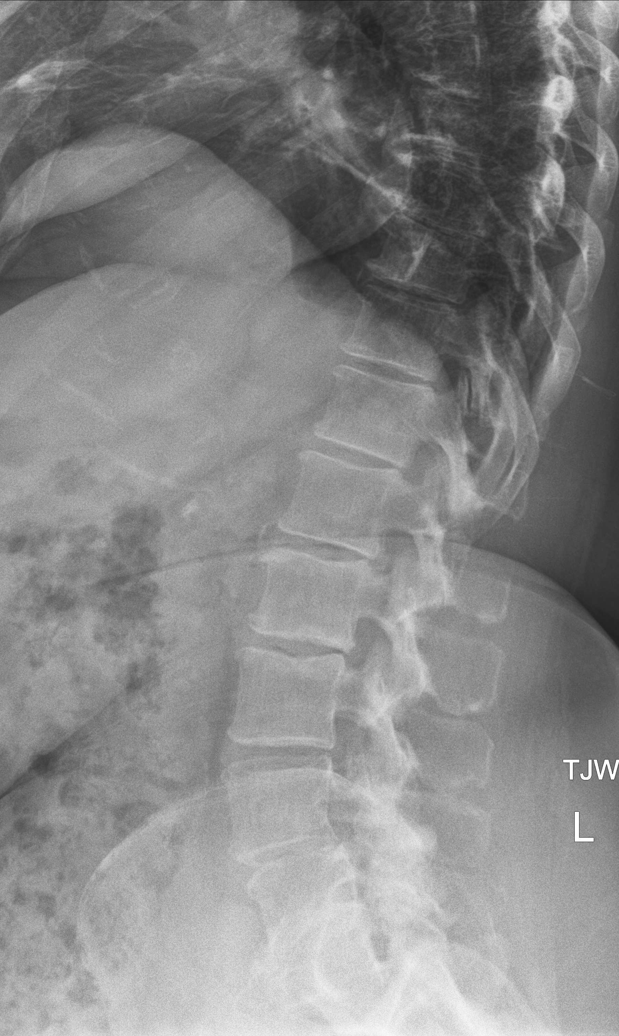

[l-spine l5/s1]
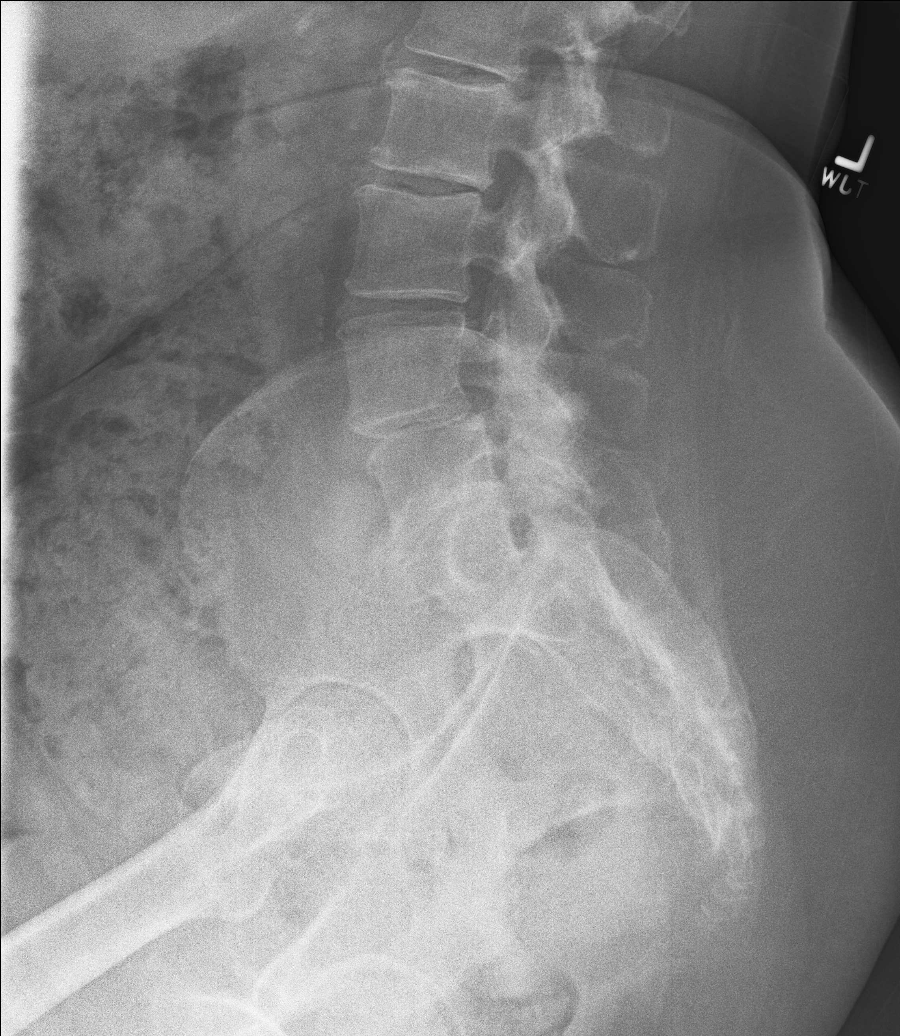

[5 of 5 positions shown; findings below may reference images not displayed]

FINDINGS: Transitional vertebra lumbosacral junction.

Minimal curvature lumbar sine.

Facet degenerative changes lower lumbar spine.

Minimal anterior slip L4.

Mild disc space narrowing L1-2 thru L4-5.

No pars defect detected.

Vascular calcifications.
IMPRESSION: Transitional vertebra lumbosacral junction.

Facet degenerative changes lower lumbar spine.

Minimal anterior slip L4.

Mild disc space narrowing L1-2 thru L4-5.

Aortic Atherosclerosis ([id number]).

## 2018-01-06 IMAGING — DX DG HIP (WITH OR WITHOUT PELVIS) 2-3V*L*
3 series · 3 of 3 positions shown · non-contrast
Comparison: None.

CLINICAL DATA: 76-year-old female with left hip pain. No reported
trauma. Initial encounter.

EXAM:
DG HIP (WITH OR WITHOUT PELVIS) 2-3V LEFT

[pelvis ap]
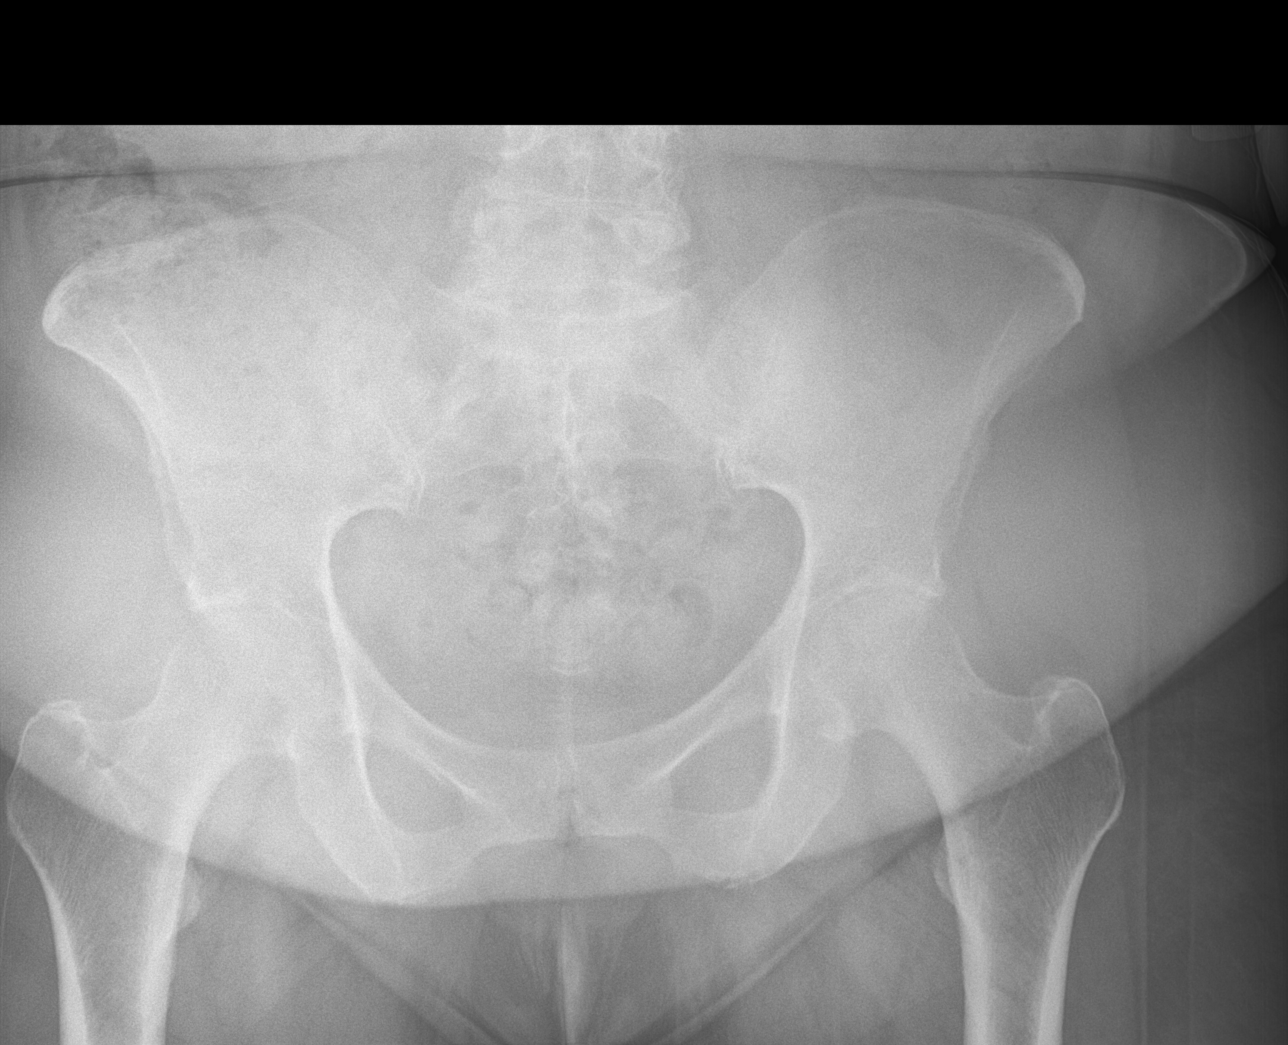

[hip ap]
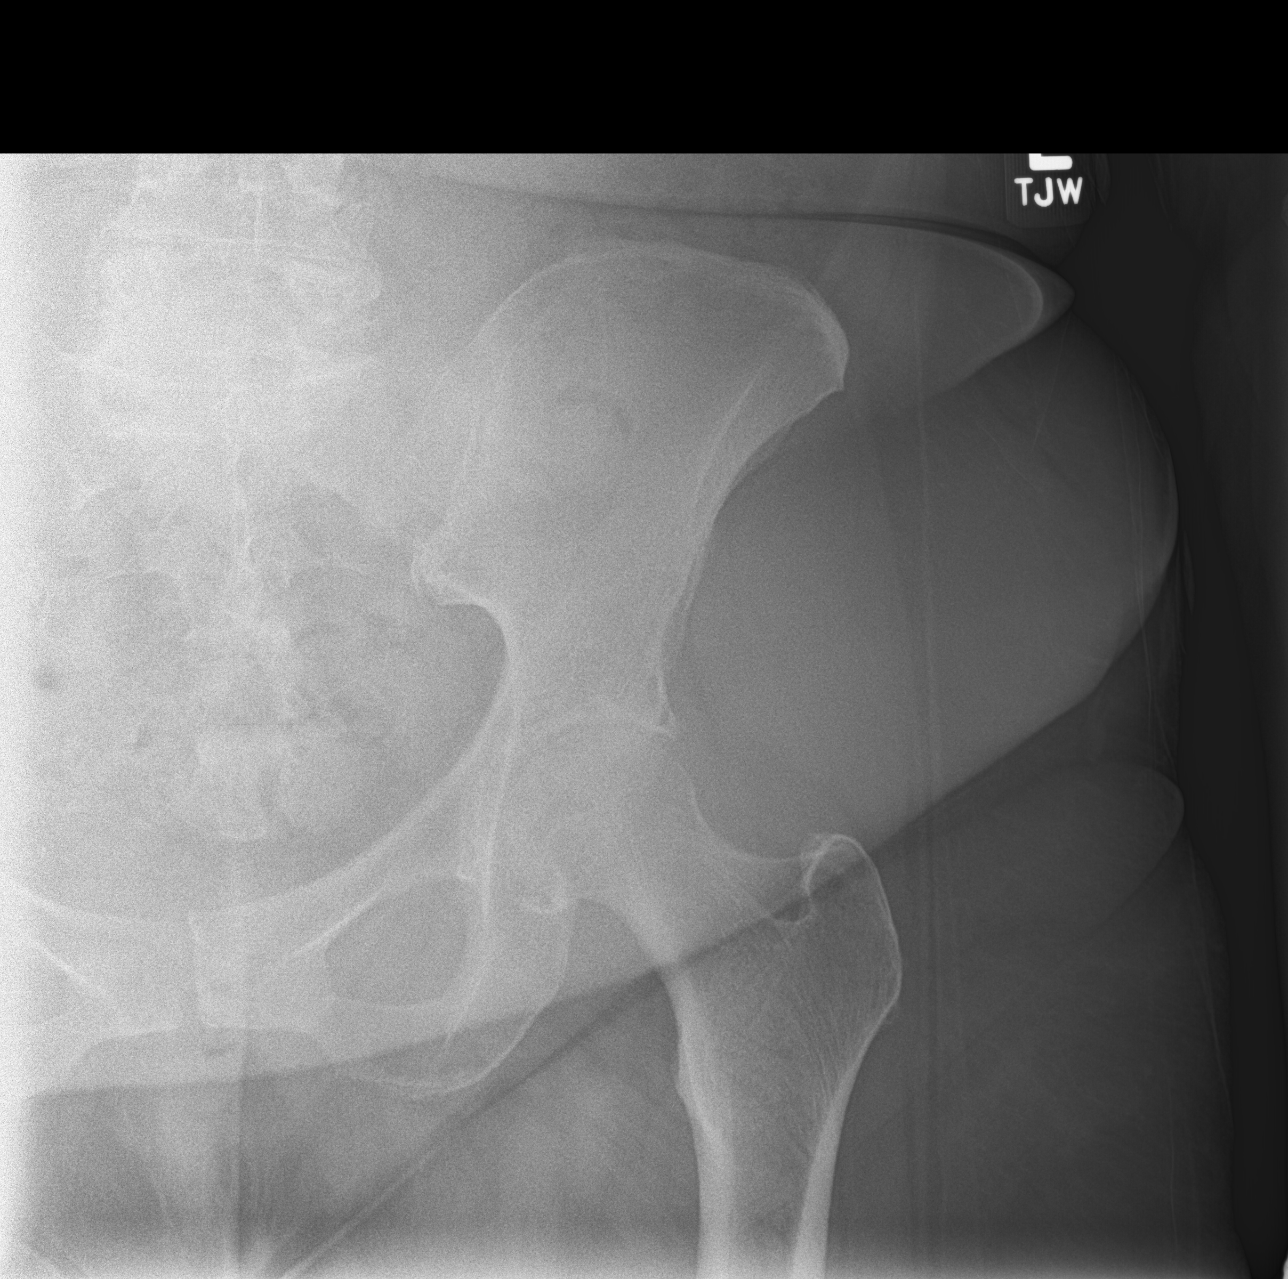

[hip lat]
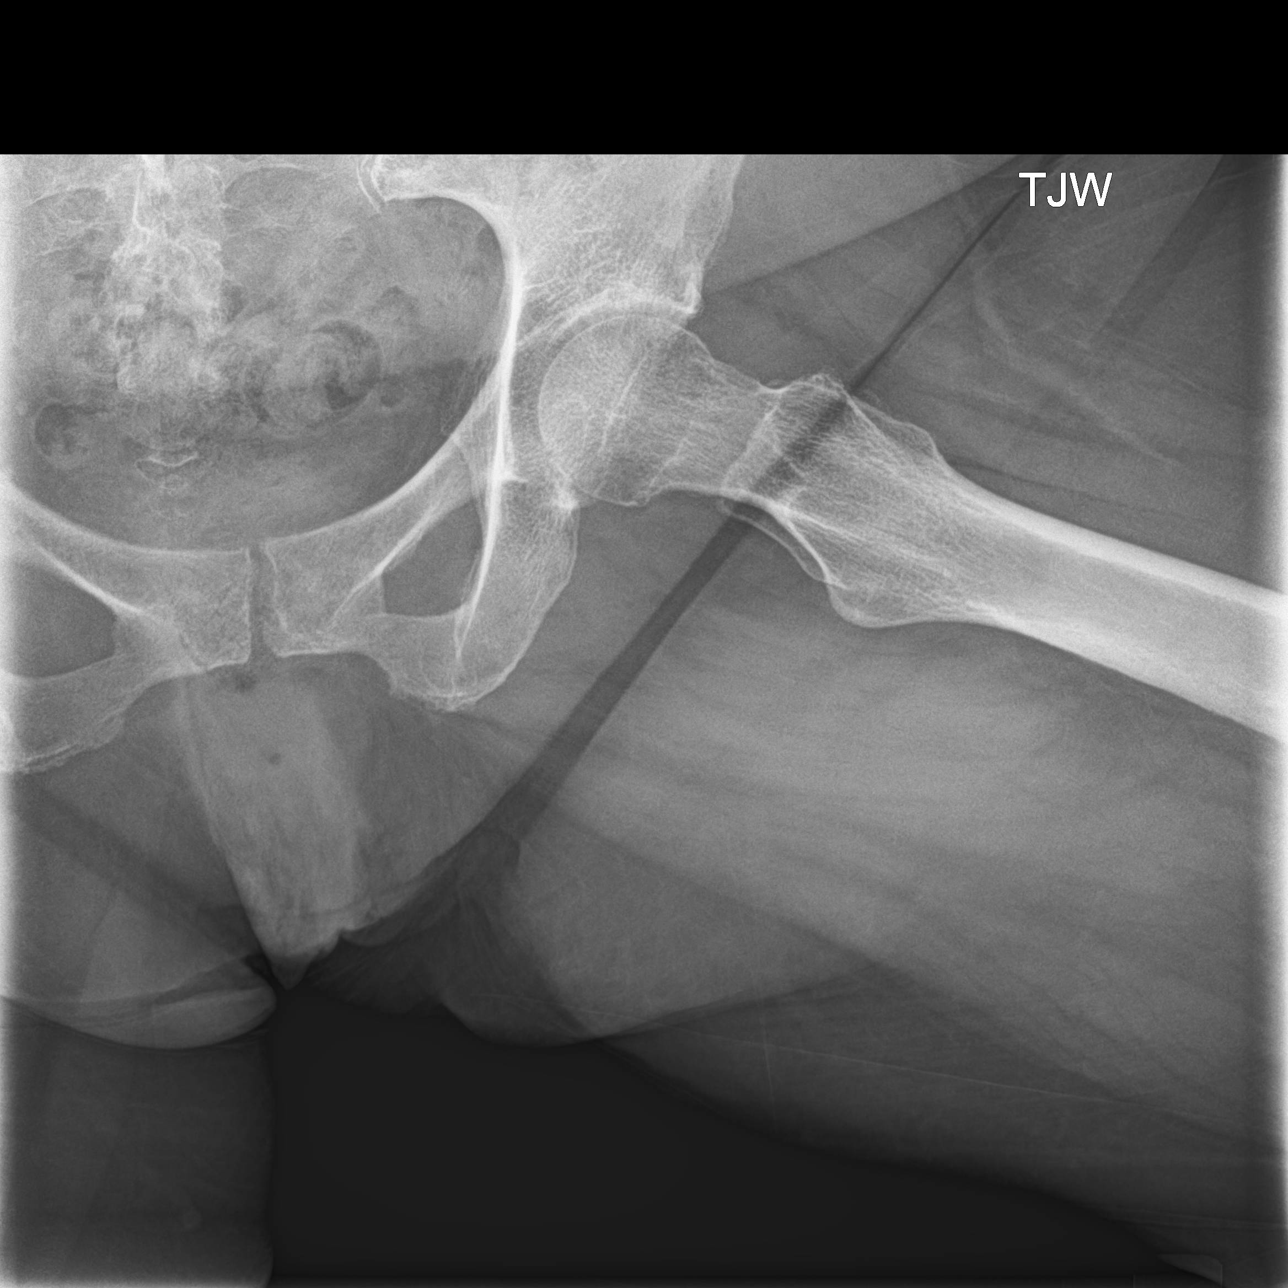

[3 of 3 positions shown; findings below may reference images not displayed]

FINDINGS: Mild bilateral hip joint degenerative changes.

No evidence of left femoral head avascular necrosis.

No fracture or dislocation.
IMPRESSION: Mild bilateral hip joint degenerative changes.

## 2018-01-12 DIAGNOSIS — G5600 Carpal tunnel syndrome, unspecified upper limb: Secondary | ICD-10-CM | POA: Diagnosis not present

## 2018-01-27 ENCOUNTER — Other Ambulatory Visit: Payer: Self-pay | Admitting: Internal Medicine

## 2018-01-27 DIAGNOSIS — G5603 Carpal tunnel syndrome, bilateral upper limbs: Secondary | ICD-10-CM | POA: Diagnosis not present

## 2018-01-27 DIAGNOSIS — H35353 Cystoid macular degeneration, bilateral: Secondary | ICD-10-CM | POA: Diagnosis not present

## 2018-02-27 ENCOUNTER — Other Ambulatory Visit: Payer: Self-pay | Admitting: Internal Medicine

## 2018-03-23 ENCOUNTER — Ambulatory Visit: Payer: Medicare HMO | Admitting: Internal Medicine

## 2018-03-23 ENCOUNTER — Encounter: Payer: Self-pay | Admitting: Internal Medicine

## 2018-03-23 VITALS — BP 140/80 | HR 57 | Ht 61.0 in | Wt 192.0 lb

## 2018-03-23 DIAGNOSIS — J4520 Mild intermittent asthma, uncomplicated: Secondary | ICD-10-CM

## 2018-03-23 DIAGNOSIS — G4733 Obstructive sleep apnea (adult) (pediatric): Secondary | ICD-10-CM | POA: Diagnosis not present

## 2018-03-23 NOTE — Progress Notes (Signed)
  [facility] Consultation    Date: 03/23/2018  MRN# [id number] Molly Faulkner November 07, 1941  Referring Physician: NP Mel Almond PMD - NP [name] is a 77 y.o. old female seen in consultation for sleep apnea evaluation and ASTHMA  CC:  Follow up ASTHMA Follow up OSA  HPI:  Follow up sleep assessment and ASTHMA OSA therapy working well 100% compliant, no leaks and AHI 1.1 On CPAP 10 cm h20 Patient tolerating nasal pillows and doing well, is well rested She has dx of asthma Used ventolin twice on last 2 months Uses advair and is well controlled at this time No signs of infection at this time No signs of CHF  Patient is retired Therapist, sports    Allergies:  Avelox [moxifloxacin hcl in nacl]; Trazodone and nefazodone; and Clarithromycin  Review of Systems  Constitutional: Negative for chills, diaphoresis, fever and malaise/fatigue.  HENT: Negative for hearing loss.   Eyes: Negative for blurred vision and double vision.  Respiratory: Negative for cough, hemoptysis, sputum production, shortness of breath and wheezing.   Cardiovascular: Negative for chest pain.  Gastrointestinal: Negative for heartburn, nausea and vomiting.  Genitourinary: Negative for dysuria.  Musculoskeletal: Negative for myalgias.  Skin: Negative for rash.  Neurological: Negative for dizziness.  Endo/Heme/Allergies: Negative for environmental allergies. Does not bruise/bleed easily.  All other systems reviewed and are negative.   BP 140/80 (BP Location: Left Arm, Cuff Size: Normal)   Pulse (!) 57   Ht 5\' 1"  (1.549 m)   Wt 192 lb (87.1 kg)   SpO2 98%   BMI 36.28 kg/m   Physical Examination:   VS: Ht 5\' 1"  (1.549 m)   Wt 192 lb (87.1 kg)   BMI 36.28 kg/m   General Appearance: No distress  Neuro:without focal findings, mental status, speech normal, alert and oriented, cranial nerves 2-12 intact, reflexes normal and symmetric, sensation grossly normal  HEENT: PERRLA, EOM intact, no  ptosis, no other lesions noticed; Mallampati 3 Pulmonary: normal breath sounds., diaphragmatic excursion normal.No wheezing, No rales;   Sputum Production:  none CardiovascularNormal S1,S2.  No m/r/g.  Abdominal aorta pulsation normal.    Extremities: normal, no cyanosis, clubbing, no edema, warm with normal capillary refill. Other findings:none    Assessment and Plan: 77 year old female with OSA and ASTHMA OSA is under control with CPAP therapy and doing well. Her ASTHM is very mild Intermittent and under control with current inhaler therapy  1.OSA continue CPAP therapy as prescribed current AHI is 1.1 on CPAP therapy   2.continue advair therapy as prescribed -albuterol as needed   3.Allergic Rhinitis controlled with Nasocort and OTC antihistmines  Follow up in 6 months   Patient satisfied with Plan of action and management. All questions answered  Corrin Parker, M.D.  [facility]  Medical Director [place] Director [facility] Cardio-Pulmonary Department

## 2018-03-23 NOTE — Patient Instructions (Signed)
Continue inhalers as prescribed Continue CPAP therapy as prescribed  

## 2018-03-24 ENCOUNTER — Ambulatory Visit: Payer: Self-pay | Admitting: Internal Medicine

## 2018-03-24 DIAGNOSIS — G5603 Carpal tunnel syndrome, bilateral upper limbs: Secondary | ICD-10-CM | POA: Diagnosis not present

## 2018-03-25 ENCOUNTER — Other Ambulatory Visit: Payer: Self-pay | Admitting: Family Medicine

## 2018-03-25 DIAGNOSIS — Z1231 Encounter for screening mammogram for malignant neoplasm of breast: Secondary | ICD-10-CM

## 2018-03-25 DIAGNOSIS — H35353 Cystoid macular degeneration, bilateral: Secondary | ICD-10-CM | POA: Diagnosis not present

## 2018-04-23 ENCOUNTER — Ambulatory Visit
Admission: RE | Admit: 2018-04-23 | Discharge: 2018-04-23 | Disposition: A | Discharge status: Home / Self Care | Payer: Medicare HMO | Source: Ambulatory Visit | Attending: Family Medicine | Admitting: Family Medicine

## 2018-04-23 DIAGNOSIS — Z1231 Encounter for screening mammogram for malignant neoplasm of breast: Secondary | ICD-10-CM | POA: Insufficient documentation

## 2018-04-27 ENCOUNTER — Other Ambulatory Visit: Payer: Self-pay | Admitting: Internal Medicine

## 2018-04-28 NOTE — Telephone Encounter (Signed)
Please advise, I do not see where this has been refilled recently.Marland KitchenMarland Kitchen

## 2018-04-29 NOTE — Telephone Encounter (Signed)
Give 30 day supply. Due for Medicare Wellness Visit

## 2018-05-05 ENCOUNTER — Other Ambulatory Visit: Payer: Self-pay | Admitting: Internal Medicine

## 2018-05-25 DIAGNOSIS — H35353 Cystoid macular degeneration, bilateral: Secondary | ICD-10-CM | POA: Diagnosis not present

## 2018-05-26 DIAGNOSIS — G4733 Obstructive sleep apnea (adult) (pediatric): Secondary | ICD-10-CM | POA: Diagnosis not present

## 2018-05-27 ENCOUNTER — Other Ambulatory Visit: Payer: Self-pay | Admitting: Internal Medicine

## 2018-05-29 ENCOUNTER — Ambulatory Visit (INDEPENDENT_AMBULATORY_CARE_PROVIDER_SITE_OTHER): Payer: Medicare HMO | Admitting: Internal Medicine

## 2018-05-29 ENCOUNTER — Encounter: Payer: Self-pay | Admitting: Internal Medicine

## 2018-05-29 VITALS — BP 132/80 | HR 63 | Temp 98.8°F | Ht 61.0 in | Wt 190.0 lb

## 2018-05-29 DIAGNOSIS — Z0000 Encounter for general adult medical examination without abnormal findings: Secondary | ICD-10-CM | POA: Diagnosis not present

## 2018-05-29 DIAGNOSIS — Z9989 Dependence on other enabling machines and devices: Secondary | ICD-10-CM

## 2018-05-29 DIAGNOSIS — Z1211 Encounter for screening for malignant neoplasm of colon: Secondary | ICD-10-CM

## 2018-05-29 DIAGNOSIS — K219 Gastro-esophageal reflux disease without esophagitis: Secondary | ICD-10-CM | POA: Diagnosis not present

## 2018-05-29 DIAGNOSIS — M479 Spondylosis, unspecified: Secondary | ICD-10-CM

## 2018-05-29 DIAGNOSIS — G4733 Obstructive sleep apnea (adult) (pediatric): Secondary | ICD-10-CM | POA: Diagnosis not present

## 2018-05-29 DIAGNOSIS — E559 Vitamin D deficiency, unspecified: Secondary | ICD-10-CM | POA: Diagnosis not present

## 2018-05-29 DIAGNOSIS — G4709 Other insomnia: Secondary | ICD-10-CM | POA: Diagnosis not present

## 2018-05-29 DIAGNOSIS — J4540 Moderate persistent asthma, uncomplicated: Secondary | ICD-10-CM

## 2018-05-29 DIAGNOSIS — E7800 Pure hypercholesterolemia, unspecified: Secondary | ICD-10-CM | POA: Diagnosis not present

## 2018-05-29 DIAGNOSIS — I10 Essential (primary) hypertension: Secondary | ICD-10-CM

## 2018-05-29 DIAGNOSIS — F329 Major depressive disorder, single episode, unspecified: Secondary | ICD-10-CM

## 2018-05-29 DIAGNOSIS — N898 Other specified noninflammatory disorders of vagina: Secondary | ICD-10-CM | POA: Diagnosis not present

## 2018-05-29 MED ORDER — MIRTAZAPINE 15 MG PO TBDP: 15.0000 mg | ORAL_TABLET | Freq: Every day | ORAL | 0 refills | Status: DC

## 2018-05-29 MED ORDER — CELECOXIB 50 MG PO CAPS: 50.0000 mg | ORAL_CAPSULE | Freq: Two times a day (BID) | ORAL | 2 refills | Status: DC

## 2018-05-29 NOTE — Assessment & Plan Note (Signed)
Continue Tums as needed CBC and CMET today

## 2018-05-29 NOTE — Assessment & Plan Note (Signed)
Stable off meds °Will monitor °

## 2018-05-29 NOTE — Assessment & Plan Note (Signed)
Controlled on Losartan, HCTZ and Atenolol Reinforced DASH diet CBC and CMET today

## 2018-05-29 NOTE — Assessment & Plan Note (Signed)
Stable on Advair and Albuterol She will continue to follow with pulmonology

## 2018-05-29 NOTE — Addendum Note (Signed)
Addended by: Lurlean Nanny on: 05/29/2018 03:55 PM   Modules accepted: Orders

## 2018-05-29 NOTE — Assessment & Plan Note (Signed)
CMET and Lipid profile today Encouraged her to consume a low fat diet Continue Fish Oil for now

## 2018-05-29 NOTE — Assessment & Plan Note (Signed)
Remeron refilled today

## 2018-05-29 NOTE — Progress Notes (Signed)
HPI:  Pt presents to the clinic today for her Medicare Wellness Exam. She is also due to follow up chronic conditions.  Arthritis: Mainly in her hands and back. She feels like her back pain is worse. She takes Ibuprofen as needed with minimal relief. She would also like a referral for physical therapy.  Asthma: Moderate, persistent. Controlled on Advair and Albuterol. She follows with Dr. Mortimer Fries  Depression: Improved. Not medicated.  GERD: Triggered by acidic foods. She takes Tums as needed with good relief.  HLD: Her last LDL was 109, 03/2017. She is taking Fish Oil as prescribed. She tries to consume a low fat diet.  HTN: Her BP today is 132/80. She is taking Losartan, HCTZ and Atenolol as prescribed. ECG from 03/2017 reviewed.  OSA: She is wearing her CPAP as instructed. She does feel rested when she wakes up. She takes Remeron as needed some nights prior to sleep. She would like a refill of that today.  Past Medical History:  Diagnosis Date  . Allergy   . Arthritis   . Asthma   . Carpal tunnel syndrome 12/2017   left, referred to neurology for EMG testing  . 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/2017  . Chicken pox   . Depression   . Diverticulitis   . Diverticulosis   . Dysrhythmia    paroxysmal tachycardia  . GERD (gastroesophageal reflux disease)   . Hx of colonic polyps   . Hyperlipidemia   . Hypertension   . Insomnia   . 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 Sleep apnea    H/O   NO LONGER  . Syncope     Current Outpatient Medications  Medication Sig Dispense Refill  . ADVAIR HFA 115-21 MCG/ACT inhaler INHALE 1 PUFF BY MOUTH TWICE DAILY 12 g 1  . albuterol (PROVENTIL HFA;VENTOLIN HFA) 108 (90 Base) MCG/ACT inhaler Inhale 2 puffs into the lungs every 6 (six) hours as needed for wheezing or shortness of breath. 1 Inhaler 2  . ARNICA EX Apply 1 application topically 4 (four) times daily as needed (for arthritis pain.).    Marland Kitchen atenolol (TENORMIN) 50 MG tablet TAKE 1 AND 1/2 TABLETS DAILY. 135  tablet 0  . cholecalciferol (VITAMIN D) 1000 units tablet Take 1,000 Units by mouth every other day. At bedtime.    Marland Kitchen CREAM BASE EX Apply 1-2 application topically 4 (four) times daily as needed (for arthritis pain.). Penetrex Inflammation Formula (Arnica/Pyridoxine/MSM/Boswellia/Cetyl Myristoleate)    . hydrochlorothiazide (HYDRODIURIL) 25 MG tablet TAKE 1 TABLET EVERY DAY 90 tablet 0  . ibuprofen (ADVIL,MOTRIN) 200 MG tablet Take 200 mg by mouth at bedtime as needed (for arthritis pain.).    Marland Kitchen Liniments (ORTHOGEL EX) Apply 1-2 application topically 4 (four) times daily as needed (for arthritis pain relief.).    Marland Kitchen losartan (COZAAR) 50 MG tablet TAKE 1 TABLET TWICE DAILY 180 tablet 3  . Magnesium Chloride POWD Apply 1 application topically 4 (four) times daily as needed (for arthritis pain.). Magnesium Oil with Arnica    . Menthol-Methyl Salicylate (PAIN RELIEVING) CREA Apply 1 application topically 4 (four) times daily as needed (for arthritis pain). EFAC PAIN RELIEVING CREAM    . Misc Natural Products (LEG VEIN & CIRCULATION PO) Take 1 capsule by mouth every other day. In the morning. Jeannetta Nap Circulation and Vein Support    . Multiple Vitamins-Minerals (EYE SUPPORT PO) Take 1 tablet by mouth at bedtime.    . NONFORMULARY OR COMPOUNDED ITEM Place 1 drop into the right  eye at bedtime. prednisolone-gatifloxacin-bromfenac by [employer]    . Nutritional Supplements (NUTRITIONAL SUPPLEMENT PO) Take 1 capsule by mouth every other day. In the morning. Jeannetta Nap Five Favorites (CoQ10-Alpha Lipoic Acid-Resveratrol-EGCG)    . Omega-3 Fatty Acids (FISH OIL) 1200 MG CAPS Take 1,290 mg by mouth every other day. In the morning    . Polyethyl Glycol-Propyl Glycol (SYSTANE) 0.4-0.3 % SOLN Place 1-2 drops into both eyes daily.     No current facility-administered medications for this visit.     Allergies  Allergen Reactions  . Avelox [Moxifloxacin Hcl In Nacl] Other (See Comments)    Chills  .  Trazodone And Nefazodone Swelling  . Clarithromycin Other (See Comments)    Unsure of reaction type    Family History  Problem Relation Age of Onset  . Breast cancer Mother 50  . Arthritis Maternal Grandmother   . Diabetes Maternal Grandmother     Social History   Socioeconomic History  . Marital status: Single    Spouse name: Not on file  . Number of children: Not on file  . Years of education: Not on file  . Highest education level: Not on file  Occupational History  . Not on file  Social Needs  . Financial resource strain: Not on file  . Food insecurity:    Worry: Not on file    Inability: Not on file  . Transportation needs:    Medical: Not on file    Non-medical: Not on file  Tobacco Use  . Smoking status: Former Smoker    Packs/day: 0.50    Years: 7.00    Pack years: 3.50  . Smokeless tobacco: Never Used  . Tobacco comment: quit over 40 years ago  Substance and Sexual Activity  . Alcohol use: Yes    Comment: rare--liquor  . Drug use: No  . Sexual activity: Not Currently  Lifestyle  . Physical activity:    Days per week: Not on file    Minutes per session: Not on file  . Stress: Not on file  Relationships  . Social connections:    Talks on phone: Not on file    Gets together: Not on file    Attends religious service: Not on file    Active member of club or organization: Not on file    Attends meetings of clubs or organizations: Not on file    Relationship status: Not on file  . Intimate partner violence:    Fear of current or ex partner: Not on file    Emotionally abused: Not on file    Physically abused: Not on file    Forced sexual activity: Not on file  Other Topics Concern  . Not on file  Social History Narrative  . Not on file    Hospitiliaztions: None  Health Maintenance:    Flu: 09/2017  Tetanus: 12/2011  Pneumovax: 01/2012  Prevnar: 02/2016  Zostavax: 12/2011  Mammogram: 04/2018  Pap Smear: non longer screening  Bone Density:  04/2017  Colon Screening: 02/2013, 3 years  Eye Doctor: annually  Dental Exam: biannually   Providers:   PCP: Webb Silversmith, NP-C  Neurologist: Dr. Melrose Nakayama  Cardiologist: Dr. Rockey Situ  Pulmonologist: Dr. Mortimer Fries   I have personally reviewed and have noted:  1. The patient's medical and social history 2. Their use of alcohol, tobacco or illicit drugs 3. Their current medications and supplements 4. The patient's functional ability including ADL's, fall risks, home safety risks and  hearing or visual  impairment. 5. Diet and physical activities 6. Evidence for depression or mood disorder  Subjective:   Review of Systems:   Constitutional: Denies fever, malaise, fatigue, headache or abrupt weight changes.  HEENT: Denies eye pain, eye redness, ear pain, ringing in the ears, wax buildup, runny nose, nasal congestion, bloody nose, or sore throat. Respiratory: Denies difficulty breathing, shortness of breath, cough or sputum production.   Cardiovascular: Denies chest pain, chest tightness, palpitations or swelling in the hands or feet.  Gastrointestinal: Pt reports intermittent reflux. Denies abdominal pain, bloating, constipation, diarrhea or blood in the stool.  GU: Pt reports vaginal discharge. Denies urgency, frequency, pain with urination, burning sensation, blood in urine, odor or discharge. Musculoskeletal: Pt reports low back pain. Denies decrease in range of motion, difficulty with gait, muscle pain or joint swelling.  Skin: Denies redness, rashes, lesions or ulcercations.  Neurological: Denies dizziness, difficulty with memory, difficulty with speech or problems with balance and coordination.  Psych: Denies anxiety, depression, SI/HI.  No other specific complaints in a complete review of systems (except as listed in HPI above).  Objective:  PE:   BP 132/80   Pulse 63   Temp 98.8 F (37.1 C) (Oral)   Ht 5\' 1"  (1.549 m)   Wt 190 lb (86.2 kg)   SpO2 98%   BMI 35.90 kg/m   Wt  Readings from Last 3 Encounters:  03/23/18 192 lb (87.1 kg)  12/30/17 189 lb (85.7 kg)  12/11/17 190 lb (86.2 kg)    General: Appears her stated age, obese in NAD. Skin: Warm, dry and intact.  HEENT: Head: normal shape and size; Eyes: sclera white, no icterus, conjunctiva pink, PERRLA and EOMs intact; Ears: Tm's gray and intact, normal light reflex; Throat/Mouth: Teeth present, mucosa pink and moist, no exudate, lesions or ulcerations noted.  Neck: Neck supple, trachea midline. No masses, lumps or thyromegaly present.  Cardiovascular: Normal rate and rhythm. S1,S2 noted.  No murmur, rubs or gallops noted. No JVD or BLE edema. No carotid bruits noted. Pulmonary/Chest: Normal effort and positive vesicular breath sounds. No respiratory distress. No wheezes, rales or ronchi noted.  Abdomen: Soft and nontender. Normal bowel sounds. No distention or masses noted. Liver, spleen and kidneys non palpable. Musculoskeletal: Decreased flexion, extension and rotation of the lumbar spine secondary to pain. No bony tenderness noted over the spine.  Strength 5/5 BUE/BLE. No difficulty with gait.  Neurological: Alert and oriented. Cranial nerves II-XII grossly intact. Coordination normal.  Psychiatric: Mood and affect normal. Behavior is normal. Judgment and thought content normal.     BMET    Component Value Date/Time   NA 141 04/11/2017 1352   NA 140 02/21/2015 1236   K 3.7 04/11/2017 1352   CL 105 04/11/2017 1352   CO2 28 04/11/2017 1352   GLUCOSE 89 04/11/2017 1352   BUN 19 04/11/2017 1352   BUN 14 02/21/2015 1236   CREATININE 0.94 04/11/2017 1352   CALCIUM 9.3 04/11/2017 1352   GFRNONAA 59 (L) 04/11/2017 1352   GFRAA >60 04/11/2017 1352    Lipid Panel     Component Value Date/Time   CHOL 176 02/20/2017 1416   TRIG 74.0 02/20/2017 1416   HDL 52.30 02/20/2017 1416   CHOLHDL 3 02/20/2017 1416   VLDL 14.8 02/20/2017 1416   LDLCALC 109 (H) 02/20/2017 1416    CBC    Component Value  Date/Time   WBC 10.1 04/11/2017 1352   RBC 4.15 04/11/2017 1352   HGB 13.7 04/11/2017 1352  HCT 38.3 04/11/2017 1352   PLT 212 04/11/2017 1352   MCV 92.3 04/11/2017 1352   MCH 33.0 04/11/2017 1352   MCHC 35.8 04/11/2017 1352   RDW 13.0 04/11/2017 1352    Hgb A1C Lab Results  Component Value Date   HGBA1C 5.1 02/20/2017      Assessment and Plan:   Medicare Annual Wellness Visit:  Diet: She does eat some meat. She consumes some fruits and veggies daily. She rarely eats fried foods. She drinks mostly hot tea and water. Physical activity: Sedentary Depression/mood screen: Negative Hearing: Intact to whispered voice Visual acuity: Grossly normal, performs annual eye exam  ADLs: Capable Fall risk: None Home safety: Good Cognitive evaluation: Intact to orientation, naming, recall and repetition EOL planning: Adv directives, full code/ I agree  Preventative Medicine: Encouraged her to get a flu shot in the fall. Tetanus, pneumovax, prevnar and zostovax UTD. She declines Shingrix. Mammogram UTD. She is no longer screening for cervical cancer. Bone density UTD. Referral placed to GI for screening colonoscopy. Encouraged her to consume a balanced diet and exercise regimen. Advised her to see an eye doctor and dentist annually. Will check CBC, CMET, Lipid and Vit D today.   Next appointment: 1 year, Medicare Wellness Exam   Webb Silversmith, NP

## 2018-05-29 NOTE — Patient Instructions (Signed)
Health Maintenance for Postmenopausal Women Menopause is a normal process in which your reproductive ability comes to an end. This process happens gradually over a span of months to years, usually between the ages of 22 and 9. Menopause is complete when you have missed 12 consecutive menstrual periods. It is important to talk with your health care provider about some of the most common conditions that affect postmenopausal women, such as heart disease, cancer, and bone loss (osteoporosis). Adopting a healthy lifestyle and getting preventive care can help to promote your health and wellness. Those actions can also lower your chances of developing some of these common conditions. What should I know about menopause? During menopause, you may experience a number of symptoms, such as:  Moderate-to-severe hot flashes.  Night sweats.  Decrease in sex drive.  Mood swings.  Headaches.  Tiredness.  Irritability.  Memory problems.  Insomnia.  Choosing to treat or not to treat menopausal changes is an individual decision that you make with your health care provider. What should I know about hormone replacement therapy and supplements? Hormone therapy products are effective for treating symptoms that are associated with menopause, such as hot flashes and night sweats. Hormone replacement carries certain risks, especially as you become older. If you are thinking about using estrogen or estrogen with progestin treatments, discuss the benefits and risks with your health care provider. What should I know about heart disease and stroke? Heart disease, heart attack, and stroke become more likely as you age. This may be due, in part, to the hormonal changes that your body experiences during menopause. These can affect how your body processes dietary fats, triglycerides, and cholesterol. Heart attack and stroke are both medical emergencies. There are many things that you can do to help prevent heart disease  and stroke:  Have your blood pressure checked at least every 1-2 years. High blood pressure causes heart disease and increases the risk of stroke.  If you are 53-22 years old, ask your health care provider if you should take aspirin to prevent a heart attack or a stroke.  Do not use any tobacco products, including cigarettes, chewing tobacco, or electronic cigarettes. If you need help quitting, ask your health care provider.  It is important to eat a healthy diet and maintain a healthy weight. ? Be sure to include plenty of vegetables, fruits, low-fat dairy products, and lean protein. ? Avoid eating foods that are high in solid fats, added sugars, or salt (sodium).  Get regular exercise. This is one of the most important things that you can do for your health. ? Try to exercise for at least 150 minutes each week. The type of exercise that you do should increase your heart rate and make you sweat. This is known as moderate-intensity exercise. ? Try to do strengthening exercises at least twice each week. Do these in addition to the moderate-intensity exercise.  Know your numbers.Ask your health care provider to check your cholesterol and your blood glucose. Continue to have your blood tested as directed by your health care provider.  What should I know about cancer screening? There are several types of cancer. Take the following steps to reduce your risk and to catch any cancer development as early as possible. Breast Cancer  Practice breast self-awareness. ? This means understanding how your breasts normally appear and feel. ? It also means doing regular breast self-exams. Let your health care provider know about any changes, no matter how small.  If you are 40  or older, have a clinician do a breast exam (clinical breast exam or CBE) every year. Depending on your age, family history, and medical history, it may be recommended that you also have a yearly breast X-ray (mammogram).  If you  have a family history of breast cancer, talk with your health care provider about genetic screening.  If you are at high risk for breast cancer, talk with your health care provider about having an MRI and a mammogram every year.  Breast cancer (BRCA) gene test is recommended for women who have family members with BRCA-related cancers. Results of the assessment will determine the need for genetic counseling and BRCA1 and for BRCA2 testing. BRCA-related cancers include these types: ? Breast. This occurs in males or females. ? Ovarian. ? Tubal. This may also be called fallopian tube cancer. ? Cancer of the abdominal or pelvic lining (peritoneal cancer). ? Prostate. ? Pancreatic.  Cervical, Uterine, and Ovarian Cancer Your health care provider may recommend that you be screened regularly for cancer of the pelvic organs. These include your ovaries, uterus, and vagina. This screening involves a pelvic exam, which includes checking for microscopic changes to the surface of your cervix (Pap test).  For women ages 21-65, health care providers may recommend a pelvic exam and a Pap test every three years. For women ages 79-65, they may recommend the Pap test and pelvic exam, combined with testing for human papilloma virus (HPV), every five years. Some types of HPV increase your risk of cervical cancer. Testing for HPV may also be done on women of any age who have unclear Pap test results.  Other health care providers may not recommend any screening for nonpregnant women who are considered low risk for pelvic cancer and have no symptoms. Ask your health care provider if a screening pelvic exam is right for you.  If you have had past treatment for cervical cancer or a condition that could lead to cancer, you need Pap tests and screening for cancer for at least 20 years after your treatment. If Pap tests have been discontinued for you, your risk factors (such as having a new sexual partner) need to be  reassessed to determine if you should start having screenings again. Some women have medical problems that increase the chance of getting cervical cancer. In these cases, your health care provider may recommend that you have screening and Pap tests more often.  If you have a family history of uterine cancer or ovarian cancer, talk with your health care provider about genetic screening.  If you have vaginal bleeding after reaching menopause, tell your health care provider.  There are currently no reliable tests available to screen for ovarian cancer.  Lung Cancer Lung cancer screening is recommended for adults 69-62 years old who are at high risk for lung cancer because of a history of smoking. A yearly low-dose CT scan of the lungs is recommended if you:  Currently smoke.  Have a history of at least 30 pack-years of smoking and you currently smoke or have quit within the past 15 years. A pack-year is smoking an average of one pack of cigarettes per day for one year.  Yearly screening should:  Continue until it has been 15 years since you quit.  Stop if you develop a health problem that would prevent you from having lung cancer treatment.  Colorectal Cancer  This type of cancer can be detected and can often be prevented.  Routine colorectal cancer screening usually begins at  age 42 and continues through age 45.  If you have risk factors for colon cancer, your health care provider may recommend that you be screened at an earlier age.  If you have a family history of colorectal cancer, talk with your health care provider about genetic screening.  Your health care provider may also recommend using home test kits to check for hidden blood in your stool.  A small camera at the end of a tube can be used to examine your colon directly (sigmoidoscopy or colonoscopy). This is done to check for the earliest forms of colorectal cancer.  Direct examination of the colon should be repeated every  5-10 years until age 71. However, if early forms of precancerous polyps or small growths are found or if you have a family history or genetic risk for colorectal cancer, you may need to be screened more often.  Skin Cancer  Check your skin from head to toe regularly.  Monitor any moles. Be sure to tell your health care provider: ? About any new moles or changes in moles, especially if there is a change in a mole's shape or color. ? If you have a mole that is larger than the size of a pencil eraser.  If any of your family members has a history of skin cancer, especially at a young age, talk with your health care provider about genetic screening.  Always use sunscreen. Apply sunscreen liberally and repeatedly throughout the day.  Whenever you are outside, protect yourself by wearing long sleeves, pants, a wide-brimmed hat, and sunglasses.  What should I know about osteoporosis? Osteoporosis is a condition in which bone destruction happens more quickly than new bone creation. After menopause, you may be at an increased risk for osteoporosis. To help prevent osteoporosis or the bone fractures that can happen because of osteoporosis, the following is recommended:  If you are 46-71 years old, get at least 1,000 mg of calcium and at least 600 mg of vitamin D per day.  If you are older than age 55 but younger than age 65, get at least 1,200 mg of calcium and at least 600 mg of vitamin D per day.  If you are older than age 54, get at least 1,200 mg of calcium and at least 800 mg of vitamin D per day.  Smoking and excessive alcohol intake increase the risk of osteoporosis. Eat foods that are rich in calcium and vitamin D, and do weight-bearing exercises several times each week as directed by your health care provider. What should I know about how menopause affects my mental health? Depression may occur at any age, but it is more common as you become older. Common symptoms of depression  include:  Low or sad mood.  Changes in sleep patterns.  Changes in appetite or eating patterns.  Feeling an overall lack of motivation or enjoyment of activities that you previously enjoyed.  Frequent crying spells.  Talk with your health care provider if you think that you are experiencing depression. What should I know about immunizations? It is important that you get and maintain your immunizations. These include:  Tetanus, diphtheria, and pertussis (Tdap) booster vaccine.  Influenza every year before the flu season begins.  Pneumonia vaccine.  Shingles vaccine.  Your health care provider may also recommend other immunizations. This information is not intended to replace advice given to you by your health care provider. Make sure you discuss any questions you have with your health care provider. Document Released: 01/24/2006  Document Revised: 06/21/2016 Document Reviewed: 09/05/2015 Elsevier Interactive Patient Education  2018 [employer]  

## 2018-05-29 NOTE — Assessment & Plan Note (Signed)
Encouraged CPAP use She will follow up with pulmonology

## 2018-05-29 NOTE — Assessment & Plan Note (Signed)
Deteriorated Will trial Celebrex, eRx sent to pharmacy Referral placed to PT

## 2018-05-30 LAB — CBC
HCT: 37.1 % (ref 35.0–45.0)
HEMOGLOBIN: 13.3 g/dL (ref 11.7–15.5)
MCH: 33.3 pg — ABNORMAL HIGH (ref 27.0–33.0)
MCHC: 35.8 g/dL (ref 32.0–36.0)
MCV: 92.8 fL (ref 80.0–100.0)
MPV: 11.5 fL (ref 7.5–12.5)
Platelets: 209 10*3/uL (ref 140–400)
RBC: 4 10*6/uL (ref 3.80–5.10)
RDW: 12.5 % (ref 11.0–15.0)
WBC: 7 10*3/uL (ref 3.8–10.8)

## 2018-05-30 LAB — COMPREHENSIVE METABOLIC PANEL
AG RATIO: 1.5 (calc) (ref 1.0–2.5)
ALBUMIN MSPROF: 4.1 g/dL (ref 3.6–5.1)
ALT: 15 U/L (ref 6–29)
AST: 14 U/L (ref 10–35)
Alkaline phosphatase (APISO): 75 U/L (ref 33–130)
BUN / CREAT RATIO: 20 (calc) (ref 6–22)
BUN: 20 mg/dL (ref 7–25)
CHLORIDE: 106 mmol/L (ref 98–110)
CO2: 28 mmol/L (ref 20–32)
Calcium: 9.7 mg/dL (ref 8.6–10.4)
Creat: 0.99 mg/dL — ABNORMAL HIGH (ref 0.60–0.93)
GLOBULIN: 2.7 g/dL (ref 1.9–3.7)
GLUCOSE: 108 mg/dL — AB (ref 65–99)
Potassium: 3.8 mmol/L (ref 3.5–5.3)
SODIUM: 142 mmol/L (ref 135–146)
TOTAL PROTEIN: 6.8 g/dL (ref 6.1–8.1)
Total Bilirubin: 0.7 mg/dL (ref 0.2–1.2)

## 2018-05-30 LAB — WET PREP BY MOLECULAR PROBE
Candida species: NOT DETECTED
GARDNERELLA VAGINALIS: NOT DETECTED
MICRO NUMBER: 90716130
SPECIMEN QUALITY: ADEQUATE
Trichomonas vaginosis: NOT DETECTED

## 2018-05-30 LAB — LIPID PANEL
Cholesterol: 183 mg/dL (ref <200)
HDL: 48 mg/dL — ABNORMAL LOW (ref >50)
LDL Cholesterol (Calc): 110 mg/dL (calc) — ABNORMAL HIGH
Non-HDL Cholesterol (Calc): 135 mg/dL (calc) — ABNORMAL HIGH (ref <130)
TRIGLYCERIDES: 140 mg/dL (ref <150)
Total CHOL/HDL Ratio: 3.8 (calc) (ref <5.0)

## 2018-05-30 LAB — VITAMIN D 25 HYDROXY (VIT D DEFICIENCY, FRACTURES): Vit D, 25-Hydroxy: 37 ng/mL (ref 30–100)

## 2018-06-01 ENCOUNTER — Other Ambulatory Visit: Payer: Self-pay

## 2018-06-01 DIAGNOSIS — Z1211 Encounter for screening for malignant neoplasm of colon: Secondary | ICD-10-CM

## 2018-06-02 ENCOUNTER — Other Ambulatory Visit: Payer: Self-pay

## 2018-06-02 DIAGNOSIS — Z1211 Encounter for screening for malignant neoplasm of colon: Secondary | ICD-10-CM

## 2018-06-03 DIAGNOSIS — G5603 Carpal tunnel syndrome, bilateral upper limbs: Secondary | ICD-10-CM | POA: Diagnosis not present

## 2018-06-04 ENCOUNTER — Other Ambulatory Visit: Payer: Self-pay

## 2018-06-08 DIAGNOSIS — M545 Low back pain: Secondary | ICD-10-CM | POA: Diagnosis not present

## 2018-06-08 DIAGNOSIS — G4733 Obstructive sleep apnea (adult) (pediatric): Secondary | ICD-10-CM | POA: Diagnosis not present

## 2018-06-12 ENCOUNTER — Telehealth: Payer: Self-pay | Admitting: Gastroenterology

## 2018-06-12 DIAGNOSIS — M545 Low back pain: Secondary | ICD-10-CM | POA: Diagnosis not present

## 2018-06-12 NOTE — Telephone Encounter (Signed)
Pt left vm she has to reschedule her procedure 7/2 due to family emergency please call pt

## 2018-06-12 NOTE — Telephone Encounter (Signed)
Patient has requested to cancel colonoscopy due to family emergency.  Trish in Endo has been informed.  Thanks [employer]

## 2018-06-15 ENCOUNTER — Other Ambulatory Visit: Payer: Self-pay

## 2018-06-15 DIAGNOSIS — Z1211 Encounter for screening for malignant neoplasm of colon: Secondary | ICD-10-CM

## 2018-06-16 ENCOUNTER — Encounter: Admission: RE | Payer: Self-pay | Source: Ambulatory Visit

## 2018-06-16 ENCOUNTER — Ambulatory Visit: Admit: 2018-06-16 | Payer: Medicare HMO | Admitting: Gastroenterology

## 2018-06-16 ENCOUNTER — Ambulatory Visit: Admission: RE | Admit: 2018-06-16 | Payer: Medicare HMO | Source: Ambulatory Visit | Admitting: Gastroenterology

## 2018-06-16 SURGERY — COLONOSCOPY WITH PROPOFOL
Anesthesia: General

## 2018-06-19 DIAGNOSIS — M545 Low back pain: Secondary | ICD-10-CM | POA: Diagnosis not present

## 2018-06-29 ENCOUNTER — Encounter: Payer: Self-pay | Admitting: *Deleted

## 2018-06-30 ENCOUNTER — Ambulatory Visit: Payer: Medicare HMO | Admitting: Registered Nurse

## 2018-06-30 ENCOUNTER — Ambulatory Visit
Admission: RE | Admit: 2018-06-30 | Discharge: 2018-06-30 | Disposition: A | Discharge status: Home / Self Care | Payer: Medicare HMO | Source: Ambulatory Visit | Attending: Gastroenterology | Admitting: Gastroenterology

## 2018-06-30 ENCOUNTER — Encounter: Payer: Self-pay | Admitting: Anesthesiology

## 2018-06-30 ENCOUNTER — Encounter
Admission: RE | Disposition: A | Discharge status: Home / Self Care | Payer: Self-pay | Source: Ambulatory Visit | Attending: Gastroenterology

## 2018-06-30 DIAGNOSIS — D120 Benign neoplasm of cecum: Secondary | ICD-10-CM | POA: Diagnosis not present

## 2018-06-30 DIAGNOSIS — J4540 Moderate persistent asthma, uncomplicated: Secondary | ICD-10-CM | POA: Diagnosis not present

## 2018-06-30 DIAGNOSIS — D125 Benign neoplasm of sigmoid colon: Secondary | ICD-10-CM | POA: Diagnosis not present

## 2018-06-30 DIAGNOSIS — D123 Benign neoplasm of transverse colon: Secondary | ICD-10-CM | POA: Insufficient documentation

## 2018-06-30 DIAGNOSIS — I10 Essential (primary) hypertension: Secondary | ICD-10-CM | POA: Insufficient documentation

## 2018-06-30 DIAGNOSIS — Z7951 Long term (current) use of inhaled steroids: Secondary | ICD-10-CM | POA: Diagnosis not present

## 2018-06-30 DIAGNOSIS — E119 Type 2 diabetes mellitus without complications: Secondary | ICD-10-CM | POA: Diagnosis not present

## 2018-06-30 DIAGNOSIS — G4730 Sleep apnea, unspecified: Secondary | ICD-10-CM | POA: Insufficient documentation

## 2018-06-30 DIAGNOSIS — D122 Benign neoplasm of ascending colon: Secondary | ICD-10-CM | POA: Diagnosis not present

## 2018-06-30 DIAGNOSIS — Z86010 Personal history of colonic polyps: Secondary | ICD-10-CM | POA: Diagnosis not present

## 2018-06-30 DIAGNOSIS — D124 Benign neoplasm of descending colon: Secondary | ICD-10-CM | POA: Diagnosis not present

## 2018-06-30 DIAGNOSIS — Z791 Long term (current) use of non-steroidal anti-inflammatories (NSAID): Secondary | ICD-10-CM | POA: Diagnosis not present

## 2018-06-30 DIAGNOSIS — G4733 Obstructive sleep apnea (adult) (pediatric): Secondary | ICD-10-CM | POA: Diagnosis not present

## 2018-06-30 DIAGNOSIS — Z1211 Encounter for screening for malignant neoplasm of colon: Secondary | ICD-10-CM | POA: Diagnosis not present

## 2018-06-30 DIAGNOSIS — K5790 Diverticulosis of intestine, part unspecified, without perforation or abscess without bleeding: Secondary | ICD-10-CM | POA: Insufficient documentation

## 2018-06-30 DIAGNOSIS — E785 Hyperlipidemia, unspecified: Secondary | ICD-10-CM | POA: Insufficient documentation

## 2018-06-30 DIAGNOSIS — Z87891 Personal history of nicotine dependence: Secondary | ICD-10-CM | POA: Insufficient documentation

## 2018-06-30 DIAGNOSIS — J45909 Unspecified asthma, uncomplicated: Secondary | ICD-10-CM | POA: Diagnosis not present

## 2018-06-30 DIAGNOSIS — K635 Polyp of colon: Secondary | ICD-10-CM | POA: Insufficient documentation

## 2018-06-30 HISTORY — PX: COLONOSCOPY WITH PROPOFOL: SHX5780

## 2018-06-30 SURGERY — COLONOSCOPY WITH PROPOFOL
Anesthesia: General

## 2018-06-30 MED ORDER — SODIUM CHLORIDE 0.9 % IV SOLN
INTRAVENOUS | Status: DC
  Administered 2018-06-30: 1000 mL via INTRAVENOUS

## 2018-06-30 MED ORDER — PROPOFOL 10 MG/ML IV BOLUS
INTRAVENOUS | Status: DC | PRN
  Administered 2018-06-30: 70 mg via INTRAVENOUS

## 2018-06-30 MED ORDER — PROPOFOL 500 MG/50ML IV EMUL
INTRAVENOUS | Status: DC | PRN
  Administered 2018-06-30: 140 ug/kg/min via INTRAVENOUS

## 2018-06-30 MED ORDER — LIDOCAINE HCL (PF) 1 % IJ SOLN
2.0000 mL | Freq: Once | INTRAMUSCULAR | Status: AC
  Administered 2018-06-30: 0.3 mL via INTRADERMAL

## 2018-06-30 MED ORDER — LIDOCAINE HCL (PF) 1 % IJ SOLN
INTRAMUSCULAR | Status: AC
  Administered 2018-06-30: 0.3 mL via INTRADERMAL
  Filled 2018-06-30: qty 2

## 2018-06-30 NOTE — Op Note (Signed)
[facility] Gastroenterology Patient Name: Molly Faulkner Procedure Date: 06/30/2018 9:29 AM MRN: [id number] Account #: [id number] Date of Birth: [birth year] Admit Type: Outpatient Age: 77 Room: [facility] ENDO ROOM 1 Gender: Female Note Status: Finalized Procedure:            Colonoscopy Indications:          High risk colon cancer surveillance: Personal history                        of colonic polyps Providers:            Jonathon Bellows MD, MD Referring MD:         Jearld Fenton (Referring MD) Medicines:            Monitored Anesthesia Care Complications:        No immediate complications. Procedure:            Pre-Anesthesia Assessment:                       - Prior to the procedure, a History and Physical was                        performed, and patient medications, allergies and                        sensitivities were reviewed. The patient's tolerance of                        previous anesthesia was reviewed.                       - The risks and benefits of the procedure and the                        sedation options and risks were discussed with the                        patient. All questions were answered and informed                        consent was obtained.                       - ASA Grade Assessment: III - A patient with severe                        systemic disease.                       After obtaining informed consent, the colonoscope was                        passed under direct vision. Throughout the procedure,                        the patient's blood pressure, pulse, and oxygen                        saturations were monitored continuously. The                        Colonoscope  was introduced through the anus and                        advanced to the the cecum, identified by the                        appendiceal orifice, IC valve and transillumination.                        The colonoscopy was performed with ease. The patient                         tolerated the procedure well. The quality of the bowel                        preparation was good. Findings:      The perianal and digital rectal examinations were normal.      A few small-mouthed diverticula were found in the entire colon.      Three sessile polyps were found in the sigmoid colon, transverse colon       and cecum. The polyps were 3 to 4 mm in size. These polyps were removed       with a cold biopsy forceps. Resection and retrieval were complete.      Three sessile polyps were found in the ascending colon. The polyps were       3 to 4 mm in size. These polyps were removed with a cold biopsy forceps.       Resection and retrieval were complete.      Two sessile polyps were found in the descending colon. The polyps were 3       to 4 mm in size. These polyps were removed with a cold biopsy forceps.       Resection and retrieval were complete.      A 5 mm polyp was found in the sigmoid colon. The polyp was sessile. The       polyp was removed with a cold snare. Resection and retrieval were       complete.      The exam was otherwise without abnormality on direct and retroflexion       views. Impression:           - Diverticulosis in the entire examined colon.                       - Three 3 to 4 mm polyps in the sigmoid colon, in the                        transverse colon and in the cecum, removed with a cold                        biopsy forceps. Resected and retrieved.                       - Three 3 to 4 mm polyps in the ascending colon,                        removed with a cold biopsy forceps. Resected and  retrieved.                       - Two 3 to 4 mm polyps in the descending colon, removed                        with a cold biopsy forceps. Resected and retrieved.                       - One 5 mm polyp in the sigmoid colon, removed with a                        cold snare. Resected and retrieved.                       - The  examination was otherwise normal on direct and                        retroflexion views. Recommendation:       - Discharge patient to home.                       - Resume previous diet.                       - Continue present medications.                       - Await pathology results.                       - Repeat colonoscopy in 3 - 5 years for surveillance                        based on pathology results.                       - Return to GI office PRN. Procedure Code(s):    --- Professional ---                       [phone], Colonoscopy, flexible; with removal of tumor(s),                        polyp(s), or other lesion(s) by snare technique                       45380, 67, Colonoscopy, flexible; with biopsy, single                        or multiple Diagnosis Code(s):    --- Professional ---                       Z86.010, Personal history of colonic polyps                       D12.5, Benign neoplasm of sigmoid colon                       D12.2, Benign neoplasm of ascending colon                       D12.4, Benign neoplasm  of descending colon                       D12.0, Benign neoplasm of cecum                       D12.3, Benign neoplasm of transverse colon (hepatic                        flexure or splenic flexure)                       K57.30, Diverticulosis of large intestine without                        perforation or abscess without bleeding CPT copyright 2017 American Medical [employer]. All rights reserved. The codes documented in this report are preliminary and upon coder review may  be revised to meet current compliance requirements. Jonathon Bellows, MD Jonathon Bellows MD, MD 06/30/2018 10:13:42 AM This report has been signed electronically. Number of Addenda: 0 Note Initiated On: 06/30/2018 9:29 AM Scope Withdrawal Time: 0 hours 20 minutes 31 seconds  Total Procedure Duration: 0 hours 26 minutes 59 seconds       [facility]

## 2018-06-30 NOTE — Anesthesia Preprocedure Evaluation (Signed)
Anesthesia Evaluation  Patient identified by MRN, date of birth, ID band Patient awake    Reviewed: Allergy & Precautions, H&P , NPO status , Patient's Chart, lab work & pertinent test results, reviewed documented beta blocker date and time   History of Anesthesia Complications Negative for: history of anesthetic complications  Airway Mallampati: II  TM Distance: >3 FB Neck ROM: full    Dental  (+) Teeth Intact, Dental Advidsory Given, Caps, Partial Upper   Pulmonary neg pulmonary ROS, neg shortness of breath, asthma , sleep apnea , neg recent URI, former smoker,    Pulmonary exam normal breath sounds clear to auscultation       Cardiovascular Exercise Tolerance: Good hypertension, (-) angina(-) CAD, (-) Past MI, (-) Cardiac Stents and (-) CABG negative cardio ROS  + dysrhythmias (-) Valvular Problems/Murmurs Rhythm:regular Rate:Normal     Neuro/Psych PSYCHIATRIC DISORDERS Depression negative neurological ROS     GI/Hepatic negative GI ROS, Neg liver ROS, GERD  ,  Endo/Other  negative endocrine ROSdiabetes  Renal/GU      Musculoskeletal   Abdominal   Peds  Hematology negative hematology ROS (+)   Anesthesia Other Findings Past Medical History: No date: Allergy No date: Arthritis No date: Asthma 12/2017: Carpal tunnel syndrome     Comment:  left, referred to neurology for EMG testing 12/2017: Cataract of left eye No date: Chicken pox No date: Depression No date: Diverticulitis No date: Diverticulosis No date: Dysrhythmia     Comment:  paroxysmal tachycardia No date: GERD (gastroesophageal reflux disease) No date: Hx of colonic polyps No date: Hyperlipidemia No date: Hypertension No date: Insomnia 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 No date: Sleep apnea     Comment:  H/O   NO LONGER No date: Syncope   Reproductive/Obstetrics negative OB ROS                             Anesthesia  Physical  Anesthesia Plan  ASA: III  Anesthesia Plan: General   Post-op Pain Management:    Induction:   PONV Risk Score and Plan: 3 and Propofol infusion and TIVA  Airway Management Planned: Natural Airway and Nasal Cannula  Additional Equipment:   Intra-op Plan:   Post-operative Plan:   Informed Consent: I have reviewed the patients History and Physical, chart, labs and discussed the procedure including the risks, benefits and alternatives for the proposed anesthesia with the patient or authorized representative who has indicated his/her understanding and acceptance.     Plan Discussed with: CRNA  Anesthesia Plan Comments:         Anesthesia Quick Evaluation

## 2018-06-30 NOTE — Anesthesia Post-op Follow-up Note (Signed)
Anesthesia QCDR form completed.        

## 2018-06-30 NOTE — H&P (Signed)
Jonathon Bellows, MD [address] [place], [place], [place], [address] [address], [place], [place], [place], [address] Phone: [phone]  Fax: [phone]  Primary Care Physician:  Jearld Fenton, NP   Pre-Procedure History & Physical: HPI:  Molly Faulkner is a 77 y.o. female is here for an colonoscopy.   Past Medical History:  Diagnosis Date  . Allergy   . Arthritis   . Asthma   . Carpal tunnel syndrome 12/2017   left, referred to neurology for EMG testing  . 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/2017  . Chicken pox   . Depression   . Diverticulitis   . Diverticulosis   . Dysrhythmia    paroxysmal tachycardia  . GERD (gastroesophageal reflux disease)   . Hx of colonic polyps   . Hyperlipidemia   . Hypertension   . Insomnia   . 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 Sleep apnea    H/O   NO LONGER  . Syncope     Past Surgical History:  Procedure Laterality Date  . ABDOMINAL HYSTERECTOMY  1994   total  . BREAST BIOPSY Bilateral [id number]  . CARPAL TUNNEL RELEASE Bilateral   . CATARACT EXTRACTION W/PHACO Right 12/02/2017   Procedure: CATARACT EXTRACTION PHACO AND INTRAOCULAR LENS PLACEMENT (IOC);  Surgeon: Birder Robson, MD;  Location: [facility] ORS;  Service: Ophthalmology;  Laterality: Right;  [place] 00:36 AP% 11.5 CDE 4.21 Fluid pack lot # [id number]  . CATARACT EXTRACTION W/PHACO Left 12/30/2017   Procedure: CATARACT EXTRACTION PHACO AND INTRAOCULAR LENS PLACEMENT ([place]);  Surgeon: Birder Robson, MD;  Location: [facility] ORS;  Service: Ophthalmology;  Laterality: Left;  [place] 00:58 AP% 11.6 CDE 6.80 Fluid pack lot # [id number]  . DILATION AND CURETTAGE OF UTERUS     X 2  . KNEE ARTHROSCOPY      Prior to Admission medications   Medication Sig Start Date End Date Taking? Authorizing Provider  ADVAIR HFA 115-21 MCG/ACT inhaler INHALE 1 PUFF BY MOUTH TWICE DAILY 05/05/18   Jearld Fenton, NP  albuterol (PROVENTIL HFA;VENTOLIN HFA) 108 (90 Base) MCG/ACT inhaler Inhale 2 puffs into  the lungs every 6 (six) hours as needed for wheezing or shortness of breath. 04/11/17   Earleen Newport, MD  ARNICA EX Apply 1 application topically 4 (four) times daily as needed (for arthritis pain.).    [provider]  atenolol (TENORMIN) 50 MG tablet TAKE 1 AND 1/2 TABLETS DAILY. 05/27/18   Jearld Fenton, NP  celecoxib (CELEBREX) 50 MG capsule Take 1 capsule (50 mg total) by mouth 2 (two) times daily. 05/29/18   Jearld Fenton, NP  cholecalciferol (VITAMIN D) 1000 units tablet Take 1,000 Units by mouth every other day. At bedtime.    [provider]  CREAM BASE EX Apply 1-2 application topically 4 (four) times daily as needed (for arthritis pain.). Penetrex Inflammation Formula (Arnica/Pyridoxine/MSM/Boswellia/Cetyl Myristoleate)    [provider]  hydrochlorothiazide (HYDRODIURIL) 25 MG tablet TAKE 1 TABLET EVERY DAY 05/27/18   Jearld Fenton, NP  ibuprofen (ADVIL,MOTRIN) 200 MG tablet Take 200 mg by mouth at bedtime as needed (for arthritis pain.).    [provider]  Liniments (ORTHOGEL EX) Apply 1-2 application topically 4 (four) times daily as needed (for arthritis pain relief.).    [provider]  losartan (COZAAR) 50 MG tablet TAKE 1 TABLET TWICE DAILY 09/01/17   Minna Merritts, MD  Magnesium Chloride POWD Apply 1 application topically  4 (four) times daily as needed (for arthritis pain.). Magnesium Oil with Arnica    [provider]  Menthol-Methyl Salicylate (PAIN RELIEVING) CREA Apply 1 application topically 4 (four) times daily as needed (for arthritis pain). [facility] PAIN RELIEVING CREAM    [provider]  mirtazapine (REMERON SOL-TAB) 15 MG disintegrating tablet Take 1 tablet (15 mg total) by mouth at bedtime. 05/29/18   Jearld Fenton, NP  Misc Natural Products (LEG VEIN & CIRCULATION PO) Take 1 capsule by mouth every other day. In the morning. Jeannetta Nap Circulation and Vein Support    [provider]    Multiple Vitamins-Minerals (EYE SUPPORT PO) Take 1 tablet by mouth at bedtime.    [provider]  NONFORMULARY OR COMPOUNDED ITEM Place 1 drop into the right eye at bedtime. prednisolone-gatifloxacin-bromfenac by [employer]    [provider]  Nutritional Supplements (NUTRITIONAL SUPPLEMENT PO) Take 1 capsule by mouth every other day. In the morning. Jeannetta Nap Five Favorites (CoQ10-Alpha Lipoic Acid-Resveratrol-EGCG)    [provider]  Omega-3 Fatty Acids (FISH OIL) 1200 MG CAPS Take 1,290 mg by mouth every other day. In the morning    [provider]  Polyethyl Glycol-Propyl Glycol (SYSTANE) 0.4-0.3 % SOLN Place 1-2 drops into both eyes daily.    [provider]    Allergies as of 06/15/2018 - Review Complete 05/29/2018  Allergen Reaction Noted  . Avelox [moxifloxacin hcl in nacl] Other (See Comments) 07/25/2014  . Trazodone and nefazodone Swelling 07/25/2014  . Clarithromycin Other (See Comments) 09/18/2015    Family History  Problem Relation Age of Onset  . Breast cancer Mother 27  . Arthritis Maternal Grandmother   . Diabetes Maternal Grandmother     Social History   Socioeconomic History  . Marital status: Single    Spouse name: Not on file  . Number of children: Not on file  . Years of education: Not on file  . Highest education level: Not on file  Occupational History  . Not on file  Social Needs  . Financial resource strain: Not on file  . Food insecurity:    Worry: Not on file    Inability: Not on file  . Transportation needs:    Medical: Not on file    Non-medical: Not on file  Tobacco Use  . Smoking status: Former Smoker    Packs/day: 0.50    Years: 7.00    Pack years: 3.50  . Smokeless tobacco: Never Used  . Tobacco comment: quit over 40 years ago  Substance and Sexual Activity  . Alcohol use: Yes    Comment: rare--liquor  . Drug use: No  . Sexual activity: Not Currently  Lifestyle  . Physical  activity:    Days per week: Not on file    Minutes per session: Not on file  . Stress: Not on file  Relationships  . Social connections:    Talks on phone: Not on file    Gets together: Not on file    Attends religious service: Not on file    Active member of club or organization: Not on file    Attends meetings of clubs or organizations: Not on file    Relationship status: Not on file  . Intimate partner violence:    Fear of current or ex partner: Not on file    Emotionally abused: Not on file    Physically abused: Not on file    Forced sexual activity: Not on file  Other Topics Concern  . Not on file  Social History Narrative  . Not on file    Review of Systems: See HPI, otherwise negative ROS  Physical Exam: BP 135/79   Pulse (!) 59   Temp (!) 96.2 F (35.7 C) (Tympanic)   Resp 17   Ht 5\' 1"  (1.549 m)   Wt 190 lb (86.2 kg)   SpO2 98%   BMI 35.90 kg/m  General:   Alert,  pleasant and cooperative in NAD Head:  Normocephalic and atraumatic. Neck:  Supple; no masses or thyromegaly. Lungs:  Clear throughout to auscultation, normal respiratory effort.    Heart:  +S1, +S2, Regular rate and rhythm, No edema. Abdomen:  Soft, nontender and nondistended. Normal bowel sounds, without guarding, and without rebound.   Neurologic:  Alert and  oriented x4;  grossly normal neurologically.  Impression/Plan: Molly Faulkner is here for an colonoscopy to be performed for surveillance due to prior history of colon polyps   Risks, benefits, limitations, and alternatives regarding  colonoscopy have been reviewed with the patient.  Questions have been answered.  All parties agreeable.   Jonathon Bellows, MD  06/30/2018, 9:28 AM

## 2018-06-30 NOTE — Anesthesia Postprocedure Evaluation (Signed)
Anesthesia Post Note  Patient: Molly Faulkner  Procedure(s) Performed: COLONOSCOPY WITH PROPOFOL (N/A )  Patient location during evaluation: Endoscopy Anesthesia Type: General Level of consciousness: awake and alert Pain management: pain level controlled Vital Signs Assessment: post-procedure vital signs reviewed and stable Respiratory status: spontaneous breathing, nonlabored ventilation, respiratory function stable and patient connected to nasal cannula oxygen Cardiovascular status: blood pressure returned to baseline and stable Postop Assessment: no apparent nausea or vomiting Anesthetic complications: no     Last Vitals:  Vitals:   06/30/18 1026 06/30/18 1046  BP: 105/84 113/74  Pulse: 73 60  Resp: (!) 21 (!) 9  Temp:    SpO2: 99% 100%    Last Pain:  Vitals:   06/30/18 1046  TempSrc:   PainSc: 0-No pain                 Martha Clan

## 2018-06-30 NOTE — Transfer of Care (Signed)
Immediate Anesthesia Transfer of Care Note  Patient: Molly Faulkner  Procedure(s) Performed: COLONOSCOPY WITH PROPOFOL (N/A )  Patient Location: PACU  Anesthesia Type:General  Level of Consciousness: sedated  Airway & Oxygen Therapy: Patient Spontanous Breathing and Patient connected to nasal cannula oxygen  Post-op Assessment: Report given to RN and Post -op Vital signs reviewed and stable  Post vital signs: Reviewed and stable  Last Vitals:  Vitals Value Taken Time  BP 104/66 06/30/2018 10:16 AM  Temp 36.1 C 06/30/2018 10:16 AM  Pulse 65 06/30/2018 10:16 AM  Resp 18 06/30/2018 10:16 AM  SpO2 98 % 06/30/2018 10:16 AM    Last Pain:  Vitals:   06/30/18 0915  TempSrc: Tympanic  PainSc: 0-No pain         Complications: No apparent anesthesia complications

## 2018-07-01 ENCOUNTER — Encounter: Payer: Self-pay | Admitting: Gastroenterology

## 2018-07-02 LAB — SURGICAL PATHOLOGY

## 2018-07-03 DIAGNOSIS — M545 Low back pain: Secondary | ICD-10-CM | POA: Diagnosis not present

## 2018-07-07 ENCOUNTER — Encounter: Payer: Self-pay | Admitting: Gastroenterology

## 2018-07-07 DIAGNOSIS — M545 Low back pain: Secondary | ICD-10-CM | POA: Diagnosis not present

## 2018-07-09 DIAGNOSIS — M545 Low back pain: Secondary | ICD-10-CM | POA: Diagnosis not present

## 2018-07-14 DIAGNOSIS — M545 Low back pain: Secondary | ICD-10-CM | POA: Diagnosis not present

## 2018-07-16 DIAGNOSIS — M545 Low back pain: Secondary | ICD-10-CM | POA: Diagnosis not present

## 2018-07-21 DIAGNOSIS — M545 Low back pain: Secondary | ICD-10-CM | POA: Diagnosis not present

## 2018-07-28 DIAGNOSIS — M545 Low back pain: Secondary | ICD-10-CM | POA: Diagnosis not present

## 2018-07-30 DIAGNOSIS — M545 Low back pain: Secondary | ICD-10-CM | POA: Diagnosis not present

## 2018-08-04 ENCOUNTER — Encounter: Payer: Self-pay | Admitting: *Deleted

## 2018-08-04 DIAGNOSIS — M545 Low back pain: Secondary | ICD-10-CM | POA: Diagnosis not present

## 2018-08-04 NOTE — Progress Notes (Signed)
Dr. Frederico Hamman T. Chetan Mehring, MD, [facility] and Sports Medicine [place], [address] Phone: [phone] Fax: [phone]  08/05/2018  Patient: Molly Faulkner, MRN: [id number], DOB: [birth year], 77 y.o.  Primary Physician:  Jearld Fenton, NP   Chief Complaint  Patient presents with  . Wrist Pain    Bilateral   Subjective:   Molly Faulkner is a 77 y.o. very pleasant female patient who presents with the following:  Pleasant 77 year old patient who is seen at the request of Mrs. Garnette Gunner for evaluation of bilateral wrist pain.  Has used some meloxicam and splints for her carpal tunnel.   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   She has pain at the [facility] joint bilaterally.  This is at the first joint at the thumb.  She also has pain in the first dorsal compartment on the right side, this is significantly more than on the left.  She also has some fullness and decreased range of motion in her wrist and pain and aching in the range of motion at the wrist also.  She also does have some intermittent carpal tunnel symptoms with some tingling.  DeQuarvain's  - R worse than the left CMC OA bilaterally Wrist OA bilaterally  Past Medical History, Surgical History, Social History, Family History, Problem List, Medications, and Allergies have been reviewed and updated if relevant.  Patient Active Problem List   Diagnosis Date Noted  . Osteoarthritis of lower back 05/29/2018  . Chronic seasonal allergic rhinitis due to pollen 02/20/2017  . OSA on CPAP 07/01/2016  . Hyperlipidemia 02/21/2015  . Insomnia 07/25/2014  . HTN (hypertension) 07/25/2014  . GERD 07/25/2014  . Depression 07/25/2014  . Asthma, moderate persistent, well-controlled 07/25/2014    Past Medical History:  Diagnosis Date  . Allergy   . Arthritis   . Asthma   . Carpal tunnel syndrome 12/2017   left, referred to neurology for EMG testing  . 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/2017  . Chicken pox    . Depression   . Diverticulitis   . Diverticulosis   . Dysrhythmia    paroxysmal tachycardia  . GERD (gastroesophageal reflux disease)   . Hx of colonic polyps   . Hyperlipidemia   . Hypertension   . Insomnia   . 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 Sleep apnea    H/O   NO LONGER  . Syncope     Past Surgical History:  Procedure Laterality Date  . ABDOMINAL HYSTERECTOMY  1994   total  . BREAST BIOPSY Bilateral [id number]  . CARPAL TUNNEL RELEASE Bilateral   . CATARACT EXTRACTION W/PHACO Right 12/02/2017   Procedure: CATARACT EXTRACTION PHACO AND INTRAOCULAR LENS PLACEMENT (IOC);  Surgeon: Birder Robson, MD;  Location: [facility] ORS;  Service: Ophthalmology;  Laterality: Right;  [place] 00:36 AP% 11.5 CDE 4.21 Fluid pack lot # [id number]  . CATARACT EXTRACTION W/PHACO Left 12/30/2017   Procedure: CATARACT EXTRACTION PHACO AND INTRAOCULAR LENS PLACEMENT ([place]);  Surgeon: Birder Robson, MD;  Location: [facility] ORS;  Service: Ophthalmology;  Laterality: Left;  [place] 00:58 AP% 11.6 CDE 6.80 Fluid pack lot # [id number]  . COLONOSCOPY WITH PROPOFOL N/A 06/30/2018   Procedure: COLONOSCOPY WITH PROPOFOL;  Surgeon: Jonathon Bellows, MD;  Location: [facility] ENDOSCOPY;  Service: Gastroenterology;  Laterality: N/A;  . DILATION AND CURETTAGE OF UTERUS     X 2  . KNEE ARTHROSCOPY      Social History   Socioeconomic History  . Marital  status: Single    Spouse name: Not on file  . Number of children: Not on file  . Years of education: Not on file  . Highest education level: Not on file  Occupational History  . Not on file  Social Needs  . Financial resource strain: Not on file  . Food insecurity:    Worry: Not on file    Inability: Not on file  . Transportation needs:    Medical: Not on file    Non-medical: Not on file  Tobacco Use  . Smoking status: Former Smoker    Packs/day: 0.50    Years: 7.00    Pack years: 3.50  . Smokeless tobacco: Never Used  . Tobacco comment: quit over 40 years ago  Substance and  Sexual Activity  . Alcohol use: Yes    Comment: rare--liquor  . Drug use: No  . Sexual activity: Not Currently  Lifestyle  . Physical activity:    Days per week: Not on file    Minutes per session: Not on file  . Stress: Not on file  Relationships  . Social connections:    Talks on phone: Not on file    Gets together: Not on file    Attends religious service: Not on file    Active member of club or organization: Not on file    Attends meetings of clubs or organizations: Not on file    Relationship status: Not on file  . Intimate partner violence:    Fear of current or ex partner: Not on file    Emotionally abused: Not on file    Physically abused: Not on file    Forced sexual activity: Not on file  Other Topics Concern  . Not on file  Social History Narrative  . Not on file    Family History  Problem Relation Age of Onset  . Breast cancer Mother 46  . Arthritis Maternal Grandmother   . Diabetes Maternal Grandmother     Allergies  Allergen Reactions  . Avelox [Moxifloxacin Hcl In Nacl] Other (See Comments)    Chills  . Trazodone And Nefazodone Swelling  . Clarithromycin Other (See Comments)    Unsure of reaction type    Medication list reviewed and updated in full in [place].  GEN: No fevers, chills. Nontoxic. Primarily MSK c/o today. MSK: Detailed in the HPI GI: tolerating PO intake without difficulty Neuro: No numbness, parasthesias, or tingling associated. Otherwise the pertinent positives of the ROS are noted above.   Objective:   BP 120/62   Pulse 61   Temp 98.5 F (36.9 C) (Oral)   Ht 5\' 1"  (1.549 m)   Wt 189 lb 12 oz (86.1 kg)   BMI 35.85 kg/m    GEN: WDWN, NAD, Non-toxic, Alert & Oriented x 3 HEENT: Atraumatic, Normocephalic.  Ears and Nose: No external deformity. EXTR: No clubbing/cyanosis/edema NEURO: Normal gait.  PSYCH: Normally interactive. Conversant. Not depressed or anxious appearing.  Calm demeanor.   Hand: b Ecchymosis  or edema: neg ROM wrist/hand/digits/elbow: 30% loss of motion Carpals, MCP's, digits: basal joints ttp Distal Ulna and Radius: NT Supination lift test: neg Ecchymosis or edema: neg Cysts/nodules: neg Finkelstein's test: POS Snuffbox tenderness: neg Scaphoid tubercle: NT Hook of Hamate: NT Resisted supination: NT Full composite fist Grip, all digits: 5/5 str No tenosynovitis Axial load test: neg Phalen's: neg Tinel's: neg Atrophy: neg  Hand sensation: intact   Radiology: No results found.  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  I tried to fit her with a thumb spica splint in the office, but none could fit her.  I gave her prescription and referred her outside to a brace shop to get a thumb spica splint.  She will continue this for 2 to 3 weeks, then begin some hand rehab exercises.  Oral prednisone which hopefully will call down all of the above to some degree.  She does understand that her [facility] joint osteoarthritis as well as her hand and wrist osteoarthritis or go to be chronic in nature.  Follow-up: Return in about 1 month (around 09/05/2018).  Meds ordered this encounter  Medications  . predniSONE (DELTASONE) 20 MG tablet    Sig: 2 tabs po for 4 days, then 1 tab po for 4 days    Dispense:  12 tablet    Refill:  0  . atenolol (TENORMIN) 50 MG tablet    Sig: Take 1.5 tablets (75 mg total) by mouth daily.    Dispense:  135 tablet    Refill:  0   Signed,  Cassidee Deats T. Kiowa Peifer, MD   Allergies as of 08/05/2018      Reactions   Avelox [moxifloxacin Hcl In Nacl] Other (See Comments)   Chills   Trazodone And Nefazodone Swelling   Clarithromycin Other (See Comments)   Unsure of reaction type      Medication List        Accurate as of 08/05/18  2:06 PM. Always use your most recent med list.          ADVAIR HFA 115-21  MCG/ACT inhaler Generic drug:  fluticasone-salmeterol INHALE 1 PUFF BY MOUTH TWICE DAILY   albuterol 108 (90 Base) MCG/ACT inhaler Commonly known as:  PROVENTIL HFA;VENTOLIN HFA Inhale 2 puffs into the lungs every 6 (six) hours as needed for wheezing or shortness of breath.   ARNICA EX Apply 1 application topically 4 (four) times daily as needed (for arthritis pain.).   atenolol 50 MG tablet Commonly known as:  TENORMIN Take 1.5 tablets (75 mg total) by mouth daily.   cholecalciferol 1000 units tablet Commonly known as:  VITAMIN D Take 1,000 Units by mouth every other day. At bedtime.   CREAM BASE EX Apply 1-2 application topically 4 (four) times daily as needed (for arthritis pain.). Penetrex Inflammation Formula (Arnica/Pyridoxine/MSM/Boswellia/Cetyl Myristoleate)   EYE SUPPORT PO Take 1 tablet by mouth at bedtime.   Fish Oil 1200 MG Caps Take 1,290 mg by mouth every other day. In the morning   hydrochlorothiazide 25 MG tablet Commonly known as:  HYDRODIURIL TAKE 1 TABLET EVERY DAY   ibuprofen 200 MG tablet Commonly known as:  ADVIL,MOTRIN Take 200 mg by mouth at bedtime as needed (for arthritis pain.).   LEG VEIN & CIRCULATION PO Take 1 capsule by mouth every other day. In the morning. Jeannetta Nap Circulation and Vein Support   losartan 50 MG tablet Commonly known as:  COZAAR TAKE 1 TABLET TWICE DAILY   Magnesium Chloride Powd Apply 1 application topically 4 (four) times daily as needed (for arthritis pain.). Magnesium Oil with Arnica   mirtazapine 15 MG disintegrating tablet Commonly known as:  REMERON SOL-TAB Take 1 tablet (15 mg total) by mouth at bedtime.   NUTRITIONAL SUPPLEMENT PO Take 1 capsule by mouth every other day. In the morning. Jeannetta Nap Five Favorites (CoQ10-Alpha Lipoic Acid-Resveratrol-EGCG)   Chinita [place]  EX Apply 1-2 application topically 4 (four) times daily as needed (for arthritis pain relief.).   PAIN RELIEVING Crea Apply 1  application topically 4 (four) times daily as needed (for arthritis pain). EFAC PAIN RELIEVING CREAM   predniSONE 20 MG tablet Commonly known as:  DELTASONE 2 tabs po for 4 days, then 1 tab po for 4 days

## 2018-08-05 ENCOUNTER — Ambulatory Visit (INDEPENDENT_AMBULATORY_CARE_PROVIDER_SITE_OTHER): Payer: Medicare HMO | Admitting: Family Medicine

## 2018-08-05 ENCOUNTER — Telehealth: Payer: Self-pay | Admitting: Internal Medicine

## 2018-08-05 ENCOUNTER — Encounter: Payer: Self-pay | Admitting: Family Medicine

## 2018-08-05 VITALS — BP 120/62 | HR 61 | Temp 98.5°F | Ht 61.0 in | Wt 189.8 lb

## 2018-08-05 DIAGNOSIS — M180 Bilateral primary osteoarthritis of first carpometacarpal joints: Secondary | ICD-10-CM

## 2018-08-05 DIAGNOSIS — M19031 Primary osteoarthritis, right wrist: Secondary | ICD-10-CM

## 2018-08-05 DIAGNOSIS — M19032 Primary osteoarthritis, left wrist: Secondary | ICD-10-CM | POA: Diagnosis not present

## 2018-08-05 DIAGNOSIS — M654 Radial styloid tenosynovitis [de Quervain]: Secondary | ICD-10-CM | POA: Diagnosis not present

## 2018-08-05 MED ORDER — ATENOLOL 50 MG PO TABS: 75.0000 mg | ORAL_TABLET | Freq: Every day | ORAL | 0 refills | Status: DC

## 2018-08-05 MED ORDER — PREDNISONE 20 MG PO TABS: ORAL_TABLET | ORAL | 0 refills | Status: DC

## 2018-08-05 NOTE — Telephone Encounter (Signed)
Message not needed. °

## 2018-08-06 DIAGNOSIS — M545 Low back pain: Secondary | ICD-10-CM | POA: Diagnosis not present

## 2018-09-23 ENCOUNTER — Ambulatory Visit (INDEPENDENT_AMBULATORY_CARE_PROVIDER_SITE_OTHER): Payer: Medicare HMO | Admitting: Family Medicine

## 2018-09-23 ENCOUNTER — Encounter: Payer: Self-pay | Admitting: Family Medicine

## 2018-09-23 VITALS — BP 112/64 | HR 65 | Temp 98.5°F | Ht 61.0 in | Wt 192.5 lb

## 2018-09-23 DIAGNOSIS — M19031 Primary osteoarthritis, right wrist: Secondary | ICD-10-CM

## 2018-09-23 DIAGNOSIS — M19032 Primary osteoarthritis, left wrist: Secondary | ICD-10-CM

## 2018-09-23 DIAGNOSIS — M79671 Pain in right foot: Secondary | ICD-10-CM

## 2018-09-23 DIAGNOSIS — M180 Bilateral primary osteoarthritis of first carpometacarpal joints: Secondary | ICD-10-CM | POA: Diagnosis not present

## 2018-09-23 DIAGNOSIS — M79672 Pain in left foot: Secondary | ICD-10-CM | POA: Diagnosis not present

## 2018-09-23 DIAGNOSIS — M654 Radial styloid tenosynovitis [de Quervain]: Secondary | ICD-10-CM | POA: Diagnosis not present

## 2018-09-23 DIAGNOSIS — Z23 Encounter for immunization: Secondary | ICD-10-CM

## 2018-09-23 NOTE — Progress Notes (Signed)
Dr. Frederico Hamman T. Semya Klinke, MD, [facility] Primary [facility] and Sports Medicine [place], [address] Phone: [phone] Fax: [phone]  09/23/2018  Patient: Molly Faulkner, MRN: [id number], DOB: 09-18-1941, 77 y.o.  Primary Physician:  Jearld Fenton, NP   Chief Complaint  Patient presents with  . Follow-up    De Quervain's Tenosynovitis   Subjective:   Molly Faulkner is a 77 y.o. very pleasant female patient who presents with the following:  Dampened back some - sleeping in braces.  She is a well-known patient, and her hand and wrist pain overall is improved with the exception of her right de Quervain's tenosynovitis.  The CMC joints bilaterally are improved, but still sometimes symptomatic.  She does understand that she does have osteoarthritis.  She is using various topical liniments as well as some NSAIDs.  She is also taking some fish oil and using some braces intermittently.  R hand is more problematic - de q and in the [facility] joint arthritis. Got a brace from [place].  CTS mod, EMG proven ? Trigger   Cyst, dorsum 4th finger.   [facility] - has been going for her back. [facility] at [facility]  08/05/2018 Last OV with Owens Loffler, MD  Pleasant 77 year old patient who is seen at the request of Mrs. Garnette Gunner for evaluation of bilateral wrist pain.  Has used some meloxicam and splints for her carpal tunnel.   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   She has pain at the [facility] joint bilaterally.  This is at the first joint at the thumb.  She also has pain in the first dorsal compartment on the right side, this is significantly more than on the left.  She also has some fullness and decreased range of motion in her wrist and pain and aching in the range of motion at the wrist also.  She also does have some intermittent carpal tunnel symptoms with some tingling.  DeQuarvain's  - R worse than the left CMC OA bilaterally Wrist OA bilaterally  Past Medical  History, Surgical History, Social History, Family History, Problem List, Medications, and Allergies have been reviewed and updated if relevant.  Patient Active Problem List   Diagnosis Date Noted  . Osteoarthritis of lower back 05/29/2018  . Chronic seasonal allergic rhinitis due to pollen 02/20/2017  . OSA on CPAP 07/01/2016  . Hyperlipidemia 02/21/2015  . Insomnia 07/25/2014  . HTN (hypertension) 07/25/2014  . GERD 07/25/2014  . Depression 07/25/2014  . Asthma, moderate persistent, well-controlled 07/25/2014    Past Medical History:  Diagnosis Date  . Allergy   . Arthritis   . Asthma   . Carpal tunnel syndrome 12/2017   left, referred to neurology for EMG testing  . 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/2017  . Chicken pox   . Depression   . Diverticulitis   . Diverticulosis   . Dysrhythmia    paroxysmal tachycardia  . GERD (gastroesophageal reflux disease)   . Hx of colonic polyps   . Hyperlipidemia   . Hypertension   . Insomnia   . 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 Sleep apnea    H/O   NO LONGER  . Syncope     Past Surgical History:  Procedure Laterality Date  . ABDOMINAL HYSTERECTOMY  1994   total  . BREAST BIOPSY Bilateral [id number]  . CARPAL TUNNEL RELEASE Bilateral   . CATARACT EXTRACTION W/PHACO Right 12/02/2017   Procedure: CATARACT EXTRACTION PHACO AND INTRAOCULAR LENS  PLACEMENT (IOC);  Surgeon: Birder Robson, MD;  Location: [facility] ORS;  Service: Ophthalmology;  Laterality: Right;  [place] 00:36 AP% 11.5 CDE 4.21 Fluid pack lot # [id number]  . CATARACT EXTRACTION W/PHACO Left 12/30/2017   Procedure: CATARACT EXTRACTION PHACO AND INTRAOCULAR LENS PLACEMENT ([place]);  Surgeon: Birder Robson, MD;  Location: [facility] ORS;  Service: Ophthalmology;  Laterality: Left;  [place] 00:58 AP% 11.6 CDE 6.80 Fluid pack lot # [id number]  . COLONOSCOPY WITH PROPOFOL N/A 06/30/2018   Procedure: COLONOSCOPY WITH PROPOFOL;  Surgeon: Jonathon Bellows, MD;  Location: [facility] ENDOSCOPY;  Service: Gastroenterology;   Laterality: N/A;  . DILATION AND CURETTAGE OF UTERUS     X 2  . KNEE ARTHROSCOPY      Social History   Socioeconomic History  . Marital status: Single    Spouse name: Not on file  . Number of children: Not on file  . Years of education: Not on file  . Highest education level: Not on file  Occupational History  . Not on file  Social Needs  . Financial resource strain: Not on file  . Food insecurity:    Worry: Not on file    Inability: Not on file  . Transportation needs:    Medical: Not on file    Non-medical: Not on file  Tobacco Use  . Smoking status: Former Smoker    Packs/day: 0.50    Years: 7.00    Pack years: 3.50  . Smokeless tobacco: Never Used  . Tobacco comment: quit over 40 years ago  Substance and Sexual Activity  . Alcohol use: Yes    Comment: rare--liquor  . Drug use: No  . Sexual activity: Not Currently  Lifestyle  . Physical activity:    Days per week: Not on file    Minutes per session: Not on file  . Stress: Not on file  Relationships  . Social connections:    Talks on phone: Not on file    Gets together: Not on file    Attends religious service: Not on file    Active member of club or organization: Not on file    Attends meetings of clubs or organizations: Not on file    Relationship status: Not on file  . Intimate partner violence:    Fear of current or ex partner: Not on file    Emotionally abused: Not on file    Physically abused: Not on file    Forced sexual activity: Not on file  Other Topics Concern  . Not on file  Social History Narrative  . Not on file    Family History  Problem Relation Age of Onset  . Breast cancer Mother 66  . Arthritis Maternal Grandmother   . Diabetes Maternal Grandmother     Allergies  Allergen Reactions  . Avelox [Moxifloxacin Hcl In Nacl] Other (See Comments)    Chills  . Trazodone And Nefazodone Swelling  . Clarithromycin Other (See Comments)    Unsure of reaction type    Medication list  reviewed and updated in full in [place].  GEN: No fevers, chills. Nontoxic. Primarily MSK c/o today. MSK: Detailed in the HPI GI: tolerating PO intake without difficulty Neuro: No numbness, parasthesias, or tingling associated. Otherwise the pertinent positives of the ROS are noted above.   Objective:   BP 112/64   Pulse 65   Temp 98.5 F (36.9 C) (Oral)   Ht 5\' 1"  (1.549 m)   Wt 192 lb 8 oz (87.3 kg)  BMI 36.37 kg/m    GEN: WDWN, NAD, Non-toxic, Alert & Oriented x 3 HEENT: Atraumatic, Normocephalic.  Ears and Nose: No external deformity. EXTR: No clubbing/cyanosis/edema NEURO: Normal gait.  PSYCH: Normally interactive. Conversant. Not depressed or anxious appearing.  Calm demeanor.    Bilateral hands with some early arthritic features and some minimal pain at bilateral basal joints on the first.  She also has a positive Finkelstein's on the right.  Wrist range of motion is improved and no significant effusion.  No significant MCP synovitis.  Radiology: No results found.  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  Relatively pleased with all outcomes with the exception of de Quervain's.  I offered her a de Quervain's injection today, but she would like to be a little bit more  conservative for now.  We are going to do some physical therapy, and also try some Decadron iontophoresis.  She also is having some intermittent foot pain and wanted to get a podiatry evaluation which I think is reasonable.  Follow-up: Return in about 6 weeks (around 11/04/2018).  Orders Placed This Encounter  Procedures  . Flu Vaccine QUAD 6+ mos PF IM (Fluarix Quad PF)  . Ambulatory referral to Podiatry  . Ambulatory referral to Occupational Therapy    Signed,  Frederico Hamman T. Mohogany Toppins, MD   Allergies as of 09/23/2018      Reactions   Avelox [moxifloxacin Hcl In Nacl] Other (See Comments)   Chills   Trazodone And Nefazodone Swelling   Clarithromycin Other (See Comments)   Unsure of reaction type      Medication List        Accurate as of 09/23/18 11:59 PM. Always use your most recent med list.          ADVAIR HFA 115-21 MCG/ACT inhaler Generic drug:  fluticasone-salmeterol INHALE 1 PUFF BY MOUTH TWICE DAILY   albuterol 108 (90 Base) MCG/ACT inhaler Commonly known as:  PROVENTIL HFA;VENTOLIN HFA Inhale 2 puffs into the lungs every 6 (six) hours as needed for wheezing or shortness of breath.   ARNICA EX Apply 1 application topically 4 (four) times daily as needed (for arthritis pain.).   atenolol 50 MG tablet Commonly known as:  TENORMIN Take 1.5 tablets (75 mg total) by mouth daily.   cholecalciferol 1000 units tablet Commonly known as:  VITAMIN D Take 1,000 Units by mouth every other day. At bedtime.   CREAM BASE EX Apply 1-2 application topically 4 (four) times daily as needed (for arthritis pain.). Penetrex Inflammation Formula (Arnica/Pyridoxine/MSM/Boswellia/Cetyl Myristoleate)   EYE SUPPORT PO Take 1 tablet by mouth at bedtime.   Fish Oil 1200 MG Caps Take 1,290 mg by mouth every other day. In the morning   hydrochlorothiazide 25 MG tablet Commonly known as:  HYDRODIURIL TAKE 1 TABLET EVERY DAY   ibuprofen 200 MG tablet Commonly known as:   ADVIL,MOTRIN Take 200 mg by mouth at bedtime as needed (  for arthritis pain.).   LEG VEIN & CIRCULATION PO Take 1 capsule by mouth every other day. In the morning. Jeannetta Nap Circulation and Vein Support   losartan 50 MG tablet Commonly known as:  COZAAR TAKE 1 TABLET TWICE DAILY   Magnesium Chloride Powd Apply 1 application topically 4 (four) times daily as needed (for arthritis pain.). Magnesium Oil with Arnica   mirtazapine 15 MG disintegrating tablet Commonly known as:  REMERON SOL-TAB Take 1 tablet (15 mg total) by mouth at bedtime.   NUTRITIONAL SUPPLEMENT PO Take 1 capsule by mouth every other day. In the morning. Jeannetta Nap Five Favorites (CoQ10-Alpha Lipoic Acid-Resveratrol-EGCG)   ORTHOGEL EX Apply 1-2 application topically 4 (four) times daily as needed (for arthritis pain relief.).   PAIN RELIEVING Crea Apply 1 application topically 4 (four) times daily as needed (for arthritis pain). [facility] PAIN RELIEVING CREAM

## 2018-09-23 NOTE — Patient Instructions (Signed)
REFERRALS TO SPECIALISTS, SPECIAL TESTS (MRI, CT, ULTRASOUNDS)  MARION or  Anastasiya will help you. ASK CHECK-IN FOR HELP.  Specialist appointment times vary a great deal, based on their schedule / openings. -- Some specialists have very long wait times. (Example. Dermatology)    

## 2018-09-24 ENCOUNTER — Ambulatory Visit: Payer: Medicare HMO | Admitting: Internal Medicine

## 2018-09-24 ENCOUNTER — Encounter: Payer: Self-pay | Admitting: Internal Medicine

## 2018-09-24 VITALS — BP 120/66 | HR 54 | Resp 16 | Ht 61.0 in | Wt 191.8 lb

## 2018-09-24 DIAGNOSIS — G4733 Obstructive sleep apnea (adult) (pediatric): Secondary | ICD-10-CM | POA: Diagnosis not present

## 2018-09-24 DIAGNOSIS — J4520 Mild intermittent asthma, uncomplicated: Secondary | ICD-10-CM

## 2018-09-24 MED ORDER — ALBUTEROL SULFATE HFA 108 (90 BASE) MCG/ACT IN AERS
2.0000 | INHALATION_SPRAY | Freq: Four times a day (QID) | RESPIRATORY_TRACT | 2 refills | Status: DC | PRN
Start: 2018-09-24 — End: 2019-09-22

## 2018-09-24 MED ORDER — ALBUTEROL SULFATE HFA 108 (90 BASE) MCG/ACT IN AERS: 2.0000 | INHALATION_SPRAY | Freq: Four times a day (QID) | RESPIRATORY_TRACT | 2 refills | Status: DC | PRN

## 2018-09-24 NOTE — Progress Notes (Signed)
  [facility] Consultation    Date: 09/24/2018  MRN# [id number] Molly Faulkner 08-Sep-1941  Referring Physician: NP Mel Faulkner PMD - Molly Dezerae Freiberger is a 77 y.o. old female seen in consultation for sleep apnea evaluation and ASTHMA  CC:  Follow up ASTHMA Follow up OSA  HPI:  ASTHMA-uses advair Albuterol as needed No exacerbation at this time No signs of infection Allergies to cats and dogs  Follow up OSA Tolerating CPAP 10 cm h20 Using nasal pillows 100% compliance for days and >4hrs AHI is 0.5  Stays active No acute issues     Allergies:  Avelox [moxifloxacin hcl in nacl]; Trazodone and nefazodone; and Clarithromycin  Review of Systems  Constitutional: Negative for chills, diaphoresis, fever and malaise/fatigue.  HENT: Negative for hearing loss.   Eyes: Negative for blurred vision and double vision.  Respiratory: Negative for cough, hemoptysis, sputum production, shortness of breath and wheezing.   Cardiovascular: Negative for chest pain.  Gastrointestinal: Negative for heartburn, nausea and vomiting.  Genitourinary: Negative for dysuria.  Musculoskeletal: Negative for myalgias.  Skin: Negative for rash.  Neurological: Negative for dizziness.  Endo/Heme/Allergies: Negative for environmental allergies. Does not bruise/bleed easily.  All other systems reviewed and are negative.   BP 120/66 (BP Location: Left Arm, Patient Position: Sitting, Cuff Size: Normal)   Pulse (!) 54   Resp 16   Ht 5\' 1"  (1.549 m)   Wt 191 lb 12.8 oz (87 kg)   SpO2 99%   BMI 36.24 kg/m   Physical Examination:   GENERAL:NAD, no fevers, chills, no weakness no fatigue HEAD: Normocephalic, atraumatic.  EYES: Pupils equal, round, reactive to light. Extraocular muscles intact. No scleral icterus.  MOUTH: Moist mucosal membrane. Dentition intact. No abscess noted.  EAR, NOSE, THROAT: Clear without exudates. No external lesions.  NECK: Supple. No thyromegaly. No  nodules. No JVD.  PULMONARY: CTA B/L no wheezing, rhonchi, crackles CARDIOVASCULAR: S1 and S2. Regular rate and rhythm. No murmurs, rubs, or gallops. No edema. Pedal pulses 2+ bilaterally.  GASTROINTESTINAL: Soft, nontender, nondistended. No masses. Positive bowel sounds. No hepatosplenomegaly.  MUSCULOSKELETAL: No swelling, clubbing, or edema. Range of motion full in all extremities.  NEUROLOGIC: Cranial nerves II through XII are intact. No gross focal neurological deficits. Sensation intact. Reflexes intact.  SKIN: No ulceration, lesions, rashes, or cyanosis. Skin warm and dry. Turgor intact.  PSYCHIATRIC: Mood, affect within normal limits. The patient is awake, alert and oriented x 3. Insight, judgment intact.  ALL OTHER ROS ARE NEGATIVE      Assessment and Plan: 77 yo female with DX of OSA and ASTHMA OSA is unde control with CPAP therapy and doing well. Her ASTHMA is mild intermittent and well controlled as this time    #1 ASTHMA -well controlled Mild intermittent Continue advair as prescribed-she notices a difference when she misses her dose Continue albuterol as needed Avoid allergens  #2 OSA Continue CPAP as prescribed AHI is 0.5 on current therapy Patient using and benefiting from therapy   #3 Allergic Rhinitis Nasacort and antihistamines as needed  #4 Obesity -recommend significant weight loss -recommend changing diet  #5 Deconditioned state -Recommend increased daily activity and exercise   Patient satisfied with Plan of action and management. All questions answered Follow up in 1 year  Larah Kuntzman Patricia Pesa, M.D.  [facility]  Medical Director [facility] Director [facility]io-P[facility] Department

## 2018-09-24 NOTE — Addendum Note (Signed)
Addended by: Stephanie Coup on: 09/24/2018 04:48 PM   Modules accepted: Orders

## 2018-09-24 NOTE — Patient Instructions (Signed)
Continue inhalers as needed Continue CPAP therapy as prescribed

## 2018-09-29 DIAGNOSIS — M25531 Pain in right wrist: Secondary | ICD-10-CM | POA: Diagnosis not present

## 2018-09-29 DIAGNOSIS — M25541 Pain in joints of right hand: Secondary | ICD-10-CM | POA: Diagnosis not present

## 2018-09-29 DIAGNOSIS — M25532 Pain in left wrist: Secondary | ICD-10-CM | POA: Diagnosis not present

## 2018-09-29 DIAGNOSIS — M25542 Pain in joints of left hand: Secondary | ICD-10-CM | POA: Diagnosis not present

## 2018-10-01 DIAGNOSIS — M25531 Pain in right wrist: Secondary | ICD-10-CM | POA: Diagnosis not present

## 2018-10-01 DIAGNOSIS — M25532 Pain in left wrist: Secondary | ICD-10-CM | POA: Diagnosis not present

## 2018-10-01 DIAGNOSIS — M25542 Pain in joints of left hand: Secondary | ICD-10-CM | POA: Diagnosis not present

## 2018-10-01 DIAGNOSIS — M25541 Pain in joints of right hand: Secondary | ICD-10-CM | POA: Diagnosis not present

## 2018-10-06 DIAGNOSIS — M25541 Pain in joints of right hand: Secondary | ICD-10-CM | POA: Diagnosis not present

## 2018-10-06 DIAGNOSIS — M25542 Pain in joints of left hand: Secondary | ICD-10-CM | POA: Diagnosis not present

## 2018-10-06 DIAGNOSIS — M25532 Pain in left wrist: Secondary | ICD-10-CM | POA: Diagnosis not present

## 2018-10-06 DIAGNOSIS — M25531 Pain in right wrist: Secondary | ICD-10-CM | POA: Diagnosis not present

## 2018-10-08 DIAGNOSIS — M25541 Pain in joints of right hand: Secondary | ICD-10-CM | POA: Diagnosis not present

## 2018-10-08 DIAGNOSIS — M25542 Pain in joints of left hand: Secondary | ICD-10-CM | POA: Diagnosis not present

## 2018-10-08 DIAGNOSIS — M25531 Pain in right wrist: Secondary | ICD-10-CM | POA: Diagnosis not present

## 2018-10-08 DIAGNOSIS — M25532 Pain in left wrist: Secondary | ICD-10-CM | POA: Diagnosis not present

## 2018-10-13 ENCOUNTER — Ambulatory Visit: Payer: Self-pay | Admitting: Podiatry

## 2018-10-16 ENCOUNTER — Ambulatory Visit: Payer: Medicare HMO | Admitting: Family Medicine

## 2018-10-16 DIAGNOSIS — Z0289 Encounter for other administrative examinations: Secondary | ICD-10-CM

## 2018-10-19 ENCOUNTER — Ambulatory Visit (INDEPENDENT_AMBULATORY_CARE_PROVIDER_SITE_OTHER): Payer: Medicare HMO | Admitting: Internal Medicine

## 2018-10-19 ENCOUNTER — Encounter: Payer: Self-pay | Admitting: Internal Medicine

## 2018-10-19 VITALS — BP 124/82 | HR 98 | Temp 99.4°F | Wt 192.0 lb

## 2018-10-19 DIAGNOSIS — B9789 Other viral agents as the cause of diseases classified elsewhere: Secondary | ICD-10-CM | POA: Diagnosis not present

## 2018-10-19 DIAGNOSIS — J329 Chronic sinusitis, unspecified: Secondary | ICD-10-CM

## 2018-10-19 NOTE — Patient Instructions (Signed)

## 2018-10-19 NOTE — Progress Notes (Signed)
HPI  Patient presents to the clinic today with complaint of nasal congestion, sore throat and slight cough.  She reports this started 6 days ago.  She is blowing clear/bloody mucus out of her nose.  She denies difficulty swallowing.  The cough is dry and nonproductive.  She is running low-grade fevers, but denies chills or body aches.  She has tried Zicam and nasal saline with some relief.  She has a history of allergies and asthma, controlled on Advair daily.  She has only used her Albuterol at one time.  She has not had sick contacts that she is aware of.  Review of Systems     Past Medical History:  Diagnosis Date  . Allergy   . Arthritis   . Asthma   . Carpal tunnel syndrome 12/2017   left, referred to neurology for EMG testing  . 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/2017  . Chicken pox   . Depression   . Diverticulitis   . Diverticulosis   . Dysrhythmia    paroxysmal tachycardia  . GERD (gastroesophageal reflux disease)   . Hx of colonic polyps   . Hyperlipidemia   . Hypertension   . Insomnia   . 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 Sleep apnea    H/O   NO LONGER  . Syncope     Family History  Problem Relation Age of Onset  . Breast cancer Mother 27  . Arthritis Maternal Grandmother   . Diabetes Maternal Grandmother     Social History   Socioeconomic History  . Marital status: Single    Spouse name: Not on file  . Number of children: Not on file  . Years of education: Not on file  . Highest education level: Not on file  Occupational History  . Not on file  Social Needs  . Financial resource strain: Not on file  . Food insecurity:    Worry: Not on file    Inability: Not on file  . Transportation needs:    Medical: Not on file    Non-medical: Not on file  Tobacco Use  . Smoking status: Former Smoker    Packs/day: 0.50    Years: 7.00    Pack years: 3.50  . Smokeless tobacco: Never Used  . Tobacco comment: quit over 40 years ago  Substance and Sexual Activity  . Alcohol use:  Yes    Comment: rare--liquor  . Drug use: No  . Sexual activity: Not Currently  Lifestyle  . Physical activity:    Days per week: Not on file    Minutes per session: Not on file  . Stress: Not on file  Relationships  . Social connections:    Talks on phone: Not on file    Gets together: Not on file    Attends religious service: Not on file    Active member of club or organization: Not on file    Attends meetings of clubs or organizations: Not on file    Relationship status: Not on file  . Intimate partner violence:    Fear of current or ex partner: Not on file    Emotionally abused: Not on file    Physically abused: Not on file    Forced sexual activity: Not on file  Other Topics Concern  . Not on file  Social History Narrative  . Not on file    Allergies  Allergen Reactions  . Avelox [Moxifloxacin Hcl In Nacl] Other (See Comments)    Chills  .  Trazodone And Nefazodone Swelling  . Clarithromycin Other (See Comments)    Unsure of reaction type     Constitutional: Positive  fever. Denies headache, fatigue or abrupt weight changes.  HEENT:  Positive facial pain, nasal congestion and sore throat. Denies eye redness, ear pain, ringing in the ears, wax buildup, runny nose or bloody nose. Respiratory: Positive cough. Denies difficulty breathing or shortness of breath.  Cardiovascular: Denies chest pain, chest tightness, palpitations or swelling in the hands or feet.   No other specific complaints in a complete review of systems (except as listed in HPI above).  Objective:   BP 124/82   Pulse 98   Temp 99.4 F (37.4 C) (Oral)   Wt 192 lb (87.1 kg)   SpO2 98%   BMI 36.28 kg/m   General: Appears herstated age, well developed, well nourished in NAD. HEENT: Head: normal shape and size, no sinus tenderness noted; Ears: Tm's red but ntact, normal light reflex, + serous effusion bilaterally; Nose: mucosa boggy and moist, turbinates swollen; Throat/Mouth: + PND. Teeth  present, mucosa pink and moist, no exudate noted, no lesions or ulcerations noted.  Neck:  No adenopathy noted.  Cardiovascular: Normal rate and rhythm. S1,S2 noted.   Pulmonary/Chest: Normal effort and positive vesicular breath sounds. No respiratory distress. No wheezes, rales or ronchi noted.       Assessment & Plan:   Viral Sinusitis  Can use a Neti Pot which can be purchased from your local drug store. Flonase 2 sprays each nostril for 3 days and then as needed. Start Allegra OTC 80 mg Depo IM today  RTC as needed or if symptoms persist. Webb Silversmith, NP

## 2018-10-26 ENCOUNTER — Telehealth: Payer: Self-pay | Admitting: Internal Medicine

## 2018-10-26 NOTE — Telephone Encounter (Signed)
Left message asking pt to calloffice.   Pt has appointment 11/21 with dr copland.  Dr copland will not be in office please see if pt would like to see another sports med dr.  Dr Clearance Coots @ [facility] or Dr Teresa Coombs @ [place].  If she only wants dr copland r/s to Jan

## 2018-10-27 NOTE — Telephone Encounter (Signed)
[facility] faxed note pt returning call and pt request cb [phone].

## 2018-10-28 NOTE — Telephone Encounter (Signed)
Appointment 1/13 pt aware

## 2018-10-29 ENCOUNTER — Encounter: Payer: Self-pay | Admitting: Internal Medicine

## 2018-10-29 ENCOUNTER — Ambulatory Visit (INDEPENDENT_AMBULATORY_CARE_PROVIDER_SITE_OTHER): Payer: Medicare HMO | Admitting: Internal Medicine

## 2018-10-29 VITALS — BP 124/80 | HR 60 | Temp 98.5°F | Wt 194.0 lb

## 2018-10-29 DIAGNOSIS — B9789 Other viral agents as the cause of diseases classified elsewhere: Secondary | ICD-10-CM | POA: Diagnosis not present

## 2018-10-29 DIAGNOSIS — J329 Chronic sinusitis, unspecified: Secondary | ICD-10-CM | POA: Diagnosis not present

## 2018-10-29 MED ORDER — AZITHROMYCIN 250 MG PO TABS: ORAL_TABLET | ORAL | 0 refills | Status: DC

## 2018-10-29 MED ORDER — PREDNISONE 10 MG PO TABS: ORAL_TABLET | ORAL | 0 refills | Status: DC

## 2018-10-29 NOTE — Patient Instructions (Signed)

## 2018-10-29 NOTE — Progress Notes (Signed)
HPI  Pt presents to the clinic today with c/o headache, post nasal drip and shortness of breath. She reports this started yesterday. She describes the headache as pressure in the forehead. She denies sore throat or difficulty swallowing. She denies fever, chills or body aches. She was seen 11/4 for similar complaint. She was given 80 mg Depo IM and advised to take Flonase and Allegra OTC. She reports symptoms improved up until yesterday. She has not tried anything additional OTC. She has a history of allergies and asthma. She has not had sick contacts.  Review of Systems     Past Medical History:  Diagnosis Date  . Allergy   . Arthritis   . Asthma   . Carpal tunnel syndrome 12/2017   left, referred to neurology for EMG testing  . 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/2017  . Chicken pox   . Depression   . Diverticulitis   . Diverticulosis   . Dysrhythmia    paroxysmal tachycardia  . GERD (gastroesophageal reflux disease)   . Hx of colonic polyps   . Hyperlipidemia   . Hypertension   . Insomnia   . 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 Sleep apnea    H/O   NO LONGER  . Syncope     Family History  Problem Relation Age of Onset  . Breast cancer Mother 76  . Arthritis Maternal Grandmother   . Diabetes Maternal Grandmother     Social History   Socioeconomic History  . Marital status: Single    Spouse name: Not on file  . Number of children: Not on file  . Years of education: Not on file  . Highest education level: Not on file  Occupational History  . Not on file  Social Needs  . Financial resource strain: Not on file  . Food insecurity:    Worry: Not on file    Inability: Not on file  . Transportation needs:    Medical: Not on file    Non-medical: Not on file  Tobacco Use  . Smoking status: Former Smoker    Packs/day: 0.50    Years: 7.00    Pack years: 3.50  . Smokeless tobacco: Never Used  . Tobacco comment: quit over 40 years ago  Substance and Sexual Activity  . Alcohol use: Yes     Comment: rare--liquor  . Drug use: No  . Sexual activity: Not Currently  Lifestyle  . Physical activity:    Days per week: Not on file    Minutes per session: Not on file  . Stress: Not on file  Relationships  . Social connections:    Talks on phone: Not on file    Gets together: Not on file    Attends religious service: Not on file    Active member of club or organization: Not on file    Attends meetings of clubs or organizations: Not on file    Relationship status: Not on file  . Intimate partner violence:    Fear of current or ex partner: Not on file    Emotionally abused: Not on file    Physically abused: Not on file    Forced sexual activity: Not on file  Other Topics Concern  . Not on file  Social History Narrative  . Not on file    Allergies  Allergen Reactions  . Avelox [Moxifloxacin Hcl In Nacl] Other (See Comments)    Chills  . Trazodone And Nefazodone Swelling  . Clarithromycin Other (See  Comments)    Unsure of reaction type     Constitutional: Positive headache Denies fatigue, fever or abrupt weight changes.  HEENT:  Positive post nasal drip. Denies eye redness, ear pain, ringing in the ears, wax buildup, runny nose or bloody nose. Respiratory: Positive shortness of breath. Denies difficulty breathing.  Cardiovascular: Denies chest pain, chest tightness, palpitations or swelling in the hands or feet.   No other specific complaints in a complete review of systems (except as listed in HPI above).  Objective:   BP 124/80   Pulse 60   Temp 98.5 F (36.9 C) (Oral)   Wt 194 lb (88 kg)   SpO2 98%   BMI 36.66 kg/m   General: Appears her stated age, in NAD. HEENT: Head: normal shape and size, no sinus tenderness noted; Ears: Tm's gray and intact, normal light reflex; Nose: mucosa boggy and moist, turbinates swollen; Throat/Mouth: + PND. Teeth present, mucosa pink and moist, no exudate noted, no lesions or ulcerations noted.  Neck:  No adenopathy noted.   Cardiovascular: Normal rate and rhythm.  Pulmonary/Chest: Normal effort and positive vesicular breath sounds. No respiratory distress. No wheezes, rales or ronchi noted.       Assessment & Plan:   Viral Sinusitis  Can use a Neti Pot which can be purchased from your local drug store. Continue Allegra and Flonase She really needs Prednisone but is hesitant to take this eRx for Azithromycin x 5 days (likely not bacterial- but advised her to try this for 2 days, if no improvement stop and start the Prednisone eRx for Pred Taper x 6 days  RTC as needed or if symptoms persist. Webb Silversmith, NP

## 2018-11-02 ENCOUNTER — Ambulatory Visit: Payer: Self-pay | Admitting: Family Medicine

## 2018-11-05 ENCOUNTER — Ambulatory Visit: Payer: Self-pay | Admitting: Family Medicine

## 2018-11-05 DIAGNOSIS — M25541 Pain in joints of right hand: Secondary | ICD-10-CM | POA: Diagnosis not present

## 2018-11-05 DIAGNOSIS — M25532 Pain in left wrist: Secondary | ICD-10-CM | POA: Diagnosis not present

## 2018-11-05 DIAGNOSIS — M25542 Pain in joints of left hand: Secondary | ICD-10-CM | POA: Diagnosis not present

## 2018-11-05 DIAGNOSIS — M25531 Pain in right wrist: Secondary | ICD-10-CM | POA: Diagnosis not present

## 2018-11-10 ENCOUNTER — Ambulatory Visit (INDEPENDENT_AMBULATORY_CARE_PROVIDER_SITE_OTHER): Payer: Medicare HMO

## 2018-11-10 ENCOUNTER — Ambulatory Visit: Payer: Medicare HMO | Admitting: Podiatry

## 2018-11-10 ENCOUNTER — Other Ambulatory Visit: Payer: Self-pay | Admitting: Podiatry

## 2018-11-10 DIAGNOSIS — M2042 Other hammer toe(s) (acquired), left foot: Secondary | ICD-10-CM

## 2018-11-10 DIAGNOSIS — M2011 Hallux valgus (acquired), right foot: Secondary | ICD-10-CM

## 2018-11-10 DIAGNOSIS — M2012 Hallux valgus (acquired), left foot: Secondary | ICD-10-CM

## 2018-11-10 DIAGNOSIS — M25532 Pain in left wrist: Secondary | ICD-10-CM | POA: Diagnosis not present

## 2018-11-10 DIAGNOSIS — M2041 Other hammer toe(s) (acquired), right foot: Secondary | ICD-10-CM

## 2018-11-10 DIAGNOSIS — M25531 Pain in right wrist: Secondary | ICD-10-CM | POA: Diagnosis not present

## 2018-11-10 DIAGNOSIS — M25542 Pain in joints of left hand: Secondary | ICD-10-CM | POA: Diagnosis not present

## 2018-11-10 DIAGNOSIS — M25541 Pain in joints of right hand: Secondary | ICD-10-CM | POA: Diagnosis not present

## 2018-11-11 NOTE — Progress Notes (Signed)
   Subjective: 77 year old female presenting today as a new patient with a chief complaint of painful bunions and hammertoes bilaterally. Walking and standing for long periods of time increases her pain. She has been using orthotics and wearing Vionic shoes for treatment. Patient is here for further evaluation and treatment.   Past Medical History:  Diagnosis Date  . Allergy   . Arthritis   . Asthma   . Carpal tunnel syndrome 12/2017   left, referred to neurology for EMG testing  . 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/2017  . Chicken pox   . Depression   . Diverticulitis   . Diverticulosis   . Dysrhythmia    paroxysmal tachycardia  . GERD (gastroesophageal reflux disease)   . Hx of colonic polyps   . Hyperlipidemia   . Hypertension   . Insomnia   . 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 Sleep apnea    H/O   NO LONGER  . Syncope      Objective: Physical Exam General: The patient is alert and oriented x3 in no acute distress.  Dermatology: Skin is cool, dry and supple bilateral lower extremities. Negative for open lesions or macerations.  Vascular: Palpable pedal pulses bilaterally. No edema or erythema noted. Capillary refill within normal limits.  Neurological: Epicritic and protective threshold grossly intact bilaterally.   Musculoskeletal Exam: Clinical evidence of bunion deformity noted to the respective foot. There is moderate pain on palpation range of motion of the first MPJ. Lateral deviation of the hallux noted consistent with hallux abductovalgus. Hammertoe contracture also noted on clinical exam to the 2nd digits of the bilateral feet. Symptomatic pain on palpation and range of motion also noted to the metatarsal phalangeal joints of the respective hammertoe digits.    Radiographic Exam: Increased intermetatarsal angle greater than 15 with a hallux abductus angle greater than 30 noted on AP view. Moderate degenerative changes noted within the first MPJ. Contracture deformity also noted to  the interphalangeal joints and MPJs of the digits of the respective hammertoes.    Assessment: 1. HAV w/ bunion deformity bilateral  2. 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 Patient was evaluated. X-Rays reviewed. 2. Continue wearing good shoe gear.  3. Continue taking OTC Motrin as needed.  4. Return to clinic as needed.    Edrick Kins, DPM [facility]  Dr. Edrick Kins, [address]                                        [place], [place] [address]                Office [phone]  Fax [phone]

## 2018-11-17 DIAGNOSIS — M25531 Pain in right wrist: Secondary | ICD-10-CM | POA: Diagnosis not present

## 2018-11-17 DIAGNOSIS — M25541 Pain in joints of right hand: Secondary | ICD-10-CM | POA: Diagnosis not present

## 2018-11-17 DIAGNOSIS — M25532 Pain in left wrist: Secondary | ICD-10-CM | POA: Diagnosis not present

## 2018-11-17 DIAGNOSIS — M25542 Pain in joints of left hand: Secondary | ICD-10-CM | POA: Diagnosis not present

## 2018-11-19 ENCOUNTER — Telehealth: Payer: Self-pay | Admitting: Internal Medicine

## 2018-11-19 ENCOUNTER — Other Ambulatory Visit: Payer: Self-pay | Admitting: *Deleted

## 2018-11-19 DIAGNOSIS — M479 Spondylosis, unspecified: Secondary | ICD-10-CM

## 2018-11-19 NOTE — Telephone Encounter (Signed)
Lmov for patient to call and schedule  Will try again at a later time

## 2018-11-19 NOTE — Telephone Encounter (Signed)
Pt is requesting renewed referral to [facility] for her back. She is also requesting written verification of her visits from 11/4 and 11/14. She enrolled in water aerobics classes and couldn't go due to being sick. They need written verification that she was sick to transfer classes to December.

## 2018-11-20 ENCOUNTER — Other Ambulatory Visit: Payer: Self-pay

## 2018-11-20 MED ORDER — ATENOLOL 50 MG PO TABS: 75.0000 mg | ORAL_TABLET | Freq: Every day | ORAL | 0 refills | Status: DC

## 2018-11-20 MED ORDER — HYDROCHLOROTHIAZIDE 25 MG PO TABS: 25.0000 mg | ORAL_TABLET | Freq: Every day | ORAL | 0 refills | Status: DC

## 2018-11-20 NOTE — Telephone Encounter (Signed)
I am not clear about this. She has been doing some PT for her hands and wrists.  Is she asking to go to PT for back pain? Can we clarify - low back pain?  Is she asking for a confirmation of her being sick for those dates or letter sent to PT?

## 2018-11-20 NOTE — Telephone Encounter (Signed)
Pt is seeing Dr Lorelei Pont and he is monitoring these conditions related to the need for PT

## 2018-11-23 DIAGNOSIS — H35373 Puckering of macula, bilateral: Secondary | ICD-10-CM | POA: Diagnosis not present

## 2018-11-23 NOTE — Telephone Encounter (Signed)
Spoke with Ms. Hett.  She states this message was for [facility].   She has graduated from the hand/wrist PT.  Rollene Fare is the one who has seen her for low back pain and would like a referral to [facility] so she can now work on her back.   She also needs a note about her illness which she also seen R. Baity for on 10/19/18 and 10/29/18 for her water aerobic class because she missed going due to being sick and they are allowing her to transfer her money for December classes but they need a verification note about her illness in order to do that.

## 2018-11-23 NOTE — Addendum Note (Signed)
Addended by: Jearld Fenton on: 11/23/2018 01:55 PM   Modules accepted: Orders

## 2018-11-23 NOTE — Telephone Encounter (Signed)
Referral to PT placed. Ok for note stating that she was sick on 11/4 and 11/14

## 2018-11-24 NOTE — Telephone Encounter (Signed)
No ans no vm   °

## 2018-11-24 NOTE — Telephone Encounter (Signed)
Spoke to pt and she is aware... Letter placed in front office for pick up

## 2018-11-26 ENCOUNTER — Telehealth: Payer: Self-pay | Admitting: Internal Medicine

## 2018-11-26 NOTE — Telephone Encounter (Signed)
Pt called office about a $50 no show fee she was charged. Pt called billing and they told her to call our office. Please advise

## 2018-11-30 NOTE — Telephone Encounter (Signed)
Best number [phone]  Pt called stating someone called her and wanted to know if that was you

## 2018-11-30 NOTE — Telephone Encounter (Signed)
I spoke with pt and she is aware I have sent a waiver request to charge correction per Dr Diona Browner as a 1 time courtesy.

## 2018-11-30 NOTE — Telephone Encounter (Signed)
Scheduled

## 2018-12-01 ENCOUNTER — Telehealth: Payer: Self-pay | Admitting: Internal Medicine

## 2018-12-01 NOTE — Telephone Encounter (Signed)
Patient returned Robin's call.  Patient can be reached at [phone].

## 2018-12-01 NOTE — Telephone Encounter (Signed)
Left message asking pt to call office regarding referral

## 2018-12-02 ENCOUNTER — Other Ambulatory Visit: Payer: Self-pay | Admitting: Cardiovascular Disease

## 2018-12-02 DIAGNOSIS — G5603 Carpal tunnel syndrome, bilateral upper limbs: Secondary | ICD-10-CM | POA: Diagnosis not present

## 2018-12-03 NOTE — Telephone Encounter (Signed)
Pt will call [facility] pt to schedule

## 2018-12-08 DIAGNOSIS — M479 Spondylosis, unspecified: Secondary | ICD-10-CM | POA: Diagnosis not present

## 2018-12-08 DIAGNOSIS — M545 Low back pain: Secondary | ICD-10-CM | POA: Diagnosis not present

## 2018-12-10 ENCOUNTER — Telehealth: Payer: Self-pay | Admitting: Cardiovascular Disease

## 2018-12-10 ENCOUNTER — Other Ambulatory Visit: Payer: Self-pay

## 2018-12-10 MED ORDER — LOSARTAN POTASSIUM 50 MG PO TABS: 50.0000 mg | ORAL_TABLET | Freq: Two times a day (BID) | ORAL | 0 refills | Status: DC

## 2018-12-10 NOTE — Telephone Encounter (Signed)
losartan (COZAAR) 50 MG tablet 60 tablet 0 12/10/2018    Sig - Route: Take 1 tablet (50 mg total) by mouth 2 (two) times daily. - Oral   Sent to pharmacy as: losartan (COZAAR) 50 MG tablet   Notes to Pharmacy: Needs to keep scheduled appointment.   E-Prescribing Status: Sent to pharmacy (12/10/2018 3:44 PM EST)   Pharmacy   [id number] Lorina Rabon, [place]

## 2018-12-10 NOTE — Telephone Encounter (Signed)
°*  STAT* If patient is at the pharmacy, call can be transferred to refill team.   1. Which medications need to be refilled? (please list name of each medication and dose if known) losartan 50 MG 1 tablet twice daily   2. Which pharmacy/location (including street and city if local pharmacy) is medication to be sent to? [facility] in [place]   3. Do they need a 30 day or 90 day supply? 30 day  Patient scheduled to see Dr. Rockey Situ on 1/13

## 2018-12-11 DIAGNOSIS — M479 Spondylosis, unspecified: Secondary | ICD-10-CM | POA: Diagnosis not present

## 2018-12-11 DIAGNOSIS — M545 Low back pain: Secondary | ICD-10-CM | POA: Diagnosis not present

## 2018-12-15 DIAGNOSIS — M545 Low back pain: Secondary | ICD-10-CM | POA: Diagnosis not present

## 2018-12-15 DIAGNOSIS — M479 Spondylosis, unspecified: Secondary | ICD-10-CM | POA: Diagnosis not present

## 2018-12-17 DIAGNOSIS — M545 Low back pain: Secondary | ICD-10-CM | POA: Diagnosis not present

## 2018-12-17 DIAGNOSIS — M479 Spondylosis, unspecified: Secondary | ICD-10-CM | POA: Diagnosis not present

## 2018-12-22 DIAGNOSIS — M545 Low back pain: Secondary | ICD-10-CM | POA: Diagnosis not present

## 2018-12-22 DIAGNOSIS — M479 Spondylosis, unspecified: Secondary | ICD-10-CM | POA: Diagnosis not present

## 2018-12-24 DIAGNOSIS — M545 Low back pain: Secondary | ICD-10-CM | POA: Diagnosis not present

## 2018-12-24 DIAGNOSIS — M479 Spondylosis, unspecified: Secondary | ICD-10-CM | POA: Diagnosis not present

## 2018-12-26 NOTE — Progress Notes (Signed)
Dr. Frederico Hamman T. Ranell Finelli, MD, [facility] [place], [address] Phone: [phone] Fax: [phone]  12/28/2018  Patient: Molly Faulkner, MRN: [id number], DOB: Dec 25, 1940, 78 y.o.  Primary Physician:  Jearld Fenton, NP   Chief Complaint  Patient presents with  . Follow-up    DeQuervains Tenosynvitis   Subjective:   Molly Faulkner is a 78 y.o. very pleasant female patient who presents with the following:  She is a well-known patient, and I have seen her for multiple things in the past including back pain.  Most recently I have been seeing her for some de Quervain's tenosynovitis as well as basal joint osteoarthritis and wrist osteoarthritis. I had her go do some OT work in the late fall.  She is doing quite a bit better, and her de Quervain's is calm down.  She thinks her occupational therapy helped quite a bit.  She also had 2 courses of prednisone in the month of December secondary to severe sinusitis, and this made her symptoms ultimately calm down.  She does still have some basal joint OA that flares up and bothers her off and on.  Back is reasonably stable, and she is doing some physical therapy.  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  09/23/2018 Last OV with Owens Loffler, MD  Dampened back some - sleeping in braces.  She is a well-known patient, and her hand and wrist pain overall is improved with the exception of her right de Quervain's tenosynovitis.  The CMC joints bilaterally are improved, but still sometimes symptomatic.  She does understand that she does have osteoarthritis.  She is using various topical liniments as well as some NSAIDs.  She is also taking some fish oil and using some braces intermittently.  R hand is more problematic - de q and in the [facility] joint arthritis. Got a brace from [place].  CTS mod, EMG proven ? Trigger   Cyst, dorsum 4th  finger.   [facility] - has been going for her back. [facility] at [facility]  08/05/2018 Last OV with Owens Loffler, MD  Pleasant 78 year old patient who is seen at the request of Mrs. Garnette Gunner for evaluation of bilateral wrist pain.  Has used some meloxicam and splints for her carpal tunnel.   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  She has pain at the [facility] joint bilaterally. This is at the first joint at the thumb.  She also has pain in the first dorsal compartment on the right side, this is significantly more than on the left.  She also has some fullness and decreased range of motion in her wrist and pain and aching in the range of motion at the wrist also. She also does have some intermittent carpal tunnel symptoms with some tingling.  DeQuarvain's - R worse than the left CMC OA bilaterally Wrist OA bilaterally  Past Medical History, Surgical History, Social History, Family History, Problem List, Medications, and Allergies have been reviewed and updated if relevant.  Patient Active Problem List   Diagnosis Date Noted  . Osteoarthritis of lower back 05/29/2018  . Chronic seasonal allergic rhinitis due to pollen 02/20/2017  . OSA on CPAP 07/01/2016  . Hyperlipidemia 02/21/2015  . Insomnia 07/25/2014  . HTN (hypertension) 07/25/2014  . GERD 07/25/2014  . Depression 07/25/2014  . Asthma, moderate persistent, well-controlled 07/25/2014    Past Medical History:  Diagnosis Date  .  Allergy   . Arthritis   . Asthma   . Carpal tunnel syndrome 12/2017   left, referred to neurology for EMG testing  . 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/2017  . Chicken pox   . Depression   . Diverticulitis   . Diverticulosis   . Dysrhythmia    paroxysmal tachycardia  . GERD (gastroesophageal reflux disease)   . Hx of colonic polyps   . Hyperlipidemia   . Hypertension   . Insomnia   . 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 Sleep apnea    H/O   NO LONGER  . Syncope     Past Surgical History:  Procedure  Laterality Date  . ABDOMINAL HYSTERECTOMY  1994   total  . BREAST BIOPSY Bilateral [id number]  . CARPAL TUNNEL RELEASE Bilateral   . CATARACT EXTRACTION W/PHACO Right 12/02/2017   Procedure: CATARACT EXTRACTION PHACO AND INTRAOCULAR LENS PLACEMENT (IOC);  Surgeon: Birder Robson, MD;  Location: [facility] ORS;  Service: Ophthalmology;  Laterality: Right;  [place] 00:36 AP% 11.5 CDE 4.21 Fluid pack lot # [id number]  . CATARACT EXTRACTION W/PHACO Left 12/30/2017   Procedure: CATARACT EXTRACTION PHACO AND INTRAOCULAR LENS PLACEMENT ([place]);  Surgeon: Birder Robson, MD;  Location: [facility] ORS;  Service: Ophthalmology;  Laterality: Left;  [place] 00:58 AP% 11.6 CDE 6.80 Fluid pack lot # [id number]  . COLONOSCOPY WITH PROPOFOL N/A 06/30/2018   Procedure: COLONOSCOPY WITH PROPOFOL;  Surgeon: Jonathon Bellows, MD;  Location: [facility]COPY;  Service: Gastroenterology;  Laterality: N/A;  . DILATION AND CURETTAGE OF UTERUS     X 2  . KNEE ARTHROSCOPY      Social History   Socioeconomic History  . Marital status: Single    Spouse name: Not on file  . Number of children: Not on file  . Years of education: Not on file  . Highest education level: Not on file  Occupational History  . Not on file  Social Needs  . Financial resource strain: Not on file  . Food insecurity:    Worry: Not on file    Inability: Not on file  . Transportation needs:    Medical: Not on file    Non-medical: Not on file  Tobacco Use  . Smoking status: Former Smoker    Packs/day: 0.50    Years: 7.00    Pack years: 3.50  . Smokeless tobacco: Never Used  . Tobacco comment: quit over 40 years ago  Substance and Sexual Activity  . Alcohol use: Yes    Comment: rare--liquor  . Drug use: No  . Sexual activity: Not Currently  Lifestyle  . Physical activity:    Days per week: Not on file    Minutes per session: Not on file  . Stress: Not on file  Relationships  . Social connections:    Talks on phone: Not on file    Gets together: Not  on file    Attends religious service: Not on file    Active member of club or organization: Not on file    Attends meetings of clubs or organizations: Not on file    Relationship status: Not on file  . Intimate partner violence:    Fear of current or ex partner: Not on file    Emotionally abused: Not on file    Physically abused: Not on file    Forced sexual activity: Not on file  Other Topics Concern  . Not on file  Social History Narrative  . Not on file  Family History  Problem Relation Age of Onset  . Breast cancer Mother 51  . Arthritis Maternal Grandmother   . Diabetes Maternal Grandmother     Allergies  Allergen Reactions  . Avelox [Moxifloxacin Hcl In Nacl] Other (See Comments)    Chills  . Trazodone And Nefazodone Swelling  . Clarithromycin Other (See Comments)    Unsure of reaction type    Medication list reviewed and updated in full in [place].  GEN: No fevers, chills. Nontoxic. Primarily MSK c/o today. MSK: Detailed in the HPI GI: tolerating PO intake without difficulty Neuro: No numbness, parasthesias, or tingling associated. Otherwise the pertinent positives of the ROS are noted above.   Objective:   BP 104/60   Pulse (!) 48   Temp 99.1 F (37.3 C) (Oral)   Ht 5\' 1"  (1.549 m)   Wt 188 lb 4 oz (85.4 kg)   BMI 35.57 kg/m    GEN: WDWN, NAD, Non-toxic, Alert & Oriented x 3 HEENT: Atraumatic, Normocephalic.  Ears and Nose: No external deformity. EXTR: No clubbing/cyanosis/edema NEURO: Normal gait.  PSYCH: Normally interactive. Conversant. Not depressed or anxious appearing.  Calm demeanor.    She does have some mild swelling at the [facility] joint on the left, and the [facility] joint on the left first is more tender to palpation compared to the right.  She also has some mild wrist swelling on the left.  Grip is preserved and no other focal bony or joint tenderness on the right or left hand.  Radiology: No results found.  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  She does continue to have some CMC joint arthritis, and she understand this is going to be lifelong.  She is done a good job working on hand arthritis rehab.  She also has some splints that she can even use intermittently.  She also is taking some supplements as well as some occasional ibuprofen 200 mg.  She is doing well and has a very good understanding of her pathology.  Follow-up: No follow-ups on file.  Signed,  Maud Deed. Oluwadarasimi Favor, MD   Outpatient Encounter Medications as of 12/28/2018  Medication Sig  . ADVAIR HFA 115-21 MCG/ACT inhaler INHALE 1 PUFF BY MOUTH TWICE DAILY  . albuterol (PROVENTIL HFA;VENTOLIN HFA) 108 (90 Base) MCG/ACT inhaler Inhale 2 puffs into the lungs every 6 (six) hours as needed for wheezing or shortness of breath.  [place] EX Apply 1 application topically 4 (four) times daily as needed (for arthritis pain.).  Marland Kitchen atenolol (TENORMIN) 50 MG tablet Take 1.5 tablets (75 mg total) by mouth daily.  . cholecalciferol (VITAMIN D) 1000 units tablet Take 1,000 Units by mouth every other day. At bedtime.  Marland Kitchen CREAM BASE EX Apply 1-2 application topically 4 (four) times daily as needed (for arthritis pain.). Penetrex Inflammation Formula (Arnica/Pyridoxine/MSM/Boswellia/Cetyl Myristoleate)  . hydrochlorothiazide (HYDRODIURIL) 25 MG tablet Take 1 tablet (25 mg total) by mouth daily.  Marland Kitchen ibuprofen (ADVIL,MOTRIN) 200 MG tablet Take 200 mg by mouth at bedtime as needed (for arthritis pain.).  Marland Kitchen Liniments (ORTHOGEL EX) Apply 1-2 application topically 4 (four) times daily as needed (for arthritis pain relief.).  Marland Kitchen losartan (COZAAR) 50 MG tablet Take 1 tablet (50 mg total) by mouth 2 (two) times daily.  . Magnesium Chloride POWD Apply 1 application topically 4 (four) times daily as  needed (for arthritis  pain.). Magnesium Oil with Arnica  . Menthol-Methyl Salicylate (PAIN RELIEVING) CREA Apply 1 application topically 4 (four) times daily as needed (for arthritis pain). EFAC PAIN RELIEVING CREAM  . mirtazapine (REMERON SOL-TAB) 15 MG disintegrating tablet Take 1 tablet (15 mg total) by mouth at bedtime.  . Misc Natural Products (LEG VEIN & CIRCULATION PO) Take 1 capsule by mouth every other day. In the morning. Jeannetta Nap Circulation and Vein Support  . Multiple Vitamins-Minerals (EYE SUPPORT PO) Take 1 tablet by mouth at bedtime.  . Nutritional Supplements (NUTRITIONAL SUPPLEMENT PO) Take 1 capsule by mouth every other day. In the morning. Jeannetta Nap Five Favorites (CoQ10-Alpha Lipoic Acid-Resveratrol-EGCG)  . [DISCONTINUED] Omega-3 Fatty Acids (FISH OIL) 1200 MG CAPS Take 1,290 mg by mouth every other day. In the morning   No facility-administered encounter medications on file as of 12/28/2018.

## 2018-12-27 NOTE — Progress Notes (Signed)
Maybe I will go to gym Cardiology Office Note  Date:  12/28/2018   ID:  Molly Faulkner, DOB 06-19-1941, MRN [id number]  PCP:  Jearld Fenton, NP   Chief Complaint  Patient presents with  . Other    past due follow up 12 month follow up. Patietn denies chest pain and SOB.  patient was last seen 06/20/17. Meds reviewed verbally with patient.     HPI:  Molly Faulkner is a very pleasant 78 year-old woman with history of  hypertension,  syncope,  tachycardia  OSA on CPAP Cardiac workup in 2011 at [place], echo and stress test who presents for follow-up of her hypertension and tachycardia  In follow-up today she reports that she has changed her diet, trying to be more active She has been losing weight Recent visit to [facility] orthopedics blood pressure running low Visit today blood pressure low and heart rate running low Denies any orthostasis symptoms  No episodes of syncope or near syncope  Continues to take losartan atenolol and HCTZ No lower extremity edema  Normal echo in 06/2017  EKG personally reviewed by myself on todays visit Shows sinus bradycardia rate 47 bpm no significant ST or T wave changes  Other past medical history reviewed CXR 03/2017,  flu Cardiomegaly. No definite active infiltrates or failure. Bibasilar scarring versus subsegmental atelectasis.   sister with dementia passed away , she was primary caretaker    February 2016 she was on a plane going to [place]. She had not drink much fluids, had a breakfast bar and a bottle of water. She had a queasy feeling in her stomach while on the plane, she got up the window seat, moved into the asle, when she developed lightheadedness, dizziness and acute syncope. She feels that she was only out for several seconds before she came to on the ground. She sat there for several minutes until she felt better, sat and had some juice for several minutes, felt better and then went to the bathroom in the back of the plane. Rest of the  trip was uneventful.  10 days later approximately she woke up with tachycardia, February 09, 2015, measured her heart rate at 110 bpm, blood pressure also elevated. She felt general malaise, felt weird She rested and eventually after over one hour, heart rate came down, blood pressure came down. She went to see primary care and on arrival to their office, she reports that her palpitations resolved. EKG at that time showed heart rate in the 60s.   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  PSH:    Past Surgical History:  Procedure Laterality Date  . ABDOMINAL HYSTERECTOMY  1994   total  . BREAST BIOPSY Bilateral [id number]  . CARPAL TUNNEL RELEASE Bilateral   . CATARACT EXTRACTION W/PHACO Right 12/02/2017   Procedure: CATARACT EXTRACTION PHACO AND INTRAOCULAR LENS PLACEMENT (IOC);  Surgeon: Birder Robson, MD;  Location: [facility] ORS;  Service: Ophthalmology;  Laterality: Right;  [place] 00:36 AP% 11.5 CDE 4.21 Fluid pack lot # [id number]  . CATARACT EXTRACTION W/PHACO Left 12/30/2017   Procedure: CATARACT EXTRACTION PHACO AND INTRAOCULAR LENS PLACEMENT ([place]);  Surgeon: Birder Robson, MD;  Location: [facility] ORS;  Service: Ophthalmology;  Laterality: Left;  [place] 00:58 AP% 11.6 CDE 6.80 Fluid pack lot #  [id number]  . COLONOSCOPY WITH PROPOFOL N/A 06/30/2018   Procedure: COLONOSCOPY WITH PROPOFOL;  Surgeon: Jonathon Bellows, MD;  Location[facility] ENDOSCOPY;  Service: Gastroenterology;  Laterality: N/A;  . DILATION AND CURETTAGE OF UTERUS     X 2  . KNEE ARTHROSCOPY      Current Outpatient Medications  Medication Sig Dispense Refill  . ADVAIR HFA 115-21 MCG/ACT inhaler INHALE 1 PUFF BY MOUTH TWICE DAILY  12 g 1  . albuterol (PROVENTIL HFA;VENTOLIN HFA) 108 (90 Base) MCG/ACT inhaler Inhale 2 puffs into the lungs every 6 (six) hours as needed for wheezing or shortness of breath. 1 Inhaler 2  . ARNICA EX Apply 1 application topically 4 (four) times daily as needed (for arthritis pain.).    Marland Kitchen atenolol (TENORMIN) 50 MG tablet Take 1.5 tablets (75 mg total) by mouth daily. 135 tablet 0  . cholecalciferol (VITAMIN D) 1000 units tablet Take 1,000 Units by mouth every other day. At bedtime.    Marland Kitchen CREAM BASE EX Apply 1-2 application topically 4 (four) times daily as needed (for arthritis pain.). Penetrex Inflammation Formula (Arnica/Pyridoxine/MSM/Boswellia/Cetyl Myristoleate)    . hydrochlorothiazide (HYDRODIURIL) 25 MG tablet Take 1 tablet (25 mg total) by mouth daily. 90 tablet 0  . ibuprofen (ADVIL,MOTRIN) 200 MG tablet Take 200 mg by mouth at bedtime as needed (for arthritis pain.).    Marland Kitchen Liniments (ORTHOGEL EX) Apply 1-2 application topically 4 (four) times daily as needed (for arthritis pain relief.).    Marland Kitchen losartan (COZAAR) 50 MG tablet Take 1 tablet (50 mg total) by mouth 2 (two) times daily. 60 tablet 0  . Magnesium Chloride POWD Apply 1 application topically 4 (four) times daily as needed (for arthritis pain.). Magnesium Oil with Arnica    . Menthol-Methyl Salicylate (PAIN RELIEVING) CREA Apply 1 application topically 4 (four) times daily as needed (for arthritis pain). EFAC PAIN RELIEVING CREAM    . mirtazapine (REMERON SOL-TAB) 15 MG disintegrating tablet Take 1 tablet (15 mg total) by mouth at bedtime. 20 tablet 0  . Misc Natural Products (LEG VEIN & CIRCULATION PO) Take 1 capsule by mouth every other day. In the morning. Jeannetta Nap Circulation and Vein Support    . Multiple Vitamins-Minerals (EYE SUPPORT PO) Take 1 tablet by mouth at bedtime.    . Nutritional Supplements (NUTRITIONAL SUPPLEMENT PO) Take 1 capsule by mouth every other day. In the morning. Jeannetta Nap Five Favorites  (CoQ10-Alpha Lipoic Acid-Resveratrol-EGCG)     No current facility-administered medications for this visit.      Allergies:   Avelox [moxifloxacin hcl in nacl]; Trazodone and nefazodone; and Clarithromycin   Social History:  The patient  reports that she has quit smoking. She has a 3.50 pack-year smoking history. She has never used smokeless tobacco. She reports current alcohol use. She reports that she does not use drugs.   Family History:   family history includes Arthritis in her maternal grandmother; Breast cancer (age of onset: 49) in her mother; Diabetes in her maternal grandmother.    Review of Systems: Review of Systems  Constitutional: Negative.   Respiratory: Negative.   Cardiovascular: Negative.   Gastrointestinal: Negative.   Musculoskeletal: Negative.   Neurological: Negative.   Psychiatric/Behavioral: Negative.   All other systems reviewed and are negative.    PHYSICAL EXAM: VS:  BP 110/60 (BP Location: Left Arm, Patient Position: Sitting, Cuff Size: Normal)   Pulse (!) 47   Ht 5\' 1"  (1.549 m)   Wt 188 lb (85.3 kg)  BMI 35.52 kg/m  , BMI Body mass index is 35.52 kg/m. Constitutional:  oriented to person, place, and time. No distress.  HENT:  Head: Grossly normal Eyes:  no discharge. No scleral icterus.  Neck: No JVD, no carotid bruits  Cardiovascular: Regular rate and rhythm, no murmurs appreciated Pulmonary/Chest: Clear to auscultation bilaterally, no wheezes or rails Abdominal: Soft.  no distension.  no tenderness.  Musculoskeletal: Normal range of motion Neurological:  normal muscle tone. Coordination normal. No atrophy Skin: Skin warm and dry Psychiatric: normal affect, pleasant  Recent Labs: 05/29/2018: ALT 15; BUN 20; Creat 0.99; Hemoglobin 13.3; Platelets 209; Potassium 3.8; Sodium 142    Lipid Panel Lab Results  Component Value Date   CHOL 183 05/29/2018   HDL 48 (L) 05/29/2018   LDLCALC 110 (H) 05/29/2018   TRIG 140 05/29/2018       Wt Readings from Last 3 Encounters:  12/28/18 188 lb (85.3 kg)  12/28/18 188 lb 4 oz (85.4 kg)  10/29/18 194 lb (88 kg)      ASSESSMENT AND PLAN:   Essential hypertension - Plan: EKG 12-Lead Blood pressure running low, also with low heart rate Recommend she decrease atenolol down to 25 mg daily from 75 Decrease losartan down to 50 from 100  Pure hypercholesterolemia Reasonable numbers, recommend she continue her weight loss Start a low impact exercise program  OSA  On CPAP She will discuss with pulmonary if settings need to be changed for further weight loss  Sinus bradycardia Rate 47 on today's visit Recommend she decrease atenolol down to 25 mg daily   Total encounter time more than 25 minutes  Greater than 50% was spent in counseling and coordination of care with the patient   Disposition:   F/U  12 months     Orders Placed This Encounter  Procedures  . EKG 12-Lead     Signed, Esmond Plants, M.D., Ph.D. 12/28/2018  [place], [place]

## 2018-12-28 ENCOUNTER — Ambulatory Visit (INDEPENDENT_AMBULATORY_CARE_PROVIDER_SITE_OTHER): Payer: Medicare HMO | Admitting: Family Medicine

## 2018-12-28 ENCOUNTER — Encounter: Payer: Self-pay | Admitting: Cardiovascular Disease

## 2018-12-28 ENCOUNTER — Ambulatory Visit: Payer: Medicare HMO | Admitting: Cardiovascular Disease

## 2018-12-28 ENCOUNTER — Encounter: Payer: Self-pay | Admitting: Family Medicine

## 2018-12-28 VITALS — BP 110/60 | HR 47 | Ht 61.0 in | Wt 188.0 lb

## 2018-12-28 VITALS — BP 104/60 | HR 48 | Temp 99.1°F | Ht 61.0 in | Wt 188.2 lb

## 2018-12-28 DIAGNOSIS — I479 Paroxysmal tachycardia, unspecified: Secondary | ICD-10-CM | POA: Diagnosis not present

## 2018-12-28 DIAGNOSIS — M19031 Primary osteoarthritis, right wrist: Secondary | ICD-10-CM

## 2018-12-28 DIAGNOSIS — M19032 Primary osteoarthritis, left wrist: Secondary | ICD-10-CM | POA: Diagnosis not present

## 2018-12-28 DIAGNOSIS — E782 Mixed hyperlipidemia: Secondary | ICD-10-CM

## 2018-12-28 DIAGNOSIS — M654 Radial styloid tenosynovitis [de Quervain]: Secondary | ICD-10-CM

## 2018-12-28 DIAGNOSIS — M180 Bilateral primary osteoarthritis of first carpometacarpal joints: Secondary | ICD-10-CM

## 2018-12-28 DIAGNOSIS — I10 Essential (primary) hypertension: Secondary | ICD-10-CM

## 2018-12-28 DIAGNOSIS — R55 Syncope and collapse: Secondary | ICD-10-CM | POA: Diagnosis not present

## 2018-12-28 MED ORDER — ATENOLOL 25 MG PO TABS: ORAL_TABLET | ORAL | 3 refills | Status: DC

## 2018-12-28 MED ORDER — ATENOLOL 50 MG PO TABS: ORAL_TABLET | ORAL | 3 refills | Status: DC

## 2018-12-28 MED ORDER — LOSARTAN POTASSIUM 50 MG PO TABS: ORAL_TABLET | ORAL | 3 refills | Status: DC

## 2018-12-28 NOTE — Patient Instructions (Addendum)
Medication Instructions:   Please decrease the atenolol down to 25 mg daily Please decrease the losartan down to 50 mg daily  If you need a refill on your cardiac medications before your next appointment, please call your pharmacy.    Lab work: No new labs needed   If you have labs (blood work) drawn today and your tests are completely normal, you will receive your results only by: Marland Kitchen MyChart Message (if you have MyChart) OR . A paper copy in the mail If you have any lab test that is abnormal or we need to change your treatment, we will call you to review the results.   Testing/Procedures: No new testing needed   Follow-Up: At [facility], you and your health needs are our priority.  As part of our continuing mission to provide you with exceptional heart care, we have created designated Provider Care Teams.  These Care Teams include your primary Cardiologist (physician) and Advanced Practice Providers (APPs -  Physician Assistants and Nurse Practitioners) who all work together to provide you with the care you need, when you need it.  . You will need a follow up appointment in 12 months .   Please call our office 2 months in advance to schedule this appointment.    . Providers on your designated Care Team:   . Murray Hodgkins, NP . Christell Faith, PA-C . Marrianne Mood, PA-C  Any Other Special Instructions Will Be Listed Below (If Applicable).  For educational health videos Log in to : [email or web address] Or : [email or web address], password : [email or web address]

## 2018-12-29 DIAGNOSIS — M479 Spondylosis, unspecified: Secondary | ICD-10-CM | POA: Diagnosis not present

## 2018-12-29 DIAGNOSIS — M545 Low back pain: Secondary | ICD-10-CM | POA: Diagnosis not present

## 2018-12-31 DIAGNOSIS — M545 Low back pain: Secondary | ICD-10-CM | POA: Diagnosis not present

## 2018-12-31 DIAGNOSIS — M479 Spondylosis, unspecified: Secondary | ICD-10-CM | POA: Diagnosis not present

## 2019-01-01 ENCOUNTER — Telehealth: Payer: Self-pay | Admitting: Internal Medicine

## 2019-01-01 NOTE — Telephone Encounter (Signed)
Pt need refill for   Advair HFA 115  Sent to [facility]

## 2019-01-04 MED ORDER — FLUTICASONE-SALMETEROL 115-21 MCG/ACT IN AERO: 1.0000 | INHALATION_SPRAY | Freq: Two times a day (BID) | RESPIRATORY_TRACT | 0 refills | Status: DC

## 2019-01-05 DIAGNOSIS — M545 Low back pain: Secondary | ICD-10-CM | POA: Diagnosis not present

## 2019-01-05 DIAGNOSIS — M479 Spondylosis, unspecified: Secondary | ICD-10-CM | POA: Diagnosis not present

## 2019-01-06 DIAGNOSIS — G4733 Obstructive sleep apnea (adult) (pediatric): Secondary | ICD-10-CM | POA: Diagnosis not present

## 2019-01-07 DIAGNOSIS — M479 Spondylosis, unspecified: Secondary | ICD-10-CM | POA: Diagnosis not present

## 2019-01-07 DIAGNOSIS — M545 Low back pain: Secondary | ICD-10-CM | POA: Diagnosis not present

## 2019-01-12 DIAGNOSIS — M479 Spondylosis, unspecified: Secondary | ICD-10-CM | POA: Diagnosis not present

## 2019-01-12 DIAGNOSIS — M545 Low back pain: Secondary | ICD-10-CM | POA: Diagnosis not present

## 2019-01-14 DIAGNOSIS — M479 Spondylosis, unspecified: Secondary | ICD-10-CM | POA: Diagnosis not present

## 2019-01-14 DIAGNOSIS — M545 Low back pain: Secondary | ICD-10-CM | POA: Diagnosis not present

## 2019-01-19 DIAGNOSIS — M545 Low back pain: Secondary | ICD-10-CM | POA: Diagnosis not present

## 2019-01-19 DIAGNOSIS — M479 Spondylosis, unspecified: Secondary | ICD-10-CM | POA: Diagnosis not present

## 2019-03-05 ENCOUNTER — Other Ambulatory Visit: Payer: Self-pay | Admitting: Internal Medicine

## 2019-04-07 DIAGNOSIS — G4733 Obstructive sleep apnea (adult) (pediatric): Secondary | ICD-10-CM | POA: Diagnosis not present

## 2019-05-03 ENCOUNTER — Other Ambulatory Visit: Payer: Self-pay | Admitting: Internal Medicine

## 2019-05-19 ENCOUNTER — Other Ambulatory Visit: Payer: Self-pay | Admitting: Internal Medicine

## 2019-05-19 DIAGNOSIS — Z1231 Encounter for screening mammogram for malignant neoplasm of breast: Secondary | ICD-10-CM

## 2019-06-07 DIAGNOSIS — G5603 Carpal tunnel syndrome, bilateral upper limbs: Secondary | ICD-10-CM | POA: Diagnosis not present

## 2019-06-09 ENCOUNTER — Other Ambulatory Visit: Payer: Self-pay | Admitting: Internal Medicine

## 2019-07-12 DIAGNOSIS — G4733 Obstructive sleep apnea (adult) (pediatric): Secondary | ICD-10-CM | POA: Diagnosis not present

## 2019-08-05 ENCOUNTER — Ambulatory Visit
Admission: RE | Admit: 2019-08-05 | Discharge: 2019-08-05 | Disposition: A | Discharge status: Home / Self Care | Payer: Medicare HMO | Source: Ambulatory Visit | Attending: Internal Medicine | Admitting: Internal Medicine

## 2019-08-05 ENCOUNTER — Other Ambulatory Visit: Payer: Self-pay

## 2019-08-05 DIAGNOSIS — Z1231 Encounter for screening mammogram for malignant neoplasm of breast: Secondary | ICD-10-CM | POA: Insufficient documentation

## 2019-09-02 ENCOUNTER — Encounter: Payer: Medicare HMO | Admitting: Internal Medicine

## 2019-09-07 DIAGNOSIS — G5603 Carpal tunnel syndrome, bilateral upper limbs: Secondary | ICD-10-CM | POA: Diagnosis not present

## 2019-09-07 DIAGNOSIS — G8929 Other chronic pain: Secondary | ICD-10-CM | POA: Diagnosis not present

## 2019-09-07 DIAGNOSIS — R262 Difficulty in walking, not elsewhere classified: Secondary | ICD-10-CM | POA: Diagnosis not present

## 2019-09-07 DIAGNOSIS — M545 Low back pain: Secondary | ICD-10-CM | POA: Diagnosis not present

## 2019-09-10 ENCOUNTER — Other Ambulatory Visit: Payer: Self-pay | Admitting: Neurology

## 2019-09-10 DIAGNOSIS — G8929 Other chronic pain: Secondary | ICD-10-CM

## 2019-09-10 DIAGNOSIS — M545 Low back pain, unspecified: Secondary | ICD-10-CM

## 2019-09-14 ENCOUNTER — Ambulatory Visit (INDEPENDENT_AMBULATORY_CARE_PROVIDER_SITE_OTHER): Payer: Medicare HMO

## 2019-09-14 DIAGNOSIS — Z23 Encounter for immunization: Secondary | ICD-10-CM

## 2019-09-22 ENCOUNTER — Ambulatory Visit: Payer: Medicare HMO | Admitting: Internal Medicine

## 2019-09-22 ENCOUNTER — Other Ambulatory Visit: Payer: Self-pay

## 2019-09-22 ENCOUNTER — Encounter: Payer: Self-pay | Admitting: Internal Medicine

## 2019-09-22 VITALS — BP 126/74 | HR 58 | Temp 97.7°F | Ht 61.0 in | Wt 182.0 lb

## 2019-09-22 DIAGNOSIS — J4520 Mild intermittent asthma, uncomplicated: Secondary | ICD-10-CM | POA: Diagnosis not present

## 2019-09-22 DIAGNOSIS — G4733 Obstructive sleep apnea (adult) (pediatric): Secondary | ICD-10-CM

## 2019-09-22 MED ORDER — ALBUTEROL SULFATE HFA 108 (90 BASE) MCG/ACT IN AERS: 2.0000 | INHALATION_SPRAY | Freq: Four times a day (QID) | RESPIRATORY_TRACT | 6 refills | Status: DC | PRN

## 2019-09-22 MED ORDER — ADVAIR HFA 115-21 MCG/ACT IN AERO: INHALATION_SPRAY | RESPIRATORY_TRACT | 0 refills | Status: DC

## 2019-09-22 NOTE — Patient Instructions (Signed)
Continue CPAP as prescribed Continue inhalers as prescribed Avoid allergens   Asthma, Adult  Asthma is a long-term (chronic) condition in which the airways get tight and narrow. The airways are the breathing passages that lead from the nose and mouth down into the lungs. A person with asthma will have times when symptoms get worse. These are called asthma attacks. They can cause coughing, whistling sounds when you breathe (wheezing), shortness of breath, and chest pain. They can make it hard to breathe. There is no cure for asthma, but medicines and lifestyle changes can help control it. There are many things that can bring on an asthma attack or make asthma symptoms worse (triggers). Common triggers include:  Mold.  Dust.  Cigarette smoke.  Cockroaches.  Things that can cause allergy symptoms (allergens). These include animal skin flakes (dander) and pollen from trees or grass.  Things that pollute the air. These may include household cleaners, wood smoke, smog, or chemical odors.  Cold air, weather changes, and wind.  Crying or laughing hard.  Stress.  Certain medicines or drugs.  Certain foods such as dried fruit, potato chips, and grape juice.  Infections, such as a cold or the flu.  Certain medical conditions or diseases.  Exercise or tiring activities. Asthma may be treated with medicines and by staying away from the things that cause asthma attacks. Types of medicines may include:  Controller medicines. These help prevent asthma symptoms. They are usually taken every day.  Fast-acting reliever or rescue medicines. These quickly relieve asthma symptoms. They are used as needed and provide short-term relief.  Allergy medicines if your attacks are brought on by allergens.  Medicines to help control the body's defense (immune) system. Follow these instructions at home: Avoiding triggers in your home  Change your heating and air conditioning filter often.  Limit  your use of fireplaces and wood stoves.  Get rid of pests (such as roaches and mice) and their droppings.  Throw away plants if you see mold on them.  Clean your floors. Dust regularly. Use cleaning products that do not smell.  Have someone vacuum when you are not home. Use a vacuum cleaner with a HEPA filter if possible.  Replace carpet with wood, tile, or vinyl flooring. Carpet can trap animal skin flakes and dust.  Use allergy-proof pillows, mattress covers, and box spring covers.  Wash bed sheets and blankets every week in hot water. Dry them in a dryer.  Keep your bedroom free of any triggers.  Avoid pets and keep windows closed when things that cause allergy symptoms are in the air.  Use blankets that are made of polyester or cotton.  Clean bathrooms and kitchens with bleach. If possible, have someone repaint the walls in these rooms with mold-resistant paint. Keep out of the rooms that are being cleaned and painted.  Wash your hands often with soap and water. If soap and water are not available, use hand sanitizer.  Do not allow anyone to smoke in your home. General instructions  Take over-the-counter and prescription medicines only as told by your doctor. ? Talk with your doctor if you have questions about how or when to take your medicines. ? Make note if you need to use your medicines more often than usual.  Do not use any products that contain nicotine or tobacco, such as cigarettes and e-cigarettes. If you need help quitting, ask your doctor.  Stay away from secondhand smoke.  Avoid doing things outdoors when allergen counts are  high and when air quality is low.  Wear a ski mask when doing outdoor activities in the winter. The mask should cover your nose and mouth. Exercise indoors on cold days if you can.  Warm up before you exercise. Take time to cool down after exercise.  Use a peak flow meter as told by your doctor. A peak flow meter is a tool that measures  how well the lungs are working.  Keep track of the peak flow meter's readings. Write them down.  Follow your asthma action plan. This is a written plan for taking care of your asthma and treating your attacks.  Make sure you get all the shots (vaccines) that your doctor recommends. Ask your doctor about a flu shot and a pneumonia shot.  Keep all follow-up visits as told by your doctor. This is important. Contact a doctor if:  You have wheezing, shortness of breath, or a cough even while taking medicine to prevent attacks.  The mucus you cough up (sputum) is thicker than usual.  The mucus you cough up changes from clear or white to yellow, green, gray, or bloody.  You have problems from the medicine you are taking, such as: ? A rash. ? Itching. ? Swelling. ? Trouble breathing.  You need reliever medicines more than 2-3 times a week.  Your peak flow reading is still at 50-79% of your personal best after following the action plan for 1 hour.  You have a fever. Get help right away if:  You seem to be worse and are not responding to medicine during an asthma attack.  You are short of breath even at rest.  You get short of breath when doing very little activity.  You have trouble eating, drinking, or talking.  You have chest pain or tightness.  You have a fast heartbeat.  Your lips or fingernails start to turn blue.  You are light-headed or dizzy, or you faint.  Your peak flow is less than 50% of your personal best.  You feel too tired to breathe normally. Summary  Asthma is a long-term (chronic) condition in which the airways get tight and narrow. An asthma attack can make it hard to breathe.  Asthma cannot be cured, but medicines and lifestyle changes can help control it.  Make sure you understand how to avoid triggers and how and when to use your medicines. This information is not intended to replace advice given to you by your health care provider. Make sure you  discuss any questions you have with your health care provider. Document Released: 05/20/2008 Document Revised: 02/04/2019 Document Reviewed: 01/06/2017 Elsevier Patient Education  2020 [employer].

## 2019-09-22 NOTE — Progress Notes (Signed)
  [facility] Consultation    Date: 09/22/2019  MRN# [id number] Molly Faulkner 1941-01-24  Referring Physician: NP Molly Faulkner PMD - NP Molly Faulkner is a 78 y.o. old female seen in consultation for sleep apnea evaluation and ASTHMA  CC:  Follow-up asthma Follow-up OSA  HPI:  At this time asthma seems to be under control Uses Advair daily Albuterol as needed No exacerbation at this time No signs of infection Patient does have allergies to cats and dogs  No evidence of exacerbation at this time No evidence of heart failure at this time No evidence or signs of infection at this time No respiratory distress No fevers, chills, nausea, vomiting, diarrhea No evidence of lower extremity edema No evidence hemoptysis   Follow-up OSA Tolerating CPAP 10 cm of water pressure Uses nasal pillows 100% compliant for days and hours  Stays active No acute issues  Stays active No acute issues     Allergies:  Avelox [moxifloxacin hcl in nacl], Trazodone and nefazodone, and Clarithromycin  Review of Systems  Constitutional: Negative for chills, diaphoresis, fever and malaise/fatigue.  HENT: Negative for hearing loss.   Eyes: Negative for blurred vision and double vision.  Respiratory: Negative for cough, hemoptysis, sputum production, shortness of breath and wheezing.   Cardiovascular: Negative for chest pain.  Gastrointestinal: Negative for heartburn, nausea and vomiting.  Genitourinary: Negative for dysuria.  Musculoskeletal: Negative for myalgias.  Skin: Negative for rash.  Neurological: Negative for dizziness.  Endo/Heme/Allergies: Negative for environmental allergies. Does not bruise/bleed easily.  All other systems reviewed and are negative.   There were no vitals taken for this visit.  Physical Examination:   GENERAL:NAD, no fevers, chills, no weakness no fatigue HEAD: Normocephalic, atraumatic.  EYES: PERLA, EOMI No scleral icterus.  NECK:  Supple. No thyromegaly.  No JVD.  PULMONARY: CTA B/L no wheezing, rhonchi, crackles CARDIOVASCULAR: S1 and S2. Regular rate and rhythm. No murmurs GASTROINTESTINAL: Soft, nontender, nondistended. Positive bowel sounds.  MUSCULOSKELETAL: No swelling, clubbing, or edema.  NEUROLOGIC: No gross focal neurological deficits. 5/5 strength all extremities SKIN: No ulceration, lesions, rashes, or cyanosis.  PSYCHIATRIC: Insight, judgment intact. -depression -anxiety ALL OTHER ROS ARE NEGATIVE         Assessment and Plan: Asthma mild intermittent and well controlled at this time Avoid allergens Avoid cats and dogs Continue Advair as prescribed Albuterol as needed  OSA Continue CPAP as prescribed Patient uses and benefits from therapy  Allergic rhinitis Continue Nasacort and antihistamines as needed    Obesity -recommend significant weight loss -recommend changing diet  Deconditioned state -Recommend increased daily activity and exercise    COVID-19 EDUCATION: The signs and symptoms of COVID-19 were discussed with the patient and how to seek care for testing.  The importance of social distancing was discussed today. Hand Washing Techniques and avoid touching face was advised.     MEDICATION ADJUSTMENTS/LABS AND TESTS ORDERED:  Continue CPAP as prescribed Continue inhalers as prescribed Avoid allergens  FLU SHOT IS UP TO DATE!  CURRENT MEDICATIONS REVIEWED AT LENGTH WITH PATIENT TODAY   Patient satisfied with Plan of action and management. All questions answered  Follow up in 1 year   Tommy Goostree Patricia Pesa, M.D.  [facility]  Medical Director [place] Director [facility]

## 2019-09-23 ENCOUNTER — Ambulatory Visit
Admission: RE | Admit: 2019-09-23 | Discharge: 2019-09-23 | Disposition: A | Discharge status: Home / Self Care | Payer: Medicare HMO | Source: Ambulatory Visit | Attending: Neurology | Admitting: Neurology

## 2019-09-23 DIAGNOSIS — M545 Low back pain: Secondary | ICD-10-CM | POA: Diagnosis not present

## 2019-09-23 DIAGNOSIS — G8929 Other chronic pain: Secondary | ICD-10-CM | POA: Diagnosis not present

## 2019-09-28 NOTE — Progress Notes (Signed)
Molly Vest T. [name], MD Primary Care and [place] at [facility] [name], [address] Phone: [phone]  FAX: [place] - 78 y.o. female  MRN [id number]  Date of Birth: 1941-09-29  Visit Date: 09/29/2019  PCP: Jearld Fenton, NP  Referred by: Jearld Fenton, NP  Chief Complaint  Patient presents with  . Thumb Pain    Left   Subjective:   GRABRIELA Faulkner is a 78 y.o. very pleasant female patient with Body mass index is 34.44 kg/m. who presents with the following:  She is a nice lady who I have seen multiple times for dequervain's as well as basal joint OA on the left side.  I was saw her 12/2018.  She is scheduled for an EMG next week with Dr. Melrose Nakayama.  She is having some significant carpal tunnel syndrome on the left side as well as pain at the basal joint.  Past Medical History, Surgical History, Social History, Family History, Problem List, Medications, and Allergies have been reviewed and updated if relevant.  Patient Active Problem List   Diagnosis Date Noted  . Osteoarthritis of lower back 05/29/2018  . Chronic seasonal allergic rhinitis due to pollen 02/20/2017  . OSA on CPAP 07/01/2016  . Hyperlipidemia 02/21/2015  . Insomnia 07/25/2014  . HTN (hypertension) 07/25/2014  . GERD 07/25/2014  . Depression 07/25/2014  . Asthma, moderate persistent, well-controlled 07/25/2014    Past Medical History:  Diagnosis Date  . Allergy   . Arthritis   . Asthma   . Carpal tunnel syndrome 12/2017   left, referred to neurology for EMG testing  . 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/2017  . Chicken pox   . Depression   . Diverticulitis   . Diverticulosis   . Dysrhythmia    paroxysmal tachycardia  . GERD (gastroesophageal reflux disease)   . Hx of colonic polyps   . Hyperlipidemia   . Hypertension   . Insomnia   . 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 Sleep apnea    H/O   NO LONGER  . Syncope     Past Surgical  History:  Procedure Laterality Date  . ABDOMINAL HYSTERECTOMY  1994   total  . BREAST BIOPSY Bilateral [id number]  . CARPAL TUNNEL RELEASE Bilateral   . CATARACT EXTRACTION W/PHACO Right 12/02/2017   Procedure: CATARACT EXTRACTION PHACO AND INTRAOCULAR LENS PLACEMENT (IOC);  Surgeon: Birder Robson, MD;  Location: [facility] ORS;  Service: Ophthalmology;  Laterality: Right;  [place] 00:36 AP% 11.5 CDE 4.21 Fluid pack lot # [id number]  . CATARACT EXTRACTION W/PHACO Left 12/30/2017   Procedure: CATARACT EXTRACTION PHACO AND INTRAOCULAR LENS PLACEMENT ([place]);  Surgeon: Birder Robson, MD;  Location: [facility] ORS;  Service: Ophthalmology;  Laterality: Left;  [place] 00:58 AP% 11.6 CDE 6.80 Fluid pack lot # [id number]  . COLONOSCOPY WITH PROPOFOL N/A 06/30/2018   Procedure: COLONOSCOPY WITH PROPOFOL;  Surgeon: Jonathon Bellows, MD;  Location[facility] ENDOSCOPY;  Service: Gastroenterology;  Laterality: N/A;  . DILATION AND CURETTAGE OF UTERUS     X 2  . KNEE ARTHROSCOPY      Social History   Socioeconomic History  . Marital status: Single    Spouse name: Not on file  . Number of children: Not on file  . Years of education: Not on file  . Highest education level: Not on file  Occupational History  . Not on file  Social Needs  . Financial resource strain:  Not on file  . Food insecurity    Worry: Not on file    Inability: Not on file  . Transportation needs    Medical: Not on file    Non-medical: Not on file  Tobacco Use  . Smoking status: Former Smoker    Packs/day: 0.50    Years: 7.00    Pack years: 3.50  . Smokeless tobacco: Never Used  . Tobacco comment: quit over 40 years ago  Substance and Sexual Activity  . Alcohol use: Yes    Comment: rare--liquor  . Drug use: No  . Sexual activity: Not Currently  Lifestyle  . Physical activity    Days per week: Not on file    Minutes per session: Not on file  . Stress: Not on file  Relationships  . Social Herbalist on phone: Not on file     Gets together: Not on file    Attends religious service: Not on file    Active member of club or organization: Not on file    Attends meetings of clubs or organizations: Not on file    Relationship status: Not on file  . Intimate partner violence    Fear of current or ex partner: Not on file    Emotionally abused: Not on file    Physically abused: Not on file    Forced sexual activity: Not on file  Other Topics Concern  . Not on file  Social History Narrative  . Not on file    Family History  Problem Relation Age of Onset  . Breast cancer Mother 36  . Arthritis Maternal Grandmother   . Diabetes Maternal Grandmother     Allergies  Allergen Reactions  . Avelox [Moxifloxacin Hcl In Nacl] Other (See Comments)    Chills  . Trazodone And Nefazodone Swelling  . Clarithromycin Other (See Comments)    Unsure of reaction type    Medication list reviewed and updated in full in [place].  GEN: no acute illness or fever CV: No chest pain or shortness of breath MSK: detailed above Neuro: neurological signs are described above ROS O/w per HPI  Objective:   BP 124/70   Pulse 65   Temp 98.4 F (36.9 C) (Temporal)   Ht 5\' 1"  (1.549 m)   Wt 182 lb 4 oz (82.7 kg)   SpO2 98%   BMI 34.44 kg/m    GEN: WDWN, NAD, Non-toxic, Alert & Oriented x 3 HEENT: Atraumatic, Normocephalic.  Ears and Nose: No external deformity. EXTR: No clubbing/cyanosis/edema NEURO: Normal gait.  PSYCH: Normally interactive. Conversant. Not depressed or anxious appearing.  Calm demeanor.   Full range of motion at the wrist, axial loading is negative and the grip is 5 out of 5.  She does have significant tenderness at the [facility] joint on the left, but no other significant CMC, MCP, or other finger joint pain.  Radiology:  Assessment and Plan:     ICD-10-CM   1. Primary osteoarthritis of first carpometacarpal joint of left hand  M18.12    At this point continue with conservative care.  She does  have an upcoming EMG, median nerve adjacent to the [facility] joint, some hesitant to inject that today.  She can follow-up.  Follow-up: No follow-ups on file.  No orders of the defined types were placed in this encounter.  No orders of the defined types were placed in this encounter.   Signed,  Maud Deed. Anner Baity, MD  Outpatient Encounter Medications as of 09/29/2019  Medication Sig  . albuterol (VENTOLIN HFA) 108 (90 Base) MCG/ACT inhaler Inhale 2 puffs into the lungs every 6 (six) hours as needed for wheezing or shortness of breath.  Rodman Key EX Apply 1 application topically 4 (four) times daily as needed (for arthritis pain.).  Marland Kitchen atenolol (TENORMIN) 25 MG tablet Take 1 tablet (25 mg) by mouth once daily  . cholecalciferol (VITAMIN D) 1000 units tablet Take 1,000 Units by mouth every other day. At bedtime.  Marland Kitchen CREAM BASE EX Apply 1-2 application topically 4 (four) times daily as needed (for arthritis pain.). Penetrex Inflammation Formula (Arnica/Pyridoxine/MSM/Boswellia/Cetyl Myristoleate)  . fluticasone-salmeterol (ADVAIR HFA) 115-21 MCG/ACT inhaler INHALE 1 PUFF INTO THE LUNGS TWICE DAILY  . GLUCOSAMINE-CHONDROITIN PO Take 1 tablet by mouth 3 (three) times daily. 1500-1200 mg  . hydrochlorothiazide (HYDRODIURIL) 25 MG tablet TAKE 1 TABLET EVERY DAY  . ibuprofen (ADVIL,MOTRIN) 200 MG tablet Take 200 mg by mouth at bedtime as needed (for arthritis pain.).  Marland Kitchen Liniments (ORTHOGEL EX) Apply 1-2 application topically 4 (four) times daily as needed (for arthritis pain relief.).  Marland Kitchen losartan (COZAAR) 50 MG tablet Take 1 tablet (50 mg) by mouth once daily  . Magnesium Chloride POWD Apply 1 application topically 4 (four) times daily as needed (for arthritis pain.). Magnesium Oil with Arnica  . Menthol-Methyl Salicylate (PAIN RELIEVING) CREA Apply 1 application topically 4 (four) times daily as needed (for arthritis pain). EFAC PAIN RELIEVING CREAM  . mirtazapine (REMERON SOL-TAB) 15 MG disintegrating  tablet Take 1 tablet (15 mg total) by mouth at bedtime.  . Misc Natural Products (LEG VEIN & CIRCULATION PO) Take 1 capsule by mouth every other day. In the morning. Jeannetta Nap Circulation and Vein Support  . Multiple Vitamins-Minerals (EYE SUPPORT PO) Take 1 tablet by mouth at bedtime.  . Nutritional Supplements (NUTRITIONAL SUPPLEMENT PO) Take 1 capsule by mouth every other day. In the morning. Jeannetta Nap Five Favorites (CoQ10-Alpha Lipoic Acid-Resveratrol-EGCG)   No facility-administered encounter medications on file as of 09/29/2019.

## 2019-09-29 ENCOUNTER — Ambulatory Visit (INDEPENDENT_AMBULATORY_CARE_PROVIDER_SITE_OTHER): Payer: Medicare HMO | Admitting: Family Medicine

## 2019-09-29 ENCOUNTER — Other Ambulatory Visit: Payer: Self-pay

## 2019-09-29 ENCOUNTER — Encounter: Payer: Self-pay | Admitting: Family Medicine

## 2019-09-29 VITALS — BP 124/70 | HR 65 | Temp 98.4°F | Ht 61.0 in | Wt 182.2 lb

## 2019-09-29 DIAGNOSIS — M1812 Unilateral primary osteoarthritis of first carpometacarpal joint, left hand: Secondary | ICD-10-CM

## 2019-10-04 ENCOUNTER — Other Ambulatory Visit: Payer: Self-pay | Admitting: Internal Medicine

## 2019-10-05 ENCOUNTER — Encounter: Payer: Self-pay | Admitting: Internal Medicine

## 2019-10-05 ENCOUNTER — Ambulatory Visit (INDEPENDENT_AMBULATORY_CARE_PROVIDER_SITE_OTHER): Payer: Medicare HMO | Admitting: Internal Medicine

## 2019-10-05 ENCOUNTER — Other Ambulatory Visit: Payer: Self-pay

## 2019-10-05 VITALS — BP 128/76 | HR 57 | Temp 97.3°F | Ht 61.0 in | Wt 182.0 lb

## 2019-10-05 DIAGNOSIS — K219 Gastro-esophageal reflux disease without esophagitis: Secondary | ICD-10-CM | POA: Diagnosis not present

## 2019-10-05 DIAGNOSIS — I10 Essential (primary) hypertension: Secondary | ICD-10-CM | POA: Diagnosis not present

## 2019-10-05 DIAGNOSIS — J4540 Moderate persistent asthma, uncomplicated: Secondary | ICD-10-CM

## 2019-10-05 DIAGNOSIS — E559 Vitamin D deficiency, unspecified: Secondary | ICD-10-CM | POA: Diagnosis not present

## 2019-10-05 DIAGNOSIS — M21961 Unspecified acquired deformity of right lower leg: Secondary | ICD-10-CM

## 2019-10-05 DIAGNOSIS — M479 Spondylosis, unspecified: Secondary | ICD-10-CM | POA: Diagnosis not present

## 2019-10-05 DIAGNOSIS — F329 Major depressive disorder, single episode, unspecified: Secondary | ICD-10-CM

## 2019-10-05 DIAGNOSIS — G4733 Obstructive sleep apnea (adult) (pediatric): Secondary | ICD-10-CM

## 2019-10-05 DIAGNOSIS — G5600 Carpal tunnel syndrome, unspecified upper limb: Secondary | ICD-10-CM | POA: Insufficient documentation

## 2019-10-05 DIAGNOSIS — Z0000 Encounter for general adult medical examination without abnormal findings: Secondary | ICD-10-CM

## 2019-10-05 DIAGNOSIS — E782 Mixed hyperlipidemia: Secondary | ICD-10-CM | POA: Diagnosis not present

## 2019-10-05 DIAGNOSIS — G5603 Carpal tunnel syndrome, bilateral upper limbs: Secondary | ICD-10-CM

## 2019-10-05 DIAGNOSIS — Z9989 Dependence on other enabling machines and devices: Secondary | ICD-10-CM

## 2019-10-05 MED ORDER — HYDROCODONE-ACETAMINOPHEN 5-325 MG PO TABS: 1.0000 | ORAL_TABLET | Freq: Every day | ORAL | 0 refills | Status: DC | PRN

## 2019-10-05 MED ORDER — MIRTAZAPINE 15 MG PO TBDP: 15.0000 mg | ORAL_TABLET | Freq: Every day | ORAL | 0 refills | Status: DC

## 2019-10-05 NOTE — Assessment & Plan Note (Signed)
Controlled on Losartan, HCTZ and Atenolol Reinforced DASH diet and exercise for weight loss CMET today

## 2019-10-05 NOTE — Patient Instructions (Signed)

## 2019-10-05 NOTE — Assessment & Plan Note (Signed)
Currently in remission Will monitor

## 2019-10-05 NOTE — Progress Notes (Signed)
HPI:  Pt presents to th clinic today for her subsequent annual Medicare Wellness Exam. She is also due to follow up chronic conditions.  OA: Mainly in her hands and back. She recently had a MRI of her lumbar spine. She goes back to the orthopedist in 1 month to discuss the results. She takes Ibuprofen as needed with minimal relief. She has recently started taking Glucosamine Chondroitin and CBD oil with some relief.  She takes Hydrocodone as needed for severe pain and would like a refill of this today. The pain is starting to interfere with her daily functioning and sleep.  Asthma: Moderate, persistent. Managed on Advair and Albuterol. She denies chronic cough or SOB. There are no PFT's on file. She follows with Dr. Mortimer Fries.  Carpal Tunnel Syndrome: L>R. She has an EMG scheduled for tomorrow. She is following with neurology and plans to have a left carpal tunnel release in the near future.  Depression: Currently not an issue. She is not taking any medication for this. She has a therapist that she sees intermittently, but has not needed to see her recently. She denies anxiety, SI/HI.  GERD: Triggered by acidic foods. She takes Tums as needed with good relief of symptoms. There is no upper GI on file.  HLD: Her last LDL was 110, 05/2018. She is no longer taking Fish Oil but is trying to consume a low fat diet.  HTN: Her BP today is 128/76. She is taking Losartan, HCTZ and Atenolol as prescribed. ECG from 03/2017 reviewed.  OSA: She sleeps well with the use of her CPAP. She occasionally takes Mirtazapine on nights she has trouble falling asleep. Sleep study from 11/2016 reviewed.  She also c/o a bump to the back of her right heel. She reports about 3 months ago, she got bit by a mosquito. Since that time, she has had this bump and pain with ambulation. She takes Ibuprofen with some relief but rest seems to help it the most.  Past Medical History:  Diagnosis Date  . Allergy   . Arthritis   . Asthma    . Carpal tunnel syndrome 12/2017   left, referred to neurology for EMG testing  . 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/2017  . Chicken pox   . Depression   . Diverticulitis   . Diverticulosis   . Dysrhythmia    paroxysmal tachycardia  . GERD (gastroesophageal reflux disease)   . Hx of colonic polyps   . Hyperlipidemia   . Hypertension   . Insomnia   . 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 Sleep apnea    H/O   NO LONGER  . Syncope     Current Outpatient Medications  Medication Sig Dispense Refill  . albuterol (VENTOLIN HFA) 108 (90 Base) MCG/ACT inhaler Inhale 2 puffs into the lungs every 6 (six) hours as needed for wheezing or shortness of breath. 1 g 6  . ARNICA EX Apply 1 application topically 4 (four) times daily as needed (for arthritis pain.).    Marland Kitchen atenolol (TENORMIN) 25 MG tablet Take 1 tablet (25 mg) by mouth once daily 90 tablet 3  . cholecalciferol (VITAMIN D) 1000 units tablet Take 1,000 Units by mouth every other day. At bedtime.    Marland Kitchen CREAM BASE EX Apply 1-2 application topically 4 (four) times daily as needed (for arthritis pain.). Penetrex Inflammation Formula (Arnica/Pyridoxine/MSM/Boswellia/Cetyl Myristoleate)    . fluticasone-salmeterol (ADVAIR HFA) 115-21 MCG/ACT inhaler INHALE 1 PUFF INTO THE LUNGS TWICE DAILY 12 g 0  .  GLUCOSAMINE-CHONDROITIN PO Take 1 tablet by mouth 3 (three) times daily. 1500-1200 mg    . hydrochlorothiazide (HYDRODIURIL) 25 MG tablet TAKE 1 TABLET EVERY DAY 90 tablet 0  . ibuprofen (ADVIL,MOTRIN) 200 MG tablet Take 200 mg by mouth at bedtime as needed (for arthritis pain.).    Marland Kitchen Liniments (ORTHOGEL EX) Apply 1-2 application topically 4 (four) times daily as needed (for arthritis pain relief.).    Marland Kitchen losartan (COZAAR) 50 MG tablet Take 1 tablet (50 mg) by mouth once daily 90 tablet 3  . Magnesium Chloride POWD Apply 1 application topically 4 (four) times daily as needed (for arthritis pain.). Magnesium Oil with Arnica    . Menthol-Methyl Salicylate (PAIN RELIEVING)  CREA Apply 1 application topically 4 (four) times daily as needed (for arthritis pain). EFAC PAIN RELIEVING CREAM    . mirtazapine (REMERON SOL-TAB) 15 MG disintegrating tablet Take 1 tablet (15 mg total) by mouth at bedtime. 20 tablet 0  . Misc Natural Products (LEG VEIN & CIRCULATION PO) Take 1 capsule by mouth every other day. In the morning. Jeannetta Nap Circulation and Vein Support    . Multiple Vitamins-Minerals (EYE SUPPORT PO) Take 1 tablet by mouth at bedtime.    . Nutritional Supplements (NUTRITIONAL SUPPLEMENT PO) Take 1 capsule by mouth every other day. In the morning. Jeannetta Nap Five Favorites (CoQ10-Alpha Lipoic Acid-Resveratrol-EGCG)     No current facility-administered medications for this visit.     Allergies  Allergen Reactions  . Avelox [Moxifloxacin Hcl In Nacl] Other (See Comments)    Chills  . Trazodone And Nefazodone Swelling  . Clarithromycin Other (See Comments)    Unsure of reaction type    Family History  Problem Relation Age of Onset  . Breast cancer Mother 6  . Arthritis Maternal Grandmother   . Diabetes Maternal Grandmother     Social History   Socioeconomic History  . Marital status: Single    Spouse name: Not on file  . Number of children: Not on file  . Years of education: Not on file  . Highest education level: Not on file  Occupational History  . Not on file  Social Needs  . Financial resource strain: Not on file  . Food insecurity    Worry: Not on file    Inability: Not on file  . Transportation needs    Medical: Not on file    Non-medical: Not on file  Tobacco Use  . Smoking status: Former Smoker    Packs/day: 0.50    Years: 7.00    Pack years: 3.50  . Smokeless tobacco: Never Used  . Tobacco comment: quit over 40 years ago  Substance and Sexual Activity  . Alcohol use: Yes    Comment: rare--liquor  . Drug use: No  . Sexual activity: Not Currently  Lifestyle  . Physical activity    Days per week: Not on file     Minutes per session: Not on file  . Stress: Not on file  Relationships  . Social Herbalist on phone: Not on file    Gets together: Not on file    Attends religious service: Not on file    Active member of club or organization: Not on file    Attends meetings of clubs or organizations: Not on file    Relationship status: Not on file  . Intimate partner violence    Fear of current or ex partner: Not on file    Emotionally abused: Not  on file    Physically abused: Not on file    Forced sexual activity: Not on file  Other Topics Concern  . Not on file  Social History Narrative  . Not on file    Hospitiliaztions: None  Health Maintenance:    Flu: 08/2019  Tetanus: 12/2011  Pneumovax: 01/2012  Prevnar: 02/2016  Zostavax: 12/2011  Shingrix: never  Mammogram: 07/2019  Pap Smear: no longer screening  Bone Density: 04/2017  Colon Screening: 06/2018, 3 years  Eye Doctor: annually  Dental Exam: binnually   Providers:   PCP: Webb Silversmith, NP-C  Neurologist: Dr. Melrose Nakayama  Cardiologist: Dr. Rockey Situ  Podiatry: Dr. Amalia Hailey  Gastroenterologist: Dr. Vicente Males  Pulmonologist: Dr. Mortimer Fries   I have personally reviewed and have noted:  1. The patient's medical and social history 2. Their use of alcohol, tobacco or illicit drugs 3. Their current medications and supplements 4. The patient's functional ability including ADL's, fall risks, home safety risks and hearing or visual impairment. 5. Diet and physical activities 6. Evidence for depression or mood disorder  Subjective:   Review of Systems:   Constitutional: Denies fever, malaise, fatigue, headache or abrupt weight changes.  HEENT: Denies eye pain, eye redness, ear pain, ringing in the ears, wax buildup, runny nose, nasal congestion, bloody nose, or sore throat. Respiratory: Denies difficulty breathing, shortness of breath, cough or sputum production.   Cardiovascular: Denies chest pain, chest tightness, palpitations or swelling in  the hands or feet.  Gastrointestinal: Pt reports intermittent reflux. Denies abdominal pain, bloating, constipation, diarrhea or blood in the stool.  GU: Denies urgency, frequency, pain with urination, burning sensation, blood in urine, odor or discharge. Musculoskeletal: Pt reports joint pain in hands, back. Denies decrease in range of motion, difficulty with gait, muscle pain or joint swelling.  Skin: Denies redness, rashes, lesions or ulcercations.  Neurological: Denies dizziness, difficulty with memory, difficulty with speech or problems with balance and coordination.  Psych: Pt has a history of depression. Denies anxiety, SI/HI.   No other specific complaints in a complete review of systems (except as listed in HPI above).  Objective:  PE:   BP 128/76   Pulse (!) 57   Temp (!) 97.3 F (36.3 C) (Temporal)   Ht 5\' 1"  (1.549 m)   Wt 182 lb (82.6 kg)   SpO2 98%   BMI 34.39 kg/m   Wt Readings from Last 3 Encounters:  09/29/19 182 lb 4 oz (82.7 kg)  09/22/19 182 lb (82.6 kg)  12/28/18 188 lb (85.3 kg)    General: Appears her stated age, obese, in NAD. Skin: Warm, dry and intact. No rashes noted. HEENT: Head: normal shape and size; Eyes: sclera white, no icterus, conjunctiva pink, PERRLA and EOMs intact;  Neck: Neck supple, trachea midline. No masses, lumps or thyromegaly present.  Cardiovascular: Normal rate and rhythm. S1,S2 noted.  No murmur, rubs or gallops noted. No JVD or BLE edema. No carotid bruits noted. Pulmonary/Chest: Normal effort and positive vesicular breath sounds. No respiratory distress. No wheezes, rales or ronchi noted.  Abdomen: Soft and nontender. Normal bowel sounds. No distention or masses noted. Liver, spleen and kidneys non palpable. Musculoskeletal: Strength 5/5 BUE/BLE. Bony prominence noted of right posterior heel. No difficulty with gait. Neurological: Alert and oriented. Cranial nerves II-XII grossly intact. Coordination normal.  Psychiatric: Mood  and affect normal. Behavior is normal. Judgment and thought content normal.     BMET    Component Value Date/Time   NA 142 05/29/2018 1547  NA 140 02/21/2015 1236   K 3.8 05/29/2018 1547   CL 106 05/29/2018 1547   CO2 28 05/29/2018 1547   GLUCOSE 108 (H) 05/29/2018 1547   BUN 20 05/29/2018 1547   BUN 14 02/21/2015 1236   CREATININE 0.99 (H) 05/29/2018 1547   CALCIUM 9.7 05/29/2018 1547   GFRNONAA 59 (L) 04/11/2017 1352   GFRAA >60 04/11/2017 1352    Lipid Panel     Component Value Date/Time   CHOL 183 05/29/2018 1547   TRIG 140 05/29/2018 1547   HDL 48 (L) 05/29/2018 1547   CHOLHDL 3.8 05/29/2018 1547   VLDL 14.8 02/20/2017 1416   LDLCALC 110 (H) 05/29/2018 1547    CBC    Component Value Date/Time   WBC 7.0 05/29/2018 1547   RBC 4.00 05/29/2018 1547   HGB 13.3 05/29/2018 1547   HCT 37.1 05/29/2018 1547   PLT 209 05/29/2018 1547   MCV 92.8 05/29/2018 1547   MCH 33.3 (H) 05/29/2018 1547   MCHC 35.8 05/29/2018 1547   RDW 12.5 05/29/2018 1547    Hgb A1C Lab Results  Component Value Date   HGBA1C 5.1 02/20/2017      Assessment and Plan:   Medicare Annual Wellness Visit:  Diet: She does eat meat. She consumes more veggies than fruits. She tries to avoid fried foods. She drinks mostly coffee, hot tea and water. Physical activity: Sedentary Depression/mood screen: Negative, PHQ 9 score of 1 Hearing: Intact to whispered voice Visual acuity: Grossly normal, performs annual eye exam  ADLs: Capable Fall risk: None Home safety: Good Cognitive evaluation: Intact to orientation, naming, recall and repetition EOL planning: Adv directives, full code/ I agree  Preventative Medicine: Flu, tetanus, pneumovax, prevnar and zostovax UTD. She will think about the shingrix vaccine. Mammogram, bone density and colon screening UTD. She no longer wants to screen for cervical cancer. Encouraged her to consume a balanced diet and exercise regimen. Advised her to see an eye  doctor and dentist annually. Will check CBC, CMET, Lipid and Vit D today. Due dates for screening exams given to patient as part of her AVS  Mass of Right Heel:  Likely not related to mosquito bite Concern for bone spur She declines xray at this time Will monitor for now  Next appointment: 6 months, follow up chronic conditions   Webb Silversmith, NP

## 2019-10-05 NOTE — Assessment & Plan Note (Signed)
CMET and Lipid profile today Encouraged her to consume a low fat diet 

## 2019-10-05 NOTE — Assessment & Plan Note (Signed)
Continue CPAP and Mirtazapine prn Discussed how weight loss could help improve reflux symptoms She will continue to follow with pulmonoloy

## 2019-10-05 NOTE — Assessment & Plan Note (Signed)
Discussed avoiding foods that trigger your reflux Discussed how weight loss could help reduce reflux symptoms  Will monitor

## 2019-10-05 NOTE — Assessment & Plan Note (Signed)
Continue Advair and Albuterol She will continue to follow with pulmonology

## 2019-10-05 NOTE — Assessment & Plan Note (Signed)
Advised her to stretch and get regular physical exercise Discussed how weight loss could help reduce joint pain We discussed Celebrex, but she does not want to try this at this time She will follow up with orthopedics in 1 month

## 2019-10-05 NOTE — Assessment & Plan Note (Signed)
EMG scheduled for tomorrow She will continue to follow with neurology

## 2019-10-06 LAB — COMPREHENSIVE METABOLIC PANEL
ALT: 12 U/L (ref 0–35)
AST: 12 U/L (ref 0–37)
Albumin: 4.2 g/dL (ref 3.5–5.2)
Alkaline Phosphatase: 73 U/L (ref 39–117)
BUN: 15 mg/dL (ref 6–23)
CO2: 30 mEq/L (ref 19–32)
Calcium: 9.9 mg/dL (ref 8.4–10.5)
Chloride: 102 mEq/L (ref 96–112)
Creatinine, Ser: 0.9 mg/dL (ref 0.40–1.20)
GFR: 73.59 mL/min (ref >60.00)
Glucose, Bld: 88 mg/dL (ref 70–99)
Potassium: 3.8 mEq/L (ref 3.5–5.1)
Sodium: 138 mEq/L (ref 135–145)
Total Bilirubin: 0.9 mg/dL (ref 0.2–1.2)
Total Protein: 7 g/dL (ref 6.0–8.3)

## 2019-10-06 LAB — CBC
HCT: 36.9 % (ref 36.0–46.0)
Hemoglobin: 13 g/dL (ref 12.0–15.0)
MCHC: 35.2 g/dL (ref 30.0–36.0)
MCV: 93.3 fl (ref 78.0–100.0)
Platelets: 212 10*3/uL (ref 150.0–400.0)
RBC: 3.96 Mil/uL (ref 3.87–5.11)
RDW: 12.7 % (ref 11.5–15.5)
WBC: 6.3 10*3/uL (ref 4.0–10.5)

## 2019-10-06 LAB — LIPID PANEL
Cholesterol: 185 mg/dL (ref 0–200)
HDL: 48.2 mg/dL (ref >39.00)
LDL Cholesterol: 109 mg/dL — ABNORMAL HIGH (ref 0–99)
NonHDL: 136.36
Total CHOL/HDL Ratio: 4
Triglycerides: 136 mg/dL (ref 0.0–149.0)
VLDL: 27.2 mg/dL (ref 0.0–40.0)

## 2019-10-06 LAB — VITAMIN D 25 HYDROXY (VIT D DEFICIENCY, FRACTURES): VITD: 36.45 ng/mL (ref 30.00–100.00)

## 2019-10-11 DIAGNOSIS — M542 Cervicalgia: Secondary | ICD-10-CM | POA: Diagnosis not present

## 2019-10-11 DIAGNOSIS — R200 Anesthesia of skin: Secondary | ICD-10-CM | POA: Diagnosis not present

## 2019-10-12 ENCOUNTER — Other Ambulatory Visit: Payer: Self-pay | Admitting: Internal Medicine

## 2019-10-13 NOTE — Telephone Encounter (Signed)
Patient called. Patient said when she went to pick up medication at [facility] it was too expensive, so she cancelled the rx and called [facility] to get rx.  Patient said [facility] wanted $30 and it will hardly cost her anything with [facility].

## 2019-11-19 DIAGNOSIS — G4733 Obstructive sleep apnea (adult) (pediatric): Secondary | ICD-10-CM | POA: Diagnosis not present

## 2019-12-21 ENCOUNTER — Telehealth: Payer: Self-pay | Admitting: Cardiovascular Disease

## 2019-12-21 NOTE — Telephone Encounter (Signed)
°*  STAT* If patient is at the pharmacy, call can be transferred to refill team.   1. Which medications need to be refilled? (please list name of each medication and dose if known) HCTZ 12.5 mg po BID   2. Which pharmacy/location (including street and city if local pharmacy) is medication to be sent to? [facility]   3. Do they need a 30 day or 90 day supply? 90    pcp wants [facility] to send and handle all bp card meds

## 2019-12-21 NOTE — Telephone Encounter (Signed)
Please advise.  PCP prescribed.  Medication list states HCTZ 25MG  daily.  Patient is requesting 12.5 BID.

## 2019-12-21 NOTE — Telephone Encounter (Signed)
Spoke with patient and reviewed that prescription was being filled by Webb Silversmith NP and she should reach out to them for refill for that. I do not see where Dr. Rockey Situ has ever ordered that. She verbalized understanding with no further questions.

## 2019-12-28 ENCOUNTER — Other Ambulatory Visit: Payer: Self-pay | Admitting: Internal Medicine

## 2020-01-04 ENCOUNTER — Telehealth: Payer: Self-pay | Admitting: Cardiovascular Disease

## 2020-01-04 NOTE — Telephone Encounter (Signed)
Pt c/o BP issue: STAT if pt c/o blurred vision, one-sided weakness or slurred speech  1. What are your last 5 BP readings? 135/78 170/85   2. Are you having any other symptoms (ex. Dizziness, headache, blurred vision, passed out)? Lightheaded intermittent   3. What is your BP issue? Patient wants to klnow if she should change med dose or schedule an appt.

## 2020-01-06 NOTE — Telephone Encounter (Signed)
Spoke with patient and she reports that blood pressures have gone back down. Instructed her to please monitor blood pressures 2 hours after medications and keep log of readings, also requested that she bring in machine to her appointment so we can compare to our readings. She verbalized understanding of instructions and confirmed appointment for Monday.

## 2020-01-06 NOTE — Telephone Encounter (Signed)
No answer/No voicemail on home number, Left voicemail message on mobile number with instructions to call back.

## 2020-01-06 NOTE — Patient Instructions (Signed)
How to Take Your Blood Pressure You can take your blood pressure at home with a machine. You may need to check your blood pressure at home:  To check if you have high blood pressure (hypertension).  To check your blood pressure over time.  To make sure your blood pressure medicine is working. Supplies needed: You will need a blood pressure machine, or monitor. You can buy one at a drugstore or online. When choosing one:  Choose one with an arm cuff.  Choose one that wraps around your upper arm. Only one finger should fit between your arm and the cuff.  Do not choose one that measures your blood pressure from your wrist or finger. Your doctor can suggest a monitor. How to prepare Avoid these things for 30 minutes before checking your blood pressure:  Drinking caffeine.  Drinking alcohol.  Eating.  Smoking.  Exercising. Five minutes before checking your blood pressure:  Pee.  Sit in a dining chair. Avoid sitting in a soft couch or armchair.  Be quiet. Do not talk. How to take your blood pressure Follow the instructions that came with your machine. If you have a digital blood pressure monitor, these may be the instructions: 1. Sit up straight. 2. Place your feet on the floor. Do not cross your ankles or legs. 3. Rest your left arm at the level of your heart. You may rest it on a table, desk, or chair. 4. Pull up your shirt sleeve. 5. Wrap the blood pressure cuff around the upper part of your left arm. The cuff should be 1 inch (2.5 cm) above your elbow. It is best to wrap the cuff around bare skin. 6. Fit the cuff snugly around your arm. You should be able to place only one finger between the cuff and your arm. 7. Put the cord inside the groove of your elbow. 8. Press the power button. 9. Sit quietly while the cuff fills with air and loses air. 10. Write down the numbers on the screen. 11. Wait 2-3 minutes and then repeat steps 1-10. What do the numbers mean? Two  numbers make up your blood pressure. The first number is called systolic pressure. The second is called diastolic pressure. An example of a blood pressure reading is "120 over 80" (or 120/80). If you are an adult and do not have a medical condition, use this guide to find out if your blood pressure is normal: Normal  First number: below 120.  Second number: below 80. Elevated  First number: 120-129.  Second number: below 80. Hypertension stage 1  First number: 130-139.  Second number: 80-89. Hypertension stage 2  First number: 140 or above.  Second number: 90 or above. Your blood pressure is above normal even if only the top or bottom number is above normal. Follow these instructions at home:  Check your blood pressure as often as your doctor tells you to.  Take your monitor to your next doctor's appointment. Your doctor will: ? Make sure you are using it correctly. ? Make sure it is working right.  Make sure you understand what your blood pressure numbers should be.  Tell your doctor if your medicines are causing side effects. Contact a doctor if:  Your blood pressure keeps being high. Get help right away if:  Your first blood pressure number is higher than 180.  Your second blood pressure number is higher than 120. This information is not intended to replace advice given to you by your health   care provider. Make sure you discuss any questions you have with your health care provider. Document Revised: 11/14/2017 Document Reviewed: 05/10/2016 Elsevier Patient Education  2020 [employer].  How to Take Your Blood Pressure You can take your blood pressure at home with a machine. You may need to check your blood pressure at home:  To check if you have high blood pressure (hypertension).  To check your blood pressure over time.  To make sure your blood pressure medicine is working. Supplies needed: You will need a blood pressure machine, or monitor. You can buy one  at a drugstore or online. When choosing one:  Choose one with an arm cuff.  Choose one that wraps around your upper arm. Only one finger should fit between your arm and the cuff.  Do not choose one that measures your blood pressure from your wrist or finger. Your doctor can suggest a monitor. How to prepare Avoid these things for 30 minutes before checking your blood pressure:  Drinking caffeine.  Drinking alcohol.  Eating.  Smoking.  Exercising. Five minutes before checking your blood pressure:  Pee.  Sit in a dining chair. Avoid sitting in a soft couch or armchair.  Be quiet. Do not talk. How to take your blood pressure Follow the instructions that came with your machine. If you have a digital blood pressure monitor, these may be the instructions: 12. Sit up straight. 13. Place your feet on the floor. Do not cross your ankles or legs. 14. Rest your left arm at the level of your heart. You may rest it on a table, desk, or chair. 15. Pull up your shirt sleeve. 16. Wrap the blood pressure cuff around the upper part of your left arm. The cuff should be 1 inch (2.5 cm) above your elbow. It is best to wrap the cuff around bare skin. 17. Fit the cuff snugly around your arm. You should be able to place only one finger between the cuff and your arm. 18. Put the cord inside the groove of your elbow. 19. Press the power button. 20. Sit quietly while the cuff fills with air and loses air. 21. Write down the numbers on the screen. 22. Wait 2-3 minutes and then repeat steps 1-10. What do the numbers mean? Two numbers make up your blood pressure. The first number is called systolic pressure. The second is called diastolic pressure. An example of a blood pressure reading is "120 over 80" (or 120/80). If you are an adult and do not have a medical condition, use this guide to find out if your blood pressure is normal: Normal  First number: below 120.  Second number: below  80. Elevated  First number: 120-129.  Second number: below 80. Hypertension stage 1  First number: 130-139.  Second number: 80-89. Hypertension stage 2  First number: 140 or above.  Second number: 32 or above. Your blood pressure is above normal even if only the top or bottom number is above normal. Follow these instructions at home:  Check your blood pressure as often as your doctor tells you to.  Take your monitor to your next doctor's appointment. Your doctor will: ? Make sure you are using it correctly. ? Make sure it is working right.  Make sure you understand what your blood pressure numbers should be.  Tell your doctor if your medicines are causing side effects. Contact a doctor if:  Your blood pressure keeps being high. Get help right away if:  Your first blood  pressure number is higher than 180.  Your second blood pressure number is higher than 120. This information is not intended to replace advice given to you by your health care provider. Make sure you discuss any questions you have with your health care provider. Document Revised: 11/14/2017 Document Reviewed: 05/10/2016 Elsevier Patient Education  2020 [employer].

## 2020-01-10 ENCOUNTER — Other Ambulatory Visit: Payer: Self-pay

## 2020-01-10 ENCOUNTER — Ambulatory Visit (INDEPENDENT_AMBULATORY_CARE_PROVIDER_SITE_OTHER): Payer: Medicare HMO | Admitting: Family

## 2020-01-10 ENCOUNTER — Encounter: Payer: Self-pay | Admitting: Family

## 2020-01-10 VITALS — BP 120/62 | HR 58 | Ht 61.0 in | Wt 183.8 lb

## 2020-01-10 DIAGNOSIS — I10 Essential (primary) hypertension: Secondary | ICD-10-CM | POA: Diagnosis not present

## 2020-01-10 DIAGNOSIS — G4733 Obstructive sleep apnea (adult) (pediatric): Secondary | ICD-10-CM

## 2020-01-10 DIAGNOSIS — Z9989 Dependence on other enabling machines and devices: Secondary | ICD-10-CM | POA: Diagnosis not present

## 2020-01-10 DIAGNOSIS — E782 Mixed hyperlipidemia: Secondary | ICD-10-CM | POA: Diagnosis not present

## 2020-01-10 NOTE — Patient Instructions (Addendum)
Medication Instructions:   No medication changes today.  You may take an additional half tablet of Losartan as needed if your systolic blood pressure is more than 150.  *If you need a refill on your cardiac medications before your next appointment, please call your pharmacy*  Lab Work: Your physician recommends that you return for lab work today: lipid pane, CMET, CBC.  If you have labs (blood work) drawn today and your tests are completely normal, you will receive your results only by: Marland Kitchen MyChart Message (if you have MyChart) OR . A paper copy in the mail If you have any lab test that is abnormal or we need to change your treatment, we will call you to review the results.  Testing/Procedures: You had an EKG today. It shows sinus bradycardia with a first degree AV block.   Follow-Up: At [facility], you and your health needs are our priority.  As part of our continuing mission to provide you with exceptional heart care, we have created designated Provider Care Teams.  These Care Teams include your primary Cardiologist (physician) and Advanced Practice Providers (APPs -  Physician Assistants and Nurse Practitioners) who all work together to provide you with the care you need, when you need it.  Your next appointment:   1 year(s)  The format for your next appointment:   In Person  Provider:    You may see Ida Rogue, MD or one of the following Advanced Practice Providers on your designated Care Team:    Murray Hodgkins, NP  Christell Faith, PA-C  Marrianne Mood, PA-C   Other Instructions  Wait list for vaccine: [email or web address]   DASH Eating Plan DASH stands for "Dietary Approaches to Stop Hypertension." The DASH eating plan is a healthy eating plan that has been shown to reduce high blood pressure (hypertension). It may also reduce your risk for type 2 diabetes, heart disease, and stroke.  The DASH eating plan may also help with weight loss. What are tips for following this plan?  General guidelines  Avoid eating more than 2,300 mg (milligrams) of salt (sodium) a day. If you have hypertension, you may need to reduce your sodium intake to 1,500 mg a day.  Limit alcohol intake to no more than 1 drink a day for nonpregnant women and 2 drinks a day for men. One drink equals 12 oz of beer, 5 oz of wine, or 1 oz of hard liquor.  Work with your health care provider to maintain a healthy body weight or to lose weight. Ask what an ideal weight is for you.  Get at least 30 minutes of exercise that causes your heart to beat faster (aerobic exercise) most days of the week. Activities may include walking, swimming, or biking.  Work with your health care provider or diet and nutrition specialist (dietitian) to adjust your eating plan to your individual calorie needs. Reading food labels   Check food labels for the amount of sodium per serving. Choose foods with less than 5 percent of the Daily Value of sodium. Generally, foods with less than 300 mg of sodium per serving fit into this eating plan.  To find whole grains, look for the word "whole" as the first word in the ingredient list. Shopping  Buy products labeled as "low-sodium" or "no salt added."  Buy fresh foods. Avoid canned foods and premade or frozen meals. Cooking  Avoid adding salt when cooking. Use salt-free seasonings or herbs instead of table salt or sea salt. Check with  your health care provider or pharmacist before using salt substitutes.  Do not fry foods. Cook foods using healthy methods such as baking, boiling, grilling, and broiling instead.  Cook with heart-healthy oils, such as olive, canola, soybean, or sunflower oil. Meal planning  Eat a balanced diet that includes: ? 5 or more servings of fruits and vegetables each day. At each meal, try to fill half of your plate with fruits and vegetables. ? Up to 6-8  servings of whole grains each day. ? Less than 6 oz of lean meat, poultry, or fish each day. A 3-oz serving of meat is about the same size as a deck of cards. One egg equals 1 oz. ? 2 servings of low-fat dairy each day. ? A serving of nuts, seeds, or beans 5 times each week. ? Heart-healthy fats. Healthy fats called Omega-3 fatty acids are found in foods such as flaxseeds and coldwater fish, like sardines, salmon, and mackerel.  Limit how much you eat of the following: ? Canned or prepackaged foods. ? Food that is high in trans fat, such as fried foods. ? Food that is high in saturated fat, such as fatty meat. ? Sweets, desserts, sugary drinks, and other foods with added sugar. ? Full-fat dairy products.  Do not salt foods before eating.  Try to eat at least 2 vegetarian meals each week.  Eat more home-cooked food and less restaurant, buffet, and fast food.  When eating at a restaurant, ask that your food be prepared with less salt or no salt, if possible. What foods are recommended? The items listed may not be a complete list. Talk with your dietitian about what dietary choices are best for you. Grains Whole-grain or whole-wheat bread. Whole-grain or whole-wheat pasta. Brown rice. Modena Morrow. Bulgur. Whole-grain and low-sodium cereals. Pita bread. Low-fat, low-sodium crackers. Whole-wheat flour tortillas. Vegetables Fresh or frozen vegetables (raw, steamed, roasted, or grilled). Low-sodium or reduced-sodium tomato and vegetable juice. Low-sodium or reduced-sodium tomato sauce and tomato paste. Low-sodium or reduced-sodium canned vegetables. Fruits All fresh, dried, or frozen fruit. Canned fruit in natural juice (without added sugar). Meat and other protein foods Skinless chicken or [place]. Ground chicken or [place]. Pork with fat trimmed off. Fish and seafood. Egg whites. Dried beans, peas, or lentils. Unsalted nuts, nut butters, and seeds. Unsalted canned beans. Lean cuts of beef  with fat trimmed off. Low-sodium, lean deli meat. Dairy Low-fat (1%) or fat-free (skim) milk. Fat-free, low-fat, or reduced-fat cheeses. Nonfat, low-sodium ricotta or cottage cheese. Low-fat or nonfat yogurt. Low-fat, low-sodium cheese. Fats and oils Soft margarine without trans fats. Vegetable oil. Low-fat, reduced-fat, or light mayonnaise and salad dressings (reduced-sodium). Canola, safflower, olive, soybean, and sunflower oils. Avocado. Seasoning and other foods Herbs. Spices. Seasoning mixes without salt. Unsalted popcorn and pretzels. Fat-free sweets. What foods are not recommended? The items listed may not be a complete list. Talk with your dietitian about what dietary choices are best for you. Grains Baked goods made with fat, such as croissants, muffins, or some breads. Dry pasta or rice meal packs. Vegetables Creamed or fried vegetables. Vegetables in a cheese sauce. Regular canned vegetables (not low-sodium or reduced-sodium). Regular canned tomato sauce and paste (not low-sodium or reduced-sodium). Regular tomato and vegetable juice (not low-sodium or reduced-sodium). Angie Fava. Olives. Fruits Canned fruit in a light or heavy syrup. Fried fruit. Fruit in cream or butter sauce. Meat and other protein foods Fatty cuts of meat. Ribs. Fried meat. Berniece Salines. Sausage. Bologna and other processed lunch meats.  Salami. Fatback. Hotdogs. Bratwurst. Salted nuts and seeds. Canned beans with added salt. Canned or smoked fish. Whole eggs or egg yolks. Chicken or [place] with skin. Dairy Whole or 2% milk, cream, and half-and-half. Whole or full-fat cream cheese. Whole-fat or sweetened yogurt. Full-fat cheese. Nondairy creamers. Whipped toppings. Processed cheese and cheese spreads. Fats and oils Butter. Stick margarine. Lard. Shortening. Ghee. Bacon fat. Tropical oils, such as coconut, palm kernel, or palm oil. Seasoning and other foods Salted popcorn and pretzels. Onion salt, garlic salt, seasoned salt,  table salt, and sea salt. Worcestershire sauce. Tartar sauce. Barbecue sauce. Teriyaki sauce. Soy sauce, including reduced-sodium. Steak sauce. Canned and packaged gravies. Fish sauce. Oyster sauce. Cocktail sauce. Horseradish that you find on the shelf. Ketchup. Mustard. Meat flavorings and tenderizers. Bouillon cubes. Hot sauce and Tabasco sauce. Premade or packaged marinades. Premade or packaged taco seasonings. Relishes. Regular salad dressings. Where to find more information:  National Heart, Lung, and [place]: https://wilson-eaton.com/  American Heart Association: www.heart.org Summary  The DASH eating plan is a healthy eating plan that has been shown to reduce high blood pressure (hypertension). It may also reduce your risk for type 2 diabetes, heart disease, and stroke.  With the DASH eating plan, you should limit salt (sodium) intake to 2,300 mg a day. If you have hypertension, you may need to reduce your sodium intake to 1,500 mg a day.  When on the DASH eating plan, aim to eat more fresh fruits and vegetables, whole grains, lean proteins, low-fat dairy, and heart-healthy fats.  Work with your health care provider or diet and nutrition specialist (dietitian) to adjust your eating plan to your individual calorie needs. This information is not intended to replace advice given to you by your health care provider. Make sure you discuss any questions you have with your health care provider. Document Revised: 11/14/2017 Document Reviewed: 11/25/2016 Elsevier Patient Education  2020 [employer].

## 2020-01-10 NOTE — Progress Notes (Signed)
Office Visit    Patient Name: Molly Faulkner Date of Encounter: 01/10/2020  Primary Care Provider:  Jearld Fenton, NP Primary Cardiologist:  Ida Rogue, MD Electrophysiologist:  None   Chief Complaint    Molly Faulkner is a 79 y.o. female with a hx of HTN, syncope, tachycardia, OSA on CPAP presents today for follow-up of tachycardia and hypertension  Past Medical History    Past Medical History:  Diagnosis Date  . Allergy   . Arthritis   . Asthma   . Carpal tunnel syndrome 12/2017   left, referred to neurology for EMG testing  . 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/2017  . Chicken pox   . Depression   . Diverticulitis   . Diverticulosis   . Dysrhythmia    paroxysmal tachycardia  . GERD (gastroesophageal reflux disease)   . Hx of colonic polyps   . Hyperlipidemia   . Hypertension   . Insomnia   . 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 Sleep apnea    H/O   NO LONGER  . Syncope    Past Surgical History:  Procedure Laterality Date  . ABDOMINAL HYSTERECTOMY  1994   total  . BREAST BIOPSY Bilateral [id number]  . CARPAL TUNNEL RELEASE Bilateral   . CATARACT EXTRACTION W/PHACO Right 12/02/2017   Procedure: CATARACT EXTRACTION PHACO AND INTRAOCULAR LENS PLACEMENT (IOC);  Surgeon: Birder Robson, MD;  Location: [facility] ORS;  Service: Ophthalmology;  Laterality: Right;  [place] 00:36 AP% 11.5 CDE 4.21 Fluid pack lot # [id number]  . CATARACT EXTRACTION W/PHACO Left 12/30/2017   Procedure: CATARACT EXTRACTION PHACO AND INTRAOCULAR LENS PLACEMENT ([place]);  Surgeon: Birder Robson, MD;  Location: [facility] ORS;  Service: Ophthalmology;  Laterality: Left;  [place] 00:58 AP% 11.6 CDE 6.80 Fluid pack lot # [id number]  . COLONOSCOPY WITH PROPOFOL N/A 06/30/2018   Procedure: COLONOSCOPY WITH PROPOFOL;  Surgeon: Jonathon Bellows, MD;  Location: [facility] ENDOSCOPY;  Service: Gastroenterology;  Laterality: N/A;  . DILATION AND CURETTAGE OF UTERUS     X 2  . KNEE ARTHROSCOPY      Allergies  Allergies  Allergen Reactions   . Avelox [Moxifloxacin Hcl In Nacl] Other (See Comments)    Chills  . Trazodone And Nefazodone Swelling  . Clarithromycin Other (See Comments)    Unsure of reaction type    History of Present Illness    Molly Faulkner is a 79 y.o. female with a hx of HTN, syncope, tachycardia, OA, asthma last seen 12/2018 by Dr.Gollan.  Previous cardiac work-up in 2011 in [place] with echo and stress test.  Her episode of syncope was February 2016 while on a plane to [place].  Had not had much to drink or eat, queasy feeling in her stomach, got up developed lightheadedness, dizziness, is acute syncope and without first just a few seconds.  She had a normal echocardiogram 06/2017.  At last office visit she has been working hard on weight loss and her pressure medications were subsequently reduced for low blood pressures and bradycardia.  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  She reports feeling of well.  Denies chest pain, pressure, tightness.  Reports no shortness of breath no DOE.  Her activity is limited by chronic back pain so she tries to be very careful about what she eats.  Checks her blood pressure on Mondays for monitoring. On the 0000000 she had a systolic blood pressure in the 150s.  Had been taking glucosamine and chonditrin.  She wonders  if this can contribute we discussed that these generally did not affect the blood pressure.  Her blood pressure that days improved on its own.  Blood pressure since that time have been 120s-140s with average BP <130/80.   EKGs/Labs/Other Studies Reviewed:   The following studies were reviewed today:  EKG:  EKG is ordered today.  The ekg ordered today demonstrates sinus bradycardia 58 bpm with first-degree AV block-stable compared to previous.  Recent Labs: 10/05/2019: ALT 12; BUN 15; Creatinine, Ser 0.90; Hemoglobin 13.0; Platelets 212.0; Potassium 3.8; Sodium 138  Recent Lipid Panel    Component Value Date/Time   CHOL 185 10/05/2019 1521   TRIG 136.0  10/05/2019 1521   HDL 48.20 10/05/2019 1521   CHOLHDL 4 10/05/2019 1521   VLDL 27.2 10/05/2019 1521   LDLCALC 109 (H) 10/05/2019 1521   LDLCALC 110 (H) 05/29/2018 1547    Home Medications   Current Meds  Medication Sig  . albuterol (VENTOLIN HFA) 108 (90 Base) MCG/ACT inhaler Inhale 2 puffs into the lungs every 6 (six) hours as needed for wheezing or shortness of breath.  Rodman Key EX Apply 1 application topically 4 (four) times daily as needed (for arthritis pain.).  Marland Kitchen atenolol (TENORMIN) 25 MG tablet Take 1 tablet (25 mg) by mouth twice a day  . cholecalciferol (VITAMIN D) 1000 units tablet Take 1,000 Units by mouth every other day. At bedtime.  Marland Kitchen CREAM BASE EX Apply 1-2 application topically 4 (four) times daily as needed (for arthritis pain.). Penetrex Inflammation Formula (Arnica/Pyridoxine/MSM/Boswellia/Cetyl Myristoleate)  . fluticasone-salmeterol (ADVAIR HFA) 115-21 MCG/ACT inhaler Inhale 1 puff into the lungs twice daily as needed  . hydrochlorothiazide (HYDRODIURIL) 25 MG tablet Take 1/2 tablet (12.5 mg) by mouth twice a day  . HYDROcodone-acetaminophen (NORCO/VICODIN) 5-325 MG tablet Take 1 tablet by mouth daily as needed for moderate pain.  Marland Kitchen ibuprofen (ADVIL,MOTRIN) 200 MG tablet Take 200 mg by mouth at bedtime as needed (for arthritis pain.).  Marland Kitchen Liniments (ORTHOGEL EX) Apply 1-2 application topically 4 (four) times daily as needed (for arthritis pain relief.).  Marland Kitchen losartan (COZAAR) 50 MG tablet Take 1/2 tablet (25 mg) by mouth twice a day  . Magnesium Chloride POWD Apply 1 application topically 4 (four) times daily as needed (for arthritis pain.). Magnesium Oil with Arnica  . Menthol-Methyl Salicylate (PAIN RELIEVING) CREA Apply 1 application topically 4 (four) times daily as needed (for arthritis pain). EFAC PAIN RELIEVING CREAM  . mirtazapine (REMERON SOL-TAB) 15 MG disintegrating tablet Dissolve 1/4 - 1/2 tablet (3.75 mg- 7.5 mg) on the tongue at bedtime  . Multiple  Vitamins-Minerals (EYE SUPPORT PO) Take 1 tablet by mouth at bedtime.  . [DISCONTINUED] fluticasone-salmeterol (ADVAIR HFA) 115-21 MCG/ACT inhaler INHALE 1 PUFF INTO THE LUNGS TWICE DAILY  . [DISCONTINUED] losartan (COZAAR) 50 MG tablet Take 1 tablet (50 mg) by mouth once daily    Review of Systems      Review of Systems  Constitution: Negative for chills, fever and malaise/fatigue.  Cardiovascular: Negative for chest pain, dyspnea on exertion, leg swelling, near-syncope, orthopnea, palpitations and syncope.  Respiratory: Negative for cough, shortness of breath and wheezing.   Gastrointestinal: Negative for nausea and vomiting.  Neurological: Negative for dizziness, light-headedness and weakness.   All other systems reviewed and are otherwise negative except as noted above.  Physical Exam    VS:  BP 120/62   Pulse (!) 58   Ht 5\' 1"  (1.549 m)   Wt 183 lb 12 oz (83.3 kg)  BMI 34.72 kg/m  , BMI Body mass index is 34.72 kg/m. GEN: Well nourished, well developed, in no acute distress. HEENT: normal. Neck: Supple, no JVD, carotid bruits, or masses. Cardiac: RRR, no murmurs, rubs, or gallops. No clubbing, cyanosis, edema.  Radials/DP/PT 2+ and equal bilaterally.  Respiratory:  Respirations regular and unlabored, clear to auscultation bilaterally. GI: Soft, nontender, nondistended, BS + x 4. MS: No deformity or atrophy. Skin: Warm and dry, no rash. Neuro:  Strength and sensation are intact. Psych: Normal affect.  Accessory Clinical Findings    ECG personally reviewed by me today -sinus bradycardia 50 bpm with first-degree AV block-stable compared to previous- no acute changes.  Assessment & Plan    1. HTN - BP well controlled today.  Follows with her PCP.  Continue present regimen: atenolol 25 mg twice daily, HCTZ 12.5 mg twice daily, losartan 25 mg twice daily.  CMET, CBC today  She had 1 day last week of elevated systolic blood pressure [id number].  Her BP cuff was assessed today and  was found to be accurate.  That elevated blood pressure self resolved back to normal.  We discussed possible triggers of elevated blood pressure including sodium intake, stress.  She was educated that she may take an additional tablet of losartan as needed for systolic blood pressure greater than 160.  2. Sinus bradycardia - Recurrent finding on EKG today. Improvement since dose of Atenolol was decreased. Asymptomatic. Continue to monitor with annual EKG.   3. First degree AV block - Stable finding on EKG today. Continue to monitor with annual EKG.  4. HLD - Last lipid panel 09/2019. Presently controlled by diet. Lipid profile today.  5. OSA - Compliant with CPAP.   Disposition: Follow up in 1 year(s) with Dr. Rockey Situ or APP  Loel Dubonnet, NP 01/10/2020, 9:26 AM

## 2020-01-11 ENCOUNTER — Telehealth: Payer: Self-pay

## 2020-01-11 DIAGNOSIS — E782 Mixed hyperlipidemia: Secondary | ICD-10-CM

## 2020-01-11 LAB — COMPREHENSIVE METABOLIC PANEL
ALT: 16 IU/L (ref 0–32)
AST: 18 IU/L (ref 0–40)
Albumin/Globulin Ratio: 2 (ref 1.2–2.2)
Albumin: 4.7 g/dL (ref 3.7–4.7)
Alkaline Phosphatase: 87 IU/L (ref 39–117)
BUN/Creatinine Ratio: 17 (ref 12–28)
BUN: 15 mg/dL (ref 8–27)
Bilirubin Total: 0.8 mg/dL (ref 0.0–1.2)
CO2: 23 mmol/L (ref 20–29)
Calcium: 10.2 mg/dL (ref 8.7–10.3)
Chloride: 103 mmol/L (ref 96–106)
Creatinine, Ser: 0.88 mg/dL (ref 0.57–1.00)
GFR calc Af Amer: 74 mL/min/{1.73_m2} (ref >59)
GFR calc non Af Amer: 64 mL/min/{1.73_m2} (ref >59)
Globulin, Total: 2.3 g/dL (ref 1.5–4.5)
Glucose: 101 mg/dL — ABNORMAL HIGH (ref 65–99)
Potassium: 4 mmol/L (ref 3.5–5.2)
Sodium: 140 mmol/L (ref 134–144)
Total Protein: 7 g/dL (ref 6.0–8.5)

## 2020-01-11 LAB — CBC WITH DIFFERENTIAL/PLATELET
Basophils Absolute: 0 10*3/uL (ref 0.0–0.2)
Basos: 1 %
EOS (ABSOLUTE): 0.3 10*3/uL (ref 0.0–0.4)
Eos: 4 %
Hematocrit: 40.1 % (ref 34.0–46.6)
Hemoglobin: 13.9 g/dL (ref 11.1–15.9)
Immature Grans (Abs): 0 10*3/uL (ref 0.0–0.1)
Immature Granulocytes: 0 %
Lymphocytes Absolute: 1 10*3/uL (ref 0.7–3.1)
Lymphs: 15 %
MCH: 33.6 pg — ABNORMAL HIGH (ref 26.6–33.0)
MCHC: 34.7 g/dL (ref 31.5–35.7)
MCV: 97 fL (ref 79–97)
Monocytes Absolute: 0.5 10*3/uL (ref 0.1–0.9)
Monocytes: 7 %
Neutrophils Absolute: 4.9 10*3/uL (ref 1.4–7.0)
Neutrophils: 73 %
Platelets: 227 10*3/uL (ref 150–450)
RBC: 4.14 x10E6/uL (ref 3.77–5.28)
RDW: 12.3 % (ref 11.7–15.4)
WBC: 6.7 10*3/uL (ref 3.4–10.8)

## 2020-01-11 LAB — LIPID PANEL
Chol/HDL Ratio: 3.2 ratio (ref 0.0–4.4)
Cholesterol, Total: 203 mg/dL — ABNORMAL HIGH (ref 100–199)
HDL: 63 mg/dL (ref >39)
LDL Chol Calc (NIH): 129 mg/dL — ABNORMAL HIGH (ref 0–99)
Triglycerides: 62 mg/dL (ref 0–149)
VLDL Cholesterol Cal: 11 mg/dL (ref 5–40)

## 2020-01-11 MED ORDER — ATORVASTATIN CALCIUM 20 MG PO TABS: 20.0000 mg | ORAL_TABLET | Freq: Every day | ORAL | 12 refills | Status: DC

## 2020-01-11 NOTE — Telephone Encounter (Signed)
  Spoke with Ms Minus and provided her with the below recommendations per Hoy Morn.  Kidney function and liver function normal. Electrolytes look good. Very mildly elevated glucose, not of concern. Blood counts with no evidence of anemia. Cholesterol panel shows elevated LDL ("bad cholesterol"). Would recommend starting Atorvastatin 20mg  daily to get LDL down to goal of less than 100. Would repeat lipid/liver in 6 weeks.   Pt verbalized instructions and was agreeable.  Pt had no additional questions.   Atorvastatin 20 mg take 1 daily was sent in to pharmacy.  Lab orders were placed for 6 week labs.

## 2020-01-12 ENCOUNTER — Encounter: Payer: Self-pay | Admitting: Family

## 2020-01-13 ENCOUNTER — Other Ambulatory Visit: Payer: Self-pay

## 2020-01-13 MED ORDER — FLUTICASONE-SALMETEROL 115-21 MCG/ACT IN AERO: 2.0000 | INHALATION_SPRAY | Freq: Two times a day (BID) | RESPIRATORY_TRACT | 5 refills | Status: DC

## 2020-01-13 NOTE — Telephone Encounter (Signed)
Received Rx request for Advair from [facility]. Rx has been sent to preferred pharmacy.  Nothing further is needed.

## 2020-01-17 ENCOUNTER — Telehealth: Payer: Self-pay | Admitting: Internal Medicine

## 2020-01-17 NOTE — Telephone Encounter (Signed)
Patient dropped off Handicapped Placard form.  Form is in rx tower. Please call patient when form is ready for pick up.

## 2020-01-17 NOTE — Telephone Encounter (Signed)
I feel like she had a handicap placard from when her sister was under my care. Why is she requesting this. She has no breathing issues and does not use an assistive device.

## 2020-01-19 DIAGNOSIS — Z0279 Encounter for issue of other medical certificate: Secondary | ICD-10-CM

## 2020-01-19 NOTE — Telephone Encounter (Signed)
Pt is having increased hip and back pain... MRI from 09/2019 pasted below  IMPRESSION 1. Lumbar spondylosis and degenerative disc disease, causing potential prominent impingement at T11-12 (although not fully characterized) as well as moderate impingement at L3-4; mild to moderate impingement at L5-S1; and mild impingement at L1-2 and L2-3, as detailed above.  2. Thin lipoma of the filum terminale.

## 2020-01-19 NOTE — Telephone Encounter (Signed)
Noted, form filled out and placed in MYD box.

## 2020-01-21 NOTE — Telephone Encounter (Signed)
Placed in front office for pick up, pt is aware

## 2020-02-22 DIAGNOSIS — Z961 Presence of intraocular lens: Secondary | ICD-10-CM | POA: Diagnosis not present

## 2020-02-24 ENCOUNTER — Other Ambulatory Visit: Payer: Self-pay | Admitting: Cardiovascular Disease

## 2020-02-25 DIAGNOSIS — G4733 Obstructive sleep apnea (adult) (pediatric): Secondary | ICD-10-CM | POA: Diagnosis not present

## 2020-03-10 ENCOUNTER — Other Ambulatory Visit: Payer: Self-pay | Admitting: Internal Medicine

## 2020-03-10 DIAGNOSIS — J4520 Mild intermittent asthma, uncomplicated: Secondary | ICD-10-CM

## 2020-03-22 ENCOUNTER — Other Ambulatory Visit
Admission: RE | Admit: 2020-03-22 | Discharge: 2020-03-22 | Disposition: A | Discharge status: Home / Self Care | Payer: Medicare HMO | Source: Ambulatory Visit | Attending: Family | Admitting: Family

## 2020-03-22 ENCOUNTER — Telehealth: Payer: Self-pay

## 2020-03-22 DIAGNOSIS — G8929 Other chronic pain: Secondary | ICD-10-CM | POA: Diagnosis not present

## 2020-03-22 DIAGNOSIS — G5603 Carpal tunnel syndrome, bilateral upper limbs: Secondary | ICD-10-CM | POA: Diagnosis not present

## 2020-03-22 DIAGNOSIS — M545 Low back pain: Secondary | ICD-10-CM | POA: Diagnosis not present

## 2020-03-22 DIAGNOSIS — E782 Mixed hyperlipidemia: Secondary | ICD-10-CM | POA: Insufficient documentation

## 2020-03-22 DIAGNOSIS — R200 Anesthesia of skin: Secondary | ICD-10-CM | POA: Diagnosis not present

## 2020-03-22 LAB — HEPATIC FUNCTION PANEL
ALT: 16 U/L (ref 0–44)
AST: 20 U/L (ref 15–41)
Albumin: 4.1 g/dL (ref 3.5–5.0)
Alkaline Phosphatase: 61 U/L (ref 38–126)
Bilirubin, Direct: 0.1 mg/dL (ref 0.0–0.2)
Indirect Bilirubin: 1.2 mg/dL — ABNORMAL HIGH (ref 0.3–0.9)
Total Bilirubin: 1.3 mg/dL — ABNORMAL HIGH (ref 0.3–1.2)
Total Protein: 7.1 g/dL (ref 6.5–8.1)

## 2020-03-22 LAB — LIPID PANEL
Cholesterol: 188 mg/dL (ref 0–200)
HDL: 59 mg/dL (ref >40)
LDL Cholesterol: 120 mg/dL — ABNORMAL HIGH (ref 0–99)
Total CHOL/HDL Ratio: 3.2 RATIO
Triglycerides: 44 mg/dL (ref <150)
VLDL: 9 mg/dL (ref 0–40)

## 2020-03-22 MED ORDER — ATORVASTATIN CALCIUM 20 MG PO TABS: 20.0000 mg | ORAL_TABLET | Freq: Every day | ORAL | 3 refills | Status: DC

## 2020-03-22 NOTE — Telephone Encounter (Signed)
-----   Message from Loel Dubonnet, NP sent at 03/22/2020 12:35 PM EDT ----- Liver enzymes normal. Bilirubin very  mildly elevated, not of concern. Lipid panel shows LDL remains above goal of 100. Recommend starting Atorvastatin 20mg  daily.  At visit 12/2019 discussed 6-8 weeks of dietary changes prior to initiation of statin. As her LDL remains >100, would recommend statin. Likely a genetic component that her LDL has not lowered despite diet and exercise changes.

## 2020-03-22 NOTE — Telephone Encounter (Signed)
Call to patient to review labs.    Pt verbalized understanding and has no further questions at this time.    Advised pt to call for any further questions or concerns.  Ordered atorvastatin.

## 2020-04-14 DIAGNOSIS — G5603 Carpal tunnel syndrome, bilateral upper limbs: Secondary | ICD-10-CM | POA: Diagnosis not present

## 2020-05-08 DIAGNOSIS — G5603 Carpal tunnel syndrome, bilateral upper limbs: Secondary | ICD-10-CM | POA: Diagnosis not present

## 2020-05-19 DIAGNOSIS — M1612 Unilateral primary osteoarthritis, left hip: Secondary | ICD-10-CM | POA: Diagnosis not present

## 2020-05-19 DIAGNOSIS — M47816 Spondylosis without myelopathy or radiculopathy, lumbar region: Secondary | ICD-10-CM | POA: Diagnosis not present

## 2020-05-19 DIAGNOSIS — M545 Low back pain: Secondary | ICD-10-CM | POA: Diagnosis not present

## 2020-05-19 DIAGNOSIS — M25552 Pain in left hip: Secondary | ICD-10-CM | POA: Diagnosis not present

## 2020-05-25 ENCOUNTER — Other Ambulatory Visit: Payer: Self-pay | Admitting: Family

## 2020-06-22 ENCOUNTER — Other Ambulatory Visit: Payer: Self-pay | Admitting: Cardiovascular Disease

## 2020-06-22 NOTE — Telephone Encounter (Signed)
°*  STAT* If patient is at the pharmacy, call can be transferred to refill team.   1. Which medications need to be refilled? (please list name of each medication and dose if known) atenolol 25 po BID  2. Which pharmacy/location (including street and city if local pharmacy) is medication to be sent to?  [facility] order  3. Do they need a 30 day or 90 day supply? [place]

## 2020-06-23 MED ORDER — ATENOLOL 25 MG PO TABS: 25.0000 mg | ORAL_TABLET | Freq: Two times a day (BID) | ORAL | 1 refills | Status: DC

## 2020-06-28 MED ORDER — ATENOLOL 25 MG PO TABS: 25.0000 mg | ORAL_TABLET | Freq: Two times a day (BID) | ORAL | 1 refills | Status: DC

## 2020-06-28 NOTE — Addendum Note (Signed)
Addended by: Britt Bottom on: 06/28/2020 05:11 PM   Modules accepted: Orders

## 2020-06-28 NOTE — Telephone Encounter (Signed)
Requested Prescriptions   Signed Prescriptions Disp Refills  . atenolol (TENORMIN) 25 MG tablet 180 tablet 1    Sig: Take 1 tablet (25 mg total) by mouth 2 (two) times daily. Take 1 tablet (25 mg) by mouth twice a day    Authorizing Provider: Minna Merritts    Ordering User: Britt Bottom

## 2020-06-30 ENCOUNTER — Ambulatory Visit (INDEPENDENT_AMBULATORY_CARE_PROVIDER_SITE_OTHER): Payer: Medicare HMO | Admitting: Internal Medicine

## 2020-06-30 ENCOUNTER — Encounter: Payer: Self-pay | Admitting: Internal Medicine

## 2020-06-30 VITALS — BP 140/70 | HR 65 | Temp 98.1°F | Wt 179.0 lb

## 2020-06-30 DIAGNOSIS — M1612 Unilateral primary osteoarthritis, left hip: Secondary | ICD-10-CM | POA: Diagnosis not present

## 2020-06-30 DIAGNOSIS — M533 Sacrococcygeal disorders, not elsewhere classified: Secondary | ICD-10-CM | POA: Diagnosis not present

## 2020-06-30 DIAGNOSIS — R21 Rash and other nonspecific skin eruption: Secondary | ICD-10-CM

## 2020-06-30 DIAGNOSIS — M479 Spondylosis, unspecified: Secondary | ICD-10-CM | POA: Diagnosis not present

## 2020-06-30 NOTE — Progress Notes (Signed)
Subjective:    Patient ID: Molly Faulkner, female    DOB: 23-Mar-1941, 79 y.o.   MRN: [id number]  HPI  Pt presents to the clinic today requesting second opinion for ortho referral.  She reports she is currently seeing Dr. Rudene Christians for low back and hip pain.  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  She describes the pain as achy and throbbing but can be sharp and stabbing at times.  The pain is worse with ambulation.  She denies numbness, tingling or weakness of the lower extremities.  She had a MRI 09/2019.  She was told she will eventually need a left hip replacement.  She is currently walking with a cane.  She takes ibuprofen 200 mg at bedtime as needed and reports this is not as effective as it used to be.  She does use the Xylocaine patches and has a gabapentin prescription but is not taking this.  She would like referral to see Dr. Nelva Bush or Dr. Cristy Folks.  She also reports rash to bilateral forearms for the last few years.  The rash does not itch or burn.  She has tried salicylic acid with minimal relief of symptoms.  She has not seen a dermatologist for this.  Review of Systems  Past Medical History:  Diagnosis Date  . Allergy   . Arthritis   . Asthma   . Carpal tunnel syndrome 12/2017   left, referred to neurology for EMG testing  . 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/2017  . Chicken pox   . Depression   . Diverticulitis   . Diverticulosis   . Dysrhythmia    paroxysmal tachycardia  . GERD (gastroesophageal reflux disease)   . Hx of colonic polyps   . Hyperlipidemia   . Hypertension   . Insomnia   . 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 Sleep apnea    H/O   NO LONGER  . Syncope     Current Outpatient Medications  Medication Sig Dispense Refill  . albuterol (VENTOLIN HFA) 108 (90 Base) MCG/ACT inhaler INHALE 2 PUFFS INTO THE LUNGS EVERY 6 HOURS AS NEEDED FOR WHEEZING OR SHORTNESS OF BREATH 18 g 1  . ARNICA EX Apply 1 application topically 4 (four) times daily  as needed (for arthritis pain.).    Marland Kitchen atenolol (TENORMIN) 25 MG tablet Take 1 tablet (25 mg total) by mouth 2 (two) times daily. Take 1 tablet (25 mg) by mouth twice a day 180 tablet 1  . atorvastatin (LIPITOR) 20 MG tablet Take 1 tablet (20 mg total) by mouth daily. 30 tablet 12  . atorvastatin (LIPITOR) 20 MG tablet Take 1 tablet (20 mg total) by mouth daily. 90 tablet 3  . cholecalciferol (VITAMIN D) 1000 units tablet Take 1,000 Units by mouth every other day. At bedtime.    Marland Kitchen CREAM BASE EX Apply 1-2 application topically 4 (four) times daily as needed (for arthritis pain.). Penetrex Inflammation Formula (Arnica/Pyridoxine/MSM/Boswellia/Cetyl Myristoleate)    . fluticasone-salmeterol (ADVAIR HFA) 115-21 MCG/ACT inhaler Inhale 2 puffs into the lungs 2 (two) times daily. Inhale 1 puff into the lungs twice daily as needed 1 Inhaler 5  . hydrochlorothiazide (HYDRODIURIL) 25 MG tablet Take 1/2 tablet (12.5 mg) by mouth twice a day    . HYDROcodone-acetaminophen (NORCO/VICODIN) 5-325 MG tablet Take 1 tablet by mouth daily as needed for moderate pain. 10 tablet 0  . ibuprofen (ADVIL,MOTRIN) 200 MG tablet Take 200 mg by mouth at bedtime as  needed (for arthritis pain.).    Marland Kitchen Liniments (ORTHOGEL EX) Apply 1-2 application topically 4 (four) times daily as needed (for arthritis pain relief.).    Marland Kitchen losartan (COZAAR) 50 MG tablet TAKE 1/2 TABLET IN THE MORNING  AND TAKE 1/2 TABLET IN THE EVENING 90 tablet 1  . Magnesium Chloride POWD Apply 1 application topically 4 (four) times daily as needed (for arthritis pain.). Magnesium Oil with Arnica    . Menthol-Methyl Salicylate (PAIN RELIEVING) CREA Apply 1 application topically 4 (four) times daily as needed (for arthritis pain). EFAC PAIN RELIEVING CREAM    . mirtazapine (REMERON SOL-TAB) 15 MG disintegrating tablet Dissolve 1/4 - 1/2 tablet (3.75 mg- 7.5 mg) on the tongue at bedtime    . Multiple Vitamins-Minerals (EYE SUPPORT PO) Take 1 tablet by mouth at bedtime.      No current facility-administered medications for this visit.    Allergies  Allergen Reactions  . Avelox [Moxifloxacin Hcl In Nacl] Other (See Comments)    Chills  . Trazodone And Nefazodone Swelling  . Clarithromycin Other (See Comments)    Unsure of reaction type    Family History  Problem Relation Age of Onset  . Breast cancer Mother 12  . Arthritis Maternal Grandmother   . Diabetes Maternal Grandmother     Social History   Socioeconomic History  . Marital status: Single    Spouse name: Not on file  . Number of children: Not on file  . Years of education: Not on file  . Highest education level: Not on file  Occupational History  . Not on file  Tobacco Use  . Smoking status: Former Smoker    Packs/day: 0.50    Years: 7.00    Pack years: 3.50  . Smokeless tobacco: Never Used  . Tobacco comment: quit over 40 years ago  Vaping Use  . Vaping Use: Never used  Substance and Sexual Activity  . Alcohol use: Yes    Comment: rare--liquor  . Drug use: No  . Sexual activity: Not Currently  Other Topics Concern  . Not on file  Social History Narrative  . Not on file   Social Determinants of Health   Financial Resource Strain:   . Difficulty of Paying Living Expenses:   Food Insecurity:   . Worried About Charity fundraiser in the Last Year:   . Arboriculturist in the Last Year:   Transportation Needs:   . Film/video editor (Medical):   Marland Kitchen Lack of Transportation (Non-Medical):   Physical Activity:   . Days of Exercise per Week:   . Minutes of Exercise per Session:   Stress:   . Feeling of Stress :   Social Connections:   . Frequency of Communication with Friends and Family:   . Frequency of Social Gatherings with Friends and Family:   . Attends Religious Services:   . Active Member of Clubs or Organizations:   . Attends Archivist Meetings:   Marland Kitchen Marital Status:   Intimate Partner Violence:   . Fear of Current or Ex-Partner:   . Emotionally  Abused:   Marland Kitchen Physically Abused:   . Sexually Abused:      Constitutional: Denies fever, malaise, fatigue, headache or abrupt weight changes.  Respiratory: Denies difficulty breathing, shortness of breath, cough or sputum production.   Cardiovascular: Denies chest pain, chest tightness, palpitations or swelling in the hands or feet.  Musculoskeletal: Patient reports chronic back pain, chronic hip pain.  Denies  decrease in range of motion, muscle pain or joint swelling.  Skin: Patient reports rash of bilateral forearms.  Denies lesions or ulcercations.  Neurological: Denies numbness, tingling, weakness or problems with balance and coordination.    No other specific complaints in a complete review of systems (except as listed in HPI above).     Objective:   Physical Exam   BP 140/70   Pulse 65   Temp 98.1 F (36.7 C) (Temporal)   Wt 179 lb (81.2 kg)   SpO2 98%   BMI 33.82 kg/m   Wt Readings from Last 3 Encounters:  01/10/20 183 lb 12 oz (83.3 kg)  10/05/19 182 lb (82.6 kg)  09/29/19 182 lb 4 oz (82.7 kg)    General: Appears her stated age, obese in NAD. Skin: Grouped macular lesions on bilateral forearms. HEENT: Head: normal shape and size; Eyes: sclera white, no icterus, conjunctiva pink, PERRLA and EOMs intact;  Cardiovascular: Normal rate. Pulmonary/Chest: Normal effort. Musculoskeletal: Normal flexion, extension and rotation of the cervical spine.  Bony tenderness noted over the lumbar spine and left SI joint.  Normal abduction, abduction of the left hip.  Pain with internal rotation of left hip.  Normal external rotation of left hip.  Strength 5/5 BLE.  Gait slow and steady with use of cane. Neurological: Alert and oriented.    BMET    Component Value Date/Time   NA 140 01/10/2020 0950   K 4.0 01/10/2020 0950   CL 103 01/10/2020 0950   CO2 23 01/10/2020 0950   GLUCOSE 101 (H) 01/10/2020 0950   GLUCOSE 88 10/05/2019 1521   BUN 15 01/10/2020 0950   CREATININE  0.88 01/10/2020 0950   CREATININE 0.99 (H) 05/29/2018 1547   CALCIUM 10.2 01/10/2020 0950   GFRNONAA 64 01/10/2020 0950   GFRAA 74 01/10/2020 0950    Lipid Panel     Component Value Date/Time   CHOL 188 03/22/2020 1139   CHOL 203 (H) 01/10/2020 0950   TRIG 44 03/22/2020 1139   HDL 59 03/22/2020 1139   HDL 63 01/10/2020 0950   CHOLHDL 3.2 03/22/2020 1139   VLDL 9 03/22/2020 1139   LDLCALC 120 (H) 03/22/2020 1139   LDLCALC 129 (H) 01/10/2020 0950   LDLCALC 110 (H) 05/29/2018 1547    CBC    Component Value Date/Time   WBC 6.7 01/10/2020 0950   WBC 6.3 10/05/2019 1521   RBC 4.14 01/10/2020 0950   RBC 3.96 10/05/2019 1521   HGB 13.9 01/10/2020 0950   HCT 40.1 01/10/2020 0950   PLT 227 01/10/2020 0950   MCV 97 01/10/2020 0950   MCH 33.6 (H) 01/10/2020 0950   MCH 33.3 (H) 05/29/2018 1547   MCHC 34.7 01/10/2020 0950   MCHC 35.2 10/05/2019 1521   RDW 12.3 01/10/2020 0950   LYMPHSABS 1.0 01/10/2020 0950   EOSABS 0.3 01/10/2020 0950   BASOSABS 0.0 01/10/2020 0950    Hgb A1C Lab Results  Component Value Date   HGBA1C 5.1 02/20/2017           Assessment & Plan:   Chronic Low Back Pain, Chronic Left Hip Pain:  Advised her to try Aleve 1 tab twice daily instead of ibuprofen or increase the ibuprofen to 400 mg as needed Continue Xylocaine patch Advised her to try gabapentin at bedtime Referral to orthopedics placed for second opinion  Rash of Bilateral Forearms:  Referral to dermatology placed for further evaluation  Return precautions discussed Webb Silversmith, NP This visit occurred during  the SARS-CoV-2 public health emergency.  Safety protocols were in place, including screening questions prior to the visit, additional usage of staff PPE, and extensive cleaning of exam room while observing appropriate contact time as indicated for disinfecting solutions.

## 2020-07-03 ENCOUNTER — Encounter: Payer: Self-pay | Admitting: Internal Medicine

## 2020-07-03 NOTE — Patient Instructions (Signed)
Rash, Adult  A rash is a change in the color of your skin. A rash can also change the way your skin feels. There are many different conditions and factors that can cause a rash. Follow these instructions at home: The goal of treatment is to stop the itching and keep the rash from spreading. Watch for any changes in your symptoms. Let your doctor know about them. Follow these instructions to help with your condition: Medicine Take or apply over-the-counter and prescription medicines only as told by your doctor. These may include medicines:  To treat red or swollen skin (corticosteroid creams).  To treat itching.  To treat an allergy (oral antihistamines).  To treat very bad symptoms (oral corticosteroids).  Skin care  Put cool cloths (compresses) on the affected areas.  Do not scratch or rub your skin.  Avoid covering the rash. Make sure that the rash is exposed to air as much as possible. Managing itching and discomfort  Avoid hot showers or baths. These can make itching worse. A cold shower may help.  Try taking a bath with: ? Epsom salts. You can get these at your local pharmacy or grocery store. Follow the instructions on the package. ? Baking soda. Pour a small amount into the bath as told by your doctor. ? Colloidal oatmeal. You can get this at your local pharmacy or grocery store. Follow the instructions on the package.  Try putting baking soda paste onto your skin. Stir water into baking soda until it gets like a paste.  Try putting on a lotion that relieves itchiness (calamine lotion).  Keep cool and out of the sun. Sweating and being hot can make itching worse. General instructions   Rest as needed.  Drink enough fluid to keep your pee (urine) pale yellow.  Wear loose-fitting clothing.  Avoid scented soaps, detergents, and perfumes. Use gentle soaps, detergents, perfumes, and other cosmetic products.  Avoid anything that causes your rash. Keep a journal to  help track what causes your rash. Write down: ? What you eat. ? What cosmetic products you use. ? What you drink. ? What you wear. This includes jewelry.  Keep all follow-up visits as told by your doctor. This is important. Contact a doctor if:  You sweat at night.  You lose weight.  You pee (urinate) more than normal.  You pee less than normal, or you notice that your pee is a darker color than normal.  You feel weak.  You throw up (vomit).  Your skin or the whites of your eyes look yellow (jaundice).  Your skin: ? Tingles. ? Is numb.  Your rash: ? Does not go away after a few days. ? Gets worse.  You are: ? More thirsty than normal. ? More tired than normal.  You have: ? New symptoms. ? Pain in your belly (abdomen). ? A fever. ? Watery poop (diarrhea). Get help right away if:  You have a fever and your symptoms suddenly get worse.  You start to feel mixed up (confused).  You have a very bad headache or a stiff neck.  You have very bad joint pains or stiffness.  You have jerky movements that you cannot control (seizure).  Your rash covers all or most of your body. The rash may or may not be painful.  You have blisters that: ? Are on top of the rash. ? Grow larger. ? Grow together. ? Are painful. ? Are inside your nose or mouth.  You have a rash   that: ? Looks like purple pinprick-sized spots all over your body. ? Has a "bull's eye" or looks like a target. ? Is red and painful, causes your skin to peel, and is not from being in the sun too long. Summary  A rash is a change in the color of your skin. A rash can also change the way your skin feels.  The goal of treatment is to stop the itching and keep the rash from spreading.  Take or apply over-the-counter and prescription medicines only as told by your doctor.  Contact a doctor if you have new symptoms or symptoms that get worse.  Keep all follow-up visits as told by your doctor. This is  important. This information is not intended to replace advice given to you by your health care provider. Make sure you discuss any questions you have with your health care provider. Document Revised: 03/26/2019 Document Reviewed: 07/06/2018 Elsevier Patient Education  2020 [employer]  

## 2020-07-04 ENCOUNTER — Other Ambulatory Visit: Payer: Self-pay | Admitting: Internal Medicine

## 2020-07-04 DIAGNOSIS — Z1231 Encounter for screening mammogram for malignant neoplasm of breast: Secondary | ICD-10-CM

## 2020-07-07 DIAGNOSIS — G4733 Obstructive sleep apnea (adult) (pediatric): Secondary | ICD-10-CM | POA: Diagnosis not present

## 2020-07-18 DIAGNOSIS — L821 Other seborrheic keratosis: Secondary | ICD-10-CM | POA: Diagnosis not present

## 2020-07-18 DIAGNOSIS — L738 Other specified follicular disorders: Secondary | ICD-10-CM | POA: Diagnosis not present

## 2020-07-25 DIAGNOSIS — M5136 Other intervertebral disc degeneration, lumbar region: Secondary | ICD-10-CM | POA: Insufficient documentation

## 2020-08-01 ENCOUNTER — Other Ambulatory Visit: Payer: Medicare HMO

## 2020-08-01 ENCOUNTER — Other Ambulatory Visit: Payer: Self-pay

## 2020-08-01 DIAGNOSIS — Z20822 Contact with and (suspected) exposure to covid-19: Secondary | ICD-10-CM

## 2020-08-03 ENCOUNTER — Telehealth: Payer: Self-pay

## 2020-08-03 LAB — NOVEL CORONAVIRUS, NAA: SARS-CoV-2, NAA: NOT DETECTED

## 2020-08-03 LAB — SARS-COV-2, NAA 2 DAY TAT

## 2020-08-03 NOTE — Telephone Encounter (Signed)
Pt said she was exposed to + covid at water aerobics on 07/26/20. Pt found out recently that she had been exposed to covid and was tested at [facility] on 08/01/20. My chart maintenance is going on and pt cannot look at my chart test results. Pt wants to know if Avie Echevaria NP can tell her what her covid testing results are. Pt said she has had pfizer covid vaccine on 01/20/20 and 02/10/20. Pt has been self quarantining. Pt said she feels OK, no fever or other covid symptoms. Pt request cb when results are available.

## 2020-08-03 NOTE — Telephone Encounter (Signed)
Pt is aware of negative test results and does not have any Sx and no fever

## 2020-08-04 NOTE — Telephone Encounter (Signed)
noted 

## 2020-08-18 ENCOUNTER — Ambulatory Visit
Admission: RE | Admit: 2020-08-18 | Discharge: 2020-08-18 | Disposition: A | Discharge status: Home / Self Care | Payer: Medicare HMO | Source: Ambulatory Visit | Attending: Internal Medicine | Admitting: Internal Medicine

## 2020-08-18 DIAGNOSIS — Z1231 Encounter for screening mammogram for malignant neoplasm of breast: Secondary | ICD-10-CM | POA: Diagnosis not present

## 2020-09-02 DIAGNOSIS — M5136 Other intervertebral disc degeneration, lumbar region: Secondary | ICD-10-CM | POA: Diagnosis not present

## 2020-09-06 ENCOUNTER — Ambulatory Visit (INDEPENDENT_AMBULATORY_CARE_PROVIDER_SITE_OTHER): Payer: Medicare HMO

## 2020-09-06 ENCOUNTER — Other Ambulatory Visit: Payer: Self-pay

## 2020-09-06 DIAGNOSIS — Z23 Encounter for immunization: Secondary | ICD-10-CM

## 2020-09-14 ENCOUNTER — Ambulatory Visit: Payer: Medicare HMO | Attending: Internal Medicine

## 2020-09-14 DIAGNOSIS — Z23 Encounter for immunization: Secondary | ICD-10-CM

## 2020-09-14 NOTE — Progress Notes (Signed)
   Covid-19 Vaccination Clinic  Name:  Molly Faulkner    MRN: [id number] DOB: [birth year]  09/14/2020  [name] was observed post Covid-19 immunization for 30 minutes based on pre-vaccination screening without incident. She was provided with Vaccine Information Sheet and instruction to access the V-Safe system.   [name] was instructed to call 911 with any severe reactions post vaccine: Marland Kitchen Difficulty breathing  . Swelling of face and throat  . A fast heartbeat  . A bad rash all over body  . Dizziness and weakness

## 2020-09-30 ENCOUNTER — Other Ambulatory Visit: Payer: Self-pay | Admitting: Internal Medicine

## 2020-10-04 NOTE — Telephone Encounter (Signed)
Looks like this was last filled 09/2019 and was d/c change in therapy... please advise

## 2020-10-20 DIAGNOSIS — G4733 Obstructive sleep apnea (adult) (pediatric): Secondary | ICD-10-CM | POA: Diagnosis not present

## 2020-10-26 ENCOUNTER — Ambulatory Visit (INDEPENDENT_AMBULATORY_CARE_PROVIDER_SITE_OTHER): Payer: Medicare HMO | Admitting: Internal Medicine

## 2020-10-26 ENCOUNTER — Other Ambulatory Visit: Payer: Self-pay

## 2020-10-26 ENCOUNTER — Ambulatory Visit (INDEPENDENT_AMBULATORY_CARE_PROVIDER_SITE_OTHER)
Admission: RE | Admit: 2020-10-26 | Discharge: 2020-10-26 | Disposition: A | Discharge status: Home / Self Care | Payer: Medicare HMO | Source: Ambulatory Visit | Attending: Internal Medicine | Admitting: Internal Medicine

## 2020-10-26 VITALS — BP 128/72 | HR 52 | Temp 97.7°F | Wt 183.0 lb

## 2020-10-26 DIAGNOSIS — M25511 Pain in right shoulder: Secondary | ICD-10-CM

## 2020-10-26 DIAGNOSIS — L659 Nonscarring hair loss, unspecified: Secondary | ICD-10-CM | POA: Diagnosis not present

## 2020-10-26 DIAGNOSIS — L989 Disorder of the skin and subcutaneous tissue, unspecified: Secondary | ICD-10-CM

## 2020-10-26 DIAGNOSIS — J9811 Atelectasis: Secondary | ICD-10-CM | POA: Diagnosis not present

## 2020-10-26 LAB — TSH: TSH: 1.13 u[IU]/mL (ref 0.35–4.50)

## 2020-10-26 NOTE — Progress Notes (Signed)
Subjective:    Patient ID: Molly Faulkner, female    DOB: January 03, 1941, 79 y.o.   MRN: [id number]  HPI  Pt presents to the clinic today requesting a referral to dermatology. She has a spot on her nose that she noticed 6 weeks ago.  She reports the lesion seems to be more raised than prior.  She has not used anything OTC for this.  She also c/o right shoulder pain. This started 2 months ago after injuring it during a water aerobics. She describes the pain as sharp, throbbing, tearing and achy at times. The pain is worse with movement. She denies numbness, tingling or weakness. She has a previous rotator cuff injury in the past. She has used a sling with minimal relief.  She also reports hair thinning. She reports this started a few months ago. She denies fatigue, cold intolerance, constipation.  She has not had Covid that she is aware of.  She has not noticed any bald spots.   Review of Systems      Past Medical History:  Diagnosis Date  . Allergy   . Arthritis   . Asthma   . Carpal tunnel syndrome 12/2017   left, referred to neurology for EMG testing  . 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/2017  . Chicken pox   . Depression   . Diverticulitis   . Diverticulosis   . Dysrhythmia    paroxysmal tachycardia  . GERD (gastroesophageal reflux disease)   . Hx of colonic polyps   . Hyperlipidemia   . Hypertension   . Insomnia   . 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 Sleep apnea    H/O   NO LONGER  . Syncope     Current Outpatient Medications  Medication Sig Dispense Refill  . albuterol (VENTOLIN HFA) 108 (90 Base) MCG/ACT inhaler INHALE 2 PUFFS INTO THE LUNGS EVERY 6 HOURS AS NEEDED FOR WHEEZING OR SHORTNESS OF BREATH 18 g 1  . ARNICA EX Apply 1 application topically 4 (four) times daily as needed (for arthritis pain.).    Marland Kitchen atenolol (TENORMIN) 25 MG tablet Take 1 tablet (25 mg total) by mouth 2 (two) times daily. Take 1 tablet (25 mg) by mouth twice a day 180 tablet 1  . atorvastatin (LIPITOR) 20 MG  tablet Take 1 tablet (20 mg total) by mouth daily. 30 tablet 12  . atorvastatin (LIPITOR) 20 MG tablet Take 1 tablet (20 mg total) by mouth daily. 90 tablet 3  . cholecalciferol (VITAMIN D) 1000 units tablet Take 1,000 Units by mouth every other day. At bedtime.    Marland Kitchen CREAM BASE EX Apply 1-2 application topically 4 (four) times daily as needed (for arthritis pain.). Penetrex Inflammation Formula (Arnica/Pyridoxine/MSM/Boswellia/Cetyl Myristoleate)    . fluticasone-salmeterol (ADVAIR HFA) 115-21 MCG/ACT inhaler Inhale 2 puffs into the lungs 2 (two) times daily. Inhale 1 puff into the lungs twice daily as needed 1 Inhaler 5  . hydrochlorothiazide (HYDRODIURIL) 25 MG tablet Take 1/2 tablet (12.5 mg) by mouth twice a day    . HYDROcodone-acetaminophen (NORCO/VICODIN) 5-325 MG tablet Take 1 tablet by mouth daily as needed for moderate pain. 10 tablet 0  . ibuprofen (ADVIL,MOTRIN) 200 MG tablet Take 200 mg by mouth at bedtime as needed (for arthritis pain.).    Marland Kitchen Liniments (ORTHOGEL EX) Apply 1-2 application topically 4 (four) times daily as needed (for arthritis pain relief.).    Marland Kitchen losartan (COZAAR) 50 MG tablet TAKE 1/2 TABLET IN THE MORNING  AND TAKE 1/2 TABLET  IN THE EVENING 90 tablet 1  . Magnesium Chloride POWD Apply 1 application topically 4 (four) times daily as needed (for arthritis pain.). Magnesium Oil with Arnica    . Menthol-Methyl Salicylate (PAIN RELIEVING) CREA Apply 1 application topically 4 (four) times daily as needed (for arthritis pain). EFAC PAIN RELIEVING CREAM    . mirtazapine (REMERON SOL-TAB) 15 MG disintegrating tablet DISSOLVE 1 TABLET  ON  TONGUE AT BEDTIME 20 tablet 0  . Multiple Vitamins-Minerals (EYE SUPPORT PO) Take 1 tablet by mouth at bedtime.     No current facility-administered medications for this visit.    Allergies  Allergen Reactions  . Avelox [Moxifloxacin Hcl In Nacl] Other (See Comments)    Chills  . Trazodone And Nefazodone Swelling  . Clarithromycin Other  (See Comments)    Unsure of reaction type    Family History  Problem Relation Age of Onset  . Breast cancer Mother 24  . Arthritis Maternal Grandmother   . Diabetes Maternal Grandmother     Social History   Socioeconomic History  . Marital status: Single    Spouse name: Not on file  . Number of children: Not on file  . Years of education: Not on file  . Highest education level: Not on file  Occupational History  . Not on file  Tobacco Use  . Smoking status: Former Smoker    Packs/day: 0.50    Years: 7.00    Pack years: 3.50  . Smokeless tobacco: Never Used  . Tobacco comment: quit over 40 years ago  Vaping Use  . Vaping Use: Never used  Substance and Sexual Activity  . Alcohol use: Yes    Comment: rare--liquor  . Drug use: No  . Sexual activity: Not Currently  Other Topics Concern  . Not on file  Social History Narrative  . Not on file   Social Determinants of Health   Financial Resource Strain:   . Difficulty of Paying Living Expenses: Not on file  Food Insecurity:   . Worried About Charity fundraiser in the Last Year: Not on file  . Ran Out of Food in the Last Year: Not on file  Transportation Needs:   . Lack of Transportation (Medical): Not on file  . Lack of Transportation (Non-Medical): Not on file  Physical Activity:   . Days of Exercise per Week: Not on file  . Minutes of Exercise per Session: Not on file  Stress:   . Feeling of Stress : Not on file  Social Connections:   . Frequency of Communication with Friends and Family: Not on file  . Frequency of Social Gatherings with Friends and Family: Not on file  . Attends Religious Services: Not on file  . Active Member of Clubs or Organizations: Not on file  . Attends Archivist Meetings: Not on file  . Marital Status: Not on file  Intimate Partner Violence:   . Fear of Current or Ex-Partner: Not on file  . Emotionally Abused: Not on file  . Physically Abused: Not on file  . Sexually  Abused: Not on file     Constitutional: Denies fever, malaise, fatigue, headache or abrupt weight changes.  HEENT: Patient reports hair thinning.  Denies eye pain, eye redness, ear pain, ringing in the ears, wax buildup, runny nose, nasal congestion, bloody nose, or sore throat. Respiratory: Denies difficulty breathing, shortness of breath, cough or sputum production.   Cardiovascular: Denies chest pain, chest tightness, palpitations or swelling in the  hands or feet.  Musculoskeletal: Patient reports right shoulder pain.  Denies decrease in range of motion, difficulty with gait, muscle pain or joint swelling.  Skin: Patient reports skin lesion of nose.  Denies redness, rashes, or ulcercations.  Neurological: Denies numbness, tingling, weakness of the or BUE.     No other specific complaints in a complete review of systems (except as listed in HPI above).  Objective:   Physical Exam  BP 128/72   Pulse (!) 52   Temp 97.7 F (36.5 C) (Temporal)   Wt 183 lb (83 kg)   SpO2 98%   BMI 34.58 kg/m   Wt Readings from Last 3 Encounters:  06/30/20 179 lb (81.2 kg)  01/10/20 183 lb 12 oz (83.3 kg)  10/05/19 182 lb (82.6 kg)    General: Appears her stated age, obese in NAD. Skin: 0.25 cm raised hyperpigmented lesion noted to the bridge of the nose. Cardiovascular: Bradycardic with normal rhythm.  Pulmonary/Chest: Normal effort and positive vesicular breath sounds.  Musculoskeletal: Normal internal and external rotation of the right shoulder, but slowed.  Pain with palpation over the right AC joint.  Positive drop can test on the right.  Strength 5/5 BUE.  Handgrips equal.   Neurological: Alert and oriented.    BMET    Component Value Date/Time   NA 140 01/10/2020 0950   K 4.0 01/10/2020 0950   CL 103 01/10/2020 0950   CO2 23 01/10/2020 0950   GLUCOSE 101 (H) 01/10/2020 0950   GLUCOSE 88 10/05/2019 1521   BUN 15 01/10/2020 0950   CREATININE 0.88 01/10/2020 0950   CREATININE 0.99  (H) 05/29/2018 1547   CALCIUM 10.2 01/10/2020 0950   GFRNONAA 64 01/10/2020 0950   GFRAA 74 01/10/2020 0950    Lipid Panel     Component Value Date/Time   CHOL 188 03/22/2020 1139   CHOL 203 (H) 01/10/2020 0950   TRIG 44 03/22/2020 1139   HDL 59 03/22/2020 1139   HDL 63 01/10/2020 0950   CHOLHDL 3.2 03/22/2020 1139   VLDL 9 03/22/2020 1139   LDLCALC 120 (H) 03/22/2020 1139   LDLCALC 129 (H) 01/10/2020 0950   LDLCALC 110 (H) 05/29/2018 1547    CBC    Component Value Date/Time   WBC 6.7 01/10/2020 0950   WBC 6.3 10/05/2019 1521   RBC 4.14 01/10/2020 0950   RBC 3.96 10/05/2019 1521   HGB 13.9 01/10/2020 0950   HCT 40.1 01/10/2020 0950   PLT 227 01/10/2020 0950   MCV 97 01/10/2020 0950   MCH 33.6 (H) 01/10/2020 0950   MCH 33.3 (H) 05/29/2018 1547   MCHC 34.7 01/10/2020 0950   MCHC 35.2 10/05/2019 1521   RDW 12.3 01/10/2020 0950   LYMPHSABS 1.0 01/10/2020 0950   EOSABS 0.3 01/10/2020 0950   BASOSABS 0.0 01/10/2020 0950    Hgb A1C Lab Results  Component Value Date   HGBA1C 5.1 02/20/2017           Assessment & Plan:  Skin Lesion of Nose:  Referral to dermatology for further evaluation and treatment  Right Shoulder Pain:  X-ray right shoulder today She would like referral to orthopedics for further evaluation Encouraged use of sling, ice and anti-inflammatories as needed PT may be of some benefit  Hair thinning:  No signs of fungal infection TSH today Can try biotin for T100 OTC  We will follow-up after labs and x-rays, return precautions discussed  Webb Silversmith, NP This visit occurred during the SARS-CoV-2 public  health emergency.  Safety protocols were in place, including screening questions prior to the visit, additional usage of staff PPE, and extensive cleaning of exam room while observing appropriate contact time as indicated for disinfecting solutions.

## 2020-10-29 ENCOUNTER — Encounter: Payer: Self-pay | Admitting: Internal Medicine

## 2020-10-29 NOTE — Patient Instructions (Signed)
Rotator Cuff Tear  A rotator cuff tear is a partial or complete tear of the cord-like bands (tendons) that connect muscle to bone in the rotator cuff. The rotator cuff is a group of muscles and tendons that surround the shoulder joint and keep the upper arm bone (humerus) in the shoulder socket. The tear can occur suddenly (acute tear) or can develop over a long period of time (chronic tear). What are the causes? Acute tears may be caused by:  A fall, especially on an outstretched arm.  Lifting very heavy objects with a jerking motion. Chronic tears may be caused by overuse of the muscles. This may happen in sports, physical work, or activities in which your arm repeatedly moves over your head. What increases the risk? This condition is more likely to occur in:  Athletes and workers who frequently use their shoulder or reach over their heads. This may include activities such as: ? Tennis. ? Baseball and softball. ? Swimming and rowing. ? Weightlifting. ? Construction work. ? Painting.  People who smoke.  Older people who have arthritis or poor blood supply. These can make the muscles and tendons weaker. What are the signs or symptoms? Symptoms of this condition depend on the type and severity of the injury:  An acute tear may include a sudden tearing feeling, followed by severe pain that goes from your upper shoulder, down your arm, and toward your elbow.  A chronic tear includes a gradual weakness and decreased shoulder motion as the pain gets worse. The pain is usually worse at night. Both types may have symptoms such as:  Pain that spreads (radiates) from the shoulder to the upper arm.  Swelling and tenderness in front of the shoulder.  Decreased range of motion.  Pain when: ? Reaching, pulling, or lifting the arm above the head. ? Lowering the arm from above the head.  Not being able to raise your arm out to the side.  Difficulty placing the arm behind your back. How  is this diagnosed? This condition is diagnosed with a medical history and physical exam. Imaging tests may also be done, including:  X-rays.  MRI.  Ultrasound.  CT or MR arthrogram. During this test, a contrast material is injected into your shoulder and then images are taken. How is this treated? Treatment for this condition depends on the type and severity of the condition. In less severe cases, treatment may include:  Rest. This may be done with a sling that holds the shoulder still (immobilization). Your health care provider may also recommend avoiding activities that involve lifting your arm over your head.  Icing the shoulder.  Anti-inflammatory medicines, such as aspirin or ibuprofen.  Strengthening and stretching exercises. Your health care provider may recommend specific exercises to improve your range of motion and strengthen your shoulder. In more severe cases, treatment may include:  Physical therapy.  Steroid injections.  Surgery. Follow these instructions at home: Managing pain, stiffness, and swelling  If directed, put ice on the injured area. ? If you have a removable sling, remove it as told by your health care provider. ? Put ice in a plastic bag. ? Place a towel between your skin and the bag. ? Leave the ice on for 20 minutes, 2-3 times a day.  Raise (elevate) the injured area above the level of your heart while you are lying down.  Find a comfortable sleeping position or sleep on a recliner, if available.  Move your fingers often to avoid stiffness   and to lessen swelling.  Once the swelling has gone down, your health care provider may direct you to apply heat to relax the muscles. Use the heat source that your health care provider recommends, such as a moist heat pack or a heating pad. ? Place a towel between your skin and the heat source. ? Leave the heat on for 20-30 minutes. ? Remove the heat if your skin turns bright red. This is especially  important if you are unable to feel pain, heat, or cold. You may have a greater risk of getting burned. If you have a sling:  Wear the sling as told by your health care provider. Remove it only as told by your health care provider.  Loosen the sling if your fingers tingle, become numb, or turn cold and blue.  Keep the sling clean.  If the sling is not waterproof: ? Do not let it get wet. ? Cover it with a watertight covering when you take a bath or a shower. Driving  Do not drive or use heavy machinery while taking prescription pain medicine.  Ask your health care provider when it is safe to drive if you have a sling on your arm. Activity  Rest your shoulder as told by your health care provider.  Return to your normal activities as told by your health care provider. Ask your health care provider what activities are safe for you.  Do any exercises or stretches as told by your health care provider. General instructions  Do not use any products that contain nicotine or tobacco, such as cigarettes and e-cigarettes. If you need help quitting, ask your health care provider.  Take over-the-counter and prescription medicines only as told by your health care provider.  Keep all follow-up visits as told by your health care provider. This is important. Contact a health care provider if:  Your pain gets worse.  You have new pain in your arm, hands, or fingers.  Medicine does not help your pain. Get help right away if:  Your arm, hand, or fingers are numb or tingling.  Your arm, hand, or fingers are swollen or painful or they turn white or blue.  Your hand or fingers on your injured arm are colder than your other hand. Summary  A rotator cuff tear is a partial or complete tear of the cord-like bands (tendons) that connect muscle to bone in the rotator cuff.  The tear can occur suddenly (acute tear) or can develop over a long period of time (chronic tear).  Treatment generally  includes rest, anti-inflammatory medicines, and icing. In some cases, physical therapy and steroid injections may be needed. In severe cases, surgery may be needed. This information is not intended to replace advice given to you by your health care provider. Make sure you discuss any questions you have with your health care provider. Document Revised: 11/14/2017 Document Reviewed: 02/17/2017 Elsevier Patient Education  [place].

## 2020-11-01 DIAGNOSIS — M7521 Bicipital tendinitis, right shoulder: Secondary | ICD-10-CM | POA: Diagnosis not present

## 2020-11-01 DIAGNOSIS — M7541 Impingement syndrome of right shoulder: Secondary | ICD-10-CM | POA: Diagnosis not present

## 2020-11-17 ENCOUNTER — Telehealth: Payer: Self-pay

## 2020-11-17 DIAGNOSIS — M7541 Impingement syndrome of right shoulder: Secondary | ICD-10-CM | POA: Diagnosis not present

## 2020-11-17 DIAGNOSIS — M25511 Pain in right shoulder: Secondary | ICD-10-CM | POA: Diagnosis not present

## 2020-11-17 NOTE — Telephone Encounter (Signed)
Pt already has appt with Avie Echevaria NP on 11/21/20.

## 2020-11-17 NOTE — Telephone Encounter (Signed)
Could she send a picture through mychart?

## 2020-11-17 NOTE — Telephone Encounter (Signed)
[facility] - Client TELEPHONE ADVICE RECORD [facility] Patient Name: Molly Faulkner Gender: Female DOB: February 13, 1941 Age: 79 Y 57 M 22 D Return Phone Number: [phone] (Primary) Address: City/State/Zip: [place] [address] Client [facility] - Client Client Site [facility] - Day Physician Webb Silversmith - NP Contact Type Call Who Is Calling Patient / Member / Family / Caregiver Call Type Triage / Clinical Relationship To Patient Self Return Phone Number [phone] (Primary) Chief Complaint Facial Swelling Reason for Call Symptomatic / Request for [place] states she has blisters on her lips with swelling. Translation No Nurse Assessment Nurse: Wynetta Emery, RN, Baker Janus Date/Time Eilene Ghazi Time): 11/17/2020 10:12:34 AM Confirm and document reason for call. If symptomatic, describe symptoms. ---Clarene Critchley had blisters on her lip and swelling during Thanksgiving week and stopped all her medications thinking they caused it. Now worried the medications have caused her damage. Does the patient have any new or worsening symptoms? ---Yes Will a triage be completed? ---Yes Related visit to physician within the last 2 weeks? ---No Does the PT have any chronic conditions? (i.e. diabetes, asthma, this includes High risk factors for pregnancy, etc.) ---Yes List chronic conditions. ---HTN Neuropathy Asthma lower back/hip issues Is this a behavioral health or substance abuse call? ---No Disp. Time Eilene Ghazi Time) Disposition Final User 11/17/2020 10:27:34 AM Clinical Call Yes Wynetta Emery, RN, Baker Janus

## 2020-11-21 ENCOUNTER — Encounter: Payer: Self-pay | Admitting: Internal Medicine

## 2020-11-21 ENCOUNTER — Other Ambulatory Visit: Payer: Self-pay

## 2020-11-21 ENCOUNTER — Ambulatory Visit (INDEPENDENT_AMBULATORY_CARE_PROVIDER_SITE_OTHER): Payer: Medicare HMO | Admitting: Internal Medicine

## 2020-11-21 ENCOUNTER — Telehealth: Payer: Self-pay | Admitting: Cardiovascular Disease

## 2020-11-21 VITALS — BP 148/78 | HR 49 | Temp 97.7°F | Wt 184.0 lb

## 2020-11-21 DIAGNOSIS — M25552 Pain in left hip: Secondary | ICD-10-CM | POA: Diagnosis not present

## 2020-11-21 DIAGNOSIS — G8929 Other chronic pain: Secondary | ICD-10-CM | POA: Diagnosis not present

## 2020-11-21 DIAGNOSIS — R220 Localized swelling, mass and lump, head: Secondary | ICD-10-CM

## 2020-11-21 DIAGNOSIS — M1612 Unilateral primary osteoarthritis, left hip: Secondary | ICD-10-CM | POA: Diagnosis not present

## 2020-11-21 MED ORDER — HYDROCODONE-ACETAMINOPHEN 5-325 MG PO TABS: 1.0000 | ORAL_TABLET | Freq: Every day | ORAL | 0 refills | Status: DC | PRN

## 2020-11-21 NOTE — Progress Notes (Signed)
Subjective:    Patient ID: Molly Faulkner, female    DOB: Dec 26, 1940, 79 y.o.   MRN: [id number]  HPI  Pt presents to the clinic today with c/o swelling of her lips. She noticed this the week of thanksgiving, lasted about 2 days. She reports an associated burning sensation of her lips but denies swelling of her eyes, tongue or throat. She denies difficulty swallowing. She ended up stopping a few of her meds like Gabapentin, plant sterols and HCTZ. She restarted her HCTZ this morning. She has never had swelling like this in the past. She denies changes in diet or medications. She reports swelling improved without intervention.  She also wants to let me know she is having a left hip replacement 01/01/21. She will need a refill of Hydrocodone to get her through until the surgery.  Review of Systems      Past Medical History:  Diagnosis Date  . Allergy   . Arthritis   . Asthma   . Carpal tunnel syndrome 12/2017   left, referred to neurology for EMG testing  . 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/2017  . Chicken pox   . Depression   . Diverticulitis   . Diverticulosis   . Dysrhythmia    paroxysmal tachycardia  . GERD (gastroesophageal reflux disease)   . Hx of colonic polyps   . Hyperlipidemia   . Hypertension   . Insomnia   . 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 Sleep apnea    H/O   NO LONGER  . Syncope     Current Outpatient Medications  Medication Sig Dispense Refill  . albuterol (VENTOLIN HFA) 108 (90 Base) MCG/ACT inhaler INHALE 2 PUFFS INTO THE LUNGS EVERY 6 HOURS AS NEEDED FOR WHEEZING OR SHORTNESS OF BREATH 18 g 1  . ARNICA EX Apply 1 application topically 4 (four) times daily as needed (for arthritis pain.).    Marland Kitchen atenolol (TENORMIN) 25 MG tablet Take 1 tablet (25 mg total) by mouth 2 (two) times daily. Take 1 tablet (25 mg) by mouth twice a day 180 tablet 1  . atorvastatin (LIPITOR) 20 MG tablet Take 1 tablet (20 mg total) by mouth daily. 30 tablet 12  . atorvastatin (LIPITOR) 20 MG tablet  Take 1 tablet (20 mg total) by mouth daily. 90 tablet 3  . cholecalciferol (VITAMIN D) 1000 units tablet Take 1,000 Units by mouth every other day. At bedtime.    Marland Kitchen CREAM BASE EX Apply 1-2 application topically 4 (four) times daily as needed (for arthritis pain.). Penetrex Inflammation Formula (Arnica/Pyridoxine/MSM/Boswellia/Cetyl Myristoleate)    . fluticasone-salmeterol (ADVAIR HFA) 115-21 MCG/ACT inhaler Inhale 2 puffs into the lungs 2 (two) times daily. Inhale 1 puff into the lungs twice daily as needed 1 Inhaler 5  . hydrochlorothiazide (HYDRODIURIL) 25 MG tablet Take 1/2 tablet (12.5 mg) by mouth twice a day    . HYDROcodone-acetaminophen (NORCO/VICODIN) 5-325 MG tablet Take 1 tablet by mouth daily as needed for moderate pain. 10 tablet 0  . ibuprofen (ADVIL,MOTRIN) 200 MG tablet Take 200 mg by mouth at bedtime as needed (for arthritis pain.).    Marland Kitchen Liniments (ORTHOGEL EX) Apply 1-2 application topically 4 (four) times daily as needed (for arthritis pain relief.).    Marland Kitchen losartan (COZAAR) 50 MG tablet TAKE 1/2 TABLET IN THE MORNING  AND TAKE 1/2 TABLET IN THE EVENING 90 tablet 1  . Magnesium Chloride POWD Apply 1 application topically 4 (four) times daily as needed (for arthritis pain.). Magnesium Oil with  Arnica    . Menthol-Methyl Salicylate (PAIN RELIEVING) CREA Apply 1 application topically 4 (four) times daily as needed (for arthritis pain). EFAC PAIN RELIEVING CREAM    . mirtazapine (REMERON SOL-TAB) 15 MG disintegrating tablet DISSOLVE 1 TABLET  ON  TONGUE AT BEDTIME 20 tablet 0  . Multiple Vitamins-Minerals (EYE SUPPORT PO) Take 1 tablet by mouth at bedtime.     No current facility-administered medications for this visit.    Allergies  Allergen Reactions  . Avelox [Moxifloxacin Hcl In Nacl] Other (See Comments)    Chills  . Trazodone And Nefazodone Swelling  . Clarithromycin Other (See Comments)    Unsure of reaction type    Family History  Problem Relation Age of Onset  .  Breast cancer Mother 45  . Arthritis Maternal Grandmother   . Diabetes Maternal Grandmother     Social History   Socioeconomic History  . Marital status: Single    Spouse name: Not on file  . Number of children: Not on file  . Years of education: Not on file  . Highest education level: Not on file  Occupational History  . Not on file  Tobacco Use  . Smoking status: Former Smoker    Packs/day: 0.50    Years: 7.00    Pack years: 3.50  . Smokeless tobacco: Never Used  . Tobacco comment: quit over 40 years ago  Vaping Use  . Vaping Use: Never used  Substance and Sexual Activity  . Alcohol use: Yes    Comment: rare--liquor  . Drug use: No  . Sexual activity: Not Currently  Other Topics Concern  . Not on file  Social History Narrative  . Not on file   Social Determinants of Health   Financial Resource Strain:   . Difficulty of Paying Living Expenses: Not on file  Food Insecurity:   . Worried About Charity fundraiser in the Last Year: Not on file  . Ran Out of Food in the Last Year: Not on file  Transportation Needs:   . Lack of Transportation (Medical): Not on file  . Lack of Transportation (Non-Medical): Not on file  Physical Activity:   . Days of Exercise per Week: Not on file  . Minutes of Exercise per Session: Not on file  Stress:   . Feeling of Stress : Not on file  Social Connections:   . Frequency of Communication with Friends and Family: Not on file  . Frequency of Social Gatherings with Friends and Family: Not on file  . Attends Religious Services: Not on file  . Active Member of Clubs or Organizations: Not on file  . Attends Archivist Meetings: Not on file  . Marital Status: Not on file  Intimate Partner Violence:   . Fear of Current or Ex-Partner: Not on file  . Emotionally Abused: Not on file  . Physically Abused: Not on file  . Sexually Abused: Not on file     Constitutional: Denies fever, malaise, fatigue, headache or abrupt weight  changes.  HEENT: Denies eye pain, eye redness, ear pain, ringing in the ears, wax buildup, runny nose, nasal congestion, bloody nose, or sore throat. Respiratory: Denies difficulty breathing, shortness of breath, cough or sputum production.   Cardiovascular: Denies chest pain, chest tightness, palpitations or swelling in the hands or feet.  Musculoskeletal: Pt reports left hip pain. Denies decrease in range of motion, difficulty with gait, muscle pain or joint swelling.  Skin: Pt reports fever blisters. Denies redness,  rashes.    No other specific complaints in a complete review of systems (except as listed in HPI above)..  Objective:   Physical Exam  BP (!) 148/78   Pulse (!) 49   Temp 97.7 F (36.5 C) (Temporal)   Wt 184 lb (83.5 kg)   SpO2 98%   BMI 34.77 kg/m   Wt Readings from Last 3 Encounters:  10/26/20 183 lb (83 kg)  06/30/20 179 lb (81.2 kg)  01/10/20 183 lb 12 oz (83.3 kg)    General: Appears her stated age, well developed, well nourished in NAD. Skin: Warm, dry and intact. No rashes noted. HEENT: Head: normal shape and size; EThroat/Mouth: Teeth present, mucosa pink and moist, no exudate, lesions or ulcerations noted.  Neck:  Neck supple, trachea midline. No masses, lumps or thyromegaly present.  Cardiovascular: Bradycardic. Pulmonary/Chest: Normal effort.  Musculoskeletal: Gait slow and steady without device. Neurological: Alert and oriented.   BMET    Component Value Date/Time   NA 140 01/10/2020 0950   K 4.0 01/10/2020 0950   CL 103 01/10/2020 0950   CO2 23 01/10/2020 0950   GLUCOSE 101 (H) 01/10/2020 0950   GLUCOSE 88 10/05/2019 1521   BUN 15 01/10/2020 0950   CREATININE 0.88 01/10/2020 0950   CREATININE 0.99 (H) 05/29/2018 1547   CALCIUM 10.2 01/10/2020 0950   GFRNONAA 64 01/10/2020 0950   GFRAA 74 01/10/2020 0950    Lipid Panel     Component Value Date/Time   CHOL 188 03/22/2020 1139   CHOL 203 (H) 01/10/2020 0950   TRIG 44 03/22/2020 1139    HDL 59 03/22/2020 1139   HDL 63 01/10/2020 0950   CHOLHDL 3.2 03/22/2020 1139   VLDL 9 03/22/2020 1139   LDLCALC 120 (H) 03/22/2020 1139   LDLCALC 129 (H) 01/10/2020 0950   LDLCALC 110 (H) 05/29/2018 1547    CBC    Component Value Date/Time   WBC 6.7 01/10/2020 0950   WBC 6.3 10/05/2019 1521   RBC 4.14 01/10/2020 0950   RBC 3.96 10/05/2019 1521   HGB 13.9 01/10/2020 0950   HCT 40.1 01/10/2020 0950   PLT 227 01/10/2020 0950   MCV 97 01/10/2020 0950   MCH 33.6 (H) 01/10/2020 0950   MCH 33.3 (H) 05/29/2018 1547   MCHC 34.7 01/10/2020 0950   MCHC 35.2 10/05/2019 1521   RDW 12.3 01/10/2020 0950   LYMPHSABS 1.0 01/10/2020 0950   EOSABS 0.3 01/10/2020 0950   BASOSABS 0.0 01/10/2020 0950    Hgb A1C Lab Results  Component Value Date   HGBA1C 5.1 02/20/2017            Assessment & Plan:   Lip Swelling:  Could be from Losartan more than any other meds She will not restart Gabapentin or plant sterols She has already restarted HCTZ Will continue to monitor Encouraged her to keep Benadryl on hand in case this happens again Will monitor for now  Chronic Left Hip Pain:  Upcoming surgery schedule Norco refilled today  Return precautions discussed Webb Silversmith, NP This visit occurred during the SARS-CoV-2 public health emergency.  Safety protocols were in place, including screening questions prior to the visit, additional usage of staff PPE, and extensive cleaning of exam room while observing appropriate contact time as indicated for disinfecting solutions.

## 2020-11-21 NOTE — Patient Instructions (Signed)
Angioedema  Angioedema is sudden swelling in the body. The swelling can happen in any part of the body. It often happens on the skin and causes itchy, bumpy patches (hives) to form. This condition may:  Happen only one time.  Happen more than one time. It may come back at random times.  Keep coming back for a number of years. Someday it may stop coming back. Follow these instructions at home:  Take over-the-counter and prescription medicines only as told by your doctor.  If you were given medicines for emergency allergy treatment, always carry them with you.  Wear a medical bracelet as told by your doctor.  Avoid the things that cause your attacks (triggers).  If this condition was passed to you from your parents and you want to have kids, talk to your doctor. Your kids may also have this condition. Contact a doctor if:  You have another attack.  Your attacks happen more often, even after you take steps to prevent them.  This condition was passed to you by your parents and you want to have kids. Get help right away if:  Your mouth, tongue, or lips get very swollen.  You have trouble breathing.  You have trouble swallowing.  You pass out (faint). This information is not intended to replace advice given to you by your health care provider. Make sure you discuss any questions you have with your health care provider. Document Revised: 11/14/2017 Document Reviewed: 06/11/2016 Elsevier Patient Education  2020 Elsevier Inc.  

## 2020-11-21 NOTE — Telephone Encounter (Signed)
   [facility] Pre-operative Risk Assessment    HEARTCARE STAFF: - Please ensure there is not already an duplicate clearance open for this procedure. - Under Visit Info/Reason for Call, type in Other and utilize the format Clearance MM/DD/YY or Clearance TBD. Do not use dashes or single digits. - If request is for dental extraction, please clarify the # of teeth to be extracted.  Request for surgical clearance:  1. What type of surgery is being performed? Lt THA interior hip   2. When is this surgery scheduled? 01-01-21   3. What type of clearance is required (medical clearance vs. Pharmacy clearance to hold med vs. Both)? both  4. Are there any medications that need to be held prior to surgery and how long?please advise    5. Practice name and name of physician performing surgery? [facility]    6. What is the office phone number?  [phone]   7.   What is the office fax number? [phone]  8.   Anesthesia type (None, local, MAC, general) ? Local spinal    Clarisse Gouge 11/21/2020, 4:54 PM  _________________________________________________________________   (provider comments below)

## 2020-11-22 NOTE — Telephone Encounter (Signed)
   Primary Cardiologist: Ida Rogue, MD  Chart reviewed as part of pre-operative protocol coverage. Patient has not been seen in our office since 01/10/2020. Therefore, she will require a follow-up visit in order to better assess preoperative cardiovascular risk.  Pre-op covering staff: - Please schedule appointment and call patient to inform them. If patient already had an upcoming appointment within acceptable timeframe, please add "pre-op clearance" to the appointment notes so provider is aware. - Please contact requesting surgeon's office via preferred method (i.e, phone, fax) to inform them of need for appointment prior to surgery.   Darreld Mclean, PA-C  11/22/2020, 8:15 AM

## 2020-11-22 NOTE — Telephone Encounter (Signed)
Tried to call the pt to set up pre op appt, no answer, no vm came on. Could not leave message.

## 2020-11-23 NOTE — Telephone Encounter (Signed)
Forwarded to requesting providers office via [place] fax function

## 2020-11-23 NOTE — Telephone Encounter (Signed)
Called pt and scheduled an appt for clearance 12-25-2020 she will arrive early wearing a mask

## 2020-11-27 ENCOUNTER — Telehealth: Payer: Self-pay | Admitting: Internal Medicine

## 2020-11-27 ENCOUNTER — Other Ambulatory Visit: Payer: Self-pay | Admitting: Family

## 2020-11-27 DIAGNOSIS — M7541 Impingement syndrome of right shoulder: Secondary | ICD-10-CM | POA: Diagnosis not present

## 2020-11-27 DIAGNOSIS — S46011A Strain of muscle(s) and tendon(s) of the rotator cuff of right shoulder, initial encounter: Secondary | ICD-10-CM | POA: Diagnosis not present

## 2020-11-27 NOTE — Telephone Encounter (Signed)
Lm for [facility] with [facility].

## 2020-11-28 NOTE — Telephone Encounter (Signed)
Spoke to Wall, who is calling for update on surgical clearance form that was faxed on 11/22/2020. We have not received this fax.  I have requested that she refax surgical clearance form.

## 2020-11-28 NOTE — Telephone Encounter (Signed)
Surgical clearance form has been received and placed in Dr. Zoila Shutter folder for review.

## 2020-11-29 DIAGNOSIS — M25511 Pain in right shoulder: Secondary | ICD-10-CM | POA: Diagnosis not present

## 2020-11-29 DIAGNOSIS — M7541 Impingement syndrome of right shoulder: Secondary | ICD-10-CM | POA: Diagnosis not present

## 2020-11-30 NOTE — Telephone Encounter (Signed)
Pre op appt scheduled for 12/21

## 2020-11-30 NOTE — Telephone Encounter (Signed)
Access nurse calling back about surgical clearance forms  Please advise. EM

## 2020-12-04 DIAGNOSIS — M25511 Pain in right shoulder: Secondary | ICD-10-CM | POA: Diagnosis not present

## 2020-12-04 DIAGNOSIS — M7541 Impingement syndrome of right shoulder: Secondary | ICD-10-CM | POA: Diagnosis not present

## 2020-12-04 NOTE — Telephone Encounter (Signed)
Completed Surgical clearance form has been faxed to [facility].  Patient has been scheduled for rov on 12/20/2020 at 11:30 with Beth.   Nothing further needed.

## 2020-12-05 ENCOUNTER — Other Ambulatory Visit: Payer: Self-pay

## 2020-12-05 ENCOUNTER — Ambulatory Visit (INDEPENDENT_AMBULATORY_CARE_PROVIDER_SITE_OTHER): Payer: Medicare HMO | Admitting: Internal Medicine

## 2020-12-05 VITALS — BP 140/74 | HR 68 | Temp 98.2°F | Ht 61.0 in | Wt 182.8 lb

## 2020-12-05 DIAGNOSIS — Z01810 Encounter for preprocedural cardiovascular examination: Secondary | ICD-10-CM | POA: Diagnosis not present

## 2020-12-05 DIAGNOSIS — I10 Essential (primary) hypertension: Secondary | ICD-10-CM

## 2020-12-05 DIAGNOSIS — M1612 Unilateral primary osteoarthritis, left hip: Secondary | ICD-10-CM | POA: Diagnosis not present

## 2020-12-05 LAB — POCT URINALYSIS DIPSTICK
Bilirubin, UA: NEGATIVE
Glucose, UA: NEGATIVE
Ketones, UA: NEGATIVE
Nitrite, UA: NEGATIVE
Protein, UA: NEGATIVE
Spec Grav, UA: >=1.03 — AB (ref 1.010–1.025)
Urobilinogen, UA: NEGATIVE E.U./dL — AB
pH, UA: 5.5 (ref 5.0–8.0)

## 2020-12-05 NOTE — Progress Notes (Signed)
Subjective:    Patient ID: Molly Faulkner, female    DOB: 02-26-1941, 79 y.o.   MRN: [id number]  HPI  Pt presents to the clinic today for preop clearance. She is having a left total hip arthroplasty by Dr Harlow Mares scheduled for 01/02/20. She reports persistent left hip pain.  She does report some concerns about her blood pressure. She has noticed it has been higher lately. Her BP today is 164/72. She did just come from PT and is some significant pain. She is currently taking Atenolol, Losartan and HCTZ (only taking 1/2 tab HCTZ in the am).  Review of Systems      Past Medical History:  Diagnosis Date  . Allergy   . Arthritis   . Asthma   . Carpal tunnel syndrome 12/2017   left, referred to neurology for EMG testing  . 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/2017  . Chicken pox   . Depression   . Diverticulitis   . Diverticulosis   . Dysrhythmia    paroxysmal tachycardia  . GERD (gastroesophageal reflux disease)   . Hx of colonic polyps   . Hyperlipidemia   . Hypertension   . Insomnia   . 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 Sleep apnea    H/O   NO LONGER  . Syncope     Current Outpatient Medications  Medication Sig Dispense Refill  . albuterol (VENTOLIN HFA) 108 (90 Base) MCG/ACT inhaler INHALE 2 PUFFS INTO THE LUNGS EVERY 6 HOURS AS NEEDED FOR WHEEZING OR SHORTNESS OF BREATH 18 g 1  . ARNICA EX Apply 1 application topically 4 (four) times daily as needed (for arthritis pain.).    Marland Kitchen atenolol (TENORMIN) 25 MG tablet Take 1 tablet (25 mg total) by mouth 2 (two) times daily. Take 1 tablet (25 mg) by mouth twice a day 180 tablet 1  . cholecalciferol (VITAMIN D) 1000 units tablet Take 1,000 Units by mouth every other day. At bedtime.    Marland Kitchen CREAM BASE EX Apply 1-2 application topically 4 (four) times daily as needed (for arthritis pain.). Penetrex Inflammation Formula (Arnica/Pyridoxine/MSM/Boswellia/Cetyl Myristoleate)    . fluticasone-salmeterol (ADVAIR HFA) 115-21 MCG/ACT inhaler Inhale 2 puffs into the  lungs 2 (two) times daily. Inhale 1 puff into the lungs twice daily as needed 1 Inhaler 5  . hydrochlorothiazide (HYDRODIURIL) 25 MG tablet Take 1/2 tablet (12.5 mg) by mouth twice a day    . HYDROcodone-acetaminophen (NORCO/VICODIN) 5-325 MG tablet Take 1 tablet by mouth daily as needed for moderate pain. 15 tablet 0  . ibuprofen (ADVIL,MOTRIN) 200 MG tablet Take 200 mg by mouth at bedtime as needed (for arthritis pain.).    Marland Kitchen Liniments (ORTHOGEL EX) Apply 1-2 application topically 4 (four) times daily as needed (for arthritis pain relief.). (Patient not taking: Reported on 11/21/2020)    . losartan (COZAAR) 50 MG tablet TAKE 1/2 TABLET IN THE MORNING  AND TAKE 1/2 TABLET IN THE EVENING 90 tablet 1  . Magnesium Chloride POWD Apply 1 application topically 4 (four) times daily as needed (for arthritis pain.). Magnesium Oil with Arnica    . Menthol-Methyl Salicylate (PAIN RELIEVING) CREA Apply 1 application topically 4 (four) times daily as needed (for arthritis pain). [facility] PAIN RELIEVING CREAM (Patient not taking: Reported on 11/21/2020)    . mirtazapine (REMERON SOL-TAB) 15 MG disintegrating tablet DISSOLVE 1 TABLET  ON  TONGUE AT BEDTIME 20 tablet 0   No current facility-administered medications for this visit.    Allergies  Allergen Reactions  .  Avelox [Moxifloxacin Hcl In Nacl] Other (See Comments)    Chills  . Trazodone And Nefazodone Swelling  . Clarithromycin Other (See Comments)    Unsure of reaction type    Family History  Problem Relation Age of Onset  . Breast cancer Mother 23  . Arthritis Maternal Grandmother   . Diabetes Maternal Grandmother     Social History   Socioeconomic History  . Marital status: Single    Spouse name: Not on file  . Number of children: Not on file  . Years of education: Not on file  . Highest education level: Not on file  Occupational History  . Not on file  Tobacco Use  . Smoking status: Former Smoker    Packs/day: 0.50    Years: 7.00     Pack years: 3.50  . Smokeless tobacco: Never Used  . Tobacco comment: quit over 40 years ago  Vaping Use  . Vaping Use: Never used  Substance and Sexual Activity  . Alcohol use: Yes    Comment: rare--liquor  . Drug use: No  . Sexual activity: Not Currently  Other Topics Concern  . Not on file  Social History Narrative  . Not on file   Social Determinants of Health   Financial Resource Strain: Not on file  Food Insecurity: Not on file  Transportation Needs: Not on file  Physical Activity: Not on file  Stress: Not on file  Social Connections: Not on file  Intimate Partner Violence: Not on file     Constitutional: Denies fever, malaise, fatigue, headache or abrupt weight changes.  Respiratory: Denies difficulty breathing, shortness of breath, cough or sputum production.   Cardiovascular: Denies chest pain, chest tightness, palpitations or swelling in the hands or feet.  Musculoskeletal: Pt reports chronic left hip pain. Denies decrease in range of motion, difficulty with gait, muscle pain or joint swelling.  Neurological: Denies numbness, tinging, weakness or problems with balance and coordination.  Psych: Denies anxiety, depression, SI/HI.  No other specific complaints in a complete review of systems (except as listed in HPI above).  Objective:   Physical Exam BP 140/74 (BP Location: Left Arm, Patient Position: Sitting, Cuff Size: Large)   Pulse 68   Temp 98.2 F (36.8 C) (Temporal)   Ht 5\' 1"  (1.549 m)   Wt 182 lb 12.8 oz (82.9 kg)   SpO2 97%   BMI 34.54 kg/m    Wt Readings from Last 3 Encounters:  11/21/20 184 lb (83.5 kg)  10/26/20 183 lb (83 kg)  06/30/20 179 lb (81.2 kg)    General: Appears her stated age, obese, in NAD. Cardiovascular: Normal rate and rhythm.  Pulmonary/Chest: Normal effort and positive vesicular breath sounds. No respiratory distress. No wheezes, rales or ronchi noted.  Musculoskeletal: Normal abduction, adduction of the left hip.  Decreased and painful internal and external rotation of the left hip. Strength 5/5 BLE. Gait slow and steady without device. Neurological: Alert and oriented.    BMET    Component Value Date/Time   NA 140 01/10/2020 0950   K 4.0 01/10/2020 0950   CL 103 01/10/2020 0950   CO2 23 01/10/2020 0950   GLUCOSE 101 (H) 01/10/2020 0950   GLUCOSE 88 10/05/2019 1521   BUN 15 01/10/2020 0950   CREATININE 0.88 01/10/2020 0950   CREATININE 0.99 (H) 05/29/2018 1547   CALCIUM 10.2 01/10/2020 0950   GFRNONAA 64 01/10/2020 0950   GFRAA 74 01/10/2020 0950    Lipid Panel  Component Value Date/Time   CHOL 188 03/22/2020 1139   CHOL 203 (H) 01/10/2020 0950   TRIG 44 03/22/2020 1139   HDL 59 03/22/2020 1139   HDL 63 01/10/2020 0950   CHOLHDL 3.2 03/22/2020 1139   VLDL 9 03/22/2020 1139   LDLCALC 120 (H) 03/22/2020 1139   LDLCALC 129 (H) 01/10/2020 0950   LDLCALC 110 (H) 05/29/2018 1547    CBC    Component Value Date/Time   WBC 6.7 01/10/2020 0950   WBC 6.3 10/05/2019 1521   RBC 4.14 01/10/2020 0950   RBC 3.96 10/05/2019 1521   HGB 13.9 01/10/2020 0950   HCT 40.1 01/10/2020 0950   PLT 227 01/10/2020 0950   MCV 97 01/10/2020 0950   MCH 33.6 (H) 01/10/2020 0950   MCH 33.3 (H) 05/29/2018 1547   MCHC 34.7 01/10/2020 0950   MCHC 35.2 10/05/2019 1521   RDW 12.3 01/10/2020 0950   LYMPHSABS 1.0 01/10/2020 0950   EOSABS 0.3 01/10/2020 0950   BASOSABS 0.0 01/10/2020 0950    Hgb A1C Lab Results  Component Value Date   HGBA1C 5.1 02/20/2017          Assessment & Plan:   Preoperative Clearance, OA Left Hip:  Indication for ECG: preoperative clearance Interpretation of ECG: normal rate and rhythm, no acute findings. Comparison of ECG: 12/2019, unchanged Will check CBC, CMET, Albumin. PT/INR, A1C and urinalysis Form will be completed once results available and faxed to Dr. Harlow Mares office  HTN:  Recheck 140/272 Continue current meds (take 1 tab of HCTZ in am) Reinforced DASH  diet and exercise for weight loss  Return precautions discussed Webb Silversmith, NP This visit occurred during the SARS-CoV-2 public health emergency.  Safety protocols were in place, including screening questions prior to the visit, additional usage of staff PPE, and extensive cleaning of exam room while observing appropriate contact time as indicated for disinfecting solutions.

## 2020-12-06 LAB — COMPREHENSIVE METABOLIC PANEL
ALT: 12 U/L (ref 0–35)
AST: 13 U/L (ref 0–37)
Albumin: 4.2 g/dL (ref 3.5–5.2)
Alkaline Phosphatase: 72 U/L (ref 39–117)
BUN: 15 mg/dL (ref 6–23)
CO2: 30 mEq/L (ref 19–32)
Calcium: 10.2 mg/dL (ref 8.4–10.5)
Chloride: 104 mEq/L (ref 96–112)
Creatinine, Ser: 0.91 mg/dL (ref 0.40–1.20)
GFR: 60.84 mL/min (ref >60.00)
Glucose, Bld: 86 mg/dL (ref 70–99)
Potassium: 3.7 mEq/L (ref 3.5–5.1)
Sodium: 139 mEq/L (ref 135–145)
Total Bilirubin: 1.4 mg/dL — ABNORMAL HIGH (ref 0.2–1.2)
Total Protein: 7 g/dL (ref 6.0–8.3)

## 2020-12-06 LAB — ALBUMIN: Albumin: 4.2 g/dL (ref 3.5–5.2)

## 2020-12-06 LAB — HEMOGLOBIN A1C: Hgb A1c MFr Bld: 5 % (ref 4.6–6.5)

## 2020-12-06 LAB — CBC
HCT: 38.4 % (ref 36.0–46.0)
Hemoglobin: 13.5 g/dL (ref 12.0–15.0)
MCHC: 35.2 g/dL (ref 30.0–36.0)
MCV: 93.1 fl (ref 78.0–100.0)
Platelets: 202 10*3/uL (ref 150.0–400.0)
RBC: 4.13 Mil/uL (ref 3.87–5.11)
RDW: 12.6 % (ref 11.5–15.5)
WBC: 6 10*3/uL (ref 4.0–10.5)

## 2020-12-07 ENCOUNTER — Encounter: Payer: Self-pay | Admitting: Internal Medicine

## 2020-12-07 ENCOUNTER — Other Ambulatory Visit: Payer: Self-pay

## 2020-12-07 DIAGNOSIS — Z01818 Encounter for other preprocedural examination: Secondary | ICD-10-CM

## 2020-12-07 DIAGNOSIS — M7541 Impingement syndrome of right shoulder: Secondary | ICD-10-CM | POA: Diagnosis not present

## 2020-12-07 DIAGNOSIS — M25511 Pain in right shoulder: Secondary | ICD-10-CM | POA: Diagnosis not present

## 2020-12-07 NOTE — Patient Instructions (Signed)
Hip Exercises Ask your health care provider which exercises are safe for you. Do exercises exactly as told by your health care provider and adjust them as directed. It is normal to feel mild stretching, pulling, tightness, or discomfort as you do these exercises. Stop right away if you feel sudden pain or your pain gets worse. Do not begin these exercises until told by your health care provider. Stretching and range-of-motion exercises These exercises warm up your muscles and joints and improve the movement and flexibility of your hip. These exercises also help to relieve pain, numbness, and tingling. You may be asked to limit your range of motion if you had a hip replacement. Talk to your health care provider about these restrictions. Hamstrings, supine  1. Lie on your back (supine position). 2. Loop a belt or towel over the ball of your left / right foot. The ball of your foot is on the walking surface, right under your toes. 3. Straighten your left / right knee and slowly pull on the belt or towel to raise your leg until you feel a gentle stretch behind your knee (hamstring). ? Do not let your knee bend while you do this. ? Keep your other leg flat on the floor. 4. Hold this position for __________ seconds. 5. Slowly return your leg to the starting position. Repeat __________ times. Complete this exercise __________ times a day. Hip rotation  1. Lie on your back on a firm surface. 2. With your left / right hand, gently pull your left / right knee toward the shoulder that is on the same side of the body. Stop when your knee is pointing toward the ceiling. 3. Hold your left / right ankle with your other hand. 4. Keeping your knee steady, gently pull your left / right ankle toward your other shoulder until you feel a stretch in your buttocks. ? Keep your hips and shoulders firmly planted while you do this stretch. 5. Hold this position for __________ seconds. Repeat __________ times. Complete  this exercise __________ times a day. Seated stretch This exercise is sometimes called hamstrings and adductors stretch. 1. Sit on the floor with your legs stretched wide. Keep your knees straight during this exercise. 2. Keeping your head and back in a straight line, bend at your waist to reach for your left foot (position A). You should feel a stretch in your right inner thigh (adductors). 3. Hold this position for __________ seconds. Then slowly return to the upright position. 4. Keeping your head and back in a straight line, bend at your waist to reach forward (position B). You should feel a stretch behind both of your thighs and knees (hamstrings). 5. Hold this position for __________ seconds. Then slowly return to the upright position. 6. Keeping your head and back in a straight line, bend at your waist to reach for your right foot (position C). You should feel a stretch in your left inner thigh (adductors). 7. Hold this position for __________ seconds. Then slowly return to the upright position. Repeat __________ times. Complete this exercise __________ times a day. Lunge This exercise stretches the muscles of the hip (hip flexors). 1. Place your left / right knee on the floor and bend your other knee so that is directly over your ankle. You should be half-kneeling. 2. Keep good posture with your head over your shoulders. 3. Tighten your buttocks to point your tailbone downward. This will prevent your back from arching too much. 4. You should feel a   gentle stretch in the front of your left / right thigh and hip. If you do not feel a stretch, slide your other foot forward slightly and then slowly lunge forward with your chest up until your knee once again lines up over your ankle. ? Make sure your tailbone continues to point downward. 5. Hold this position for __________ seconds. 6. Slowly return to the starting position. Repeat __________ times. Complete this exercise __________ times a  day. Strengthening exercises These exercises build strength and endurance in your hip. Endurance is the ability to use your muscles for a long time, even after they get tired. Bridge This exercise strengthens the muscles of your hip (hip extensors). 1. Lie on your back on a firm surface with your knees bent and your feet flat on the floor. 2. Tighten your buttocks muscles and lift your bottom off the floor until the trunk of your body and your hips are level with your thighs. ? Do not arch your back. ? You should feel the muscles working in your buttocks and the back of your thighs. If you do not feel these muscles, slide your feet 1-2 inches (2.5-5 cm) farther away from your buttocks. 3. Hold this position for __________ seconds. 4. Slowly lower your hips to the starting position. 5. Let your muscles relax completely between repetitions. Repeat __________ times. Complete this exercise __________ times a day. Straight leg raises, side-lying This exercise strengthens the muscles that move the hip joint away from the center of the body (hip abductors). 1. Lie on your side with your left / right leg in the top position. Lie so your head, shoulder, hip, and knee line up. You may bend your bottom knee slightly to help you balance. 2. Roll your hips slightly forward, so your hips are stacked directly over each other and your left / right knee is facing forward. 3. Leading with your heel, lift your top leg 4-6 inches (10-15 cm). You should feel the muscles in your top hip lifting. ? Do not let your foot drift forward. ? Do not let your knee roll toward the ceiling. 4. Hold this position for __________ seconds. 5. Slowly return to the starting position. 6. Let your muscles relax completely between repetitions. Repeat __________ times. Complete this exercise __________ times a day. Straight leg raises, side-lying This exercise strengthens the muscles that move the hip joint toward the center of the  body (hip adductors). 1. Lie on your side with your left / right leg in the bottom position. Lie so your head, shoulder, hip, and knee line up. You may place your upper foot in front to help you balance. 2. Roll your hips slightly forward, so your hips are stacked directly over each other and your left / right knee is facing forward. 3. Tense the muscles in your inner thigh and lift your bottom leg 4-6 inches (10-15 cm). 4. Hold this position for __________ seconds. 5. Slowly return to the starting position. 6. Let your muscles relax completely between repetitions. Repeat __________ times. Complete this exercise __________ times a day. Straight leg raises, supine This exercise strengthens the muscles in the front of your thigh (quadriceps). 1. Lie on your back (supine position) with your left / right leg extended and your other knee bent. 2. Tense the muscles in the front of your left / right thigh. You should see your kneecap slide up or see increased dimpling just above your knee. 3. Keep these muscles tight as you raise your   leg 4-6 inches (10-15 cm) off the floor. Do not let your knee bend. 4. Hold this position for __________ seconds. 5. Keep these muscles tense as you lower your leg. 6. Relax the muscles slowly and completely between repetitions. Repeat __________ times. Complete this exercise __________ times a day. Hip abductors, standing This exercise strengthens the muscles that move the leg and hip joint away from the center of the body (hip abductors). 1. Tie one end of a rubber exercise band or tubing to a secure surface, such as a chair, table, or pole. 2. Loop the other end of the band or tubing around your left / right ankle. 3. Keeping your ankle with the band or tubing directly opposite the secured end, step away until there is tension in the tubing or band. Hold on to a chair, table, or pole as needed for balance. 4. Lift your left / right leg out to your side. While you do  this: ? Keep your back upright. ? Keep your shoulders over your hips. ? Keep your toes pointing forward. ? Make sure to use your hip muscles to slowly lift your leg. Do not tip your body or forcefully lift your leg. 5. Hold this position for __________ seconds. 6. Slowly return to the starting position. Repeat __________ times. Complete this exercise __________ times a day. Squats This exercise strengthens the muscles in the front of your thigh (quadriceps). 1. Stand in a door frame so your feet and knees are in line with the frame. You may place your hands on the frame for balance. 2. Slowly bend your knees and lower your hips like you are going to sit in a chair. ? Keep your lower legs in a straight-up-and-down position. ? Do not let your hips go lower than your knees. ? Do not bend your knees lower than told by your health care provider. ? If your hip pain increases, do not bend as low. 3. Hold this position for ___________ seconds. 4. Slowly push with your legs to return to standing. Do not use your hands to pull yourself to standing. Repeat __________ times. Complete this exercise __________ times a day. This information is not intended to replace advice given to you by your health care provider. Make sure you discuss any questions you have with your health care provider. Document Revised: 07/08/2019 Document Reviewed: 10/13/2018 Elsevier Patient Education  2020 [employer]  

## 2020-12-13 ENCOUNTER — Other Ambulatory Visit (INDEPENDENT_AMBULATORY_CARE_PROVIDER_SITE_OTHER): Payer: Medicare HMO

## 2020-12-13 ENCOUNTER — Other Ambulatory Visit: Payer: Self-pay

## 2020-12-13 DIAGNOSIS — M7541 Impingement syndrome of right shoulder: Secondary | ICD-10-CM | POA: Diagnosis not present

## 2020-12-13 DIAGNOSIS — Z01818 Encounter for other preprocedural examination: Secondary | ICD-10-CM

## 2020-12-13 DIAGNOSIS — M25511 Pain in right shoulder: Secondary | ICD-10-CM | POA: Diagnosis not present

## 2020-12-13 LAB — PROTIME-INR
INR: 1.1 ratio — ABNORMAL HIGH (ref 0.8–1.0)
Prothrombin Time: 12 s (ref 9.6–13.1)

## 2020-12-20 ENCOUNTER — Encounter: Payer: Self-pay | Admitting: Primary Care

## 2020-12-20 ENCOUNTER — Ambulatory Visit: Payer: Medicare HMO | Admitting: Primary Care

## 2020-12-20 ENCOUNTER — Other Ambulatory Visit: Payer: Self-pay

## 2020-12-20 DIAGNOSIS — G4733 Obstructive sleep apnea (adult) (pediatric): Secondary | ICD-10-CM

## 2020-12-20 DIAGNOSIS — J4540 Moderate persistent asthma, uncomplicated: Secondary | ICD-10-CM

## 2020-12-20 DIAGNOSIS — Z9989 Dependence on other enabling machines and devices: Secondary | ICD-10-CM | POA: Diagnosis not present

## 2020-12-20 NOTE — Assessment & Plan Note (Signed)
-   Clinically stable; She has no acute symptoms or recent exacerbations. Rarely requires SABA - She had some mild atelectasis to right lung base on recent shoulder xray. Recommend patient use IS hourly while awake prior to and after hip surgery.  - Continue Advair 1 puff BID; albuterol hfa 2 puffs every 6 hours for sob/wheezing

## 2020-12-20 NOTE — Patient Instructions (Signed)
Atelectasis is partial collapse of lung/airsacs can be deflated or stuck together. This can be caused from asthma, prior trauma, recent surgeries or immobility. Use IS every day prior to and after hip surgery  Recommendations: Use Incentive spirometer - take 5-10 breath every hour May consider repeat CXR imaging prior to hip surgery in February  Follow-up: 1 year with Dr. Belia Heman or sooner if needed

## 2020-12-20 NOTE — Progress Notes (Signed)
@Patient  ID: Molly Faulkner, female    DOB: [birth year], 80 y.o.   MRN: [id number]  Chief Complaint  Patient presents with  . Follow-up    Wearing cpap avg 4hr nightly- feels pressure and mask is okay. C/o sob with exertion.     Referring provider: Jearld Fenton, NP  HPI: 80 year old female, former smoker (3.5-pack-year history).  History significant for HTN, chronic seasonal allergies, hyperlipidemia, asthma and OSA.  Patient of Dr. Mortimer Fries, last seen on 09/21/20.   12/20/2020- Interim hx Presents today for regular 1 year follow-up asthma/OSA. She had shoulder xray recently which showed atelectasis at right lung base. States that she is out of shape physically and not exercising d/t hip. States that if she walks too fast or too far she gets alittle winded. She uses Advair once daily and has not needed to use her rescue inhaler. Her hip surgery has been pushed back to February 9th with [facility] in [place]. She is compliant with CPAP use. Denies significant cough, chest discomfort, wheezing.   Airview download 11/19/20-12/18/20 Usage 30/30 days used; 87% > 4 hours Pressure 10cm h20 AHI 0.9   Allergies  Allergen Reactions  . Avelox [Moxifloxacin Hcl In Nacl] Other (See Comments)    Chills  . Trazodone And Nefazodone Swelling  . Clarithromycin Other (See Comments)    Unsure of reaction type    Immunization History  Administered Date(s) Administered  . Fluad Quad(high Dose 65+) 09/14/2019, 09/06/2020  . Influenza,inj,Quad PF,6+ Mos 09/06/2014, 08/23/2015, 09/09/2016, 09/22/2017, 09/23/2018  . Influenza-Unspecified 08/16/2013  . PFIZER SARS-COV-2 Vaccination 01/20/2020, 02/10/2020, 09/14/2020  . Pneumococcal Conjugate-13 02/14/2016  . Pneumococcal Polysaccharide-23 01/17/2012  . Tdap 12/17/2011  . Zoster 12/17/2011    Past Medical History:  Diagnosis Date  . Allergy   . Arthritis   . Asthma   . Carpal tunnel syndrome 12/2017   left, referred to neurology for EMG  testing  . 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/2017  . Chicken pox   . Depression   . Diverticulitis   . Diverticulosis   . Dysrhythmia    paroxysmal tachycardia  . GERD (gastroesophageal reflux disease)   . Hx of colonic polyps   . Hyperlipidemia   . Hypertension   . Insomnia   . 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 Sleep apnea    H/O   NO LONGER  . Syncope     Tobacco History: Social History   Tobacco Use  Smoking Status Former Smoker  . Packs/day: 0.50  . Years: 7.00  . Pack years: 3.50  Smokeless Tobacco Never Used  Tobacco Comment   quit over 40 years ago   Counseling given: Not Answered Comment: quit over 40 years ago   Outpatient Medications Prior to Visit  Medication Sig Dispense Refill  . acetaminophen (TYLENOL) 500 MG tablet Take 500 mg by mouth every 8 (eight) hours as needed for moderate pain.    Marland Kitchen albuterol (VENTOLIN HFA) 108 (90 Base) MCG/ACT inhaler INHALE 2 PUFFS INTO THE LUNGS EVERY 6 HOURS AS NEEDED FOR WHEEZING OR SHORTNESS OF BREATH (Patient taking differently: Inhale 2 puffs into the lungs every 6 (six) hours as needed for shortness of breath or wheezing.) 18 g 1  . atenolol (TENORMIN) 25 MG tablet Take 1 tablet (25 mg total) by mouth 2 (two) times daily. Take 1 tablet (25 mg) by mouth twice a day 180 tablet 1  . cholecalciferol (VITAMIN D) 1000 units tablet Take 1,000 Units by mouth every other day.  At bedtime.    Marland Kitchen CREAM BASE EX Apply 1-2 application topically 4 (four) times daily as needed (for arthritis pain.). Penetrex Inflammation Formula (Arnica/Pyridoxine/MSM/Boswellia/Cetyl Myristoleate)    . fluticasone-salmeterol (ADVAIR HFA) 115-21 MCG/ACT inhaler Inhale 2 puffs into the lungs 2 (two) times daily. Inhale 1 puff into the lungs twice daily as needed (Patient taking differently: Inhale 1 puff into the lungs daily.) 1 Inhaler 5  . hydrochlorothiazide (HYDRODIURIL) 25 MG tablet Take 12.5 mg by mouth 2 (two) times daily. Take 1/2 tablet (12.5 mg) by mouth twice a day     . HYDROcodone-acetaminophen (NORCO/VICODIN) 5-325 MG tablet Take 1 tablet by mouth daily as needed for moderate pain. (Patient taking differently: Take 0.25 tablets by mouth daily as needed for moderate pain.) 15 tablet 0  . ibuprofen (ADVIL,MOTRIN) 200 MG tablet Take 200 mg by mouth at bedtime as needed for moderate pain (for arthritis pain.).    Marland Kitchen Lidocaine 4 % PTCH Apply 1 patch topically daily as needed (pain).    Marland Kitchen losartan (COZAAR) 50 MG tablet TAKE 1/2 TABLET IN THE MORNING  AND TAKE 1/2 TABLET IN THE EVENING (Patient taking differently: Take 25 mg by mouth in the morning and at bedtime. TAKE 1/2 TABLET IN THE MORNING  AND TAKE 1/2 TABLET IN THE EVENING) 90 tablet 1  . Menthol-Methyl Salicylate (PAIN RELIEVING) CREA Apply 1 application topically 4 (four) times daily as needed (for arthritis pain). EFAC PAIN RELIEVING CREAM    . Menthol-Methyl Salicylate (SALONPAS PAIN RELIEF PATCH EX) Apply 1 patch topically daily as needed (pain).    . mirtazapine (REMERON SOL-TAB) 15 MG disintegrating tablet DISSOLVE 1 TABLET  ON  TONGUE AT BEDTIME (Patient taking differently: Take 1 mg by mouth at bedtime as needed (insomnia).) 20 tablet 0  . Multiple Vitamins-Minerals (EYE SUPPORT PO) Take 1 tablet by mouth every other day.    Marland Kitchen OVER THE COUNTER MEDICATION Take 1 tablet by mouth 3 (three) times daily with meals. Cholester Care     No facility-administered medications prior to visit.    Review of Systems  Review of Systems  Constitutional: Negative.  Negative for fatigue.  Respiratory: Negative for cough, chest tightness, shortness of breath and wheezing.   Cardiovascular: Negative.   Musculoskeletal:       Right shoulder and left hip pain   Psychiatric/Behavioral: Negative.     Physical Exam  BP 124/78 (BP Location: Left Arm, Cuff Size: Normal)   Pulse (!) 58   Temp (!) 97.5 F (36.4 C) (Temporal)   Ht 5\' 1"  (1.549 m)   Wt 180 lb 3.2 oz (81.7 kg)   SpO2 100%   BMI 34.05 kg/m  Physical  Exam Constitutional:      Appearance: Normal appearance.  HENT:     Head: Normocephalic and atraumatic.     Mouth/Throat:     Mouth: Mucous membranes are moist.     Pharynx: Oropharynx is clear.  Cardiovascular:     Rate and Rhythm: Normal rate and regular rhythm.  Pulmonary:     Effort: Pulmonary effort is normal.     Breath sounds: Normal breath sounds. No wheezing, rhonchi or rales.  Skin:    General: Skin is warm and dry.  Neurological:     General: No focal deficit present.     Mental Status: She is alert and oriented to person, place, and time. Mental status is at baseline.  Psychiatric:        Mood and Affect: Mood normal.  Behavior: Behavior normal.        Thought Content: Thought content normal.        Judgment: Judgment normal.      Lab Results:  CBC    Component Value Date/Time   WBC 6.0 12/05/2020 1439   RBC 4.13 12/05/2020 1439   HGB 13.5 12/05/2020 1439   HGB 13.9 01/10/2020 0950   HCT 38.4 12/05/2020 1439   HCT 40.1 01/10/2020 0950   PLT 202.0 12/05/2020 1439   PLT 227 01/10/2020 0950   MCV 93.1 12/05/2020 1439   MCV 97 01/10/2020 0950   MCH 33.6 (H) 01/10/2020 0950   MCH 33.3 (H) 05/29/2018 1547   MCHC 35.2 12/05/2020 1439   RDW 12.6 12/05/2020 1439   RDW 12.3 01/10/2020 0950   LYMPHSABS 1.0 01/10/2020 0950   EOSABS 0.3 01/10/2020 0950   BASOSABS 0.0 01/10/2020 0950    BMET    Component Value Date/Time   NA 139 12/05/2020 1439   NA 140 01/10/2020 0950   K 3.7 12/05/2020 1439   CL 104 12/05/2020 1439   CO2 30 12/05/2020 1439   GLUCOSE 86 12/05/2020 1439   BUN 15 12/05/2020 1439   BUN 15 01/10/2020 0950   CREATININE 0.91 12/05/2020 1439   CREATININE 0.99 (H) 05/29/2018 1547   CALCIUM 10.2 12/05/2020 1439   GFRNONAA 64 01/10/2020 0950   GFRAA 74 01/10/2020 0950    BNP    Component Value Date/Time   BNP 19.0 04/11/2017 1352    ProBNP No results found for: PROBNP  Imaging: No results found.   Assessment & Plan:   OSA  on CPAP - Patient is 100% compliant with CPAP and reports benefit from use - Pressure 10cm h20; residual AHI 0.9 - Well controlled on current therapy, no changes today - Follow up in 1 year with Dr. Mortimer Fries or sooner if needed   Asthma, moderate persistent, well-controlled - Clinically stable; She has no acute symptoms or recent exacerbations. Rarely requires SABA - She had some mild atelectasis to right lung base on recent shoulder xray. Recommend patient use IS hourly while awake prior to and after hip surgery.  - Continue Advair 1 puff BID; albuterol hfa 2 puffs every 6 hours for sob/wheezing   Martyn Ehrich, NP 12/20/2020

## 2020-12-20 NOTE — Assessment & Plan Note (Signed)
-   Patient is 100% compliant with CPAP and reports benefit from use - Pressure 10cm h20; residual AHI 0.9 - Well controlled on current therapy, no changes today - Follow up in 1 year with Dr. Belia Heman or sooner if needed

## 2020-12-21 ENCOUNTER — Other Ambulatory Visit: Payer: Medicare HMO

## 2020-12-25 ENCOUNTER — Ambulatory Visit: Payer: Medicare HMO | Admitting: Cardiovascular Disease

## 2020-12-27 DIAGNOSIS — H16223 Keratoconjunctivitis sicca, not specified as Sjogren's, bilateral: Secondary | ICD-10-CM | POA: Diagnosis not present

## 2020-12-28 ENCOUNTER — Other Ambulatory Visit: Payer: Medicare HMO

## 2021-01-01 ENCOUNTER — Ambulatory Visit: Admit: 2021-01-01 | Payer: Medicare HMO | Admitting: Orthopedic Surgery

## 2021-01-01 SURGERY — ARTHROPLASTY, HIP, TOTAL, ANTERIOR APPROACH
Anesthesia: Spinal | Site: Hip | Laterality: Left

## 2021-01-15 ENCOUNTER — Telehealth: Payer: Self-pay | Admitting: Internal Medicine

## 2021-01-15 NOTE — Telephone Encounter (Signed)
Patient was cleared for hip surgery on 12/20/2020. Last OV note has been faxed to [facility].  Received successful fax confirmation.  Ria Comment with [facility] is aware and voiced her understanding.  Nothing further needed.

## 2021-01-16 DIAGNOSIS — M25551 Pain in right hip: Secondary | ICD-10-CM | POA: Diagnosis not present

## 2021-01-19 DIAGNOSIS — M1612 Unilateral primary osteoarthritis, left hip: Secondary | ICD-10-CM | POA: Diagnosis not present

## 2021-01-19 DIAGNOSIS — Z01812 Encounter for preprocedural laboratory examination: Secondary | ICD-10-CM | POA: Diagnosis not present

## 2021-01-20 NOTE — Progress Notes (Addendum)
Cardiology Office Note  Date:  01/22/2021   ID:  Molly Faulkner, Molly Faulkner [birth year], MRN [id number]  PCP:  Jearld Fenton, NP   Chief Complaint  Patient presents with  . Follow-up    Annual follow up. Medications verbally reviewed with patient.   . Pre-op Exam    Patient need surgical clearance for total hip replacement on 01/24/2021.    HPI:  Molly Faulkner is a very pleasant 80 year old woman with history of  hypertension,  syncope,  tachycardia  OSA on CPAP Cardiac workup in 2011 at [place], echo and stress test who presents for follow-up of her hypertension and tachycardia  Last seen by myself  12/2018 Seen by one of our provider 12/2019  Getting ready for hip surgery, on left This Friday Dr. 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  PSH:    Past Surgical History:  Procedure Laterality Date  . ABDOMINAL HYSTERECTOMY  1994   total  . BREAST BIOPSY Bilateral [id number]  . BREAST EXCISIONAL BIOPSY    . CARPAL TUNNEL RELEASE Bilateral   . CATARACT EXTRACTION W/PHACO Right 12/02/2017   Procedure: CATARACT EXTRACTION PHACO AND INTRAOCULAR LENS PLACEMENT (IOC);  Surgeon: Birder Robson, MD;  Location: [facility] ORS;  Service: Ophthalmology;  Laterality: Right;  [place] 00:36 AP% 11.5 CDE 4.21 Fluid pack lot # [id number]  . CATARACT EXTRACTION W/PHACO Left 12/30/2017   Procedure: CATARACT EXTRACTION PHACO AND  INTRAOCULAR LENS PLACEMENT ([place]);  Surgeon: Birder Robson, MD;  Location: [facility] ORS;  Service: Ophthalmology;  Laterality: Left;  [place] 00:58 AP% 11.6 CDE 6.80 Fluid pack lot # [id number]  . COLONOSCOPY WITH PROPOFOL N/A 06/30/2018   Procedure: COLONOSCOPY WITH PROPOFOL;  Surgeon: Jonathon Bellows, MD;  Location: [facility] ENDOSCOPY;  Service: Gastroenterology;  Laterality: N/A;  . DILATION AND CURETTAGE OF UTERUS     X 2  . KNEE ARTHROSCOPY      Current Outpatient Medications  Medication Sig Dispense Refill  . acetaminophen (TYLENOL) 500 MG tablet Take 500 mg by mouth every 8 (eight) hours as needed for moderate pain.    Marland Kitchen atenolol (TENORMIN) 25 MG tablet Take 1 tablet (25 mg total) by mouth 2 (two) times daily. Take 1 tablet (25 mg) by mouth twice a day 180 tablet 1  . cholecalciferol (VITAMIN D) 1000 units tablet Take 1,000 Units by mouth every other day. At bedtime.    Marland Kitchen CREAM BASE EX Apply 1-2 application topically 4 (four) times daily as needed (for arthritis pain.). Penetrex Inflammation Formula (Arnica/Pyridoxine/MSM/Boswellia/Cetyl Myristoleate)    . fluticasone-salmeterol (ADVAIR HFA) 115-21 MCG/ACT inhaler Inhale 2 puffs into the lungs 2 (two) times daily. Inhale 1 puff into the lungs twice daily as needed (Patient taking differently: Inhale 1 puff into the lungs daily.) 1 Inhaler 5  . hydrochlorothiazide (HYDRODIURIL) 25 MG tablet Take 12.5 mg by mouth 2 (two) times daily. Take 1/2 tablet (12.5 mg) by mouth twice a day    . HYDROcodone-acetaminophen (NORCO/VICODIN) 5-325 MG tablet  Take 1 tablet by mouth daily as needed for moderate pain. (Patient taking differently: Take 0.25 tablets by mouth daily as needed for moderate pain.) 15 tablet 0  . ibuprofen (ADVIL,MOTRIN) 200 MG tablet Take 200 mg by mouth at bedtime as needed for moderate pain (for arthritis pain.).    Marland Kitchen Lidocaine 4 % PTCH Apply 1 patch topically daily as needed (pain).    Marland Kitchen losartan (COZAAR) 50 MG tablet TAKE 1/2 TABLET IN THE  MORNING  AND TAKE 1/2 TABLET IN THE EVENING (Patient taking differently: Take 25 mg by mouth in the morning and at bedtime. TAKE 1/2 TABLET IN THE MORNING  AND TAKE 1/2 TABLET IN THE EVENING) 90 tablet 1  . Menthol-Methyl Salicylate (PAIN RELIEVING) CREA Apply 1 application topically 4 (four) times daily as needed (for arthritis pain). EFAC PAIN RELIEVING CREAM    . Menthol-Methyl Salicylate (SALONPAS PAIN RELIEF PATCH EX) Apply 1 patch topically daily as needed (pain).    . mirtazapine (REMERON SOL-TAB) 15 MG disintegrating tablet DISSOLVE 1 TABLET  ON  TONGUE AT BEDTIME (Patient taking differently: Take 1 mg by mouth at bedtime as needed (insomnia).) 20 tablet 0  . OVER THE COUNTER MEDICATION Take 1 tablet by mouth 3 (three) times daily with meals. Cholester Care     No current facility-administered medications for this visit.     Allergies:   Avelox [moxifloxacin hcl in nacl], Trazodone and nefazodone, and Clarithromycin   Social History:  The patient  reports that she has quit smoking. She has a 3.50 pack-year smoking history. She has never used smokeless tobacco. She reports current alcohol use. She reports that she does not use drugs.   Family History:   family history includes Arthritis in her maternal grandmother; Breast cancer (age of onset: 40) in her mother; Diabetes in her maternal grandmother.    Review of Systems: Review of Systems  Constitutional: Negative.   Respiratory: Negative.   Cardiovascular: Negative.   Gastrointestinal: Negative.   Musculoskeletal: Negative.   Neurological: Negative.   Psychiatric/Behavioral: Negative.   All other systems reviewed and are negative.   PHYSICAL EXAM: VS:  BP 134/76 (BP Location: Left Arm, Patient Position: Sitting, Cuff Size: Normal)   Pulse 75   Ht 5\' 1"  (1.549 m)   Wt 181 lb (82.1 kg)   SpO2 97%   BMI 34.20 kg/m  , BMI Body mass index is 34.2 kg/m. Constitutional:  oriented to person, place, and time. No distress.  HENT:   Head: Grossly normal Eyes:  no discharge. No scleral icterus.  Neck: No JVD, no carotid bruits  Cardiovascular: Regular rate and rhythm, no murmurs appreciated Pulmonary/Chest: Clear to auscultation bilaterally, no wheezes or rails Abdominal: Soft.  no distension.  no tenderness.  Musculoskeletal: Normal range of motion Neurological:  normal muscle tone. Coordination normal. No atrophy Skin: Skin warm and dry Psychiatric: normal affect, pleasant   Recent Labs: 10/26/2020: TSH 1.13 12/05/2020: ALT 12; BUN 15; Creatinine, Ser 0.91; Hemoglobin 13.5; Platelets 202.0; Potassium 3.7; Sodium 139    Lipid Panel Lab Results  Component Value Date   CHOL 188 03/22/2020   HDL 59 03/22/2020   LDLCALC 120 (H) 03/22/2020   TRIG 44 03/22/2020      Wt Readings from Last 3 Encounters:  01/22/21 181 lb (82.1 kg)  12/20/20 180 lb 3.2 oz (81.7 kg)  12/05/20 182 lb 12.8 oz (82.9 kg)      ASSESSMENT AND PLAN:  preop cardiovascular eval Acceptable risk for surgery No  further testing needed Will CC Dr. Harlow Mares  Essential hypertension - Plan: EKG 12-Lead Blood pressure is well controlled on today's visit. No changes made to the medications.  Pure hypercholesterolemia Currently not on any cholesterol medication. Ordered CT coronary calcium score for risk stratification Further decisions based on the results This can be done following hip surgery and recovery  OSA  On CPAP, stable    Total encounter time more than 25 minutes  Greater than 50% was spent in counseling and coordination of care with the patient    Orders Placed This Encounter  Procedures  . CT CARDIAC SCORING (SELF PAY ONLY)  . EKG 12-Lead     Signed, Esmond Plants, M.D., Ph.D. 01/22/2021  [place], [place]

## 2021-01-22 ENCOUNTER — Other Ambulatory Visit: Payer: Self-pay

## 2021-01-22 ENCOUNTER — Encounter: Payer: Self-pay | Admitting: Cardiovascular Disease

## 2021-01-22 ENCOUNTER — Ambulatory Visit: Payer: Medicare HMO | Admitting: Cardiovascular Disease

## 2021-01-22 VITALS — BP 134/76 | HR 75 | Ht 61.0 in | Wt 181.0 lb

## 2021-01-22 DIAGNOSIS — Z9989 Dependence on other enabling machines and devices: Secondary | ICD-10-CM | POA: Diagnosis not present

## 2021-01-22 DIAGNOSIS — E782 Mixed hyperlipidemia: Secondary | ICD-10-CM

## 2021-01-22 DIAGNOSIS — I10 Essential (primary) hypertension: Secondary | ICD-10-CM

## 2021-01-22 DIAGNOSIS — G4733 Obstructive sleep apnea (adult) (pediatric): Secondary | ICD-10-CM

## 2021-01-22 NOTE — Patient Instructions (Addendum)
Medication Instructions:  Please continue your current medications, there are no changes at this. If you need any refills on your cardiac medications, please reach out to your pharmacy, they will send in request.   Lab work: No new labs needed  Testing/Procedures: CT coronary calcium score  For high cholesterol, risk stratification  $99 out of pocket expense at our [facility] in [place]  This procedure uses special x-ray equipment to produce pictures of the coronary arteries to determine if they are blocked or narrowed by the buildup of plaque - an indicator for atherosclerosis or coronary artery disease (CAD).  Please call [phone] to schedule at your earliest convince   [facility] [address] [place], [place] [address]   Follow-Up:  . You will need a follow up appointment in 12 months  . Providers on your designated Care Team:   . Murray Hodgkins, NP . Christell Faith, PA-C . Marrianne Mood, PA-C

## 2021-01-23 ENCOUNTER — Telehealth: Payer: Self-pay | Admitting: Internal Medicine

## 2021-01-23 NOTE — Telephone Encounter (Signed)
I have already spoken to Ria Comment this morning around 8:30am and faxed over clearance form and notes... I spoke to [place] and she reported that she has already received the forms needed

## 2021-01-23 NOTE — Telephone Encounter (Signed)
lidnsey called in wanted to know about getting the surgical clearance for Molly Faulkner for surgery tomorrow. She can be reached at [facility] Fax number [phone]

## 2021-01-24 DIAGNOSIS — I10 Essential (primary) hypertension: Secondary | ICD-10-CM | POA: Diagnosis not present

## 2021-01-24 DIAGNOSIS — J45909 Unspecified asthma, uncomplicated: Secondary | ICD-10-CM | POA: Diagnosis not present

## 2021-01-24 DIAGNOSIS — Z885 Allergy status to narcotic agent status: Secondary | ICD-10-CM | POA: Diagnosis not present

## 2021-01-24 DIAGNOSIS — Z881 Allergy status to other antibiotic agents status: Secondary | ICD-10-CM | POA: Diagnosis not present

## 2021-01-24 DIAGNOSIS — G4733 Obstructive sleep apnea (adult) (pediatric): Secondary | ICD-10-CM | POA: Diagnosis not present

## 2021-01-24 DIAGNOSIS — E785 Hyperlipidemia, unspecified: Secondary | ICD-10-CM | POA: Diagnosis not present

## 2021-01-24 DIAGNOSIS — Z87891 Personal history of nicotine dependence: Secondary | ICD-10-CM | POA: Diagnosis not present

## 2021-01-24 DIAGNOSIS — M1612 Unilateral primary osteoarthritis, left hip: Secondary | ICD-10-CM | POA: Diagnosis not present

## 2021-01-24 DIAGNOSIS — F32A Depression, unspecified: Secondary | ICD-10-CM | POA: Diagnosis not present

## 2021-01-25 DIAGNOSIS — I10 Essential (primary) hypertension: Secondary | ICD-10-CM | POA: Diagnosis not present

## 2021-01-25 DIAGNOSIS — Z881 Allergy status to other antibiotic agents status: Secondary | ICD-10-CM | POA: Diagnosis not present

## 2021-01-25 DIAGNOSIS — M1612 Unilateral primary osteoarthritis, left hip: Secondary | ICD-10-CM | POA: Diagnosis not present

## 2021-01-25 DIAGNOSIS — Z885 Allergy status to narcotic agent status: Secondary | ICD-10-CM | POA: Diagnosis not present

## 2021-01-25 DIAGNOSIS — G4733 Obstructive sleep apnea (adult) (pediatric): Secondary | ICD-10-CM | POA: Diagnosis not present

## 2021-01-25 DIAGNOSIS — J45909 Unspecified asthma, uncomplicated: Secondary | ICD-10-CM | POA: Diagnosis not present

## 2021-01-25 DIAGNOSIS — F32A Depression, unspecified: Secondary | ICD-10-CM | POA: Diagnosis not present

## 2021-01-25 DIAGNOSIS — E785 Hyperlipidemia, unspecified: Secondary | ICD-10-CM | POA: Diagnosis not present

## 2021-01-25 DIAGNOSIS — Z87891 Personal history of nicotine dependence: Secondary | ICD-10-CM | POA: Diagnosis not present

## 2021-01-29 DIAGNOSIS — Z96642 Presence of left artificial hip joint: Secondary | ICD-10-CM | POA: Diagnosis not present

## 2021-01-29 DIAGNOSIS — M25552 Pain in left hip: Secondary | ICD-10-CM | POA: Diagnosis not present

## 2021-01-30 ENCOUNTER — Other Ambulatory Visit: Payer: Self-pay | Admitting: Internal Medicine

## 2021-02-05 DIAGNOSIS — Z96649 Presence of unspecified artificial hip joint: Secondary | ICD-10-CM | POA: Insufficient documentation

## 2021-02-05 DIAGNOSIS — Z96642 Presence of left artificial hip joint: Secondary | ICD-10-CM | POA: Diagnosis not present

## 2021-02-05 DIAGNOSIS — M25552 Pain in left hip: Secondary | ICD-10-CM | POA: Diagnosis not present

## 2021-02-12 DIAGNOSIS — M25552 Pain in left hip: Secondary | ICD-10-CM | POA: Diagnosis not present

## 2021-02-12 DIAGNOSIS — Z96642 Presence of left artificial hip joint: Secondary | ICD-10-CM | POA: Diagnosis not present

## 2021-02-15 DIAGNOSIS — Z96642 Presence of left artificial hip joint: Secondary | ICD-10-CM | POA: Diagnosis not present

## 2021-02-15 DIAGNOSIS — M25552 Pain in left hip: Secondary | ICD-10-CM | POA: Diagnosis not present

## 2021-02-22 DIAGNOSIS — Z96642 Presence of left artificial hip joint: Secondary | ICD-10-CM | POA: Diagnosis not present

## 2021-02-22 DIAGNOSIS — M25552 Pain in left hip: Secondary | ICD-10-CM | POA: Diagnosis not present

## 2021-02-26 DIAGNOSIS — M25552 Pain in left hip: Secondary | ICD-10-CM | POA: Diagnosis not present

## 2021-02-26 DIAGNOSIS — Z96642 Presence of left artificial hip joint: Secondary | ICD-10-CM | POA: Diagnosis not present

## 2021-03-01 DIAGNOSIS — Z96642 Presence of left artificial hip joint: Secondary | ICD-10-CM | POA: Diagnosis not present

## 2021-03-01 DIAGNOSIS — M25552 Pain in left hip: Secondary | ICD-10-CM | POA: Diagnosis not present

## 2021-03-06 DIAGNOSIS — Z96642 Presence of left artificial hip joint: Secondary | ICD-10-CM | POA: Diagnosis not present

## 2021-03-13 DIAGNOSIS — G4733 Obstructive sleep apnea (adult) (pediatric): Secondary | ICD-10-CM | POA: Diagnosis not present

## 2021-03-14 DIAGNOSIS — M25552 Pain in left hip: Secondary | ICD-10-CM | POA: Diagnosis not present

## 2021-03-14 DIAGNOSIS — Z96642 Presence of left artificial hip joint: Secondary | ICD-10-CM | POA: Diagnosis not present

## 2021-03-19 ENCOUNTER — Other Ambulatory Visit: Payer: Self-pay | Admitting: Internal Medicine

## 2021-03-20 DIAGNOSIS — Z96642 Presence of left artificial hip joint: Secondary | ICD-10-CM | POA: Diagnosis not present

## 2021-04-02 ENCOUNTER — Ambulatory Visit: Payer: Medicare HMO | Admitting: Dermatology

## 2021-04-09 ENCOUNTER — Ambulatory Visit: Payer: Medicare HMO

## 2021-04-13 ENCOUNTER — Other Ambulatory Visit: Payer: Self-pay | Admitting: Cardiovascular Disease

## 2021-04-13 NOTE — Telephone Encounter (Signed)
Rx request sent to pharmacy.  

## 2021-04-16 ENCOUNTER — Other Ambulatory Visit: Payer: Self-pay

## 2021-04-16 ENCOUNTER — Encounter: Payer: Self-pay | Admitting: Internal Medicine

## 2021-04-16 ENCOUNTER — Ambulatory Visit (INDEPENDENT_AMBULATORY_CARE_PROVIDER_SITE_OTHER): Payer: Medicare HMO | Admitting: Internal Medicine

## 2021-04-16 VITALS — BP 115/59 | HR 59 | Temp 97.5°F | Wt 178.0 lb

## 2021-04-16 DIAGNOSIS — G8929 Other chronic pain: Secondary | ICD-10-CM | POA: Diagnosis not present

## 2021-04-16 DIAGNOSIS — E6609 Other obesity due to excess calories: Secondary | ICD-10-CM

## 2021-04-16 DIAGNOSIS — M25511 Pain in right shoulder: Secondary | ICD-10-CM

## 2021-04-16 DIAGNOSIS — Z6833 Body mass index (BMI) 33.0-33.9, adult: Secondary | ICD-10-CM | POA: Diagnosis not present

## 2021-04-16 DIAGNOSIS — F5101 Primary insomnia: Secondary | ICD-10-CM | POA: Diagnosis not present

## 2021-04-16 DIAGNOSIS — M479 Spondylosis, unspecified: Secondary | ICD-10-CM | POA: Diagnosis not present

## 2021-04-16 MED ORDER — TRAZODONE HCL 50 MG PO TABS: 25.0000 mg | ORAL_TABLET | Freq: Every evening | ORAL | 0 refills | Status: DC | PRN

## 2021-04-16 NOTE — Assessment & Plan Note (Signed)
She likely has a rotator cuff tear Discussed MRI but she would like see orthopedics first Continue Ibuprofen, Glucosamine Condroitin, Cyclobenzaprine and Hydrocodone as prescribed Shoulder exercises given Referral to orthopedics placed

## 2021-04-16 NOTE — Progress Notes (Signed)
Subjective:    Patient ID: Molly Faulkner, female    DOB: 09-18-1941, 80 y.o.   MRN: [id number]  HPI  Pt presents to the clinic today for follow up OA, mainly in her lower back, left hip and right shoulder. This has been a chronic issue for her. She recently had a left total hip replacement by Dr. Harlow Mares. She has already finished PT for this but is still doing the exercises they gave. She reports her back pain improved some after the hip replacement but has since returned to baseline. She describes the pain as sore and achy. The pain can be sharp with certain movements. She reports the pain can radiate into her left buttock but she denies loss of bowel or bladder control, numbness, tingling or weakness of her left lower extremity. She had a MRI lumbar spine 09/2019 which showed:  IMPRESSION: 1. Lumbar spondylosis and degenerative disc disease, causing potential prominent impingement at T11-12 (although not fully characterized) as well as moderate impingement at L3-4; mild to moderate impingement at L5-S1; and mild impingement at L1-2 and L2-3, as detailed above. 2. Thin lipoma of the filum terminale.  She was initially seeing Dr. Melrose Nakayama who referred her to Dr. Nelva Bush for an injection in the lumbar spine which she reports did not provide any long lasting relief. She is considering requesting a referral to PT for her back.  She describes the right shoulder pain as sore and achy but can be sharp when she tries to hold something and lift it straight out in front of her. She has had an episode of numbness and tingling in her right hand x 1 but feels like this may be more related to her carpal tunnel and not her shoulder pain. She denies any neck pain or headaches. Xray of the right shoulder from 10/2020 showed:  IMPRESSION: Narrowing acromioclavicular joint. No fracture or dislocation. No erosive change.  She has taken Ibuprofen, Glucosamine Chondroitin, Cyclobenzaprine and Hydrocodone as prescribed  with some relief of symptoms. She has failed Celebrex and Meloxicam in the past. She would like a referral to Dr. Mack Guise for further evaluation of her shoulder pain.  She also reports difficulty staying asleep. This has been a chronic issue for her. She intermittently takes Mirtazapine as needed with some relief of symptoms. She has failed Ambien in the past. Sleep study from 2017 reviewed, she does wear a CPAP.  Review of Systems      Past Medical History:  Diagnosis Date  . Allergy   . Arthritis   . Asthma   . Carpal tunnel syndrome 12/2017   left, referred to neurology for EMG testing  . 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/2017  . Chicken pox   . Depression   . Diverticulitis   . Diverticulosis   . Dysrhythmia    paroxysmal tachycardia  . GERD (gastroesophageal reflux disease)   . Hx of colonic polyps   . Hyperlipidemia   . Hypertension   . Insomnia   . 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 Sleep apnea    H/O   NO LONGER  . Syncope     Current Outpatient Medications  Medication Sig Dispense Refill  . acetaminophen (TYLENOL) 500 MG tablet Take 500 mg by mouth every 8 (eight) hours as needed for moderate pain.    Marland Kitchen ADVAIR HFA 115-21 MCG/ACT inhaler INHALE 2 PUFFS BY MOUTH INTO THE LUNGS TWICE DAILY AS NEEDED 12 g 2  . atenolol (TENORMIN) 25 MG tablet TAKE 1  TABLET TWICE DAILY 180 tablet 3  . cholecalciferol (VITAMIN D) 1000 units tablet Take 1,000 Units by mouth every other day. At bedtime.    Marland Kitchen CREAM BASE EX Apply 1-2 application topically 4 (four) times daily as needed (for arthritis pain.). Penetrex Inflammation Formula (Arnica/Pyridoxine/MSM/Boswellia/Cetyl Myristoleate)    . cyclobenzaprine (FLEXERIL) 5 MG tablet Take 5 mg by mouth at bedtime.    . fluticasone (FLONASE) 50 MCG/ACT nasal spray Place into both nostrils daily.    . Glucos-Chondroit-Hyaluron-MSM (GLUCOSAMINE CHONDROITIN JOINT PO) Take by mouth.    . hydrochlorothiazide (HYDRODIURIL) 25 MG tablet TAKE 1 TABLET EVERY DAY 90  tablet 0  . HYDROcodone-acetaminophen (NORCO/VICODIN) 5-325 MG tablet Take 1 tablet by mouth daily as needed for moderate pain. (Patient taking differently: Take 0.25 tablets by mouth daily as needed for moderate pain.) 15 tablet 0  . ibuprofen (ADVIL,MOTRIN) 200 MG tablet Take 200 mg by mouth at bedtime as needed for moderate pain (for arthritis pain.).    Marland Kitchen Lidocaine 4 % PTCH Apply 1 patch topically daily as needed (pain).    Marland Kitchen losartan (COZAAR) 50 MG tablet TAKE 1/2 TABLET IN THE MORNING  AND TAKE 1/2 TABLET IN THE EVENING (Patient taking differently: Take 25 mg by mouth in the morning and at bedtime. TAKE 1/2 TABLET IN THE MORNING  AND TAKE 1/2 TABLET IN THE EVENING) 90 tablet 1  . Menthol-Methyl Salicylate (PAIN RELIEVING) CREA Apply 1 application topically 4 (four) times daily as needed (for arthritis pain). EFAC PAIN RELIEVING CREAM    . Menthol-Methyl Salicylate (SALONPAS PAIN RELIEF PATCH EX) Apply 1 patch topically daily as needed (pain).    . mirtazapine (REMERON SOL-TAB) 15 MG disintegrating tablet DISSOLVE 1 TABLET  ON  TONGUE AT BEDTIME (Patient taking differently: Take 1 mg by mouth at bedtime as needed (insomnia).) 20 tablet 0  . OVER THE COUNTER MEDICATION Take 1 tablet by mouth 3 (three) times daily with meals. Cholester Care     No current facility-administered medications for this visit.    Allergies  Allergen Reactions  . Avelox [Moxifloxacin Hcl In Nacl] Other (See Comments)    Chills  . Trazodone And Nefazodone Swelling  . Clarithromycin Other (See Comments)    Unsure of reaction type    Family History  Problem Relation Age of Onset  . Breast cancer Mother 36  . Arthritis Maternal Grandmother   . Diabetes Maternal Grandmother     Social History   Socioeconomic History  . Marital status: Single    Spouse name: Not on file  . Number of children: Not on file  . Years of education: Not on file  . Highest education level: Not on file  Occupational History  .  Not on file  Tobacco Use  . Smoking status: Former Smoker    Packs/day: 0.50    Years: 7.00    Pack years: 3.50  . Smokeless tobacco: Never Used  . Tobacco comment: quit over 40 years ago  Vaping Use  . Vaping Use: Never used  Substance and Sexual Activity  . Alcohol use: Yes    Comment: rare--liquor  . Drug use: No  . Sexual activity: Not Currently  Other Topics Concern  . Not on file  Social History Narrative  . Not on file   Social Determinants of Health   Financial Resource Strain: Not on file  Food Insecurity: Not on file  Transportation Needs: Not on file  Physical Activity: Not on file  Stress: Not on file  Social Connections: Not on file  Intimate Partner Violence: Not on file     Constitutional: Denies fever, malaise, fatigue, headache or abrupt weight changes.  Respiratory: Denies difficulty breathing, shortness of breath, cough or sputum production.   Cardiovascular: Denies chest pain, chest tightness, palpitations or swelling in the hands or feet.  Musculoskeletal: Pt reports low back pain, right shoulder pain. Denies decrease in range of motion, difficulty with gait, muscle pain or joint swelling.  Skin: Denies redness, rashes, lesions or ulcercations.  Neurological: Pt reports insomnia. Denies persistent numbness or tingling in the upper/lower extremities or problems with balance and coordination.    No other specific complaints in a complete review of systems (except as listed in HPI above).  Objective:   Physical Exam   BP (!) 115/59 (BP Location: Left Arm, Patient Position: Sitting, Cuff Size: Large)   Pulse (!) 59   Temp (!) 97.5 F (36.4 C) (Temporal)   Wt 178 lb (80.7 kg)   SpO2 100%   BMI 33.63 kg/m  Wt Readings from Last 3 Encounters:  04/16/21 178 lb (80.7 kg)  01/22/21 181 lb (82.1 kg)  12/20/20 180 lb 3.2 oz (81.7 kg)    General: Appears her stated age, obese, in NAD. Skin: Warm, dry and intact. No rashesnoted. HEENT: Head: normal  shape and size; Eyes: sclera white and EOMs intact;  Cardiovascular: Bradycardic with normal rhythm. S1,S2 noted.  No murmur, rubs or gallops noted.  Pulmonary/Chest: Normal effort and positive vesicular breath sounds. No respiratory distress. No wheezes, rales or ronchi noted.  Musculoskeletal: Normal flexion, extension, rotation and lateral bending of the lumbar spine. No bony tenderness noted over the lumbar spine. Pain with palpation of the left SI joint and left paralumbar muscles. Decreased external rotation of the right shoulder. Normal internal rotation of the right shoulder. Positive drop can test on the right. Strength 5/5 BUE/BLE. Gait steady with assistance from a cane. Neurological: Alert and oriented.  Psych: Mood and affect normal. Behavior normal. Judgement and thought content normal.    BMET    Component Value Date/Time   NA 139 12/05/2020 1439   NA 140 01/10/2020 0950   K 3.7 12/05/2020 1439   CL 104 12/05/2020 1439   CO2 30 12/05/2020 1439   GLUCOSE 86 12/05/2020 1439   BUN 15 12/05/2020 1439   BUN 15 01/10/2020 0950   CREATININE 0.91 12/05/2020 1439   CREATININE 0.99 (H) 05/29/2018 1547   CALCIUM 10.2 12/05/2020 1439   GFRNONAA 64 01/10/2020 0950   GFRAA 74 01/10/2020 0950    Lipid Panel     Component Value Date/Time   CHOL 188 03/22/2020 1139   CHOL 203 (H) 01/10/2020 0950   TRIG 44 03/22/2020 1139   HDL 59 03/22/2020 1139   HDL 63 01/10/2020 0950   CHOLHDL 3.2 03/22/2020 1139   VLDL 9 03/22/2020 1139   LDLCALC 120 (H) 03/22/2020 1139   LDLCALC 129 (H) 01/10/2020 0950   LDLCALC 110 (H) 05/29/2018 1547    CBC    Component Value Date/Time   WBC 6.0 12/05/2020 1439   RBC 4.13 12/05/2020 1439   HGB 13.5 12/05/2020 1439   HGB 13.9 01/10/2020 0950   HCT 38.4 12/05/2020 1439   HCT 40.1 01/10/2020 0950   PLT 202.0 12/05/2020 1439   PLT 227 01/10/2020 0950   MCV 93.1 12/05/2020 1439   MCV 97 01/10/2020 0950   MCH 33.6 (H) 01/10/2020 0950   MCH 33.3  (H) 05/29/2018 1547  MCHC 35.2 12/05/2020 1439   RDW 12.6 12/05/2020 1439   RDW 12.3 01/10/2020 0950   LYMPHSABS 1.0 01/10/2020 0950   EOSABS 0.3 01/10/2020 0950   BASOSABS 0.0 01/10/2020 0950    Hgb A1C Lab Results  Component Value Date   HGBA1C 5.0 12/05/2020          Assessment & Plan:     Molly Silversmith, NP This visit occurred during the SARS-CoV-2 public health emergency.  Safety protocols were in place, including screening questions prior to the visit, additional usage of staff PPE, and extensive cleaning of exam room while observing appropriate contact time as indicated for disinfecting solutions.

## 2021-04-16 NOTE — Assessment & Plan Note (Signed)
Persistent, she likely needs surgery secondary to nerve impingement although she is not symptomatic at this time Continue Ibuprofen, Glucosamine Chondroitin, Cyclobenzaprine, Hydrocodone as prescribed Encouraged daily stretching She will let me know if she decides to try PT again

## 2021-04-16 NOTE — Patient Instructions (Signed)
Shoulder Exercises Ask your health care provider which exercises are safe for you. Do exercises exactly as told by your health care provider and adjust them as directed. It is normal to feel mild stretching, pulling, tightness, or discomfort as you do these exercises. Stop right away if you feel sudden pain or your pain gets worse. Do not begin these exercises until told by your health care provider. Stretching exercises External rotation and abduction This exercise is sometimes called corner stretch. This exercise rotates your arm outward (external rotation) and moves your arm out from your body (abduction). 1. Stand in a doorway with one of your feet slightly in front of the other. This is called a staggered stance. If you cannot reach your forearms to the door frame, stand facing a corner of a room. 2. Choose one of the following positions as told by your health care provider: ? Place your hands and forearms on the door frame above your head. ? Place your hands and forearms on the door frame at the height of your head. ? Place your hands on the door frame at the height of your elbows. 3. Slowly move your weight onto your front foot until you feel a stretch across your chest and in the front of your shoulders. Keep your head and chest upright and keep your abdominal muscles tight. 4. Hold for __________ seconds. 5. To release the stretch, shift your weight to your back foot. Repeat __________ times. Complete this exercise __________ times a day.   Extension, standing 1. Stand and hold a broomstick, a cane, or a similar object behind your back. ? Your hands should be a little wider than shoulder width apart. ? Your palms should face away from your back. 2. Keeping your elbows straight and your shoulder muscles relaxed, move the stick away from your body until you feel a stretch in your shoulders (extension). ? Avoid shrugging your shoulders while you move the stick. Keep your shoulder blades  tucked down toward the middle of your back. 3. Hold for __________ seconds. 4. Slowly return to the starting position. Repeat __________ times. Complete this exercise __________ times a day. Range-of-motion exercises Pendulum 1. Stand near a wall or a surface that you can hold onto for balance. 2. Bend at the waist and let your left / right arm hang straight down. Use your other arm to support you. Keep your back straight and do not lock your knees. 3. Relax your left / right arm and shoulder muscles, and move your hips and your trunk so your left / right arm swings freely. Your arm should swing because of the motion of your body, not because you are using your arm or shoulder muscles. 4. Keep moving your hips and trunk so your arm swings in the following directions, as told by your health care provider: ? Side to side. ? Forward and backward. ? In clockwise and counterclockwise circles. 5. Continue each motion for __________ seconds, or for as long as told by your health care provider. 6. Slowly return to the starting position. Repeat __________ times. Complete this exercise __________ times a day.   Shoulder flexion, standing 1. Stand and hold a broomstick, a cane, or a similar object. Place your hands a little more than shoulder width apart on the object. Your left / right hand should be palm up, and your other hand should be palm down. 2. Keep your elbow straight and your shoulder muscles relaxed. Push the stick up with your healthy   arm to raise your left / right arm in front of your body, and then over your head until you feel a stretch in your shoulder (flexion). ? Avoid shrugging your shoulder while you raise your arm. Keep your shoulder blade tucked down toward the middle of your back. 3. Hold for __________ seconds. 4. Slowly return to the starting position. Repeat __________ times. Complete this exercise __________ times a day.   Shoulder abduction, standing 1. Stand and hold a  broomstick, a cane, or a similar object. Place your hands a little more than shoulder width apart on the object. Your left / right hand should be palm up, and your other hand should be palm down. 2. Keep your elbow straight and your shoulder muscles relaxed. Push the object across your body toward your left / right side. Raise your left / right arm to the side of your body (abduction) until you feel a stretch in your shoulder. ? Do not raise your arm above shoulder height unless your health care provider tells you to do that. ? If directed, raise your arm over your head. ? Avoid shrugging your shoulder while you raise your arm. Keep your shoulder blade tucked down toward the middle of your back. 3. Hold for __________ seconds. 4. Slowly return to the starting position. Repeat __________ times. Complete this exercise __________ times a day. Internal rotation 1. Place your left / right hand behind your back, palm up. 2. Use your other hand to dangle an exercise band, a towel, or a similar object over your shoulder. Grasp the band with your left / right hand so you are holding on to both ends. 3. Gently pull up on the band until you feel a stretch in the front of your left / right shoulder. The movement of your arm toward the center of your body is called internal rotation. ? Avoid shrugging your shoulder while you raise your arm. Keep your shoulder blade tucked down toward the middle of your back. 4. Hold for __________ seconds. 5. Release the stretch by letting go of the band and lowering your hands. Repeat __________ times. Complete this exercise __________ times a day.   Strengthening exercises External rotation 1. Sit in a stable chair without armrests. 2. Secure an exercise band to a stable object at elbow height on your left / right side. 3. Place a soft object, such as a folded towel or a small pillow, between your left / right upper arm and your body to move your elbow about 4 inches (10  cm) away from your side. 4. Hold the end of the exercise band so it is tight and there is no slack. 5. Keeping your elbow pressed against the soft object, slowly move your forearm out, away from your abdomen (external rotation). Keep your body steady so only your forearm moves. 6. Hold for __________ seconds. 7. Slowly return to the starting position. Repeat __________ times. Complete this exercise __________ times a day.   Shoulder abduction 1. Sit in a stable chair without armrests, or stand up. 2. Hold a __________ weight in your left / right hand, or hold an exercise band with both hands. 3. Start with your arms straight down and your left / right palm facing in, toward your body. 4. Slowly lift your left / right hand out to your side (abduction). Do not lift your hand above shoulder height unless your health care provider tells you that this is safe. ? Keep your arms straight. ? Avoid   shrugging your shoulder while you do this movement. Keep your shoulder blade tucked down toward the middle of your back. 5. Hold for __________ seconds. 6. Slowly lower your arm, and return to the starting position. Repeat __________ times. Complete this exercise __________ times a day.   Shoulder extension 1. Sit in a stable chair without armrests, or stand up. 2. Secure an exercise band to a stable object in front of you so it is at shoulder height. 3. Hold one end of the exercise band in each hand. Your palms should face each other. 4. Straighten your elbows and lift your hands up to shoulder height. 5. Step back, away from the secured end of the exercise band, until the band is tight and there is no slack. 6. Squeeze your shoulder blades together as you pull your hands down to the sides of your thighs (extension). Stop when your hands are straight down by your sides. Do not let your hands go behind your body. 7. Hold for __________ seconds. 8. Slowly return to the starting position. Repeat __________  times. Complete this exercise __________ times a day. Shoulder row 1. Sit in a stable chair without armrests, or stand up. 2. Secure an exercise band to a stable object in front of you so it is at waist height. 3. Hold one end of the exercise band in each hand. Position your palms so that your thumbs are facing the ceiling (neutral position). 4. Bend each of your elbows to a 90-degree angle (right angle) and keep your upper arms at your sides. 5. Step back until the band is tight and there is no slack. 6. Slowly pull your elbows back behind you. 7. Hold for __________ seconds. 8. Slowly return to the starting position. Repeat __________ times. Complete this exercise __________ times a day. Shoulder press-ups 1. Sit in a stable chair that has armrests. Sit upright, with your feet flat on the floor. 2. Put your hands on the armrests so your elbows are bent and your fingers are pointing forward. Your hands should be about even with the sides of your body. 3. Push down on the armrests and use your arms to lift yourself off the chair. Straighten your elbows and lift yourself up as much as you comfortably can. ? Move your shoulder blades down, and avoid letting your shoulders move up toward your ears. ? Keep your feet on the ground. As you get stronger, your feet should support less of your body weight as you lift yourself up. 4. Hold for __________ seconds. 5. Slowly lower yourself back into the chair. Repeat __________ times. Complete this exercise __________ times a day.   Wall push-ups 1. Stand so you are facing a stable wall. Your feet should be about one arm-length away from the wall. 2. Lean forward and place your palms on the wall at shoulder height. 3. Keep your feet flat on the floor as you bend your elbows and lean forward toward the wall. 4. Hold for __________ seconds. 5. Straighten your elbows to push yourself back to the starting position. Repeat __________ times. Complete this  exercise __________ times a day.   This information is not intended to replace advice given to you by your health care provider. Make sure you discuss any questions you have with your health care provider. Document Revised: 03/26/2019 Document Reviewed: 01/01/2019 Elsevier Patient Education  2021 [employer]  

## 2021-04-16 NOTE — Assessment & Plan Note (Signed)
Will trial Trazadone She will need to hold Mirtazapine, while taking Trazadone

## 2021-04-23 ENCOUNTER — Ambulatory Visit
Admission: RE | Admit: 2021-04-23 | Discharge: 2021-04-23 | Disposition: A | Discharge status: Home / Self Care | Payer: Medicare HMO | Source: Ambulatory Visit | Attending: Cardiovascular Disease | Admitting: Cardiovascular Disease

## 2021-04-23 ENCOUNTER — Other Ambulatory Visit: Payer: Self-pay

## 2021-04-23 DIAGNOSIS — Z96642 Presence of left artificial hip joint: Secondary | ICD-10-CM | POA: Insufficient documentation

## 2021-04-23 DIAGNOSIS — E782 Mixed hyperlipidemia: Secondary | ICD-10-CM | POA: Insufficient documentation

## 2021-05-02 ENCOUNTER — Telehealth: Payer: Self-pay

## 2021-05-02 DIAGNOSIS — M25511 Pain in right shoulder: Secondary | ICD-10-CM | POA: Diagnosis not present

## 2021-05-02 NOTE — Telephone Encounter (Signed)
Left detail message on VM of pt's recent results okay by DPR, Dr. Rockey Situ advised   "Coronary calcium score of 0  Very good news "  At this time, no further recommendations or medications changes, advised to call office for any concerns or questions, otherwise will see at next visit. Results are also upload to MyChart for review.

## 2021-05-16 ENCOUNTER — Other Ambulatory Visit: Payer: Self-pay | Admitting: Internal Medicine

## 2021-05-16 ENCOUNTER — Other Ambulatory Visit: Payer: Self-pay | Admitting: Family

## 2021-05-17 ENCOUNTER — Ambulatory Visit: Payer: Medicare HMO | Admitting: Dermatology

## 2021-05-17 ENCOUNTER — Other Ambulatory Visit: Payer: Self-pay

## 2021-05-17 DIAGNOSIS — D2372 Other benign neoplasm of skin of left lower limb, including hip: Secondary | ICD-10-CM | POA: Diagnosis not present

## 2021-05-17 DIAGNOSIS — L858 Other specified epidermal thickening: Secondary | ICD-10-CM | POA: Diagnosis not present

## 2021-05-17 DIAGNOSIS — D239 Other benign neoplasm of skin, unspecified: Secondary | ICD-10-CM

## 2021-05-17 DIAGNOSIS — L821 Other seborrheic keratosis: Secondary | ICD-10-CM | POA: Diagnosis not present

## 2021-05-17 DIAGNOSIS — L650 Telogen effluvium: Secondary | ICD-10-CM

## 2021-05-17 NOTE — Patient Instructions (Addendum)
If you have any questions or concerns for your doctor, please call our main line at [phone] and press option 4 to reach your doctor's medical assistant. If no one answers, please leave a voicemail as directed and we will return your call as soon as possible. Messages left after 4 pm will be answered the following business day.   You may also send [place] a message via [place]. We typically respond to MyChart messages within 1-2 business days.  For prescription refills, please ask your pharmacy to contact our office. Our fax number is [phone].  If you have an urgent issue when the clinic is closed that cannot wait until the next business day, you can page your doctor at the number below.    Please note that while we do our best to be available for urgent issues outside of office hours, we are not available 24/7.   If you have an urgent issue and are unable to reach [place], you may choose to seek medical care at your doctor's office, retail clinic, urgent care center, or emergency room.  If you have a medical emergency, please immediately call 911 or go to the emergency department.  Pager Numbers  - Dr. Nehemiah Massed: [phone]  - Dr. Laurence Ferrari: [phone]  - Dr. Nicole Kindred: [phone]  In the event of inclement weather, please call our main line at [phone] for an update on the status of any delays or closures.  Dermatology Medication Tips: Please keep the boxes that topical medications come in in order to help keep track of the instructions about where and how to use these. Pharmacies typically print the medication instructions only on the boxes and not directly on the medication tubes.   If your medication is too expensive, please contact our office at [phone] option 4 or send [place] a message through [place].   We are unable to tell what your co-pay for medications will be in advance as this is different depending on your insurance coverage. However, we may be able to find a substitute  medication at lower cost or fill out paperwork to get insurance to cover a needed medication.   If a prior authorization is required to get your medication covered by your insurance company, please allow [place] 1-2 business days to complete this process.  Drug prices often vary depending on where the prescription is filled and some pharmacies may offer cheaper prices.  The website www.goodrx.com contains coupons for medications through different pharmacies. The prices here do not account for what the cost may be with help from insurance (it may be cheaper with your insurance), but the website can give you the price if you did not use any insurance.  - You can print the associated coupon and take it with your prescription to the pharmacy.  - You may also stop by our office during regular business hours and pick up a GoodRx coupon card.  - If you need your prescription sent electronically to a different pharmacy, notify our office through [facility] or by phone at [phone] option 4.    Recommend minoxidil 5% (Rogaine for men) solution or foam to be applied to the scalp and left in. This should ideally be used twice daily for best results but it helps with hair regrowth when used at least three times per week. Rogaine initially can cause increased hair shedding for the first few weeks but this will stop with continued use. In studies, people who used minoxidil (Rogaine) for at least 6 months  had thicker hair than people who did not. Minoxidil topical (Rogaine) only works as long as it continues to be used. If if it is no longer used then the hair it has been helping to regrow can fall out. Minoxidil topical (Rogaine) can cause increased facial hair growth which can usually be managed easily with a battery-operated hair trimmer. If facial hair growth is bothersome, switching to the 2% women's version can decrease the risk of unwanted facial hair growth.  [place] for hair growth    Biotin 2.5 mg daily

## 2021-05-17 NOTE — Progress Notes (Signed)
New Patient Visit  Subjective  Molly Faulkner is a 80 y.o. female who presents for the following: Skin Problem (Check several spots on the face, abdomen, elbows, treating with otc 2% Salicylic acid ). Patient also complains of hair loss that has been occurring since August 2021.  She has no new medications and no hormone changes.  She is getting older and is 17 now.  She had joint surgery few months ago..  She has not had COVID that she is aware of. No new medications.  No thyroid issue and no symptoms of dry skin or fatigue.  She has not been under particular stress. She had labs done recently that were reviewed.  The following portions of the chart were reviewed this encounter and updated as appropriate:   Tobacco  Allergies  Meds  Problems  Med Hx  Surg Hx  Fam Hx     Review of Systems:  No other skin or systemic complaints except as noted in HPI or Assessment and Plan.  Objective  Well appearing patient in no apparent distress; mood and affect are within normal limits.  A focused examination was performed including scalp,face,elbows, abdomen . Relevant physical exam findings are noted in the Assessment and Plan.  Objective  temporal areas: Diffuse thinning of hair, positive hair pull test.   Objective  right nasal bridge, abdomen: Stuck-on, waxy, tan-brown papule or plaque --Discussed benign etiology and prognosis.   Objective  elbows: Tiny follicular keratotic papules.   Objective  Left Thigh - lateral: Firm pink/brown papulenodule with dimple sign.    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 It does not progress to baldness. It can be triggered by recent illness, recent surgery, thyroid disease, low iron stores, vitamin D deficiency, fad diets or rapid weight loss,  hormonal changes such as pregnancy or birth control pills, and some medication. Usually the hair loss starts 2-3 months after the illness or health change. Rarely, it can continue for longer than a year.   Reviewed labs from November 2021, December 2021, labs WNL including normal thyroid.  [place] for hair growth  Recommend minoxidil 5% (Rogaine for men) solution or foam to be applied to the scalp and left in. This should ideally be used twice daily for best results but it helps with hair regrowth when used at least three times per week. Rogaine initially can cause increased hair shedding for the first few weeks but this will stop with continued use. In studies, people who used minoxidil (Rogaine) for at least 6 months had thicker hair than people who did not. Minoxidil topical (Rogaine) only works as long as it continues to be used. If if it is no longer used then the hair it has been helping to regrow can fall out. Minoxidil topical (Rogaine) can cause increased facial hair growth which can usually be managed easily with a battery-operated hair trimmer. If facial hair growth is bothersome, switching to the 2% women's version can decrease the risk of unwanted facial hair growth.   Start Biotin 2.5 mg vitamins daily   Seborrheic keratosis right nasal bridge, abdomen Benign-appearing.  Observation.  Call clinic for new or changing moles.  Recommend daily use of broad spectrum spf 30+ sunscreen to sun-exposed areas.    Cont otc Salicylic acid daily   Keratosis pilaris elbows Benign-appearing.  Observation.  Call clinic for new or changing moles.  Recommend daily use of broad spectrum spf 30+ sunscreen to sun-exposed areas.    Cont otc Salicylic acid daily   Dermatofibroma Left Thigh - lateral Benign-appearing.  Observation.  Call clinic for new or changing moles.  Recommend daily use of broad spectrum spf 30+ sunscreen to sun-exposed areas.    Return if symptoms worsen or fail to  improve.  IMarye Round, CMA, am acting as scribe for Sarina Ser, MD .  Documentation: I have reviewed the above documentation for accuracy and completeness, and I agree with the above.  Sarina Ser, MD

## 2021-05-18 ENCOUNTER — Encounter: Payer: Self-pay | Admitting: Dermatology

## 2021-05-22 DIAGNOSIS — M25511 Pain in right shoulder: Secondary | ICD-10-CM | POA: Diagnosis not present

## 2021-06-01 DIAGNOSIS — M19012 Primary osteoarthritis, left shoulder: Secondary | ICD-10-CM | POA: Diagnosis not present

## 2021-06-01 DIAGNOSIS — M19011 Primary osteoarthritis, right shoulder: Secondary | ICD-10-CM | POA: Diagnosis not present

## 2021-06-01 DIAGNOSIS — M19019 Primary osteoarthritis, unspecified shoulder: Secondary | ICD-10-CM | POA: Diagnosis not present

## 2021-06-01 DIAGNOSIS — M75121 Complete rotator cuff tear or rupture of right shoulder, not specified as traumatic: Secondary | ICD-10-CM | POA: Diagnosis not present

## 2021-06-05 ENCOUNTER — Ambulatory Visit (INDEPENDENT_AMBULATORY_CARE_PROVIDER_SITE_OTHER): Payer: Medicare HMO | Admitting: Internal Medicine

## 2021-06-05 ENCOUNTER — Encounter: Payer: Self-pay | Admitting: Internal Medicine

## 2021-06-05 ENCOUNTER — Other Ambulatory Visit: Payer: Self-pay

## 2021-06-05 VITALS — BP 132/61 | HR 65 | Temp 97.7°F | Resp 17 | Ht 61.0 in | Wt 181.1 lb

## 2021-06-05 DIAGNOSIS — W57XXXA Bitten or stung by nonvenomous insect and other nonvenomous arthropods, initial encounter: Secondary | ICD-10-CM | POA: Diagnosis not present

## 2021-06-05 DIAGNOSIS — B888 Other specified infestations: Secondary | ICD-10-CM | POA: Diagnosis not present

## 2021-06-05 MED ORDER — TRIAMCINOLONE ACETONIDE 0.1 % EX CREA: 1.0000 "application " | TOPICAL_CREAM | Freq: Two times a day (BID) | CUTANEOUS | 0 refills | Status: DC

## 2021-06-05 NOTE — Patient Instructions (Signed)
Insect Bite, Adult An insect bite can make your skin red, itchy, and swollen. Some insects can spread disease to people with a bite. However, most insect bites do not lead todisease, and most are not serious. What are the causes? Insects may bite for many reasons, including: Hunger. To defend themselves. Insects that bite include: Spiders. Mosquitoes. Ticks. Fleas. Ants. Flies. Kissing bugs. Chiggers. What are the signs or symptoms? Symptoms of this condition include: Itching or pain in the bite area. Redness and swelling in the bite area. An open wound (skin ulcer). Symptoms often last for 2-4 days. In rare cases, a person may have a very bad allergic reaction (anaphylactic reaction) to a bite. Symptoms of an anaphylactic reaction may include: Feeling warm in the face (flushed). Your face may turn red. Itchy, red, swollen areas of skin (hives). Swelling of the: Eyes. Lips. Face. Mouth. Tongue. Throat. Trouble with any of these: Breathing. Talking. Swallowing. Loud breathing (wheezing). Feeling dizzy or light-headed. Passing out (fainting). Pain or cramps in your belly. Throwing up (vomiting). Watery poop (diarrhea). How is this treated? Treatment is usually not needed. Symptoms often go away on their own. When treatment is needed, it may involve: Putting a cream or lotion on the bite area. This helps with itching. Taking an antibiotic medicine. This treatment is needed if the bite area gets infected. Getting a tetanus shot, if you are not up to date on this vaccine. Putting ice on the affected area. Using medicines called antihistamines. This treatment may be needed if you have itching or an allergic reaction to the insect bite. Giving yourself a shot of medicine (epinephrine) using an auto-injector "pen" if you have an anaphylactic reaction to a bite. Your doctor will teach you how to use this pen. Follow these instructions at home: Bite area care  Do not scratch  the bite area. Keep the bite area clean and dry. Wash the bite area every day with soap and water as told by your doctor. Check the bite area every day for signs of infection. Check for: Redness, swelling, or pain. Fluid or blood. Warmth. Pus or a bad smell.  Managing pain, itching, and swelling  You may put any of these on the bite area as told by your doctor: A paste made of baking soda and water. Cortisone cream. Calamine lotion. If told, put ice on the bite area. Put ice in a plastic bag. Place a towel between your skin and the bag. Leave the ice on for 20 minutes, 2-3 times a day.  General instructions Apply or take over-the-counter and prescription medicines only as told by your doctor. If you were prescribed an antibiotic medicine, take or apply it as told by your doctor. Do not stop using the antibiotic even if your condition improves. Keep all follow-up visits as told by your doctor. This is important. How is this prevented? To help you have a lower risk of insect bites: When you are outside, wear clothing that covers your arms and legs. Use insect repellent. The best insect repellents contain one of these: DEET. Picaridin. Oil of lemon eucalyptus (OLE). [id number]. Consider spraying your clothing with a pesticide called permethrin. Permethrin helps prevent insect bites. It works for several weeks and for up to 5-6 clothing washes. Do not apply permethrin directly to the skin. If your home windows do not have screens, think about putting some in. If you will be sleeping in an area where there are mosquitoes, consider covering your sleeping area with a  mosquito net. Contact a doctor if: You have redness, swelling, or pain in the bite area. You have fluid or blood coming from the bite area. The bite area feels warm to the touch. You have pus or a bad smell coming from the bite area. You have a fever. Get help right away if: You have joint pain. You have a rash. You feel  more tired or sleepy than you normally do. You have neck pain. You have a headache. You feel weaker than you normally do. You have signs of an anaphylactic reaction. Signs may include: Feeling warm in the face. Itchy, red, swollen areas of skin. Swelling of your: Eyes. Lips. Face. Mouth. Tongue. Throat. Trouble with any of these: Breathing. Talking. Swallowing. Loud breathing. Feeling dizzy or light-headed. Passing out. Pain or cramps in your belly. Throwing up. Watery poop. These symptoms may be an emergency. Do not wait to see if the symptoms will go away. Do this right away: Use your auto-injector pen as you have been told. Get medical help. Call your local emergency services (911 in the [place] Do not drive yourself to the hospital. Summary An insect bite can make your skin red, itchy, and swollen. Treatment is usually not needed. Symptoms often go away on their own. Do not scratch the bite area. Keep it clean and dry. Ice can help with pain and itching from the bite. This information is not intended to replace advice given to you by your health care provider. Make sure you discuss any questions you have with your healthcare provider. Document Revised: 10/24/2020 Document Reviewed: 06/12/2018 Elsevier Patient Education  2022 [employer].

## 2021-06-05 NOTE — Progress Notes (Signed)
Subjective:    Patient ID: Molly Faulkner, female    DOB: 04/07/1941, 80 y.o.   MRN: [id number]  HPI  Patient presents to clinic today with complaint of bedbug bites.  She noticed this 2 days ago.  She reports the bites itch and have continued to spread although she has not noticed any new bites today.  She reports she has actually seen a bedbug crawling on her.  She denies changes in soaps lotions or detergents.  She has already started to wash her things in hot water.  She has not tried any medications OTC for this.  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     ADVAIR HFA 115-21 MCG/ACT inhaler INHALE 2 PUFFS BY MOUTH INTO THE LUNGS TWICE DAILY AS NEEDED 12 g 2   atenolol (TENORMIN) 25 MG tablet TAKE 1 TABLET TWICE DAILY 180 tablet 3   cholecalciferol (VITAMIN D) 1000 units tablet Take 1,000 Units by mouth every other day. At bedtime.     CREAM BASE EX Apply 1-2 application topically 4 (four) times daily as needed (for arthritis pain.). Penetrex Inflammation Formula (Arnica/Pyridoxine/MSM/Boswellia/Cetyl Myristoleate)     cyclobenzaprine (FLEXERIL) 5 MG tablet Take 5 mg by mouth at bedtime.     fluticasone (FLONASE) 50 MCG/ACT nasal spray Place into both nostrils daily.     Glucos-Chondroit-Hyaluron-MSM (GLUCOSAMINE CHONDROITIN JOINT PO) Take by mouth.      hydrochlorothiazide (HYDRODIURIL) 25 MG tablet TAKE 1 TABLET EVERY DAY 90 tablet 0   HYDROcodone-acetaminophen (NORCO/VICODIN) 5-325 MG tablet Take 1 tablet by mouth daily as needed for moderate pain. (Patient taking differently: Take 0.25 tablets by mouth daily as needed for moderate pain.) 15 tablet 0   ibuprofen (ADVIL,MOTRIN) 200 MG tablet Take 200 mg by mouth at bedtime as needed for moderate pain (for arthritis pain.).     Lidocaine 4 % PTCH Apply 1 patch topically daily as needed (pain).     losartan (COZAAR) 50 MG tablet TAKE 1/2 TABLET IN THE MORNING  AND TAKE 1/2 TABLET IN THE EVENING 90 tablet 1   Menthol-Methyl Salicylate (PAIN RELIEVING) CREA Apply 1 application topically 4 (four) times daily as needed (for arthritis pain). EFAC PAIN RELIEVING CREAM     Menthol-Methyl Salicylate (SALONPAS PAIN RELIEF PATCH EX) Apply 1 patch topically daily as needed (pain).     mirtazapine (REMERON SOL-TAB) 15 MG disintegrating tablet DISSOLVE 1 TABLET  ON  TONGUE AT BEDTIME (Patient taking differently: Take 1 mg by mouth at bedtime as needed (insomnia).) 20 tablet 0   OVER THE COUNTER MEDICATION Take 1 tablet by mouth 3 (three) times daily with meals. Cholester Care  traZODone (DESYREL) 50 MG tablet Take 0.5-1 tablets (25-50 mg total) by mouth at bedtime as needed for sleep. 30 tablet 0   No current facility-administered medications for this visit.    Allergies  Allergen Reactions   Avelox [Moxifloxacin Hcl In Nacl] Other (See Comments)    Chills   Trazodone And Nefazodone Swelling   Clarithromycin Other (See Comments)    Unsure of reaction type    Family History  Problem Relation Age of Onset   Breast cancer Mother 49   Arthritis Maternal Grandmother    Diabetes Maternal Grandmother     Social History   Socioeconomic History   Marital status: Single    Spouse name: Not on file   Number of children: Not on file   Years of education: Not on file   Highest education level: Not  on file  Occupational History   Not on file  Tobacco Use   Smoking status: Former    Packs/day: 0.50    Years: 7.00    Pack years: 3.50    Types: Cigarettes   Smokeless tobacco: Never   Tobacco comments:    quit over 40 years ago  Vaping Use   Vaping Use: Never used  Substance and Sexual Activity   Alcohol use: Yes    Comment: rare--liquor   Drug use: No   Sexual activity: Not Currently  Other Topics Concern   Not on file  Social History Narrative   Not on file   Social Determinants of Health   Financial Resource Strain: Not on file  Food Insecurity: Not on file  Transportation Needs: Not on file  Physical Activity: Not on file  Stress: Not on file  Social Connections: Not on file  Intimate Partner Violence: Not on file     Constitutional: Denies fever, malaise, fatigue, headache or abrupt weight changes.  Respiratory: Denies difficulty breathing, shortness of breath, cough or sputum production.   Cardiovascular: Denies chest pain, chest tightness, palpitations or swelling in the hands or feet.  Skin: Patient reports insect bites.  Denies ulcercations.   No other specific complaints in a complete review of systems (except as listed in HPI above).  Objective:   Physical Exam  BP 132/61 (BP Location: Left Arm, Patient Position: Sitting, Cuff Size: Large)   Pulse 65   Temp 97.7 F (36.5 C) (Temporal)   Resp 17   Ht 5\' 1"  (1.549 m)   Wt 181 lb 1 oz (82.1 kg)   SpO2 99%   BMI 34.21 kg/m   Wt Readings from Last 3 Encounters:  04/16/21 178 lb (80.7 kg)  01/22/21 181 lb (82.1 kg)  12/20/20 180 lb 3.2 oz (81.7 kg)    General: Appears her stated age, obese in NAD. Skin: Grouped papular lesions noted of the right axilla, right bra line, and bilateral groins. Cardiovascular: Normal rate. Pulmonary/Chest: Normal effort . Neurological: Alert and oriented.    BMET    Component Value Date/Time   NA 139 12/05/2020 1439   NA 140 01/10/2020 0950   K 3.7  12/05/2020 1439   CL 104 12/05/2020 1439   CO2 30 12/05/2020 1439   GLUCOSE 86 12/05/2020 1439   BUN 15 12/05/2020 1439   BUN 15 01/10/2020 0950   CREATININE 0.91 12/05/2020 1439   CREATININE 0.99 (H) 05/29/2018 1547   CALCIUM 10.2 12/05/2020 1439   GFRNONAA 64 01/10/2020 0950   GFRAA 74 01/10/2020 0950    Lipid Panel     Component Value Date/Time  CHOL 188 03/22/2020 1139   CHOL 203 (H) 01/10/2020 0950   TRIG 44 03/22/2020 1139   HDL 59 03/22/2020 1139   HDL 63 01/10/2020 0950   CHOLHDL 3.2 03/22/2020 1139   VLDL 9 03/22/2020 1139   LDLCALC 120 (H) 03/22/2020 1139   LDLCALC 129 (H) 01/10/2020 0950   LDLCALC 110 (H) 05/29/2018 1547    CBC    Component Value Date/Time   WBC 6.0 12/05/2020 1439   RBC 4.13 12/05/2020 1439   HGB 13.5 12/05/2020 1439   HGB 13.9 01/10/2020 0950   HCT 38.4 12/05/2020 1439   HCT 40.1 01/10/2020 0950   PLT 202.0 12/05/2020 1439   PLT 227 01/10/2020 0950   MCV 93.1 12/05/2020 1439   MCV 97 01/10/2020 0950   MCH 33.6 (H) 01/10/2020 0950   MCH 33.3 (H) 05/29/2018 1547   MCHC 35.2 12/05/2020 1439   RDW 12.6 12/05/2020 1439   RDW 12.3 01/10/2020 0950   LYMPHSABS 1.0 01/10/2020 0950   EOSABS 0.3 01/10/2020 0950   BASOSABS 0.0 01/10/2020 0950    Hgb A1C Lab Results  Component Value Date   HGBA1C 5.0 12/05/2020           Assessment & Plan:   Bedbug bites:  She is already began to wash her belongings in hot water Encouraged her to get a mattress cover or replace her mattress Rx for Triamcinolone 0.1% twice daily as needed  Return precautions discussed  Webb Silversmith, NP This visit occurred during the SARS-CoV-2 public health emergency.  Safety protocols were in place, including screening questions prior to the visit, additional usage of staff PPE, and extensive cleaning of exam room while observing appropriate contact time as indicated for disinfecting solutions.

## 2021-06-19 ENCOUNTER — Encounter: Payer: Self-pay | Admitting: Internal Medicine

## 2021-06-19 ENCOUNTER — Other Ambulatory Visit: Payer: Self-pay

## 2021-06-19 ENCOUNTER — Ambulatory Visit (INDEPENDENT_AMBULATORY_CARE_PROVIDER_SITE_OTHER): Payer: Medicare HMO | Admitting: Internal Medicine

## 2021-06-19 VITALS — BP 143/88 | HR 88 | Temp 97.7°F | Resp 16 | Ht 61.0 in | Wt 178.8 lb

## 2021-06-19 DIAGNOSIS — F419 Anxiety disorder, unspecified: Secondary | ICD-10-CM | POA: Diagnosis not present

## 2021-06-19 DIAGNOSIS — S20369A Insect bite (nonvenomous) of unspecified front wall of thorax, initial encounter: Secondary | ICD-10-CM

## 2021-06-19 DIAGNOSIS — W57XXXA Bitten or stung by nonvenomous insect and other nonvenomous arthropods, initial encounter: Secondary | ICD-10-CM

## 2021-06-19 DIAGNOSIS — L299 Pruritus, unspecified: Secondary | ICD-10-CM | POA: Diagnosis not present

## 2021-06-19 MED ORDER — PERMETHRIN 5 % EX CREA: TOPICAL_CREAM | CUTANEOUS | 1 refills | Status: DC

## 2021-06-19 NOTE — Patient Instructions (Signed)
Pruritus Pruritus is an itchy feeling on the skin. One of the most common causes is dry skin, but many different things can cause itching. Most cases of itching do notrequire medical attention. Sometimes itchy skin can turn into a rash. Follow these instructions at home: Skin care  Apply moisturizing lotion to your skin as needed. Lotion that contains petroleum jelly is best. Take medicines or apply medicated creams only as told by your health care provider. This may include: Corticosteroid cream. Anti-itch lotions. Oral antihistamines. Apply a cool, wet cloth (cool compress) to the affected areas. Take baths with one of the following: Epsom salts. You can get these at your local pharmacy or grocery store. Follow the instructions on the packaging. Baking soda. Pour a small amount into the bath as told by your health care provider. Colloidal oatmeal. You can get this at your local pharmacy or grocery store. Follow the instructions on the packaging. Apply baking soda paste to your skin. To make the paste, stir water into a small amount of baking soda until it reaches a paste-like consistency. Do not scratch your skin. Do not take hot showers or baths, which can make itching worse. A cool shower may help with itching as long as you apply moisturizing lotion after the shower. Do not use scented soaps, detergents, perfumes, and cosmetic products. Instead, use gentle, unscented versions of these items.  General instructions Avoid wearing tight clothes. Keep a journal to help find out what is causing your itching. Write down: What you eat and drink. What cosmetic products you use. What soaps or detergents you use. What you wear, including jewelry. Use a humidifier. This keeps the air moist, which helps to prevent dry skin. Be aware of any changes in your itchiness. Contact a health care provider if: The itching does not go away after several days. You are unusually thirsty or urinating more  than normal. Your skin tingles or feels numb. Your skin or the white parts of your eyes turn yellow (jaundice). You feel weak. You have any of the following: Night sweats. Tiredness (fatigue). Weight loss. Abdominal pain. Summary Pruritus is an itchy feeling on the skin. One of the most common causes is dry skin, but many different conditions and factors can cause itching. Apply moisturizing lotion to your skin as needed. Lotion that contains petroleum jelly is best. Take medicines or apply medicated creams only as told by your health care provider. Do not take hot showers or baths. Do not use scented soaps, detergents, perfumes, or cosmetic products. This information is not intended to replace advice given to you by your health care provider. Make sure you discuss any questions you have with your healthcare provider. Document Revised: 12/16/2017 Document Reviewed: 12/16/2017 Elsevier Patient Education  2022 [employer]  

## 2021-06-19 NOTE — Progress Notes (Signed)
Subjective:    Patient ID: Molly Faulkner, female    DOB: 1941/11/28, 80 y.o.   MRN: [id number]  HPI  Patient presents the clinic today for follow-up of bedbug bites.  She was seen 6/21 for the same.  She was given triamcinolone cream to take as needed for itching.  She has noticed some continued bug bites in between her breasts.  She reports these areas do itch.  She has not seen any more bedbugs in her home.  She has had her home fogged by a professional.  She reports she has washed all of her belongings in hot water and dried them for at least an hour.  She is currently staying in a hotel.  She reports she has seen ants in her hotel but no other bugs.  She reports itching all over from head to toe and feeling like things are crawling on her even though she does not see them.  She reports this is causing her some anxiety.  She is taking an oral antihistamine daily.  She reports she is taking 2-3 hot showers daily as well.  She is not using any lotion.  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     ADVAIR HFA 115-21 MCG/ACT inhaler INHALE 2 PUFFS BY MOUTH INTO THE LUNGS TWICE DAILY AS NEEDED 12 g 2   atenolol (TENORMIN) 25 MG tablet TAKE 1 TABLET TWICE DAILY 180 tablet 3   cholecalciferol (VITAMIN D) 1000 units tablet Take 1,000 Units by mouth every other day.  At bedtime.     CREAM BASE EX Apply 1-2 application topically 4 (four) times daily as needed (for arthritis pain.). Penetrex Inflammation Formula (Arnica/Pyridoxine/MSM/Boswellia/Cetyl Myristoleate)     cyclobenzaprine (FLEXERIL) 5 MG tablet Take 5 mg by mouth at bedtime.     fluticasone (FLONASE) 50 MCG/ACT nasal spray Place into both nostrils daily.     Glucos-Chondroit-Hyaluron-MSM (GLUCOSAMINE CHONDROITIN JOINT PO) Take by mouth. (Patient not taking: Reported on 06/05/2021)     hydrochlorothiazide (HYDRODIURIL) 25 MG tablet TAKE 1 TABLET EVERY DAY 90 tablet 0   HYDROcodone-acetaminophen (NORCO/VICODIN) 5-325 MG tablet Take 1 tablet by mouth daily as needed for moderate pain. (Patient taking differently: Take 0.25 tablets by mouth daily as needed for moderate pain.) 15 tablet 0   ibuprofen (ADVIL,MOTRIN) 200 MG tablet Take 200 mg by mouth at bedtime as needed for moderate pain (for arthritis pain.).     Lidocaine 4 % PTCH Apply 1 patch topically daily as needed (pain).     losartan (COZAAR) 50 MG tablet TAKE 1/2 TABLET IN THE MORNING  AND TAKE 1/2 TABLET IN THE EVENING 90 tablet 1  Menthol-Methyl Salicylate (PAIN RELIEVING) CREA Apply 1 application topically 4 (four) times daily as needed (for arthritis pain). EFAC PAIN RELIEVING CREAM     Menthol-Methyl Salicylate (SALONPAS PAIN RELIEF PATCH EX) Apply 1 patch topically daily as needed (pain).     mirtazapine (REMERON SOL-TAB) 15 MG disintegrating tablet DISSOLVE 1 TABLET  ON  TONGUE AT BEDTIME (Patient taking differently: Take 1 mg by mouth at bedtime as needed (insomnia).) 20 tablet 0   OVER THE COUNTER MEDICATION Take 1 tablet by mouth 3 (three) times daily with meals. Cholester Care     traZODone (DESYREL) 50 MG tablet Take 0.5-1 tablets (25-50 mg total) by mouth at bedtime as needed for sleep. 30 tablet 0   triamcinolone cream (KENALOG) 0.1 % Apply 1 application topically 2 (two) times daily. 30 g 0   No current facility-administered  medications for this visit.    Allergies  Allergen Reactions   Avelox [Moxifloxacin Hcl In Nacl] Other (See Comments)    Chills   Trazodone And Nefazodone Swelling   Clarithromycin Other (See Comments)    Unsure of reaction type    Family History  Problem Relation Age of Onset   Breast cancer Mother 72   Arthritis Maternal Grandmother    Diabetes Maternal Grandmother     Social History   Socioeconomic History   Marital status: Single    Spouse name: Not on file   Number of children: Not on file   Years of education: Not on file   Highest education level: Not on file  Occupational History   Not on file  Tobacco Use   Smoking status: Former    Packs/day: 0.50    Years: 7.00    Pack years: 3.50    Types: Cigarettes   Smokeless tobacco: Never   Tobacco comments:    quit over 40 years ago  Vaping Use   Vaping Use: Never used  Substance and Sexual Activity   Alcohol use: Yes    Comment: rare--liquor   Drug use: No   Sexual activity: Not Currently  Other Topics Concern   Not on file  Social History Narrative   Not on file   Social Determinants of Health   Financial Resource Strain: Not on file  Food Insecurity: Not on file  Transportation Needs: Not on file  Physical Activity: Not on file  Stress: Not on file  Social Connections: Not on file  Intimate Partner Violence: Not on file     Constitutional: Denies fever, malaise, fatigue, headache or abrupt weight changes.  HEENT: Denies eye pain, eye redness, ear pain, ringing in the ears, wax buildup, runny nose, nasal congestion, bloody nose, or sore throat. Respiratory: Denies difficulty breathing, shortness of breath, cough or sputum production.   Cardiovascular: Denies chest pain, chest tightness, palpitations or swelling in the hands or feet.  Skin: Patient reports insect bite of chest, chronic itching.  Denies redness, rashes or ulcercations.  Neurological: Patient reports sensation of bugs crawling on  her.  Denies dizziness, difficulty with memory, difficulty with speech or problems with balance and coordination.  Psych: Patient has a history of anxiety depression.  Denies SI/HI.  No other specific complaints in a complete review of systems (except as listed in HPI above).  Objective:   Physical Exam    BP (!) 143/88   Pulse 88   Temp 97.7 F (36.5 C) (Temporal)   Resp 16   Ht 5\' 1"  (1.549 m)   Wt 178 lb 12.8 oz (  81.1 kg)   SpO2 100%   BMI 33.78 kg/m   Wt Readings from Last 3 Encounters:  06/05/21 181 lb 1 oz (82.1 kg)  04/16/21 178 lb (80.7 kg)  01/22/21 181 lb (82.1 kg)    General: Appears her stated age, obese, in NAD. Skin: Warm, dry and intact.  Scattered maculopapular lesions noted overlying the breastbone.  No burrowing noted between the fingers.  No lice noted of the scalp. HEENT: Head: normal shape and size;  Cardiovascular: Normal rate. Pulmonary/Chest: Normal effort. Neurological: Alert and oriented.  Psychiatric: Mildly anxious appearing.  BMET    Component Value Date/Time   NA 139 12/05/2020 1439   NA 140 01/10/2020 0950   K 3.7 12/05/2020 1439   CL 104 12/05/2020 1439   CO2 30 12/05/2020 1439   GLUCOSE 86 12/05/2020 1439   BUN 15 12/05/2020 1439   BUN 15 01/10/2020 0950   CREATININE 0.91 12/05/2020 1439   CREATININE 0.99 (H) 05/29/2018 1547   CALCIUM 10.2 12/05/2020 1439   GFRNONAA 64 01/10/2020 0950   GFRAA 74 01/10/2020 0950    Lipid Panel     Component Value Date/Time   CHOL 188 03/22/2020 1139   CHOL 203 (H) 01/10/2020 0950   TRIG 44 03/22/2020 1139   HDL 59 03/22/2020 1139   HDL 63 01/10/2020 0950   CHOLHDL 3.2 03/22/2020 1139   VLDL 9 03/22/2020 1139   LDLCALC 120 (H) 03/22/2020 1139   LDLCALC 129 (H) 01/10/2020 0950   LDLCALC 110 (H) 05/29/2018 1547    CBC    Component Value Date/Time   WBC 6.0 12/05/2020 1439   RBC 4.13 12/05/2020 1439   HGB 13.5 12/05/2020 1439   HGB 13.9 01/10/2020 0950   HCT 38.4 12/05/2020 1439    HCT 40.1 01/10/2020 0950   PLT 202.0 12/05/2020 1439   PLT 227 01/10/2020 0950   MCV 93.1 12/05/2020 1439   MCV 97 01/10/2020 0950   MCH 33.6 (H) 01/10/2020 0950   MCH 33.3 (H) 05/29/2018 1547   MCHC 35.2 12/05/2020 1439   RDW 12.6 12/05/2020 1439   RDW 12.3 01/10/2020 0950   LYMPHSABS 1.0 01/10/2020 0950   EOSABS 0.3 01/10/2020 0950   BASOSABS 0.0 01/10/2020 0950    Hgb A1C Lab Results  Component Value Date   HGBA1C 5.0 12/05/2020          Assessment & Plan:   Insect Bites of Chest, Pruritis,Anxiety:  Continue triamcinolone cream We will try Permethrin x1 can repeat in 2 weeks if needed Advised her to stop taking frequent showers and use warm water instead of hot water as this is likely drying out her skin and causing her to itch more Encouraged use of moisturizing lotion twice daily Continue daily oral antihistamine Consider referral to dermatology if symptoms persist or worsen  Return precautions discussed  Webb Silversmith, NP This visit occurred during the SARS-CoV-2 public health emergency.  Safety protocols were in place, including screening questions prior to the visit, additional usage of staff PPE, and extensive cleaning of exam room while observing appropriate contact time as indicated for disinfecting solutions.

## 2021-07-12 DIAGNOSIS — G4733 Obstructive sleep apnea (adult) (pediatric): Secondary | ICD-10-CM | POA: Diagnosis not present

## 2021-07-17 ENCOUNTER — Other Ambulatory Visit (HOSPITAL_COMMUNITY)
Admission: RE | Admit: 2021-07-17 | Discharge: 2021-07-17 | Disposition: A | Discharge status: Home / Self Care | Payer: Medicare HMO | Source: Ambulatory Visit | Attending: Internal Medicine | Admitting: Internal Medicine

## 2021-07-17 ENCOUNTER — Other Ambulatory Visit: Payer: Self-pay

## 2021-07-17 ENCOUNTER — Encounter: Payer: Self-pay | Admitting: Internal Medicine

## 2021-07-17 ENCOUNTER — Ambulatory Visit (INDEPENDENT_AMBULATORY_CARE_PROVIDER_SITE_OTHER): Payer: Medicare HMO | Admitting: Internal Medicine

## 2021-07-17 VITALS — BP 125/60 | HR 61 | Temp 97.7°F | Resp 18 | Ht 61.0 in | Wt 179.6 lb

## 2021-07-17 DIAGNOSIS — I10 Essential (primary) hypertension: Secondary | ICD-10-CM

## 2021-07-17 DIAGNOSIS — Z1211 Encounter for screening for malignant neoplasm of colon: Secondary | ICD-10-CM

## 2021-07-17 DIAGNOSIS — M479 Spondylosis, unspecified: Secondary | ICD-10-CM | POA: Diagnosis not present

## 2021-07-17 DIAGNOSIS — N898 Other specified noninflammatory disorders of vagina: Secondary | ICD-10-CM | POA: Diagnosis not present

## 2021-07-17 DIAGNOSIS — Z9989 Dependence on other enabling machines and devices: Secondary | ICD-10-CM

## 2021-07-17 DIAGNOSIS — G4733 Obstructive sleep apnea (adult) (pediatric): Secondary | ICD-10-CM | POA: Diagnosis not present

## 2021-07-17 DIAGNOSIS — Z0000 Encounter for general adult medical examination without abnormal findings: Secondary | ICD-10-CM | POA: Diagnosis not present

## 2021-07-17 DIAGNOSIS — K219 Gastro-esophageal reflux disease without esophagitis: Secondary | ICD-10-CM

## 2021-07-17 DIAGNOSIS — J4540 Moderate persistent asthma, uncomplicated: Secondary | ICD-10-CM | POA: Diagnosis not present

## 2021-07-17 DIAGNOSIS — F329 Major depressive disorder, single episode, unspecified: Secondary | ICD-10-CM | POA: Diagnosis not present

## 2021-07-17 DIAGNOSIS — E782 Mixed hyperlipidemia: Secondary | ICD-10-CM | POA: Diagnosis not present

## 2021-07-17 DIAGNOSIS — F5101 Primary insomnia: Secondary | ICD-10-CM

## 2021-07-17 NOTE — Assessment & Plan Note (Signed)
Encouraged weight loss as this can help reduce sleep apnea symptoms Continue CPAP 

## 2021-07-17 NOTE — Assessment & Plan Note (Signed)
CMET and lipid profile today Encouraged her to consume a low fat diet 

## 2021-07-17 NOTE — Assessment & Plan Note (Signed)
Continue Hydrocodone and Flexeril Encouraged regular physical activity Encouraged weight loss as this can help to reduce joint pain.

## 2021-07-17 NOTE — Assessment & Plan Note (Signed)
Currently not an issue of meds Support offered

## 2021-07-17 NOTE — Patient Instructions (Signed)

## 2021-07-17 NOTE — Assessment & Plan Note (Signed)
Currently not an issues off meds Avoid foods that trigger your reflux Encouraged weight loss as this can help reduce reflux symptoms

## 2021-07-17 NOTE — Progress Notes (Signed)
HPI:  Pt presents to the clinic today for her subsequent annual Medicare Wellness Exam.   OA: Mainly in her hands and back. She saw her chiropractor yesterday. She takes Flexeril and Hydrocodone as needed with some relief.   Asthma: She denies chronic cough or shortness of breath. She is using Advair daily. She has Albuterol on hand in case she needs this. There are no PFT's on file. She follows with pulmonology.  Depression: Currently not an issue off meds. She is not currently seeing her therapist. She denies anxiety, SI/HI.  GERD: Triggered by acidic foods. Currently not an issue and not taking any medications for this. There is no upper GI on file.  HLD: Her last LDL was 120, triglycerides 44, 03/2020. She is not taking any cholesterol lowering medication at this time. She tries to consume a low fat diet.  HTN: Her BP today is 125/60. She is taking Losartan, HCTZ and Atenolol as prescribed. ECG from 01/2021 reviewed.  OSA: She sleeps well with the use of her CPAP. She has not had to take Mirtazapine in the last 3 in the last 3 nights. Sleep study from 11/2016 reviewed.  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     ADVAIR HFA 115-21 MCG/ACT inhaler INHALE 2 PUFFS BY MOUTH INTO THE LUNGS TWICE DAILY AS NEEDED 12 g 2   atenolol (TENORMIN) 25 MG tablet TAKE 1 TABLET TWICE DAILY  180 tablet 3   cholecalciferol (VITAMIN D) 1000 units tablet Take 1,000 Units by mouth every other day. At bedtime.     CREAM BASE EX Apply 1-2 application topically 4 (four) times daily as needed (for arthritis pain.). Penetrex Inflammation Formula (Arnica/Pyridoxine/MSM/Boswellia/Cetyl Myristoleate)     cyclobenzaprine (FLEXERIL) 5 MG tablet Take 5 mg by mouth at bedtime.     fluticasone (FLONASE) 50 MCG/ACT nasal spray Place into both nostrils daily.     hydrochlorothiazide (HYDRODIURIL) 25 MG tablet TAKE 1 TABLET EVERY DAY 90 tablet 0   HYDROcodone-acetaminophen (NORCO/VICODIN) 5-325 MG tablet Take 1 tablet by mouth daily as needed for moderate pain. (Patient taking differently: Take 0.25 tablets by mouth daily as needed for moderate pain.) 15 tablet 0   ibuprofen (ADVIL,MOTRIN) 200 MG tablet Take 200 mg by mouth at bedtime as needed for moderate pain (for arthritis pain.).     Lidocaine 4 % PTCH Apply 1 patch topically daily as needed (pain).     losartan (COZAAR) 50 MG tablet TAKE 1/2 TABLET IN THE MORNING  AND TAKE 1/2 TABLET IN THE EVENING 90 tablet 1   Menthol-Methyl Salicylate (PAIN RELIEVING) CREA Apply 1 application topically 4 (four) times daily as needed (for arthritis pain). [facility] PAIN RELIEVING CREAM  Menthol-Methyl Salicylate (SALONPAS PAIN RELIEF PATCH EX) Apply 1 patch topically daily as needed (pain).     mirtazapine (REMERON SOL-TAB) 15 MG disintegrating tablet DISSOLVE 1 TABLET  ON  TONGUE AT BEDTIME (Patient taking differently: Take 1 mg by mouth at bedtime as needed (insomnia).) 20 tablet 0   OVER THE COUNTER MEDICATION Take 1 tablet by mouth 3 (three) times daily with meals. Cholester Care     permethrin (ELIMITE) 5 % cream Apply cream whole body, from head to toe at bedtime; leave on for 8 to 12 hours (overnight), wash off next day. Repeat in 14 days if needed. 60 g 1   triamcinolone cream (KENALOG) 0.1 % Apply 1 application topically 2 (two) times daily. 30 g 0   No  current facility-administered medications for this visit.    Allergies  Allergen Reactions   Avelox [Moxifloxacin Hcl In Nacl] Other (See Comments)    Chills   Trazodone And Nefazodone Swelling   Clarithromycin Other (See Comments)    Unsure of reaction type    Family History  Problem Relation Age of Onset   Breast cancer Mother 24   Arthritis Maternal Grandmother    Diabetes Maternal Grandmother     Social History   Socioeconomic History   Marital status: Single    Spouse name: Not on file   Number of children: Not on file   Years of education: Not on file   Highest education level: Not on file  Occupational History   Not on file  Tobacco Use   Smoking status: Former    Packs/day: 0.50    Years: 7.00    Pack years: 3.50    Types: Cigarettes   Smokeless tobacco: Never   Tobacco comments:    quit over 40 years ago  Vaping Use   Vaping Use: Never used  Substance and Sexual Activity   Alcohol use: Yes    Comment: rare--liquor   Drug use: No   Sexual activity: Not Currently  Other Topics Concern   Not on file  Social History Narrative   Not on file   Social Determinants of Health   Financial Resource Strain: Not on file  Food Insecurity: Not on file  Transportation Needs: Not on file  Physical Activity: Not on file  Stress: Not on file  Social Connections: Not on file  Intimate Partner Violence: Not on file    Hospitiliaztions: None  Health Maintenance:    Flu: 08/2020  Tetanus: 12/2011  Pneumovax: 01/2012  Prevnar: 02/2016  Zostavax: 12/2011  Mammogram: 08/2020  Pap Smear: no longer screening  Bone Density: 04/2017  Colon Screening: 06/2018, 3 years  Eye Doctor: annually  Dental Exam: biannually   Providers:   PCP: Webb Silversmith, NP-C             Neurologist: Dr. Melrose Nakayama             Cardiologist: Dr. Rockey Situ             Podiatry: Dr. Amalia Hailey             Gastroenterologist: Dr. Vicente Males             Pulmonologist: Dr. Mortimer Fries   I have personally reviewed  and have noted:  1. The patient's medical and social history 2. Their use of alcohol, tobacco or illicit drugs 3. Their current medications and supplements 4. The patient's functional ability including ADL's, fall risks, home safety risks and hearing or visual impairment. 5. Diet and physical  activities 6. Evidence for depression or mood disorder  Subjective:   Review of Systems:   Constitutional: Denies fever, malaise, fatigue, headache or abrupt weight changes.  HEENT: Denies eye pain, eye redness, ear pain, ringing in the ears, wax buildup, runny nose, nasal congestion, bloody nose, or sore throat. Respiratory: Denies difficulty breathing, shortness of breath, cough or sputum production.   Cardiovascular: Denies chest pain, chest tightness, palpitations or swelling in the hands or feet.  Gastrointestinal: Denies abdominal pain, bloating, constipation, diarrhea or blood in the stool.  GU: Pt reports vaginal discharge. Denies urgency, frequency, pain with urination, burning sensation, blood in urine, odor. Musculoskeletal: Pt reports joint pain in hands and back, swelling around left ankle. Denies decrease in range of motion, difficulty with gait, muscle pain.  Skin: Denies redness, rashes, lesions or ulcercations.  Neurological: Denies dizziness, difficulty with memory, difficulty with speech or problems with balance and coordination.  Psych: Denies anxiety, depression, SI/HI.  No other specific complaints in a complete review of systems (except as listed in HPI above).  Objective:  PE:  BP 125/60 (BP Location: Left Arm, Patient Position: Sitting, Cuff Size: Large)   Pulse 61   Temp 97.7 F (36.5 C) (Temporal)   Resp 18   Ht '5\' 1"'$  (1.549 m)   Wt 179 lb 9.6 oz (81.5 kg)   SpO2 100%   BMI 33.94 kg/m   Wt Readings from Last 3 Encounters:  06/19/21 178 lb 12.8 oz (81.1 kg)  06/05/21 181 lb 1 oz (82.1 kg)  04/16/21 178 lb (80.7 kg)    General: Appears her stated age, obese,  in NAD. Skin: Warm, dry and intact.  HEENT: Head: normal shape and size; Eyes: sclera white and EOMs intact;  Cardiovascular: Normal rate and rhythm. S1,S2 noted.  No murmur, rubs or gallops noted. No JVD or BLE edema. No carotid bruits noted. Pulmonary/Chest: Normal effort and positive vesicular breath sounds. No respiratory distress. No wheezes, rales or ronchi noted.  Abdomen: Soft and nontender. Normal bowel sounds. No distention or masses noted. Liver, spleen and kidneys non palpable. Musculoskeletal: Strength 5/5 BUE/BLE. No signs of joint swelling.  Neurological: Alert and oriented. Cranial nerves II-XII grossly intact. Coordination normal.  Psychiatric: Mood and affect normal. Behavior is normal. Judgment and thought content normal.   BMET    Component Value Date/Time   NA 139 12/05/2020 1439   NA 140 01/10/2020 0950   K 3.7 12/05/2020 1439   CL 104 12/05/2020 1439   CO2 30 12/05/2020 1439   GLUCOSE 86 12/05/2020 1439   BUN 15 12/05/2020 1439   BUN 15 01/10/2020 0950   CREATININE 0.91 12/05/2020 1439   CREATININE 0.99 (H) 05/29/2018 1547   CALCIUM 10.2 12/05/2020 1439   GFRNONAA 64 01/10/2020 0950   GFRAA 74 01/10/2020 0950    Lipid Panel     Component Value Date/Time   CHOL 188 03/22/2020 1139   CHOL 203 (H) 01/10/2020 0950   TRIG 44 03/22/2020 1139   HDL 59 03/22/2020 1139   HDL 63 01/10/2020 0950   CHOLHDL 3.2 03/22/2020 1139   VLDL 9 03/22/2020 1139   LDLCALC 120 (H) 03/22/2020 1139   LDLCALC 129 (H) 01/10/2020 0950   LDLCALC 110 (H) 05/29/2018 1547    CBC    Component Value Date/Time   WBC 6.0 12/05/2020 1439   RBC 4.13 12/05/2020 1439   HGB 13.5 12/05/2020 1439   HGB 13.9 01/10/2020 0950   HCT 38.4 12/05/2020 1439   HCT  40.1 01/10/2020 0950   PLT 202.0 12/05/2020 1439   PLT 227 01/10/2020 0950   MCV 93.1 12/05/2020 1439   MCV 97 01/10/2020 0950   MCH 33.6 (H) 01/10/2020 0950   MCH 33.3 (H) 05/29/2018 1547   MCHC 35.2 12/05/2020 1439   RDW 12.6  12/05/2020 1439   RDW 12.3 01/10/2020 0950   LYMPHSABS 1.0 01/10/2020 0950   EOSABS 0.3 01/10/2020 0950   BASOSABS 0.0 01/10/2020 0950    Hgb A1C Lab Results  Component Value Date   HGBA1C 5.0 12/05/2020      Assessment and Plan:   Medicare Annual Wellness Visit:  Diet: She does eat meat. She consumes more veggies than fruits. She tries to avoid fried foods. She drinks mostly coffee, hot tea and water. Physical activity: Sedentary Depression/mood screen: Negative, PHQ 9 score of 0 Hearing: Intact to whispered voice Visual acuity: Grossly normal, performs annual eye exam ADLs: Capable Fall risk: None Home safety: Good Cognitive evaluation: Intact to orientation, naming, recall and repetition EOL planning: Adv directives, full code/ I agree   Preventative Medicine: Flu, tetanus, pneumovax, prevnar and zostovax UTD. She will think about the shingrix vaccine. Mammogram, bone density UTD. Referral to GI for colonoscopy. She no longer wants to screen for cervical cancer. Encouraged her to consume a balanced diet and exercise regimen. Advised her to see an eye doctor and dentist annually. Will check CBC, CMET, Lipid panel today. Due dates for screening exams given to patient as part of her AVS   Next appointment: 6 months, follow up chronic conditions   Webb Silversmith, NP This visit occurred during the SARS-CoV-2 public health emergency.  Safety protocols were in place, including screening questions prior to the visit, additional usage of staff PPE, and extensive cleaning of exam room while observing appropriate contact time as indicated for disinfecting solutions.

## 2021-07-17 NOTE — Assessment & Plan Note (Signed)
Continue Losartan, HCTZ, and Atenolol as prescribed Encouraged DASH diet and exercise for weight loss CMET today

## 2021-07-17 NOTE — Assessment & Plan Note (Signed)
Continue Advair and Albuterol

## 2021-07-17 NOTE — Assessment & Plan Note (Signed)
Continue Mirtazapine as needed

## 2021-07-18 LAB — COMPLETE METABOLIC PANEL WITH GFR
AG Ratio: 1.5 (calc) (ref 1.0–2.5)
ALT: 15 U/L (ref 6–29)
AST: 17 U/L (ref 10–35)
Albumin: 4 g/dL (ref 3.6–5.1)
Alkaline phosphatase (APISO): 76 U/L (ref 37–153)
BUN: 14 mg/dL (ref 7–25)
CO2: 28 mmol/L (ref 20–32)
Calcium: 10 mg/dL (ref 8.6–10.4)
Chloride: 107 mmol/L (ref 98–110)
Creat: 0.81 mg/dL (ref 0.60–1.00)
Globulin: 2.6 g/dL (calc) (ref 1.9–3.7)
Glucose, Bld: 97 mg/dL (ref 65–99)
Potassium: 4 mmol/L (ref 3.5–5.3)
Sodium: 141 mmol/L (ref 135–146)
Total Bilirubin: 1.1 mg/dL (ref 0.2–1.2)
Total Protein: 6.6 g/dL (ref 6.1–8.1)
eGFR: 74 mL/min/{1.73_m2} (ref >=60)

## 2021-07-18 LAB — CBC
HCT: 38.7 % (ref 35.0–45.0)
Hemoglobin: 13.1 g/dL (ref 11.7–15.5)
MCH: 32.3 pg (ref 27.0–33.0)
MCHC: 33.9 g/dL (ref 32.0–36.0)
MCV: 95.6 fL (ref 80.0–100.0)
MPV: 11.2 fL (ref 7.5–12.5)
Platelets: 210 10*3/uL (ref 140–400)
RBC: 4.05 10*6/uL (ref 3.80–5.10)
RDW: 12.4 % (ref 11.0–15.0)
WBC: 5.2 10*3/uL (ref 3.8–10.8)

## 2021-07-18 LAB — LIPID PANEL
Cholesterol: 187 mg/dL (ref <200)
HDL: 61 mg/dL (ref >=50)
LDL Cholesterol (Calc): 109 mg/dL (calc) — ABNORMAL HIGH
Non-HDL Cholesterol (Calc): 126 mg/dL (calc) (ref <130)
Total CHOL/HDL Ratio: 3.1 (calc) (ref <5.0)
Triglycerides: 78 mg/dL (ref <150)

## 2021-07-19 LAB — CERVICOVAGINAL ANCILLARY ONLY
Bacterial Vaginitis (gardnerella): NEGATIVE
Candida Glabrata: NEGATIVE
Candida Vaginitis: NEGATIVE
Chlamydia: NEGATIVE
Comment: NEGATIVE
Comment: NEGATIVE
Comment: NEGATIVE
Comment: NEGATIVE
Comment: NEGATIVE
Comment: NORMAL
Neisseria Gonorrhea: NEGATIVE
Trichomonas: NEGATIVE

## 2021-07-23 ENCOUNTER — Encounter: Payer: Self-pay | Admitting: *Deleted

## 2021-08-21 ENCOUNTER — Telehealth: Payer: Self-pay

## 2021-08-21 NOTE — Telephone Encounter (Signed)
Returned patients call. No answer. No voicemail available.

## 2021-08-22 ENCOUNTER — Telehealth: Payer: Self-pay

## 2021-08-22 ENCOUNTER — Other Ambulatory Visit (INDEPENDENT_AMBULATORY_CARE_PROVIDER_SITE_OTHER): Payer: Self-pay

## 2021-08-22 DIAGNOSIS — Z86010 Personal history of colonic polyps: Secondary | ICD-10-CM

## 2021-08-22 MED ORDER — PEG 3350-KCL-NA BICARB-NACL 420 G PO SOLR: 4000.0000 mL | Freq: Once | ORAL | 0 refills | Status: AC

## 2021-08-22 NOTE — Telephone Encounter (Signed)
Pt. Calling ready to schedule colonoscopy

## 2021-08-22 NOTE — Progress Notes (Signed)
Gastroenterology Pre-Procedure Review  Request Date: 09/17/2021 Requesting Physician: Dr. Vicente Males  PATIENT REVIEW QUESTIONS: The patient responded to the following health history questions as indicated:    1. Are you having any GI issues? no 2. Do you have a personal history of Polyps? yes (last colonscopy had 9) 3. Do you have a family history of Colon Cancer or Polyps? yes (dad colon cancer) 4. Diabetes Mellitus? no 5. Joint replacements in the past 12 months?yes (left hip in febuary) 6. Major health problems in the past 3 months?no 7. Any artificial heart valves, MVP, or defibrillator?no    MEDICATIONS & ALLERGIES:    Patient reports the following regarding taking any anticoagulation/antiplatelet therapy:   Plavix, Coumadin, Eliquis, Xarelto, Lovenox, Pradaxa, Brilinta, or Effient? no Aspirin? no  Patient confirms/reports the following medications:  Current Outpatient Medications  Medication Sig Dispense Refill   acetaminophen (TYLENOL) 500 MG tablet Take 500 mg by mouth every 8 (eight) hours as needed for moderate pain.     ADVAIR HFA 115-21 MCG/ACT inhaler INHALE 2 PUFFS BY MOUTH INTO THE LUNGS TWICE DAILY AS NEEDED (Patient taking differently: Inhale 1 puff into the lungs daily.) 12 g 2   atenolol (TENORMIN) 25 MG tablet TAKE 1 TABLET TWICE DAILY 180 tablet 3   cholecalciferol (VITAMIN D) 1000 units tablet Take 1,000 Units by mouth every other day. At bedtime.     CREAM BASE EX Apply 1-2 application topically 4 (four) times daily as needed (for arthritis pain.). Penetrex Inflammation Formula (Arnica/Pyridoxine/MSM/Boswellia/Cetyl Myristoleate)     cyclobenzaprine (FLEXERIL) 5 MG tablet Take 2.5 mg by mouth at bedtime.     fluticasone (FLONASE) 50 MCG/ACT nasal spray Place into both nostrils daily.     hydrochlorothiazide (HYDRODIURIL) 25 MG tablet TAKE 1 TABLET EVERY DAY 90 tablet 0   HYDROcodone-acetaminophen (NORCO/VICODIN) 5-325 MG tablet Take 1 tablet by mouth daily as needed  for moderate pain. (Patient taking differently: Take 0.25 tablets by mouth daily as needed for moderate pain.) 15 tablet 0   ibuprofen (ADVIL,MOTRIN) 200 MG tablet Take 200 mg by mouth at bedtime as needed for moderate pain (for arthritis pain.).     Lidocaine 4 % PTCH Apply 1 patch topically daily as needed (pain).     losartan (COZAAR) 50 MG tablet TAKE 1/2 TABLET IN THE MORNING  AND TAKE 1/2 TABLET IN THE EVENING 90 tablet 1   Menthol-Methyl Salicylate (PAIN RELIEVING) CREA Apply 1 application topically 4 (four) times daily as needed (for arthritis pain). EFAC PAIN RELIEVING CREAM     Menthol-Methyl Salicylate (SALONPAS PAIN RELIEF PATCH EX) Apply 1 patch topically daily as needed (pain).     mirtazapine (REMERON SOL-TAB) 15 MG disintegrating tablet DISSOLVE 1 TABLET  ON  TONGUE AT BEDTIME (Patient taking differently: Take 1 mg by mouth at bedtime as needed (insomnia).) 20 tablet 0   OVER THE COUNTER MEDICATION Take 1 tablet by mouth 3 (three) times daily with meals. Cholester Care     triamcinolone cream (KENALOG) 0.1 % Apply 1 application topically 2 (two) times daily. 30 g 0   No current facility-administered medications for this visit.    Patient confirms/reports the following allergies:  Allergies  Allergen Reactions   Avelox [Moxifloxacin Hcl In Nacl] Other (See Comments)    Chills   Trazodone And Nefazodone Swelling   Clarithromycin Other (See Comments)    Unsure of reaction type    No orders of the defined types were placed in this encounter.   AUTHORIZATION INFORMATION Primary  Insurance: 1D#: Group #:  Secondary Insurance: 1D#: Group #:  SCHEDULE INFORMATION: Date: 09/17/2021 Time: Location: [facility]

## 2021-09-07 ENCOUNTER — Encounter: Payer: Self-pay | Admitting: Internal Medicine

## 2021-09-10 ENCOUNTER — Other Ambulatory Visit: Payer: Self-pay

## 2021-09-10 ENCOUNTER — Ambulatory Visit (INDEPENDENT_AMBULATORY_CARE_PROVIDER_SITE_OTHER): Payer: Medicare HMO

## 2021-09-10 DIAGNOSIS — Z23 Encounter for immunization: Secondary | ICD-10-CM | POA: Diagnosis not present

## 2021-09-10 NOTE — Telephone Encounter (Signed)
Pt is saying my location/credentials is not up to date with [employer]. How do we fix this?

## 2021-09-17 ENCOUNTER — Ambulatory Visit: Admission: RE | Admit: 2021-09-17 | Payer: Medicare HMO | Source: Home / Self Care | Admitting: Gastroenterology

## 2021-09-17 ENCOUNTER — Encounter: Admission: RE | Payer: Self-pay | Source: Home / Self Care

## 2021-09-17 SURGERY — COLONOSCOPY WITH PROPOFOL
Anesthesia: General

## 2021-09-18 NOTE — Telephone Encounter (Signed)
Pt. Calling ready to schedule colonoscopy

## 2021-09-20 ENCOUNTER — Telehealth: Payer: Self-pay | Admitting: Gastroenterology

## 2021-09-20 NOTE — Telephone Encounter (Signed)
Pt. Calling to reschedule colonoscopy

## 2021-09-21 ENCOUNTER — Other Ambulatory Visit: Payer: Self-pay

## 2021-09-21 DIAGNOSIS — Z1211 Encounter for screening for malignant neoplasm of colon: Secondary | ICD-10-CM

## 2021-09-21 MED ORDER — PEG 3350-KCL-NA BICARB-NACL 420 G PO SOLR: 4000.0000 mL | Freq: Once | ORAL | 0 refills | Status: AC

## 2021-09-21 NOTE — Progress Notes (Signed)
Rescheduled colonoscopy  

## 2021-10-16 ENCOUNTER — Ambulatory Visit: Admit: 2021-10-16 | Payer: Medicare HMO | Admitting: Gastroenterology

## 2021-10-16 SURGERY — COLONOSCOPY WITH PROPOFOL
Anesthesia: General

## 2021-10-22 ENCOUNTER — Other Ambulatory Visit: Payer: Self-pay | Admitting: Internal Medicine

## 2021-10-22 NOTE — Telephone Encounter (Signed)
Requested medication (s) are due for refill today: Yes  Requested medication (s) are on the active medication list: Yes  Last refill:  05/16/21  Future visit scheduled: No  Notes to clinic:  Unable to refill per protocol, medication not assigned to the refill protocol.      Requested Prescriptions  Pending Prescriptions Disp Refills   hydrochlorothiazide (HYDRODIURIL) 25 MG tablet [Pharmacy Med Name: HYDROCHLOROTHIAZIDE 25 MG Tablet] 90 tablet 0    Sig: TAKE 1 TABLET EVERY DAY     There is no refill protocol information for this order

## 2021-10-22 NOTE — Telephone Encounter (Signed)
Requested medication (s) are due for refill today: Yes  Requested medication (s) are on the active medication list: Yes  Last refill:  05/16/21  Future visit scheduled: no  Notes to clinic:  no protocol attached     Requested Prescriptions  Pending Prescriptions Disp Refills   hydrochlorothiazide (HYDRODIURIL) 25 MG tablet [Pharmacy Med Name: HYDROCHLOROTHIAZIDE 25 MG Tablet] 90 tablet 0    Sig: TAKE 1 TABLET EVERY DAY     There is no refill protocol information for this order

## 2021-10-22 NOTE — Telephone Encounter (Signed)
Requested medication (s) are due for refill today: Yes  Requested medication (s) are on the active medication list: Yes  Last refill:  05/16/2021 #90/0RF  Future visit scheduled: No  Notes to clinic:  no protocol attached.      Requested Prescriptions  Pending Prescriptions Disp Refills   hydrochlorothiazide (HYDRODIURIL) 25 MG tablet [Pharmacy Med Name: HYDROCHLOROTHIAZIDE 25 MG Tablet] 90 tablet 0    Sig: TAKE 1 TABLET EVERY DAY     There is no refill protocol information for this order

## 2021-11-12 DIAGNOSIS — H903 Sensorineural hearing loss, bilateral: Secondary | ICD-10-CM | POA: Diagnosis not present

## 2021-11-20 ENCOUNTER — Ambulatory Visit (INDEPENDENT_AMBULATORY_CARE_PROVIDER_SITE_OTHER): Payer: Medicare HMO | Admitting: Gastroenterology

## 2021-11-20 ENCOUNTER — Encounter: Payer: Self-pay | Admitting: Gastroenterology

## 2021-11-20 VITALS — BP 173/82 | HR 62 | Temp 98.2°F | Ht 61.0 in | Wt 187.2 lb

## 2021-11-20 DIAGNOSIS — D126 Benign neoplasm of colon, unspecified: Secondary | ICD-10-CM

## 2021-11-20 DIAGNOSIS — Z86010 Personal history of colonic polyps: Secondary | ICD-10-CM | POA: Diagnosis not present

## 2021-11-20 MED ORDER — NA SULFATE-K SULFATE-MG SULF 17.5-3.13-1.6 GM/177ML PO SOLN: 1.0000 | Freq: Once | ORAL | 0 refills | Status: AC

## 2021-11-20 NOTE — Progress Notes (Signed)
Cephas Darby, MD [address]  [place]  [place], [place] [address]  Main: [phone]  Fax: [phone]    Gastroenterology Consultation  Referring Provider:     Jearld Fenton, NP Primary Care Physician:  Jearld Fenton, NP Primary Gastroenterologist:  Dr. Cephas Darby Reason for Consultation:     Personal history of colon polyps        HPI:   Molly Faulkner is a 80 y.o. female referred by Dr. Jearld Fenton, NP  for consultation & management of personal history of colon polyps.  Patient had a colonoscopy in 2019 which revealed subcentimeter adenomatous polyps and she is due for surveillance colonoscopy.  Because of her age, patient is referred to be seen in the GI office prior to scheduling colonoscopy.  Patient has gained about 8 pounds in last 4 months and her blood pressure is elevated today.  She is on 3 different blood pressure medications.  She is not on a low-sodium diet  She is a retired Equities trader.  She does not smoke or drink alcohol.  She is otherwise healthy.  Patient denies any GI symptoms  NSAIDs: None  Antiplts/Anticoagulants/Anti thrombotics: None  GI Procedures:  Colonoscopy 06/30/2018 - Diverticulosis in the entire examined colon. - Three 3 to 4 mm polyps in the sigmoid colon, in the transverse colon and in the cecum, removed with a cold biopsy forceps. Resected and retrieved. - Three 3 to 4 mm polyps in the ascending colon, removed with a cold biopsy forceps. Resected and retrieved. - Two 3 to 4 mm polyps in the descending colon, removed with a cold biopsy forceps. Resected and retrieved. - One 5 mm polyp in the sigmoid colon, removed with a cold snare. Resected and retrieved. - The examination was otherwise normal on direct and retroflexion views. DIAGNOSIS:  A.  COLON POLYP, CECUM; COLD BIOPSY:  - TUBULAR ADENOMA.  - NEGATIVE FOR HIGH-GRADE DYSPLASIA AND MALIGNANCY.   B.  COLON POLYP X 3, ASCENDING; COLD BIOPSY:  - TUBULAR  ADENOMA, 1 FRAGMENT, NEGATIVE FOR HIGH-GRADE DYSPLASIA AND  MALIGNANCY.  - UNREMARKABLE MUCOSA, 2 FRAGMENTS.   C.  COLON POLYP, TRANSVERSE; COLD BIOPSY:  - TUBULAR ADENOMA.  - NEGATIVE FOR HIGH-GRADE DYSPLASIA AND MALIGNANCY.   D.  COLON POLYP X 2, DESCENDING; COLD BIOPSY:  - HYPERPLASTIC POLYP, 1 FRAGMENT, NEGATIVE FOR DYSPLASIA AND MALIGNANCY.  - LYMPHOID AGGREGATE IN 1 FRAGMENT.   E.  COLON POLYP X 2, SIGMOID; COLD SNARE AND COLD BIOPSY:  - HYPERPLASTIC POLYPS, 4 FRAGMENTS.  - NEGATIVE FOR DYSPLASIA AND MALIGNANCY.   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   Sleep apnea    H/O   NO LONGER   Syncope     Past Surgical History:  Procedure Laterality Date   ABDOMINAL HYSTERECTOMY  1994   total   BREAST BIOPSY Bilateral 3646,8032   BREAST EXCISIONAL BIOPSY     CARPAL TUNNEL RELEASE Bilateral  CATARACT EXTRACTION W/PHACO Right 12/02/2017   Procedure: CATARACT EXTRACTION PHACO AND INTRAOCULAR LENS PLACEMENT (IOC);  Surgeon: Birder Robson, MD;  Location: [facility] ORS;  Service: Ophthalmology;  Laterality: Right;  [place] 00:36 AP% 11.5 CDE 4.21 Fluid pack lot # [id number]   CATARACT EXTRACTION W/PHACO Left 12/30/2017   Procedure: CATARACT EXTRACTION PHACO AND INTRAOCULAR LENS PLACEMENT (IOC);  Surgeon: Birder Robson, MD;  Location: [facility] ORS;  Service: Ophthalmology;  Laterality: Left;  [place] 00:58 AP% 11.6 CDE 6.80 Fluid pack lot # [id number]   COLONOSCOPY WITH PROPOFOL N/A 06/30/2018   Procedure: COLONOSCOPY WITH PROPOFOL;  Surgeon: Jonathon Bellows, MD;  Location: [facility] ENDOSCOPY;  Service:  Gastroenterology;  Laterality: N/A;   DILATION AND CURETTAGE OF UTERUS     X 2   KNEE ARTHROSCOPY     Current Outpatient Medications:    acetaminophen (TYLENOL) 500 MG tablet, Take 500 mg by mouth every 8 (eight) hours as needed for moderate pain., Disp: , Rfl:    ADVAIR HFA 115-21 MCG/ACT inhaler, INHALE 2 PUFFS BY MOUTH INTO THE LUNGS TWICE DAILY AS NEEDED (Patient taking differently: Inhale 1 puff into the lungs daily.), Disp: 12 g, Rfl: 2   atenolol (TENORMIN) 25 MG tablet, TAKE 1 TABLET TWICE DAILY, Disp: 180 tablet, Rfl: 3   cholecalciferol (VITAMIN D) 1000 units tablet, Take 1,000 Units by mouth every other day. At bedtime., Disp: , Rfl:    CREAM BASE EX, Apply 1-2 application topically 4 (four) times daily as needed (for arthritis pain.). Penetrex Inflammation Formula (Arnica/Pyridoxine/MSM/Boswellia/Cetyl Myristoleate), Disp: , Rfl:    cyclobenzaprine (FLEXERIL) 5 MG tablet, Take 2.5 mg by mouth at bedtime., Disp: , Rfl:    fluticasone (FLONASE) 50 MCG/ACT nasal spray, Place into both nostrils daily., Disp: , Rfl:    hydrochlorothiazide (HYDRODIURIL) 25 MG tablet, TAKE 1 TABLET EVERY DAY, Disp: 90 tablet, Rfl: 0   HYDROcodone-acetaminophen (NORCO/VICODIN) 5-325 MG tablet, Take 1 tablet by mouth daily as needed for moderate pain. (Patient taking differently: Take 0.25 tablets by mouth daily as needed for moderate pain.), Disp: 15 tablet, Rfl: 0   ibuprofen (ADVIL,MOTRIN) 200 MG tablet, Take 200 mg by mouth at bedtime as needed for moderate pain (for arthritis pain.)., Disp: , Rfl:    Lidocaine 4 % PTCH, Apply 1 patch topically daily as needed (pain)., Disp: , Rfl:    losartan (COZAAR) 50 MG tablet, TAKE 1/2 TABLET IN THE MORNING  AND TAKE 1/2 TABLET IN THE EVENING, Disp: 90 tablet, Rfl: 1   Menthol-Methyl Salicylate (PAIN RELIEVING) CREA, Apply 1 application topically 4 (four) times daily as needed (for arthritis pain). EFAC PAIN RELIEVING CREAM, Disp: , Rfl:    Menthol-Methyl Salicylate  (SALONPAS PAIN RELIEF PATCH EX), Apply 1 patch topically daily as needed (pain)., Disp: , Rfl:    mirtazapine (REMERON SOL-TAB) 15 MG disintegrating tablet, DISSOLVE 1 TABLET  ON  TONGUE AT BEDTIME (Patient taking differently: Take 1 mg by mouth at bedtime as needed (insomnia).), Disp: 20 tablet, Rfl: 0   Na Sulfate-K Sulfate-Mg Sulf 17.5-3.13-1.6 GM/177ML SOLN, Take 1 kit by mouth once for 1 dose., Disp: 354 mL, Rfl: 0    Family History  Problem Relation Age of Onset   Breast cancer Mother 17   Arthritis Maternal Grandmother    Diabetes Maternal Grandmother      Social History   Tobacco Use   Smoking status: Former    Packs/day: 0.50    Years: 7.00    Pack years: 3.50    Types: Cigarettes  Smokeless tobacco: Never   Tobacco comments:    quit over 40 years ago  Vaping Use   Vaping Use: Never used  Substance Use Topics   Alcohol use: Yes    Alcohol/week: 4.0 standard drinks    Types: 4 Glasses of wine per week   Drug use: No    Allergies as of 11/20/2021 - Review Complete 11/20/2021  Allergen Reaction Noted   Avelox [moxifloxacin hcl in nacl] Other (See Comments) 07/25/2014   Trazodone and nefazodone Swelling 07/25/2014   Clarithromycin Other (See Comments) 09/18/2015    Review of Systems:    All systems reviewed and negative except where noted in HPI.   Physical Exam:  BP (!) 173/82 (BP Location: Left Arm, Patient Position: Sitting, Cuff Size: Normal)   Pulse 62   Temp 98.2 F (36.8 C) (Oral)   Ht _0  (1.549 m)   Wt 187 lb 3.2 oz (84.9 kg)   BMI 35.37 kg/m  No LMP recorded. Patient has had a hysterectomy.  General:   Alert,  Well-developed, well-nourished, pleasant and cooperative in NAD Head:  Normocephalic and atraumatic. Eyes:  Sclera clear, no icterus.   Conjunctiva pink. Ears:  Normal auditory acuity. Nose:  No deformity, discharge, or lesions. Mouth:  No deformity or lesions,oropharynx pink & moist. Neck:  Supple; no masses or thyromegaly. Lungs:   Respirations even and unlabored.  Clear throughout to auscultation.   No wheezes, crackles, or rhonchi. No acute distress. Heart:  Regular rate and rhythm; no murmurs, clicks, rubs, or gallops. Abdomen:  Normal bowel sounds. Soft, non-tender and non-distended without masses, hepatosplenomegaly or hernias noted.  No guarding or rebound tenderness.   Rectal: Not performed Msk:  Symmetrical without gross deformities. Good, equal movement & strength bilaterally. Pulses:  Normal pulses noted. Extremities:  No clubbing or edema.  No cyanosis. Neurologic:  Alert and oriented x3;  grossly normal neurologically. Skin:  Intact without significant lesions or rashes. No jaundice. Psych:  Alert and cooperative. Normal mood and affect.  Imaging Studies: No abdominal imaging  Assessment and Plan:   Molly Faulkner is a 80 y.o. female with history of hypertension is seen in consultation for personal history of adenomas of the colon.  Patient is otherwise healthy.  Therefore, it is appropriate to proceed with surveillance colonoscopy  Advised patient to follow strict low-sodium diet and closely monitor her blood pressures at home.  If persistently elevated, she said she will reach out to her PCP/cardiologist   Follow up as needed  Cephas Darby, MD

## 2021-11-25 ENCOUNTER — Other Ambulatory Visit: Payer: Self-pay | Admitting: Cardiovascular Disease

## 2022-01-04 ENCOUNTER — Encounter: Payer: Self-pay | Admitting: Gastroenterology

## 2022-01-07 ENCOUNTER — Ambulatory Visit: Payer: Medicare HMO | Admitting: Anesthesiology

## 2022-01-07 ENCOUNTER — Ambulatory Visit
Admission: RE | Admit: 2022-01-07 | Discharge: 2022-01-07 | Disposition: A | Discharge status: Home / Self Care | Payer: Medicare HMO | Attending: Gastroenterology | Admitting: Gastroenterology

## 2022-01-07 ENCOUNTER — Encounter
Admission: RE | Disposition: A | Discharge status: Home / Self Care | Payer: Self-pay | Source: Home / Self Care | Attending: Gastroenterology

## 2022-01-07 DIAGNOSIS — Z87891 Personal history of nicotine dependence: Secondary | ICD-10-CM | POA: Diagnosis not present

## 2022-01-07 DIAGNOSIS — Z09 Encounter for follow-up examination after completed treatment for conditions other than malignant neoplasm: Secondary | ICD-10-CM | POA: Diagnosis not present

## 2022-01-07 DIAGNOSIS — K635 Polyp of colon: Secondary | ICD-10-CM | POA: Diagnosis not present

## 2022-01-07 DIAGNOSIS — E669 Obesity, unspecified: Secondary | ICD-10-CM | POA: Diagnosis not present

## 2022-01-07 DIAGNOSIS — K5730 Diverticulosis of large intestine without perforation or abscess without bleeding: Secondary | ICD-10-CM | POA: Insufficient documentation

## 2022-01-07 DIAGNOSIS — Z6834 Body mass index (BMI) 34.0-34.9, adult: Secondary | ICD-10-CM | POA: Diagnosis not present

## 2022-01-07 DIAGNOSIS — I10 Essential (primary) hypertension: Secondary | ICD-10-CM | POA: Insufficient documentation

## 2022-01-07 DIAGNOSIS — Z860100 Personal history of colon polyps, unspecified: Secondary | ICD-10-CM

## 2022-01-07 DIAGNOSIS — K648 Other hemorrhoids: Secondary | ICD-10-CM | POA: Diagnosis not present

## 2022-01-07 DIAGNOSIS — J45909 Unspecified asthma, uncomplicated: Secondary | ICD-10-CM | POA: Insufficient documentation

## 2022-01-07 DIAGNOSIS — K644 Residual hemorrhoidal skin tags: Secondary | ICD-10-CM | POA: Insufficient documentation

## 2022-01-07 DIAGNOSIS — Z1211 Encounter for screening for malignant neoplasm of colon: Secondary | ICD-10-CM | POA: Diagnosis not present

## 2022-01-07 DIAGNOSIS — K5790 Diverticulosis of intestine, part unspecified, without perforation or abscess without bleeding: Secondary | ICD-10-CM | POA: Diagnosis not present

## 2022-01-07 DIAGNOSIS — E119 Type 2 diabetes mellitus without complications: Secondary | ICD-10-CM | POA: Insufficient documentation

## 2022-01-07 HISTORY — PX: COLONOSCOPY WITH PROPOFOL: SHX5780

## 2022-01-07 SURGERY — COLONOSCOPY WITH PROPOFOL
Anesthesia: General

## 2022-01-07 MED ORDER — PROPOFOL 10 MG/ML IV BOLUS
INTRAVENOUS | Status: DC | PRN
Start: 2022-01-07 — End: 2022-01-07
  Administered 2022-01-07: 80 mg via INTRAVENOUS
  Administered 2022-01-07 (×2): 10 mg via INTRAVENOUS

## 2022-01-07 MED ORDER — PROPOFOL 500 MG/50ML IV EMUL
INTRAVENOUS | Status: AC
  Filled 2022-01-07: qty 50

## 2022-01-07 MED ORDER — PROPOFOL 500 MG/50ML IV EMUL
INTRAVENOUS | Status: DC | PRN
  Administered 2022-01-07: 150 ug/kg/min via INTRAVENOUS

## 2022-01-07 MED ORDER — LIDOCAINE HCL (PF) 2 % IJ SOLN
INTRAMUSCULAR | Status: AC
  Filled 2022-01-07: qty 5

## 2022-01-07 MED ORDER — LIDOCAINE HCL (CARDIAC) PF 100 MG/5ML IV SOSY
PREFILLED_SYRINGE | INTRAVENOUS | Status: DC | PRN
  Administered 2022-01-07: 40 mg via INTRAVENOUS

## 2022-01-07 MED ORDER — SODIUM CHLORIDE 0.9 % IV SOLN: INTRAVENOUS | Status: DC

## 2022-01-07 NOTE — Anesthesia Procedure Notes (Signed)
Date/Time: 01/07/2022 7:43 AM Performed by: Doreen Salvage, CRNA Pre-anesthesia Checklist: Patient identified, Emergency Drugs available, Suction available and Patient being monitored Patient Re-evaluated:Patient Re-evaluated prior to induction Oxygen Delivery Method: Nasal cannula Induction Type: IV induction Dental Injury: Teeth and Oropharynx as per pre-operative assessment  Comments: Nasal cannula with etCO2 monitoring

## 2022-01-07 NOTE — Anesthesia Postprocedure Evaluation (Signed)
Anesthesia Post Note  Patient: Molly Faulkner  Procedure(s) Performed: COLONOSCOPY WITH PROPOFOL  Patient location during evaluation: PACU Anesthesia Type: General Level of consciousness: awake and alert, oriented and patient cooperative Pain management: pain level controlled Vital Signs Assessment: post-procedure vital signs reviewed and stable Respiratory status: spontaneous breathing, nonlabored ventilation and respiratory function stable Cardiovascular status: blood pressure returned to baseline and stable Postop Assessment: adequate PO intake Anesthetic complications: no   No notable events documented.   Last Vitals:  Vitals:   01/07/22 0817 01/07/22 0827  BP: 128/65 124/78  Pulse: 81 75  Resp: (!) 28 10  Temp:    SpO2: 100% 100%    Last Pain:  Vitals:   01/07/22 0827  TempSrc:   PainSc: 0-No pain                 Darrin Nipper

## 2022-01-07 NOTE — H&P (Signed)
Cephas Darby, MD [address]  [place]  [place],  [address]  Main: [phone]  Fax: [phone] Pager: [phone]  Primary Care Physician:  Jearld Fenton, NP Primary Gastroenterologist:  Dr. Cephas Darby  Pre-Procedure History & Physical: HPI:  Molly Faulkner is a 81 y.o. female is here for an colonoscopy.   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   Sleep apnea    H/O   NO LONGER   Syncope     Past Surgical History:  Procedure Laterality Date   ABDOMINAL HYSTERECTOMY  1994   total   BREAST BIOPSY Bilateral 8315,1761   BREAST EXCISIONAL BIOPSY     CARPAL TUNNEL RELEASE Bilateral    CATARACT EXTRACTION W/PHACO Right 12/02/2017   Procedure: CATARACT EXTRACTION PHACO AND INTRAOCULAR LENS PLACEMENT ([place]);  Surgeon: Birder Robson, MD;  Location: [facility] ORS;  Service: Ophthalmology;  Laterality: Right;  [place] 00:36 AP% 11.5 CDE 4.21 Fluid pack lot # [id number]   CATARACT EXTRACTION W/PHACO Left 12/30/2017   Procedure: CATARACT EXTRACTION PHACO AND INTRAOCULAR LENS PLACEMENT (IOC);  Surgeon: Birder Robson, MD;  Location: [facility] ORS;  Service: Ophthalmology;  Laterality: Left;  [place] 00:58 AP% 11.6 CDE 6.80 Fluid pack lot # [id number] H   COLONOSCOPY WITH PROPOFOL N/A 06/30/2018   Procedure: COLONOSCOPY WITH PROPOFOL;  Surgeon: Jonathon Bellows, MD;  Location[facility] ENDOSCOPY;  Service: Gastroenterology;  Laterality: N/A;   DILATION AND CURETTAGE OF UTERUS     X 2   KNEE ARTHROSCOPY      Prior to Admission medications   Medication Sig Start Date End Date Taking? Authorizing  Provider  ADVAIR HFA 115-21 MCG/ACT inhaler INHALE 2 PUFFS BY MOUTH INTO THE LUNGS TWICE DAILY AS NEEDED Patient taking differently: Inhale 1 puff into the lungs daily. 03/19/21  Yes Kasa, Maretta Bees, MD  atenolol (TENORMIN) 25 MG tablet TAKE 1 TABLET TWICE DAILY 04/13/21  Yes Gollan, Kathlene November, MD  hydrochlorothiazide (HYDRODIURIL) 25 MG tablet TAKE 1 TABLET EVERY DAY 10/23/21  Yes Baity, Coralie Keens, NP  losartan (COZAAR) 50 MG tablet TAKE 1/2 TABLET IN THE MORNING  AND TAKE 1/2 TABLET IN THE EVENING 11/26/21  Yes Gollan, Kathlene November, MD  acetaminophen (TYLENOL) 500 MG tablet Take 500 mg by mouth every 8 (eight) hours as needed for moderate pain.    [provider]  cholecalciferol (VITAMIN D) 1000 units tablet Take 1,000 Units by mouth every other day. At bedtime.    [provider]  CREAM BASE EX Apply 1-2 application topically 4 (four) times daily as needed (for arthritis pain.). Penetrex Inflammation Formula (Arnica/Pyridoxine/MSM/Boswellia/Cetyl Myristoleate)    [provider]  cyclobenzaprine (FLEXERIL) 5 MG tablet Take 2.5 mg by mouth at bedtime.    [provider]  fluticasone (FLONASE) 50 MCG/ACT nasal spray Place into both nostrils daily.    [provider]  HYDROcodone-acetaminophen (NORCO/VICODIN) 5-325 MG tablet Take 1 tablet by mouth daily as needed for moderate pain. Patient taking differently:  Take 0.25 tablets by mouth daily as needed for moderate pain. 11/21/20   Jearld Fenton, NP  ibuprofen (ADVIL,MOTRIN) 200 MG tablet Take 200 mg by mouth at bedtime as needed for moderate pain (for arthritis pain.).    [provider]  Lidocaine 4 % PTCH Apply 1 patch topically daily as needed (pain).    [provider]  Menthol-Methyl Salicylate (PAIN RELIEVING) CREA Apply 1 application topically 4 (four) times daily as needed (for arthritis pain). [facility] PAIN RELIEVING CREAM    [provider]  Menthol-Methyl Salicylate (SALONPAS PAIN  RELIEF PATCH EX) Apply 1 patch topically daily as needed (pain).    [provider]  mirtazapine (REMERON SOL-TAB) 15 MG disintegrating tablet DISSOLVE 1 TABLET  ON  TONGUE AT BEDTIME Patient taking differently: Take 1 mg by mouth at bedtime as needed (insomnia). 10/04/20   Jearld Fenton, NP    Allergies as of 11/20/2021 - Review Complete 11/20/2021  Allergen Reaction Noted   Avelox [moxifloxacin hcl in nacl] Other (See Comments) 07/25/2014   Trazodone and nefazodone Swelling 07/25/2014   Clarithromycin Other (See Comments) 09/18/2015    Family History  Problem Relation Age of Onset   Breast cancer Mother 61   Arthritis Maternal Grandmother    Diabetes Maternal Grandmother     Social History   Socioeconomic History   Marital status: Single    Spouse name: Not on file   Number of children: Not on file   Years of education: Not on file   Highest education level: Not on file  Occupational History   Not on file  Tobacco Use   Smoking status: Former    Packs/day: 0.50    Years: 7.00    Pack years: 3.50    Types: Cigarettes   Smokeless tobacco: Never   Tobacco comments:    quit over 40 years ago  Vaping Use   Vaping Use: Never used  Substance and Sexual Activity   Alcohol use: Yes    Alcohol/week: 4.0 standard drinks    Types: 4 Glasses of wine per week   Drug use: No   Sexual activity: Not Currently  Other Topics Concern   Not on file  Social History Narrative   Not on file   Social Determinants of Health   Financial Resource Strain: Not on file  Food Insecurity: Not on file  Transportation Needs: Not on file  Physical Activity: Not on file  Stress: Not on file  Social Connections: Not on file  Intimate Partner Violence: Not on file    Review of Systems: See HPI, otherwise negative ROS  Physical Exam: BP (!) 172/91    Pulse 92    Temp (!) 97.3 F (36.3 C) (Temporal)    Resp 18    Ht 5\' 1"  (1.549 m)    Wt 82.6 kg    SpO2 98%    BMI 34.39 kg/m   General:   Alert,  pleasant and cooperative in NAD Head:  Normocephalic and atraumatic. Neck:  Supple; no masses or thyromegaly. Lungs:  Clear throughout to auscultation.    Heart:  Regular rate and rhythm. Abdomen:  Soft, nontender and nondistended. Normal bowel sounds, without guarding, and without rebound.   Neurologic:  Alert and  oriented x4;  grossly normal neurologically.  Impression/Plan: Molly Faulkner is here for an colonoscopy to be performed for personal h/o colon adenomas  Risks, benefits, limitations, and alternatives regarding  colonoscopy have been reviewed with the patient.  Questions  have been answered.  All parties agreeable.   Sherri Sear, MD  01/07/2022, 7:40 AM

## 2022-01-07 NOTE — Op Note (Signed)
[facility] Gastroenterology Patient Name: Jernee Murtaugh Procedure Date: 01/07/2022 7:41 AM MRN: [id number] Account #: [id number] Date of Birth: February 10, 1941 Admit Type: Outpatient Age: 81 Room: [facility] ENDO ROOM 1 Gender: Female Note Status: Finalized Instrument Name: Jasper Riling [id number] Procedure:             Colonoscopy Indications:           Surveillance: Personal history of adenomatous polyps                         on last colonoscopy > 3 years ago, Last colonoscopy:                         July 2019 Providers:             Lin Landsman MD, MD Referring MD:          Jearld Fenton (Referring MD) Medicines:             General Anesthesia Complications:         No immediate complications. Estimated blood loss: None. Procedure:             Pre-Anesthesia Assessment:                        - Prior to the procedure, a History and Physical was                         performed, and patient medications and allergies were                         reviewed. The patient is competent. The risks and                         benefits of the procedure and the sedation options and                         risks were discussed with the patient. All questions                         were answered and informed consent was obtained.                         Patient identification and proposed procedure were                         verified by the physician, the nurse, the                         anesthesiologist, the anesthetist and the technician                         in the pre-procedure area in the procedure room in the                         endoscopy suite. Mental Status Examination: alert and                         oriented. Airway Examination: normal oropharyngeal  airway and neck mobility. Respiratory Examination:                         clear to auscultation. CV Examination: normal.                         Prophylactic Antibiotics: The patient  does not require                         prophylactic antibiotics. Prior Anticoagulants: The                         patient has taken no previous anticoagulant or                         antiplatelet agents. ASA Grade Assessment: II - A                         patient with mild systemic disease. After reviewing                         the risks and benefits, the patient was deemed in                         satisfactory condition to undergo the procedure. The                         anesthesia plan was to use general anesthesia.                         Immediately prior to administration of medications,                         the patient was re-assessed for adequacy to receive                         sedatives. The heart rate, respiratory rate, oxygen                         saturations, blood pressure, adequacy of pulmonary                         ventilation, and response to care were monitored                         throughout the procedure. The physical status of the                         patient was re-assessed after the procedure.                        After obtaining informed consent, the colonoscope was                         passed under direct vision. Throughout the procedure,                         the patient's blood pressure, pulse, and oxygen  saturations were monitored continuously. The                         Colonoscope was introduced through the anus and                         advanced to the the cecum, identified by appendiceal                         orifice and ileocecal valve. The colonoscopy was                         performed without difficulty. The patient tolerated                         the procedure well. The quality of the bowel                         preparation was evaluated using the BBPS [facility] Bowel                         Preparation Scale) with scores of: Right Colon = 3,                         Transverse Colon = 3  and Left Colon = 3 (entire mucosa                         seen well with no residual staining, small fragments                         of stool or opaque liquid). The total BBPS score                         equals 9. Findings:      The perianal and digital rectal examinations were normal. Pertinent       negatives include normal sphincter tone and no palpable rectal lesions.      A diminutive polypoid lesion was found in the descending colon. The       lesion was sessile. No bleeding was present. Biopsies were taken with a       cold forceps for histology.      Multiple diverticula were found in the entire colon. There was no       evidence of diverticular bleeding.      Non-bleeding external hemorrhoids were found during retroflexion. The       hemorrhoids were medium-sized. Impression:            - Benign polypoid lesion in the descending colon.                         Biopsied.                        - Severe diverticulosis in the entire examined colon.                         There was no evidence of diverticular bleeding.                        -  Non-bleeding external hemorrhoids. Recommendation:        - Discharge patient to home (with escort).                        - Resume previous diet today.                        - Continue present medications.                        - Await pathology results. Procedure Code(s):     --- Professional ---                        [id number], Colonoscopy, flexible; with biopsy, single or                         multiple Diagnosis Code(s):     --- Professional ---                        Z86.010, Personal history of colonic polyps                        D12.4, Benign neoplasm of descending colon                        K64.4, Residual hemorrhoidal skin tags                        K57.30, Diverticulosis of large intestine without                         perforation or abscess without bleeding CPT copyright 2019 American Medical Association. All rights  reserved. The codes documented in this report are preliminary and upon coder review may  be revised to meet current compliance requirements. Dr. Ulyess Mort Lin Landsman MD, MD 01/07/2022 8:05:55 AM This report has been signed electronically. Number of Addenda: 0 Note Initiated On: 01/07/2022 7:41 AM Scope Withdrawal Time: 0 hours 10 minutes 28 seconds  Total Procedure Duration: 0 hours 14 minutes 59 seconds  Estimated Blood Loss:  Estimated blood loss: none.      [facility]

## 2022-01-07 NOTE — Transfer of Care (Signed)
Immediate Anesthesia Transfer of Care Note  Patient: Molly Faulkner  Procedure(s) Performed: Procedure(s): COLONOSCOPY WITH PROPOFOL (N/A)  Patient Location: PACU and Endoscopy Unit  Anesthesia Type:General  Level of Consciousness: sedated  Airway & Oxygen Therapy: Patient Spontanous Breathing and Patient connected to nasal cannula oxygen  Post-op Assessment: Report given to RN and Post -op Vital signs reviewed and stable  Post vital signs: Reviewed and stable  Last Vitals:  Vitals:   01/07/22 0710 01/07/22 0807  BP: (!) 172/91 103/60  Pulse: 92 80  Resp: 18 18  Temp: (!) 36.3 C (!) 36.2 C  SpO2: 91% 50%    Complications: No apparent anesthesia complications

## 2022-01-07 NOTE — Anesthesia Preprocedure Evaluation (Addendum)
Anesthesia Evaluation  Patient identified by MRN, date of birth, ID band Patient awake    Reviewed: Allergy & Precautions, NPO status , Patient's Chart, lab work & pertinent test results  History of Anesthesia Complications Negative for: history of anesthetic complications  Airway Mallampati: I   Neck ROM: Full    Dental  (+)    Pulmonary asthma , former smoker (quit age 81),    Pulmonary exam normal breath sounds clear to auscultation       Cardiovascular hypertension, Normal cardiovascular exam Rhythm:Regular Rate:Normal  ECG 01/22/21: NSR with first degree AVB   Neuro/Psych PSYCHIATRIC DISORDERS Depression negative neurological ROS     GI/Hepatic GERD  ,  Endo/Other  diabetes, Type 2Obesity   Renal/GU negative Renal ROS     Musculoskeletal  (+) Arthritis ,   Abdominal   Peds  Hematology negative hematology ROS (+)   Anesthesia Other Findings Reviewed 01/22/21 cardiology note.  Reproductive/Obstetrics                            Anesthesia Physical Anesthesia Plan  ASA: 2  Anesthesia Plan: General   Post-op Pain Management:    Induction: Intravenous  PONV Risk Score and Plan: 3 and Propofol infusion, TIVA and Treatment may vary due to age or medical condition  Airway Management Planned: Natural Airway  Additional Equipment:   Intra-op Plan:   Post-operative Plan:   Informed Consent: I have reviewed the patients History and Physical, chart, labs and discussed the procedure including the risks, benefits and alternatives for the proposed anesthesia with the patient or authorized representative who has indicated his/her understanding and acceptance.       Plan Discussed with: CRNA  Anesthesia Plan Comments: (LMA/GETA backup discussed.  Patient consented for risks of anesthesia including but not limited to:  - adverse reactions to medications - damage to eyes, teeth, lips or  other oral mucosa - nerve damage due to positioning  - sore throat or hoarseness - damage to heart, brain, nerves, lungs, other parts of body or loss of life  Informed patient about role of CRNA in peri- and intra-operative care.  Patient voiced understanding.)        Anesthesia Quick Evaluation

## 2022-01-08 ENCOUNTER — Encounter: Payer: Self-pay | Admitting: Gastroenterology

## 2022-01-08 LAB — SURGICAL PATHOLOGY

## 2022-01-22 ENCOUNTER — Other Ambulatory Visit: Payer: Self-pay

## 2022-01-22 ENCOUNTER — Encounter: Payer: Self-pay | Admitting: Internal Medicine

## 2022-01-22 ENCOUNTER — Ambulatory Visit (INDEPENDENT_AMBULATORY_CARE_PROVIDER_SITE_OTHER): Payer: Medicare HMO | Admitting: Internal Medicine

## 2022-01-22 VITALS — BP 132/53 | HR 61 | Temp 97.3°F | Wt 183.0 lb

## 2022-01-22 DIAGNOSIS — J4540 Moderate persistent asthma, uncomplicated: Secondary | ICD-10-CM | POA: Diagnosis not present

## 2022-01-22 DIAGNOSIS — R2689 Other abnormalities of gait and mobility: Secondary | ICD-10-CM

## 2022-01-22 DIAGNOSIS — G8929 Other chronic pain: Secondary | ICD-10-CM

## 2022-01-22 DIAGNOSIS — M5441 Lumbago with sciatica, right side: Secondary | ICD-10-CM | POA: Diagnosis not present

## 2022-01-22 DIAGNOSIS — I10 Essential (primary) hypertension: Secondary | ICD-10-CM | POA: Diagnosis not present

## 2022-01-22 DIAGNOSIS — D519 Vitamin B12 deficiency anemia, unspecified: Secondary | ICD-10-CM | POA: Diagnosis not present

## 2022-01-22 DIAGNOSIS — E559 Vitamin D deficiency, unspecified: Secondary | ICD-10-CM | POA: Diagnosis not present

## 2022-01-22 DIAGNOSIS — M5442 Lumbago with sciatica, left side: Secondary | ICD-10-CM

## 2022-01-22 DIAGNOSIS — G4733 Obstructive sleep apnea (adult) (pediatric): Secondary | ICD-10-CM | POA: Diagnosis not present

## 2022-01-22 DIAGNOSIS — E785 Hyperlipidemia, unspecified: Secondary | ICD-10-CM | POA: Diagnosis not present

## 2022-01-22 DIAGNOSIS — Z9889 Other specified postprocedural states: Secondary | ICD-10-CM

## 2022-01-22 DIAGNOSIS — G63 Polyneuropathy in diseases classified elsewhere: Secondary | ICD-10-CM | POA: Diagnosis not present

## 2022-01-22 DIAGNOSIS — R739 Hyperglycemia, unspecified: Secondary | ICD-10-CM | POA: Diagnosis not present

## 2022-01-22 DIAGNOSIS — M545 Low back pain, unspecified: Secondary | ICD-10-CM | POA: Diagnosis not present

## 2022-01-22 DIAGNOSIS — K219 Gastro-esophageal reflux disease without esophagitis: Secondary | ICD-10-CM | POA: Diagnosis not present

## 2022-01-22 NOTE — Progress Notes (Signed)
Subjective:    Patient ID: Molly Faulkner, female    DOB: 1941/04/14, 81 y.o.   MRN: [id number]  HPI  Patient presents to clinic today with complaint of neuropathic pain in her hands and feet, worse in the left foot. She reports the pain is intermittent. She describes the pain as numbness, tingling and burning (burning sensation is only in her hands). She denies chronic neck pain but does have chronic back pain.  MRI lumbar spine from 09/2019 showed:  IMPRESSION: 1. Lumbar spondylosis and degenerative disc disease, causing potential prominent impingement at T11-12 (although not fully characterized) as well as moderate impingement at L3-4; mild to moderate impingement at L5-S1; and mild impingement at L1-2 and L2-3, as detailed above. 2. Thin lipoma of the filum terminale.  She takes Tylenol and Ibuprofen OTC with minimal relief of symptoms. She has had a bilateral carpal tunnel release. She does not have diabetes.  She does feel like the neuropathy is causing poor balance.  She would like referral to PT for further evaluation of this.  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     ADVAIR HFA 115-21 MCG/ACT inhaler INHALE 2 PUFFS BY MOUTH INTO THE LUNGS TWICE DAILY AS NEEDED (Patient taking differently: Inhale 1 puff into the lungs daily.) 12 g 2    atenolol (TENORMIN) 25 MG tablet TAKE 1 TABLET TWICE DAILY 180 tablet 3   cholecalciferol (VITAMIN D) 1000 units tablet Take 1,000 Units by mouth every other day. At bedtime.     CREAM BASE EX Apply 1-2 application topically 4 (four) times daily as needed (for arthritis pain.). Penetrex Inflammation Formula (Arnica/Pyridoxine/MSM/Boswellia/Cetyl Myristoleate)     cyclobenzaprine (FLEXERIL) 5 MG tablet Take 2.5 mg by mouth at bedtime.     fluticasone (FLONASE) 50 MCG/ACT nasal spray Place into both nostrils daily.     hydrochlorothiazide (HYDRODIURIL) 25 MG tablet TAKE 1 TABLET EVERY DAY 90 tablet 0   HYDROcodone-acetaminophen (NORCO/VICODIN) 5-325 MG tablet Take 1 tablet by mouth daily as needed for moderate pain. (Patient taking differently: Take 0.25 tablets by mouth daily as needed for moderate pain.) 15 tablet 0   ibuprofen (ADVIL,MOTRIN) 200 MG tablet Take 200 mg by mouth at bedtime as needed for moderate pain (for arthritis pain.).     Lidocaine 4 % PTCH Apply 1 patch topically daily as needed (pain).     losartan (COZAAR) 50 MG tablet TAKE 1/2 TABLET IN THE MORNING  AND TAKE 1/2 TABLET IN THE EVENING 90 tablet 0   Menthol-Methyl Salicylate (PAIN RELIEVING) CREA Apply 1 application topically 4 (four) times daily as needed (for arthritis pain). [facility] PAIN RELIEVING CREAM  Menthol-Methyl Salicylate (SALONPAS PAIN RELIEF PATCH EX) Apply 1 patch topically daily as needed (pain).     mirtazapine (REMERON SOL-TAB) 15 MG disintegrating tablet DISSOLVE 1 TABLET  ON  TONGUE AT BEDTIME (Patient taking differently: Take 1 mg by mouth at bedtime as needed (insomnia).) 20 tablet 0   No current facility-administered medications for this visit.    Allergies  Allergen Reactions   Avelox [Moxifloxacin Hcl In Nacl] Other (See Comments)    Chills   Trazodone And Nefazodone Swelling   Clarithromycin Other (See Comments)    Unsure of reaction type    Family History  Problem Relation Age of Onset    Breast cancer Mother 55   Arthritis Maternal Grandmother    Diabetes Maternal Grandmother     Social History   Socioeconomic History   Marital status: Single    Spouse name: Not on file   Number of children: Not on file   Years of education: Not on file   Highest education level: Not on file  Occupational History   Not on file  Tobacco Use   Smoking status: Former    Packs/day: 0.50    Years: 7.00    Pack years: 3.50    Types: Cigarettes   Smokeless tobacco: Never   Tobacco comments:    quit over 40 years ago  Vaping Use   Vaping Use: Never used  Substance and Sexual Activity   Alcohol use: Yes    Alcohol/week: 4.0 standard drinks    Types: 4 Glasses of wine per week   Drug use: No   Sexual activity: Not Currently  Other Topics Concern   Not on file  Social History Narrative   Not on file   Social Determinants of Health   Financial Resource Strain: Not on file  Food Insecurity: Not on file  Transportation Needs: Not on file  Physical Activity: Not on file  Stress: Not on file  Social Connections: Not on file  Intimate Partner Violence: Not on file     Constitutional: Denies fever, malaise, fatigue, headache or abrupt weight changes.  Respiratory: Denies difficulty breathing, shortness of breath, cough or sputum production.   Cardiovascular: Denies chest pain, chest tightness, palpitations or swelling in the hands or feet.  Musculoskeletal: Patient reports chronic low back pain.  Denies decrease in range of motion, difficulty with gait, muscle pain or joint swelling.  Skin: Denies redness, rashes, lesions or ulcercations.  Neurological: Patient reports neuropathic pain in hands and feet, difficulty with balance.  Denies dizziness, difficulty with memory, difficulty with speech or problems with coordination.    No other specific complaints in a complete review of systems (except as listed in HPI above).  Objective:  BP (!) 132/53 (BP Location: Left Arm,  Patient Position: Sitting, Cuff Size: Large)    Pulse 61    Temp (!) 97.3 F (36.3 C) (Temporal)    Wt 183 lb (83 kg)    SpO2 100%    BMI 34.58 kg/m   Wt Readings from Last 3 Encounters:  01/07/22 182 lb (82.6 kg)  11/20/21 187 lb 3.2 oz (84.9 kg)  07/17/21 179 lb 9.6 oz (81.5 kg)    General: Appears her stated age, obese, in NAD. Skin: Warm, dry and intact. No rashes noted. Cardiovascular: Normal rate and rhythm. S1,S2 noted.  No murmur, rubs or gallops noted.  Radial and pedal pulses 2+ bilaterally. Pulmonary/Chest: Normal effort and positive vesicular breath sounds. No respiratory distress. No wheezes, rales or ronchi  noted.  Musculoskeletal: Normal flexion of the cervical spine.  Decreased extension and rotation of the cervical spine.  No bony tenderness noted over the cervical or lumbar spine.  Strength 5/5 BUE/BLE.  Handgrips equal.  Steady gait without device. Neurological: Alert and oriented.  Negative Phalen's.  Negative Tinel's.  Decreased sensation by monofilament testing in the bilateral feet, left worse than right.  She has difficulty with balance with tandem walking.  BMET    Component Value Date/Time   NA 141 07/17/2021 1052   NA 140 01/10/2020 0950   K 4.0 07/17/2021 1052   CL 107 07/17/2021 1052   CO2 28 07/17/2021 1052   GLUCOSE 97 07/17/2021 1052   BUN 14 07/17/2021 1052   BUN 15 01/10/2020 0950   CREATININE 0.81 07/17/2021 1052   CALCIUM 10.0 07/17/2021 1052   GFRNONAA 64 01/10/2020 0950   GFRAA 74 01/10/2020 0950    Lipid Panel     Component Value Date/Time   CHOL 187 07/17/2021 1052   CHOL 203 (H) 01/10/2020 0950   TRIG 78 07/17/2021 1052   HDL 61 07/17/2021 1052   HDL 63 01/10/2020 0950   CHOLHDL 3.1 07/17/2021 1052   VLDL 9 03/22/2020 1139   LDLCALC 109 (H) 07/17/2021 1052    CBC    Component Value Date/Time   WBC 5.2 07/17/2021 1052   RBC 4.05 07/17/2021 1052   HGB 13.1 07/17/2021 1052   HGB 13.9 01/10/2020 0950   HCT 38.7 07/17/2021 1052    HCT 40.1 01/10/2020 0950   PLT 210 07/17/2021 1052   PLT 227 01/10/2020 0950   MCV 95.6 07/17/2021 1052   MCV 97 01/10/2020 0950   MCH 32.3 07/17/2021 1052   MCHC 33.9 07/17/2021 1052   RDW 12.4 07/17/2021 1052   RDW 12.3 01/10/2020 0950   LYMPHSABS 1.0 01/10/2020 0950   EOSABS 0.3 01/10/2020 0950   BASOSABS 0.0 01/10/2020 0950    Hgb A1C Lab Results  Component Value Date   HGBA1C 5.0 12/05/2020            Assessment & Plan:   Peripheral Neuropathy, History of Carpal Tunnel Syndrome, Chronic Low Back Pain with Radiculopathy:  We will check TSH, vitamin D, B12 and A1c today Discussed possibility of medication management with Gabapentin however she would like to hold off at this time till labs are reviewed If labs are normal, she would consider MRI lumbar spine for reevaluation to see if her impingement has worsened Encourage daily stretching Continue Tylenol and Ibuprofen OTC as needed  Balance Problem:  Referral to PT for further evaluation and treatment  We will follow-up after labs with further recommendation and treatment plan   Webb Silversmith, NP This visit occurred during the SARS-CoV-2 public health emergency.  Safety protocols were in place, including screening questions prior to the visit, additional usage of staff PPE, and extensive cleaning of exam room while observing appropriate contact time as indicated for disinfecting solutions.

## 2022-01-22 NOTE — Patient Instructions (Signed)

## 2022-01-23 ENCOUNTER — Encounter: Payer: Self-pay | Admitting: Internal Medicine

## 2022-01-23 ENCOUNTER — Ambulatory Visit: Payer: Medicare HMO | Admitting: Internal Medicine

## 2022-01-23 VITALS — BP 130/80 | HR 64 | Ht 61.0 in | Wt 181.4 lb

## 2022-01-23 DIAGNOSIS — J4520 Mild intermittent asthma, uncomplicated: Secondary | ICD-10-CM

## 2022-01-23 DIAGNOSIS — G4733 Obstructive sleep apnea (adult) (pediatric): Secondary | ICD-10-CM

## 2022-01-23 LAB — HEMOGLOBIN A1C
Hgb A1c MFr Bld: 5.1 % of total Hgb (ref <5.7)
Mean Plasma Glucose: 100 mg/dL
eAG (mmol/L): 5.5 mmol/L

## 2022-01-23 LAB — VITAMIN D 25 HYDROXY (VIT D DEFICIENCY, FRACTURES): Vit D, 25-Hydroxy: 35 ng/mL (ref 30–100)

## 2022-01-23 LAB — TSH: TSH: 2.18 mIU/L (ref 0.40–4.50)

## 2022-01-23 LAB — VITAMIN B12: Vitamin B-12: 775 pg/mL (ref 200–1100)

## 2022-01-23 MED ORDER — ALBUTEROL SULFATE HFA 108 (90 BASE) MCG/ACT IN AERS: 2.0000 | INHALATION_SPRAY | RESPIRATORY_TRACT | 8 refills | Status: DC | PRN

## 2022-01-23 NOTE — Progress Notes (Signed)
@Patient  ID: Molly Faulkner, female    DOB: 21-Jul-1941, 81 y.o.   MRN: [id number]  CC Follow up OSA Follow up ASTHMA  HPI: 81 year old female, former smoker (3.5-pack-year history).  History significant for HTN, chronic seasonal allergies, hyperlipidemia, asthma and OSA.    Patient doing extremely well with CPAP therapy Represent compliance for days and greater than 4 hours AHI reduced to 0.9 Patient is a benefits from therapy Patient uses nasal pillows CPAP 10  No exacerbation at this time No evidence of heart failure at this time No evidence or signs of infection at this time No respiratory distress No fevers, chills, nausea, vomiting, diarrhea No evidence of lower extremity edema No evidence hemoptysis   Asthma is well controlled Plan to wean off Advair Use albuterol only as needed   Allergies  Allergen Reactions   Avelox [Moxifloxacin Hcl In Nacl] Other (See Comments)    Chills   Trazodone And Nefazodone Swelling   Clarithromycin Other (See Comments)    Unsure of reaction type    Immunization History  Administered Date(s) Administered   Fluad Quad(high Dose 65+) 09/14/2019, 09/06/2020, 09/10/2021   Influenza,inj,Quad PF,6+ Mos 09/06/2014, 08/23/2015, 09/09/2016, 09/22/2017, 09/23/2018   Influenza-Unspecified 08/16/2013   PFIZER(Purple Top)SARS-COV-2 Vaccination 01/20/2020, 02/10/2020, 09/14/2020   Pneumococcal Conjugate-13 02/14/2016   Pneumococcal Polysaccharide-23 01/17/2012   Tdap 12/17/2011   Zoster, Live 12/17/2011    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     ADVAIR HFA 115-21 MCG/ACT inhaler INHALE 2 PUFFS BY MOUTH INTO THE LUNGS TWICE DAILY AS NEEDED (Patient taking differently: Inhale 1 puff into the lungs daily.) 12 g 2   atenolol (TENORMIN) 25 MG tablet TAKE 1 TABLET TWICE DAILY 180 tablet 3   cholecalciferol (VITAMIN D) 1000 units tablet Take 1,000 Units by mouth every other day. At bedtime.     CREAM BASE EX Apply 1-2 application topically 4 (four) times daily as needed (for arthritis pain.). Penetrex Inflammation Formula (Arnica/Pyridoxine/MSM/Boswellia/Cetyl Myristoleate)     cyclobenzaprine (FLEXERIL) 5 MG tablet Take 2.5 mg by mouth at bedtime.     fluticasone (FLONASE) 50 MCG/ACT nasal spray Place into both nostrils daily.     hydrochlorothiazide (HYDRODIURIL) 25 MG tablet TAKE 1 TABLET EVERY DAY 90 tablet 0   HYDROcodone-acetaminophen (NORCO/VICODIN) 5-325 MG tablet Take 1 tablet by mouth daily as needed  for moderate pain. (Patient taking differently: Take 0.25 tablets by mouth daily as needed for moderate pain.) Molly tablet 0   ibuprofen (ADVIL,MOTRIN) 200 MG tablet Take 200 mg by mouth at bedtime as needed for moderate pain (for arthritis pain.).     Lidocaine 4 % PTCH Apply 1 patch topically daily as needed (pain).     losartan (COZAAR) 50 MG tablet TAKE 1/2 TABLET IN THE MORNING  AND TAKE 1/2 TABLET IN THE EVENING 90 tablet 0   Menthol-Methyl Salicylate (PAIN RELIEVING) CREA Apply 1 application topically 4 (four)  times daily as needed (for arthritis pain). EFAC PAIN RELIEVING CREAM     Menthol-Methyl Salicylate (SALONPAS PAIN RELIEF PATCH EX) Apply 1 patch topically daily as needed (pain).     mirtazapine (REMERON SOL-TAB) Molly MG disintegrating tablet DISSOLVE 1 TABLET  ON  TONGUE AT BEDTIME (Patient taking differently: Take 1 mg by mouth at bedtime as needed (insomnia).) 20 tablet 0   No facility-administered medications prior to visit.     Review of Systems:  Gen:  Denies  fever, sweats, chills weight loss  HEENT: Denies blurred vision, double vision, ear pain, eye pain, hearing loss, nose bleeds, sore throat Cardiac:  No dizziness, chest pain or heaviness, chest tightness,edema, No JVD Resp:   No cough, -sputum production, -shortness of breath,-wheezing, -hemoptysis,  Other:  All other systems negative  Physical Exam BP 130/80 (BP Location: Left Arm, Patient Position: Sitting, Cuff Size: Normal)    Pulse 64    Ht 5\' 1"  (1.549 m)    Wt 181 lb 6.4 oz (82.3 kg)    SpO2 97%    BMI 34.28 kg/m    Physical Examination:   General Appearance: No distress  EYES PERRLA, EOM intact.   NECK Supple, No JVD Pulmonary: normal breath sounds, No wheezing.  CardiovascularNormal S1,S2.  No m/r/g.   ALL OTHER ROS ARE NEGATIVE  Lab Results:  CBC    Component Value Date/Time   WBC 5.2 07/17/2021 1052   RBC 4.05 07/17/2021 1052   HGB 13.1 07/17/2021 1052   HGB 13.9 01/10/2020 0950   HCT 38.7 07/17/2021 1052   HCT 40.1 01/10/2020 0950   PLT 210 07/17/2021 1052   PLT 227 01/10/2020 0950   MCV 95.6 07/17/2021 1052   MCV 97 01/10/2020 0950   MCH 32.3 07/17/2021 1052   MCHC 33.9 07/17/2021 1052   RDW 12.4 07/17/2021 1052   RDW 12.3 01/10/2020 0950   LYMPHSABS 1.0 01/10/2020 0950   EOSABS 0.3 01/10/2020 0950   BASOSABS 0.0 01/10/2020 0950    BMET    Component Value Date/Time   NA 141 07/17/2021 1052   NA 140 01/10/2020 0950   K 4.0 07/17/2021 1052   CL 107 07/17/2021 1052   CO2 28 07/17/2021  1052   GLUCOSE 97 07/17/2021 1052   BUN 14 07/17/2021 1052   BUN Molly 01/10/2020 0950   CREATININE 0.81 07/17/2021 1052   CALCIUM 10.0 07/17/2021 1052   GFRNONAA 64 01/10/2020 0950   GFRAA 74 01/10/2020 0950      Assessment & Plan:   OSA on CPAP Compliance report reviewed in detail with the patient Patient with excellent compliance OSA well-controlled  Asthma, mild intermittent well-controlled Clinically stable;  She has no acute symptoms or recent exacerbations. Rarely requires SABA Will hold advair and assess    MEDICATION ADJUSTMENTS/LABS AND TESTS ORDERED: Lets plan to stop ADVAIR and restart of needed Continue CPAP as prescribed Avoid secondhand Avoid agents  avoid  sick contacts   CURRENT MEDICATIONS REVIEWED AT LENGTH WITH PATIENT TODAY   Patient  satisfied with Plan of action and management. All questions answered  Follow up   Total Time Spent  Molly Faulkner   Maretta Bees Patricia Pesa, M.D.  [facility]  Medical Director [facility] Cardio-Pulmonary Department

## 2022-01-23 NOTE — Patient Instructions (Addendum)
Lets plan to stop ADVAIR and restart of needed Continue CPAP as prescribed  GREAT JOB! A+  Avoid secondhand Avoid agents  avoid sick contacts

## 2022-01-24 ENCOUNTER — Ambulatory Visit: Payer: Medicare HMO | Admitting: Internal Medicine

## 2022-01-30 ENCOUNTER — Other Ambulatory Visit: Payer: Self-pay

## 2022-01-30 ENCOUNTER — Ambulatory Visit: Payer: Medicare HMO | Attending: Internal Medicine | Admitting: Physical Therapy

## 2022-01-30 ENCOUNTER — Encounter: Payer: Self-pay | Admitting: Physical Therapy

## 2022-01-30 DIAGNOSIS — R2681 Unsteadiness on feet: Secondary | ICD-10-CM | POA: Diagnosis not present

## 2022-01-30 DIAGNOSIS — R2689 Other abnormalities of gait and mobility: Secondary | ICD-10-CM | POA: Insufficient documentation

## 2022-01-30 NOTE — Therapy (Signed)
[facility] [address] [place], [address] Phone: [phone]   Fax:  [phone]  Physical Therapy Evaluation  Patient Details  Name: Molly Faulkner MRN: [id number] Date of Birth: [birth year] No data recorded  Encounter Date: 01/30/2022   PT End of Session - 01/30/22 1601     Visit Number 1    Number of Visits 18    Authorization - Visit Number 1    Authorization - Number of Visits 18    PT Start Time 1430    PT Stop Time 1779    PT Time Calculation (min) 45 min    Equipment Utilized During Treatment Gait belt    Activity Tolerance Patient tolerated treatment well    Behavior During Therapy WFL for tasks assessed/performed             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   Sleep apnea    H/O   NO LONGER   Syncope     Past Surgical History:  Procedure Laterality Date   ABDOMINAL HYSTERECTOMY  1994   total   BREAST BIOPSY Bilateral [id number]   BREAST EXCISIONAL BIOPSY     CARPAL TUNNEL RELEASE Bilateral    CATARACT EXTRACTION W/PHACO Right 12/02/2017   Procedure: CATARACT EXTRACTION PHACO AND INTRAOCULAR LENS PLACEMENT ([place]);  Surgeon: Birder Robson, MD;  Location: [facility] ORS;  Service: Ophthalmology;  Laterality: Right;  [place] 00:36 AP% 11.5 CDE 4.21 Fluid pack lot # [id number]   CATARACT EXTRACTION W/PHACO Left 12/30/2017   Procedure: CATARACT EXTRACTION PHACO AND INTRAOCULAR LENS PLACEMENT ([facility]);  Surgeon: Birder Robson, MD;  Location: [facility];  Service: Ophthalmology;  Laterality: Left;  [place]  00:58 AP% 11.6 CDE 6.80 Fluid pack lot # [id number]   COLONOSCOPY WITH PROPOFOL N/A 06/30/2018   Procedure: COLONOSCOPY WITH PROPOFOL;  Surgeon: Jonathon Bellows, MD;  Location: [facility] ENDOSCOPY;  Service: Gastroenterology;  Laterality: N/A;   COLONOSCOPY WITH PROPOFOL N/A 01/07/2022   Procedure: COLONOSCOPY WITH PROPOFOL;  Surgeon: Lin Landsman, MD;  Location: [facility] ENDOSCOPY;  Service: Gastroenterology;  Laterality: N/A;   DILATION AND CURETTAGE OF UTERUS     X 2   KNEE ARTHROSCOPY      There were no vitals filed for this visit.    Subjective Assessment - 01/30/22 1433     Subjective --    Pertinent History 81 y.o. female pnt with c/c of unsteadiness on her feet s/p a hip surgury last Feb. pnt describes balance issues and general weakness that she feels in her legs. She was recently dx with neropathy and will be getting an MRI soon to check her back. Pnt lives alone, has no steps in her home, and has been using a cane prior to her hip surgury. Aggrevating factors include duel tasking on her feet, balancing on her step stool, closing her eyes in the shower, walking for more than 10 min, lifting/squatting, negotiating stairs, and standing for more  than 10 min. Pnt reports she is better in the morning and worse throughout the day once she is tired. Pnt goals include leanring balance exercises.  Releaving factors include NSAIDs and heating pads. Pnt has a rail in her downstairs shower bathroom, but that is currently being fixed so she does not have support in the shower. Pnt reports no n/t, sudden weight change, b/b changes, n/v, fever, night pain, or falls.    Limitations Standing;House hold activities;Walking;Lifting    How long can you sit comfortably? unlimited    How long can you stand comfortably? 10 min    How long can you walk comfortably? 10 min    Patient Stated Goals learn balance exercises    Currently in Pain? No/denies             Posture: Increased kyphosis Rounded  shoulders Decreased lordosis   Gait: Self selected speed .55m/s  bilateral trendelenburg, increased on left   [place] Neuro Screen: Dermatomes: not significant  Myotomes: weak L1 left hip flexion  Reflexes: not tested  Hip AROM/PROM Flexion: wnl IR: R: 20/25. L: 40/50 ER: R: 30/30. L: 20/20  Abd: wnl Add: wnl   Hip MMT Flexion: bilateral 3/5 ER: R: 5/5. L: 3-/5 IR: R:4/5. L; 4/5 Abd: R: 4+/5. L: 3-/5  Extension: R: 3+/5. L: 3-/5  Slump Test: negative  SLR: negative  CSLR: negative   STS Assessment: increase gower's sign and decreased speed   TUG Assessment: 9.23  SLS: left 12.58 sec  10 meter walk test: .18m/s   DGI: 19  ThereEx and HEP : Left SLS for 20 sec 3x a day  Tandum stance for 20 sec 3x a day       Objective measurements completed on examination: See above findings.       [place] [facility] PT Treatment/Exercise - 01/30/22 0001       Bed Mobility   Bed Mobility Not assessed      Transfers   Transfers Not assessed      Ambulation/Gait   Ambulation/Gait Yes    Ambulation/Gait Assistance 5: Supervision    Ambulation Distance (Feet) 10 Feet    Assistive device None    Gait Pattern Trendelenburg    Ambulation Surface Indoor                     PT Education - 01/30/22 1600     Education Details dx, prognosis, HEP    Person(s) Educated Patient    Methods Explanation;Demonstration;Verbal cues    Comprehension Verbalized understanding;Returned demonstration              PT Short Term Goals - 01/30/22 1624       PT SHORT TERM GOAL #1   Title Pnt will be independent with HEP in order to progress independently at home    Baseline 01/30/22 HEP given    Time 4    Period Weeks    Status New    Target Date 02/27/22               PT Long Term Goals - 01/30/22 1625       PT LONG TERM GOAL #1   Title Pnt will increase left SLS time to 18.2 sec in order to acheive age norm for static balance on one leg in order  to acheive functional ADLs    Baseline 01/30/22 left 12.58 sec    Time 8    Period Weeks    Status New  Target Date 03/27/22      PT LONG TERM GOAL #2   Title Pnt will decrease 10 meter walk test by.13 m/s to show clinically significant change in ambulation in order to complete functional community ADLs.      PT LONG TERM GOAL #3   Title pnt will decrease TUG time to 7.7 seconds in order to meet age norms to show functional LE strength and dynamic balance in order to complete ADLs    Baseline 01/30/22 9.23 sec    Time 8    Period Weeks    Status New    Target Date 03/27/22      PT LONG TERM GOAL #4   Title pnt will be able to step over obstacle (1 shoe box) without changing gait speed in order to safely negotiate terrain during travels.    Baseline 01/30/22 gait speed slowed when approaching shoe box    Time 8    Period Weeks    Status New                    Plan - 01/30/22 1601     Clinical Impression Statement Pnt presents with unsteadiness on feet and fall risk. Pnt impairments include decreased left hip ROM, left hip ER MMT, bilateral hip extension MMT,  left hip abductor MMT, and decreased dynamic/static balance. The listed deficits decrease pnt ability to squat/stoop, negotiate stairs,stand for more than 10 min, community ambulation, and standing more than 10 min. This inhibits pnt participation in community and functional ADLs such as walking in the grocery store, cleaning her home, standing on her stool, showering safely, and dual tasking while standing. Pnt tolerated therex well with good motivation and demonstrated good verbal understanding as well as demonstration of HEP. Pnt requires skilled physical therapy to address listed deficits to increase strenght, imporve ROM, and improve balance in order to safely ambulate and function in the comunity and at home to decrease flal risk.    Personal Factors and Comorbidities Age;Time since onset of  injury/illness/exacerbation;Past/Current Experience    Examination-Activity Limitations Bathing;Carry;Lift;Stand;Locomotion Level;Bend;Squat;Caring for Others;Hygiene/Grooming;Stairs    Examination-Participation Restrictions Driving;Cleaning;Interpersonal Relationship;Yard Work;Community Activity;Laundry    Stability/Clinical Decision Making Evolving/Moderate complexity    Clinical Decision Making Moderate    Rehab Potential Good    PT Frequency 2x / week    PT Duration 8 weeks    PT Treatment/Interventions ADLs/Self Care Home Management;Electrical Stimulation;Moist Heat;Traction;Ultrasound;Gait training;Stair training;Therapeutic activities;Functional mobility training;Therapeutic exercise;Balance training;Neuromuscular re-education;Patient/family education;Wheelchair mobility training;Manual techniques;Passive range of motion;Dry needling;Energy conservation;Spinal Manipulations;Joint Manipulations    PT Next Visit Plan increase gross hip strength    PT Home Exercise Plan tandum stance, SLS    Consulted and Agree with Plan of Care Patient             Patient will benefit from skilled therapeutic intervention in order to improve the following deficits and impairments:  Abnormal gait, Decreased activity tolerance, Decreased endurance, Decreased range of motion, Decreased strength, Hypomobility, Improper body mechanics, Decreased balance, Decreased coordination, Decreased mobility, Difficulty walking, Postural dysfunction, Impaired tone, Impaired flexibility  Visit Diagnosis: Unsteadiness on feet     Problem List Patient Active Problem List   Diagnosis Date Noted   History of colonic polyps    History of revision of total replacement of left hip joint 04/23/2021   Chronic right shoulder pain 04/16/2021   History of total hip arthroplasty 02/05/2021   Degeneration of lumbar intervertebral disc 07/25/2020   Osteoarthritis of lower back 05/29/2018  OSA on CPAP 07/01/2016    Hyperlipidemia 02/21/2015   Insomnia 07/25/2014   HTN (hypertension) 07/25/2014   GERD 07/25/2014   Depression 07/25/2014   Asthma, moderate persistent, well-controlled 07/25/2014   Molly Faulkner, SPT  Patrina Levering PT, DPT   [facility] [address] [place], [address] Phone: [phone]   Fax:  [phone]  Name: ELISHEBA MCDONNELL MRN: [id number] Date of Birth: 09/14/1941

## 2022-02-01 ENCOUNTER — Encounter: Payer: Self-pay | Admitting: Internal Medicine

## 2022-02-01 DIAGNOSIS — M479 Spondylosis, unspecified: Secondary | ICD-10-CM

## 2022-02-06 ENCOUNTER — Ambulatory Visit: Payer: Medicare HMO | Admitting: Physical Therapy

## 2022-02-13 ENCOUNTER — Encounter: Payer: Medicare HMO | Admitting: Physical Therapy

## 2022-02-18 ENCOUNTER — Encounter: Payer: Medicare HMO | Admitting: Physical Therapy

## 2022-02-20 ENCOUNTER — Encounter: Payer: Medicare HMO | Admitting: Physical Therapy

## 2022-02-22 DIAGNOSIS — Z20822 Contact with and (suspected) exposure to covid-19: Secondary | ICD-10-CM | POA: Diagnosis not present

## 2022-02-25 ENCOUNTER — Encounter: Payer: Medicare HMO | Admitting: Physical Therapy

## 2022-02-27 ENCOUNTER — Encounter: Payer: Medicare HMO | Admitting: Physical Therapy

## 2022-03-04 ENCOUNTER — Encounter: Payer: Medicare HMO | Admitting: Physical Therapy

## 2022-03-06 ENCOUNTER — Encounter: Payer: Medicare HMO | Admitting: Physical Therapy

## 2022-03-11 ENCOUNTER — Encounter: Payer: Medicare HMO | Admitting: Physical Therapy

## 2022-03-13 ENCOUNTER — Ambulatory Visit: Payer: Medicare HMO | Admitting: Physical Therapy

## 2022-03-13 ENCOUNTER — Ambulatory Visit
Admission: RE | Admit: 2022-03-13 | Discharge: 2022-03-13 | Disposition: A | Discharge status: Home / Self Care | Payer: Medicare HMO | Source: Ambulatory Visit | Attending: Internal Medicine | Admitting: Internal Medicine

## 2022-03-13 DIAGNOSIS — M48061 Spinal stenosis, lumbar region without neurogenic claudication: Secondary | ICD-10-CM | POA: Diagnosis not present

## 2022-03-13 DIAGNOSIS — M545 Low back pain, unspecified: Secondary | ICD-10-CM | POA: Diagnosis not present

## 2022-03-13 DIAGNOSIS — M479 Spondylosis, unspecified: Secondary | ICD-10-CM

## 2022-03-13 DIAGNOSIS — M2578 Osteophyte, vertebrae: Secondary | ICD-10-CM | POA: Diagnosis not present

## 2022-03-14 ENCOUNTER — Other Ambulatory Visit: Payer: Medicare HMO

## 2022-04-15 ENCOUNTER — Other Ambulatory Visit: Payer: Self-pay | Admitting: Cardiovascular Disease

## 2022-04-15 NOTE — Telephone Encounter (Signed)
Please schedule overdue 12 month F/U appointment for refills. Thank you! ?

## 2022-04-15 NOTE — Telephone Encounter (Signed)
Patient requesting 90 day supply of medication to be sent to mail order. Last seen 2/22. Overdue for 12 month F/U appointment but scheduled to be seen on 05/31/22. Please advise if OK to refill for 90 day supply. Thank you! ?

## 2022-04-25 ENCOUNTER — Ambulatory Visit (INDEPENDENT_AMBULATORY_CARE_PROVIDER_SITE_OTHER): Payer: Medicare HMO | Admitting: Internal Medicine

## 2022-04-25 ENCOUNTER — Encounter: Payer: Self-pay | Admitting: Internal Medicine

## 2022-04-25 VITALS — BP 144/67 | HR 56 | Temp 97.1°F | Wt 180.0 lb

## 2022-04-25 DIAGNOSIS — B852 Pediculosis, unspecified: Secondary | ICD-10-CM

## 2022-04-25 MED ORDER — PERMETHRIN 5 % EX CREA: TOPICAL_CREAM | CUTANEOUS | 1 refills | Status: DC

## 2022-04-25 NOTE — Progress Notes (Signed)
? ?Subjective:  ? ? Patient ID: Molly Faulkner, female    DOB: 1941/02/01, 81 y.o.   MRN: [id number] ? ?HPI ? ?Patient presents to clinic today with complaint of bug bites. She has found bugs in her hair on her scalp and pubic area. She reports the bugs bite, leaving whelps in various places. She had this issue last year, exterminated her home. She brought a card with the insects taped down. She thinks it may be lice. ? ?Review of Systems ? ?   ?Past Medical History:  ?Diagnosis Date  ? Allergy   ? Arthritis   ? Asthma   ? Carpal tunnel syndrome 12/2017  ? left, referred to neurology for EMG testing  ? 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/2017  ? Chicken pox   ? Depression   ? Diverticulitis   ? Diverticulosis   ? Dysrhythmia   ? paroxysmal tachycardia  ? GERD (gastroesophageal reflux disease)   ? Hx of colonic polyps   ? Hyperlipidemia   ? Hypertension   ? Insomnia   ? 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 Sleep apnea   ? H/O   NO LONGER  ? Syncope   ? ? ?Current Outpatient Medications  ?Medication Sig Dispense Refill  ? acetaminophen (TYLENOL) 500 MG tablet Take 500 mg by mouth every 8 (eight) hours as needed for moderate pain.    ? ADVAIR HFA 115-21 MCG/ACT inhaler INHALE 2 PUFFS BY MOUTH INTO THE LUNGS TWICE DAILY AS NEEDED (Patient taking differently: Inhale 1 puff into the lungs daily.) 12 g 2  ? albuterol (VENTOLIN HFA) 108 (90 Base) MCG/ACT inhaler Inhale 2 puffs into the lungs every 4 (four) hours as needed for wheezing or shortness of breath. 8 g 8  ? atenolol (TENORMIN) 25 MG tablet TAKE 1 TABLET TWICE DAILY 60 tablet 1  ? cholecalciferol (VITAMIN D) 1000 units tablet Take 1,000 Units by mouth every other day. At bedtime.    ? CREAM BASE EX Apply 1-2 application topically 4 (four) times daily as needed (for arthritis pain.). Penetrex Inflammation Formula ([place]/Pyridoxine/MSM/Boswellia/Cetyl Myristoleate)    ? cyclobenzaprine (FLEXERIL) 5 MG tablet Take 2.5 mg by mouth at bedtime.    ? fluticasone (FLONASE) 50 MCG/ACT nasal spray  Place into both nostrils daily.    ? hydrochlorothiazide (HYDRODIURIL) 25 MG tablet TAKE 1 TABLET EVERY DAY 90 tablet 0  ? HYDROcodone-acetaminophen (NORCO/VICODIN) 5-325 MG tablet Take 1 tablet by mouth daily as needed for moderate pain. (Patient taking differently: Take 0.25 tablets by mouth daily as needed for moderate pain.) 15 tablet 0  ? ibuprofen (ADVIL,MOTRIN) 200 MG tablet Take 200 mg by mouth at bedtime as needed for moderate pain (for arthritis pain.).    ? Lidocaine 4 % PTCH Apply 1 patch topically daily as needed (pain).    ? losartan (COZAAR) 50 MG tablet TAKE 1/2 TABLET IN THE MORNING  AND TAKE 1/2 TABLET IN THE EVENING 90 tablet 0  ? Menthol-Methyl Salicylate (PAIN RELIEVING) CREA Apply 1 application topically 4 (four) times daily as needed (for arthritis pain). EFAC PAIN RELIEVING CREAM    ? Menthol-Methyl Salicylate (SALONPAS PAIN RELIEF PATCH EX) Apply 1 patch topically daily as needed (pain).    ? mirtazapine (REMERON SOL-TAB) 15 MG disintegrating tablet DISSOLVE 1 TABLET  ON  TONGUE AT BEDTIME (Patient taking differently: Take 1 mg by mouth at bedtime as needed (insomnia).) 20 tablet 0  ? ?No current facility-administered medications for this visit.  ? ? ?Allergies  ?Allergen  Reactions  ? Avelox [Moxifloxacin Hcl In Nacl] Other (See Comments)  ?  Chills  ? Trazodone And Nefazodone Swelling  ? Clarithromycin Other (See Comments)  ?  Unsure of reaction type  ? ? ?Family History  ?Problem Relation Age of Onset  ? Breast cancer Mother 79  ? Arthritis Maternal Grandmother   ? Diabetes Maternal Grandmother   ? ? ?Social History  ? ?Socioeconomic History  ? Marital status: Single  ?  Spouse name: Not on file  ? Number of children: Not on file  ? Years of education: Not on file  ? Highest education level: Not on file  ?Occupational History  ? Not on file  ?Tobacco Use  ? Smoking status: Former  ?  Packs/day: 0.50  ?  Years: 7.00  ?  Pack years: 3.50  ?  Types: Cigarettes  ? Smokeless tobacco: Never  ?  Tobacco comments:  ?  quit over 40 years ago  ?Vaping Use  ? Vaping Use: Never used  ?Substance and Sexual Activity  ? Alcohol use: Yes  ?  Alcohol/week: 4.0 standard drinks  ?  Types: 4 Glasses of wine per week  ? Drug use: No  ? Sexual activity: Not Currently  ?Other Topics Concern  ? Not on file  ?Social History Narrative  ? Not on file  ? ?Social Determinants of Health  ? ?Financial Resource Strain: Not on file  ?Food Insecurity: Not on file  ?Transportation Needs: Not on file  ?Physical Activity: Not on file  ?Stress: Not on file  ?Social Connections: Not on file  ?Intimate Partner Violence: Not on file  ? ? ? ?Constitutional: Denies fever, malaise, fatigue, headache or abrupt weight changes.  ?Respiratory: Denies difficulty breathing, shortness of breath, cough or sputum production.   ?Cardiovascular: Denies chest pain, chest tightness, palpitations or swelling in the hands or feet.  ?Skin: Pt reports itchy scalp, bug bites. Denies redness, rashes, or ulcercations.  ?Neurological: Denies dizziness, difficulty with memory, difficulty with speech or problems with balance and coordination.  ?Psych: Denies anxiety, depression, SI/HI. ? ?No other specific complaints in a complete review of systems (except as listed in HPI above). ? ?Objective:  ? Physical Exam ?BP (!) 144/67 (BP Location: Left Arm, Patient Position: Sitting, Cuff Size: Large)   Pulse (!) 56   Temp (!) 97.1 ?F (36.2 ?C) (Temporal)   Wt 180 lb (81.6 kg)   SpO2 100%   BMI 34.01 kg/m?  ? ?Wt Readings from Last 3 Encounters:  ?01/23/22 181 lb 6.4 oz (82.3 kg)  ?01/22/22 183 lb (83 kg)  ?01/07/22 182 lb (82.6 kg)  ? ? ?General: Appears her stated age, obese, in NAD. ?Skin: Scattered papules noted of neck, upper arm and thigh. ?HEENT: Head: normal shape and size, no lice or nits noted in scalp;  ?Cardiovascular: Bradycardic. ?Pulmonary/Chest: Normal effort. ?Neurological: Alert and oriented.  ? ?BMET ?   ?Component Value Date/Time  ? NA 141  07/17/2021 1052  ? NA 140 01/10/2020 0950  ? K 4.0 07/17/2021 1052  ? CL 107 07/17/2021 1052  ? CO2 28 07/17/2021 1052  ? GLUCOSE 97 07/17/2021 1052  ? BUN 14 07/17/2021 1052  ? BUN 15 01/10/2020 0950  ? CREATININE 0.81 07/17/2021 1052  ? CALCIUM 10.0 07/17/2021 1052  ? GFRNONAA 64 01/10/2020 0950  ? GFRAA 74 01/10/2020 0950  ? ? ?Lipid Panel  ?   ?Component Value Date/Time  ? CHOL 187 07/17/2021 1052  ? CHOL 203 (H) 01/10/2020  9449  ? TRIG 78 07/17/2021 1052  ? HDL 61 07/17/2021 1052  ? HDL 63 01/10/2020 0950  ? CHOLHDL 3.1 07/17/2021 1052  ? VLDL 9 03/22/2020 1139  ? LDLCALC 109 (H) 07/17/2021 1052  ? ? ?CBC ?   ?Component Value Date/Time  ? WBC 5.2 07/17/2021 1052  ? RBC 4.05 07/17/2021 1052  ? HGB 13.1 07/17/2021 1052  ? HGB 13.9 01/10/2020 0950  ? HCT 38.7 07/17/2021 1052  ? HCT 40.1 01/10/2020 0950  ? PLT 210 07/17/2021 1052  ? PLT 227 01/10/2020 0950  ? MCV 95.6 07/17/2021 1052  ? MCV 97 01/10/2020 0950  ? MCH 32.3 07/17/2021 1052  ? MCHC 33.9 07/17/2021 1052  ? RDW 12.4 07/17/2021 1052  ? RDW 12.3 01/10/2020 0950  ? LYMPHSABS 1.0 01/10/2020 0950  ? EOSABS 0.3 01/10/2020 0950  ? BASOSABS 0.0 01/10/2020 0950  ? ? ?Hgb A1C ?Lab Results  ?Component Value Date  ? HGBA1C 5.1 01/22/2022  ? ? ? ? ? ? ?   ?Assessment & Plan:  ? ?Lice: ? ?Rx for Permethrin 5% cream-use as directed, may repeat in 14 days ?Wash everything you can in extremely hot water ?Place everything that you cannot wash in a Ziploc bag for 1 week ? ?RTC in 3 months for your annual exam ? ? ?Webb Silversmith, NP ? ?

## 2022-04-25 NOTE — Patient Instructions (Signed)
Lice, Adult ? ?  ? ?Lice are tiny insects with claws on the ends of their legs. They are parasites, which means they need to live off another animal to survive. Lice hatch from little round eggs (nits) that are attached to the base of hairs. ?There are three different types of lice: ?Head lice. These lice live on the hair on a person's head. ?Body lice. These lice live on body hair. ?Pubic lice. These lice live on pubic hair. ?Lice can spread from one person to another. Pets do not play a role in transmission. Lice crawl. They do not fly or jump. Lice cause skin irritation and intense itching in the area of the infested hair. Although having lice can be annoying, it is not dangerous. Lice do not spread diseases. Treatment will usually eliminate symptoms within a few days. ?What are the causes? ?This condition may be caused by: ?Very close contact with an infested person. ?Sharing infested items that touch the skin and hair. These include personal items, such as hats, combs, brushes, towels, clothing, pillowcases, or sheets. ?Pubic lice are spread through sexual contact. ?What increases the risk? ?Although having lice is more common among young children, anyone can get lice. Warm weather increases the risk of getting lice. ?What are the signs or symptoms? ?Symptoms of this condition include: ?Itchiness in the affected area. ?Skin irritation. ?Rash or sores on the skin. ?The feeling of something moving in the hair. ?Tiny flakes or sacs near the scalp. These may be white, yellow, or tan. ?Tiny bugs crawling on the hair, scalp, or genital area. ?Trouble sleeping, because lice are most active in the dark. ?How is this diagnosed? ?This condition is diagnosed based on: ?Signs and symptoms. ?A physical exam. ?Your health care provider will examine the affected area closely for live lice, nits, and empty nit cases. ?Nits are typically yellow or white in color. Empty nit cases are white. Lice are gray or brown in color and  about the size of a sesame seed. ?How is this treated? ?Treatment for this condition includes: ?Using a hair rinse that contains a mild insecticide to kill lice. Your health care provider will recommend a prescription or an over-the-counter hair rinse. ?Removing lice, nits, and nit cases using a comb or tweezers. ?Washing items such as clothes, towels, and bed linens in hot water. Dry them afterward. ?Bagging items that cannot be washed or vacuumed. ?Pregnant women should not use medicated shampoo or cream without first talking to a health care provider. ?Follow these instructions at home: ?Using medicated rinse ?Apply medicated hair rinse as told by your health care provider or as per the package instructions. Follow the label instructions carefully. General instructions for applying rinses may include these steps: ?Put on an old shirt or underwear, or use an old towel in case of staining from the hair rinse. ?Wash and towel-dry your head or pubic area before applying the rinse if directed to do so. ?When your hair is dry, apply the rinse. Leave the rinse in your hair for the amount of time specified in the instructions. ?Rinse the area with water. ?Comb your wet hair with a fine-tooth comb. Comb it close to the skin. Comb from the root down to the ends, removing any lice, nits, or nit cases. A lice comb may be included with the medicated rinse. ?Do not wash the infested hair for 2 days while the medicine kills the lice. ?After the treatment, repeat combing out your hair and removing lice, nits,  or nit cases from the hair every 2-3 days. Do this for about 2-3 weeks. After treatment, the remaining lice should be moving more slowly. ?Repeat the treatment in 7-10 days if necessary. ? ?Removing lice from other items ? ?Use hot water to wash all towels, hats, scarves, jackets, bedding, and clothing that you have recently used. Dry the items using the hot air cycle. ?Put any non-washable items that may have been exposed  into plastic bags. Keep the bags tightly closed for 2 weeks to kill any remaining lice. Make sure that there are no holes in the bags. ?Soak all combs and brushes in hot water for 5 to 10 minutes. ?Vacuum carpets, mattresses, and upholstered furniture to remove any loose hair. There is no need to use chemicals, which can be poisonous (toxic). Lice survive for only 1-2 days away from human skin. Nits may survive for up to 1 week. ?General instructions ?Remove any remaining lice, nits, or nit cases from hair using a fine-tooth comb. ?For head lice, ask your health care provider if other family members or close contacts should be examined or treated as well. ?For pubic lice, tell any sexual partners to seek treatment. ?Keep all follow-up visits as told by your health care provider. This is important. ?Contact a health care provider if: ?You develop sores that look infected. ?Your rash or sores do not go away in 1 week. ?The lice or nits return or do not go away after treatment. ?Summary ?Lice are tiny parasitic insects that live on the human body. A parasite needs to live off another animal to survive. ?Lice can spread from one person to another through close contact with an infested person or by sharing personal items, such as combs, brushes, or hats. Pubic lice are spread through sexual contact. ?Lice can be treated with a medicated hair rinse. Follow your health care provider's instructions, or instructions on the package label, if you are being treated with this medicine. ?For head lice, ask your health care provider if other family members or close contacts should be examined or treated as well. For pubic lice, tell any sexual partners to seek treatment. ?This information is not intended to replace advice given to you by your health care provider. Make sure you discuss any questions you have with your health care provider. ?Document Revised: 11/19/2019 Document Reviewed: 12/22/2019 ?Elsevier Patient Education ?  [name]. ? ?

## 2022-05-31 ENCOUNTER — Ambulatory Visit: Payer: Medicare HMO | Admitting: Cardiovascular Disease

## 2022-07-01 ENCOUNTER — Other Ambulatory Visit: Payer: Self-pay | Admitting: Internal Medicine

## 2022-07-01 ENCOUNTER — Other Ambulatory Visit: Payer: Self-pay | Admitting: Cardiovascular Disease

## 2022-07-02 NOTE — Telephone Encounter (Signed)
Patient will need to schedule an office visit for further refills. Requested Prescriptions  Pending Prescriptions Disp Refills  . hydrochlorothiazide (HYDRODIURIL) 25 MG tablet [Pharmacy Med Name: HYDROCHLOROTHIAZIDE 25 MG Tablet] 90 tablet 0    Sig: TAKE 1 TABLET EVERY DAY     Cardiovascular: Diuretics - Thiazide Failed - 07/01/2022  3:15 AM      Failed - Cr in normal range and within 180 days    Creat  Date Value Ref Range Status  07/17/2021 0.81 0.60 - 1.00 mg/dL Final         Failed - K in normal range and within 180 days    Potassium  Date Value Ref Range Status  07/17/2021 4.0 3.5 - 5.3 mmol/L Final         Failed - Na in normal range and within 180 days    Sodium  Date Value Ref Range Status  07/17/2021 141 135 - 146 mmol/L Final  01/10/2020 140 134 - 144 mmol/L Final         Failed - Last BP in normal range    BP Readings from Last 1 Encounters:  04/25/22 (!) 144/67         Passed - Valid encounter within last 6 months    Recent Outpatient Visits          2 months ago [facility], Coralie Keens, NP   5 months ago Polyneuropathy associated with underlying disease [facility])   [facility], Coralie Keens, NP   11 months ago Medicare annual wellness visit, subsequent   [facility], Coralie Keens, NP   1 year ago Insect bite of chest, unspecified laterality, initial encounter   [facility], Coralie Keens, NP   1 year ago Infestation by bed bug   [place], Coralie Keens, NP      Future Appointments            In 3 weeks Gollan, Kathlene November, MD [facility], [facility]

## 2022-07-26 ENCOUNTER — Ambulatory Visit (INDEPENDENT_AMBULATORY_CARE_PROVIDER_SITE_OTHER): Payer: Medicare HMO

## 2022-07-26 VITALS — Wt 180.0 lb

## 2022-07-26 DIAGNOSIS — Z780 Asymptomatic menopausal state: Secondary | ICD-10-CM

## 2022-07-26 DIAGNOSIS — Z0000 Encounter for general adult medical examination without abnormal findings: Secondary | ICD-10-CM | POA: Diagnosis not present

## 2022-07-26 NOTE — Patient Instructions (Signed)
Molly Faulkner , Thank you for taking time to come for your Medicare Wellness Visit. I appreciate your ongoing commitment to your health goals. Please review the following plan we discussed and let me know if I can assist you in the future.   Screening recommendations/referrals: Colonoscopy: 01/07/22 Mammogram: AGED OUT Bone Density: 04/21/17, referral sent Recommended yearly ophthalmology/optometry visit for glaucoma screening and checkup Recommended yearly dental visit for hygiene and checkup  Vaccinations: Influenza vaccine: 09/10/21 Pneumococcal vaccine: 02/14/16 Tdap vaccine: 12/17/11, due if have injury Shingles vaccine: Zostavax 12/17/11   Covid-19:01/20/20, 02/10/20, 09/14/20  Advanced directives: no  Conditions/risks identified: none  Next appointment: Follow up in one year for your annual wellness visit 08/01/23 @ 1:30 pm by phone   Preventive Care 65 Years and Older, Female Preventive care refers to lifestyle choices and visits with your health care provider that can promote health and wellness. What does preventive care include? A yearly physical exam. This is also called an annual well check. Dental exams once or twice a year. Routine eye exams. Ask your health care provider how often you should have your eyes checked. Personal lifestyle choices, including: Daily care of your teeth and gums. Regular physical activity. Eating a healthy diet. Avoiding tobacco and drug use. Limiting alcohol use. Practicing safe sex. Taking low-dose aspirin every day. Taking vitamin and mineral supplements as recommended by your health care provider. What happens during an annual well check? The services and screenings done by your health care provider during your annual well check will depend on your age, overall health, lifestyle risk factors, and family history of disease. Counseling  Your health care provider may ask you questions about your: Alcohol use. Tobacco use. Drug use. Emotional  well-being. Home and relationship well-being. Sexual activity. Eating habits. History of falls. Memory and ability to understand (cognition). Work and work Statistician. Reproductive health. Screening  You may have the following tests or measurements: Height, weight, and BMI. Blood pressure. Lipid and cholesterol levels. These may be checked every 5 years, or more frequently if you are over 53 years old. Skin check. Lung cancer screening. You may have this screening every year starting at age 81 if you have a 30-pack-year history of smoking and currently smoke or have quit within the past 15 years. Fecal occult blood test (FOBT) of the stool. You may have this test every year starting at age 72. Flexible sigmoidoscopy or colonoscopy. You may have a sigmoidoscopy every 5 years or a colonoscopy every 10 years starting at age 71. Hepatitis C blood test. Hepatitis B blood test. Sexually transmitted disease (STD) testing. Diabetes screening. This is done by checking your blood sugar (glucose) after you have not eaten for a while (fasting). You may have this done every 1-3 years. Bone density scan. This is done to screen for osteoporosis. You may have this done starting at age 3. Mammogram. This may be done every 1-2 years. Talk to your health care provider about how often you should have regular mammograms. Talk with your health care provider about your test results, treatment options, and if necessary, the need for more tests. Vaccines  Your health care provider may recommend certain vaccines, such as: Influenza vaccine. This is recommended every year. Tetanus, diphtheria, and acellular pertussis (Tdap, Td) vaccine. You may need a Td booster every 10 years. Zoster vaccine. You may need this after age 17. Pneumococcal 13-valent conjugate (PCV13) vaccine. One dose is recommended after age 51. Pneumococcal polysaccharide (PPSV23) vaccine. One dose is recommended  after age 32. Talk to your  health care provider about which screenings and vaccines you need and how often you need them. This information is not intended to replace advice given to you by your health care provider. Make sure you discuss any questions you have with your health care provider. Document Released: 12/29/2015 Document Revised: 08/21/2016 Document Reviewed: 10/03/2015 Elsevier Interactive Patient Education  2017 [place] Prevention in the Home Falls can cause injuries. They can happen to people of all ages. There are many things you can do to make your home safe and to help prevent falls. What can I do on the outside of my home? Regularly fix the edges of walkways and driveways and fix any cracks. Remove anything that might make you trip as you walk through a door, such as a raised step or threshold. Trim any bushes or trees on the path to your home. Use bright outdoor lighting. Clear any walking paths of anything that might make someone trip, such as rocks or tools. Regularly check to see if handrails are loose or broken. Make sure that both sides of any steps have handrails. Any raised decks and porches should have guardrails on the edges. Have any leaves, snow, or ice cleared regularly. Use sand or salt on walking paths during winter. Clean up any spills in your garage right away. This includes oil or grease spills. What can I do in the bathroom? Use night lights. Install grab bars by the toilet and in the tub and shower. Do not use towel bars as grab bars. Use non-skid mats or decals in the tub or shower. If you need to sit down in the shower, use a plastic, non-slip stool. Keep the floor dry. Clean up any water that spills on the floor as soon as it happens. Remove soap buildup in the tub or shower regularly. Attach bath mats securely with double-sided non-slip rug tape. Do not have throw rugs and other things on the floor that can make you trip. What can I do in the bedroom? Use night  lights. Make sure that you have a light by your bed that is easy to reach. Do not use any sheets or blankets that are too big for your bed. They should not hang down onto the floor. Have a firm chair that has side arms. You can use this for support while you get dressed. Do not have throw rugs and other things on the floor that can make you trip. What can I do in the kitchen? Clean up any spills right away. Avoid walking on wet floors. Keep items that you use a lot in easy-to-reach places. If you need to reach something above you, use a strong step stool that has a grab bar. Keep electrical cords out of the way. Do not use floor polish or wax that makes floors slippery. If you must use wax, use non-skid floor wax. Do not have throw rugs and other things on the floor that can make you trip. What can I do with my stairs? Do not leave any items on the stairs. Make sure that there are handrails on both sides of the stairs and use them. Fix handrails that are broken or loose. Make sure that handrails are as long as the stairways. Check any carpeting to make sure that it is firmly attached to the stairs. Fix any carpet that is loose or worn. Avoid having throw rugs at the top or bottom of the stairs. If you  do have throw rugs, attach them to the floor with carpet tape. Make sure that you have a light switch at the top of the stairs and the bottom of the stairs. If you do not have them, ask someone to add them for you. What else can I do to help prevent falls? Wear shoes that: Do not have high heels. Have rubber bottoms. Are comfortable and fit you well. Are closed at the toe. Do not wear sandals. If you use a stepladder: Make sure that it is fully opened. Do not climb a closed stepladder. Make sure that both sides of the stepladder are locked into place. Ask someone to hold it for you, if possible. Clearly mark and make sure that you can see: Any grab bars or handrails. First and last  steps. Where the edge of each step is. Use tools that help you move around (mobility aids) if they are needed. These include: Canes. Walkers. Scooters. Crutches. Turn on the lights when you go into a dark area. Replace any light bulbs as soon as they burn out. Set up your furniture so you have a clear path. Avoid moving your furniture around. If any of your floors are uneven, fix them. If there are any pets around you, be aware of where they are. Review your medicines with your doctor. Some medicines can make you feel dizzy. This can increase your chance of falling. Ask your doctor what other things that you can do to help prevent falls. This information is not intended to replace advice given to you by your health care provider. Make sure you discuss any questions you have with your health care provider. Document Released: 09/28/2009 Document Revised: 05/09/2016 Document Reviewed: 01/06/2015 Elsevier Interactive Patient Education  2017 [employer].

## 2022-07-26 NOTE — Progress Notes (Signed)
Virtual Visit via Telephone Note  I connected with  Molly Faulkner on 07/26/22 at  3:30 PM EDT by telephone and verified that I am speaking with the correct person using two identifiers.  Location: Patient: home Provider: [facility] Persons participating in the virtual visit: [place]   I discussed the limitations, risks, security and privacy concerns of performing an evaluation and management service by telephone and the availability of in person appointments. The patient expressed understanding and agreed to proceed.  Interactive audio and video telecommunications were attempted between this nurse and patient, however failed, due to patient having technical difficulties OR patient did not have access to video capability.  We continued and completed visit with audio only.  Some vital signs may be absent or patient reported.   Dionisio David, LPN  Subjective:   Molly Faulkner is a 81 y.o. female who presents for Medicare Annual (Subsequent) preventive examination.  Review of Systems     Cardiac Risk Factors include: advanced age (>81mn, >>37women)     Objective:    There were no vitals filed for this visit. There is no height or weight on file to calculate BMI.     07/26/2022    3:34 PM 01/30/2022    3:44 PM 01/07/2022    7:07 AM 06/30/2018    9:13 AM 12/02/2017   10:00 AM 04/11/2017    1:23 PM  Advanced Directives  Does Patient Have a Medical Advance Directive? No Yes Yes Yes Yes Yes  Type of ATheatre stage managerof [place]mockLiving will Healthcare Power of [place] will  Does patient want to make changes to medical advance directive?  No - Patient declined    No - Patient declined  Copy of HBennettin Chart?    No - copy requested Yes No - copy requested  Would patient like information on creating a medical advance directive? No - Patient declined         Current Medications  (verified) Outpatient Encounter Medications as of 07/26/2022  Medication Sig   acetaminophen (TYLENOL) 500 MG tablet Take 500 mg by mouth every 8 (eight) hours as needed for moderate pain.   albuterol (VENTOLIN HFA) 108 (90 Base) MCG/ACT inhaler Inhale 2 puffs into the lungs every 4 (four) hours as needed for wheezing or shortness of breath.   atenolol (TENORMIN) 25 MG tablet TAKE 1 TABLET TWICE DAILY   cholecalciferol (VITAMIN D) 1000 units tablet Take 1,000 Units by mouth every other day. At bedtime.   CREAM BASE EX Apply 1-2 application topically 4 (four) times daily as needed (for arthritis pain.). Penetrex Inflammation Formula (Arnica/Pyridoxine/MSM/Boswellia/Cetyl Myristoleate)   cyclobenzaprine (FLEXERIL) 5 MG tablet Take 2.5 mg by mouth at bedtime.   hydrochlorothiazide (HYDRODIURIL) 25 MG tablet TAKE 1 TABLET EVERY DAY   ibuprofen (ADVIL,MOTRIN) 200 MG tablet Take 200 mg by mouth at bedtime as needed for moderate pain (for arthritis pain.).   Lidocaine 4 % PTCH Apply 1 patch topically daily as needed (pain).   losartan (COZAAR) 50 MG tablet TAKE 1/2 TABLET IN THE MORNING  AND TAKE 1/2 TABLET IN THE EVENING   Menthol-Methyl Salicylate (PAIN RELIEVING) CREA Apply 1 application topically 4 (four) times daily as needed (for arthritis pain). EFAC PAIN RELIEVING CREAM   Menthol-Methyl Salicylate (SALONPAS PAIN RELIEF PATCH EX) Apply 1 patch topically daily as needed (pain).   mirtazapine (REMERON SOL-TAB) 15 MG disintegrating tablet DISSOLVE 1 TABLET  ON  TONGUE AT BEDTIME (Patient taking differently: Take 1 mg by mouth at bedtime as needed (insomnia).)   oxyCODONE (OXY IR/ROXICODONE) 5 MG immediate release tablet Take 1 tablet by mouth every 4 (four) hours as needed.   PFIZER COVID-19 VAC BIVALENT injection    fluticasone-salmeterol (ADVAIR HFA) 115-21 MCG/ACT inhaler INHALE 2 PUFFS BY MOUTH TWICE DAILY AS NEEDED (Patient not taking: Reported on 07/26/2022)   HYDROcodone-acetaminophen  (NORCO/VICODIN) 5-325 MG tablet Take 1 tablet by mouth daily as needed for moderate pain. (Patient not taking: Reported on 07/26/2022)   methocarbamol (ROBAXIN) 500 MG tablet TAKE 1 TABLET BY MOUTH THREE TIMES DAILY FOR SPASMS (Patient not taking: Reported on 07/26/2022)   permethrin (ELIMITE) 5 % cream Apply cream whole body, from head to toe at bedtime; leave on for 8 to 12 hours (overnight), wash off next day. Repeat in 14 days if needed. (Patient not taking: Reported on 07/26/2022)   [DISCONTINUED] albuterol (VENTOLIN HFA) 108 (90 Base) MCG/ACT inhaler Inhale 2 puffs into the lungs every 6 (six) hours as needed.   [DISCONTINUED] rivaroxaban (XARELTO) 10 MG TABS tablet Take 1 tablet by mouth every morning. (Patient not taking: Reported on 07/26/2022)   [DISCONTINUED] tiZANidine (ZANAFLEX) 4 MG tablet Take 1 tablet every 6 hours by oral route. (Patient not taking: Reported on 07/26/2022)   [DISCONTINUED] traZODone (DESYREL) 50 MG tablet  (Patient not taking: Reported on 07/26/2022)   No facility-administered encounter medications on file as of 07/26/2022.    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   Sleep apnea    H/O   NO LONGER   Syncope    Past Surgical History:  Procedure Laterality Date   ABDOMINAL HYSTERECTOMY  1994   total   BREAST BIOPSY Bilateral 6962,[id number]   BREAST EXCISIONAL BIOPSY     CARPAL TUNNEL RELEASE Bilateral    CATARACT EXTRACTION W/PHACO Right 12/02/2017   Procedure:  CATARACT EXTRACTION PHACO AND INTRAOCULAR LENS PLACEMENT ([place]);  Surgeon: Birder Robson, MD;  Location: [facility] ORS;  Service: Ophthalmology;  Laterality: Right;  [place] 00:36 AP% 11.5 CDE 4.21 Fluid pack lot # [id number]   CATARACT EXTRACTION W/PHACO Left 12/30/2017   Procedure: CATARACT EXTRACTION PHACO AND INTRAOCULAR LENS PLACEMENT ([facility]);  Surgeon: Birder Robson, MD;  Location: [facility] ORS;  Service: Ophthalmology;  Laterality: Left;  [place] 00:58 AP% 11.6 CDE 6.80 Fluid pack lot # [id number]   COLONOSCOPY WITH PROPOFOL N/A 06/30/2018   Procedure: COLONOSCOPY WITH PROPOFOL;  Surgeon: Jonathon Bellows, MD;  Location: [facility] ENDOSCOPY;  Service: Gastroenterology;  Laterality: N/A;   COLONOSCOPY WITH PROPOFOL N/A 01/07/2022   Procedure: COLONOSCOPY WITH PROPOFOL;  Surgeon: Lin Landsman, MD;  Location: [facility] ENDOSCOPY;  Service: Gastroenterology;  Laterality: N/A;   DILATION AND CURETTAGE OF UTERUS     X 2   KNEE ARTHROSCOPY     Family History  Problem Relation Age of Onset  Breast cancer Mother 40   Arthritis Maternal Grandmother    Diabetes Maternal Grandmother    Social History   Socioeconomic History   Marital status: Single    Spouse name: Not on file   Number of children: Not on file   Years of education: Not on file   Highest education level: Not on file  Occupational History   Not on file  Tobacco Use   Smoking status: Former    Packs/day: 0.50    Years: 7.00    Total pack years: 3.50    Types: Cigarettes   Smokeless tobacco: Never   Tobacco comments:    quit over 40 years ago  Vaping Use   Vaping Use: Never used  Substance and Sexual Activity   Alcohol use: Yes    Alcohol/week: 4.0 standard drinks of alcohol    Types: 4 Glasses of wine per week   Drug use: No   Sexual activity: Not Currently  Other Topics Concern   Not on file  Social History Narrative   Not on file   Social Determinants of Health   Financial Resource Strain: Low Risk  (07/26/2022)   Overall  Financial Resource Strain (CARDIA)    Difficulty of Paying Living Expenses: Not hard at all  Food Insecurity: No Food Insecurity (07/26/2022)   Hunger Vital Sign    Worried About Running Out of Food in the Last Year: Never true    Ran Out of Food in the Last Year: Never true  Transportation Needs: No Transportation Needs (07/26/2022)   PRAPARE - Hydrologist (Medical): No    Lack of Transportation (Non-Medical): No  Physical Activity: Unknown (07/26/2022)   Exercise Vital Sign    Days of Exercise per Week: 0 days    Minutes of Exercise per Session: Patient refused  Stress: No Stress Concern Present (07/26/2022)   [place]    Feeling of Stress : Only a little  Social Connections: Socially Isolated (07/26/2022)   Social Connection and Isolation Panel [NHANES]    Frequency of Communication with Friends and Family: Three times a week    Frequency of Social Gatherings with Friends and Family: Once a week    Attends Religious Services: Never    Marine scientist or Organizations: No    Attends Music therapist: Never    Marital Status: Divorced    Tobacco Counseling Counseling given: Not Answered Tobacco comments: quit over 40 years ago   Clinical Intake:  Pre-visit preparation completed: Yes  Pain : No/denies pain     Nutritional Risks: None Diabetes: No  How often do you need to have someone help you when you read instructions, pamphlets, or other written materials from your doctor or pharmacy?: 1 - Never  Diabetic?NO  Interpreter Needed?: No  Information entered by :: Kirke Shaggy, LPN   Activities of Daily Living    07/26/2022    3:36 PM 04/25/2022    9:44 AM  In your present state of health, do you have any difficulty performing the following activities:  Hearing? 1 0  Vision? 0 0  Difficulty concentrating or making decisions? 0 0  Walking or climbing  stairs? 1 1  Dressing or bathing? 0 0  Doing errands, shopping? 0 0  Preparing Food and eating ? N   Using the Toilet? N   In the past six months, have you accidently leaked urine? N  Do you have problems with loss of bowel control? N   Managing your Medications? N   Managing your Finances? N   Housekeeping or managing your Housekeeping? N     Patient Care Team: Jearld Fenton, NP as PCP - General (Internal Medicine) Minna Merritts, MD as PCP - Cardiology (Cardiology) Minna Merritts, MD as Consulting Physician (Cardiology)  Indicate any recent Medical Services you may have received from other than [facility] providers in the past year (date may be approximate).     Assessment:   This is a routine wellness examination for Molly Faulkner.  Hearing/Vision screen Hearing Screening - Comments:: NO AIDS Vision Screening - Comments:: WEARS GLASSES- [facility]  Dietary issues and exercise activities discussed: Current Exercise Habits: The patient does not participate in regular exercise at present   Goals Addressed             This Visit's Progress    DIET - EAT MORE FRUITS AND VEGETABLES         Depression Screen    07/26/2022    3:31 PM 04/25/2022    9:43 AM 04/25/2022    9:29 AM 04/25/2022    9:28 AM 01/22/2022    9:50 AM 07/17/2021   10:31 AM 06/19/2021   11:06 AM  PHQ 2/9 Scores  PHQ - 2 Score 0 1 0 0 0 0 1  PHQ- 9 Score 0 3 0 2 3 0 5    Fall Risk    07/26/2022    3:35 PM 04/25/2022    9:43 AM 04/25/2022    9:29 AM 01/22/2022    9:50 AM 07/17/2021   10:32 AM  Fall Risk   Falls in the past year? 0 0 0 0 0  Number falls in past yr: 0 0 0 0 0  Injury with Fall? 0 0 0 0 0  Risk for fall due to : No Fall Risks No Fall Risks No Fall Risks No Fall Risks No Fall Risks  Follow up Falls evaluation completed Falls evaluation completed Falls evaluation completed Falls evaluation completed Falls evaluation completed    FALL RISK PREVENTION PERTAINING TO THE HOME:  Any stairs in  or around the home? No  If so, are there any without handrails? No  Home free of loose throw rugs in walkways, pet beds, electrical cords, etc? Yes  Adequate lighting in your home to reduce risk of falls? Yes   ASSISTIVE DEVICES UTILIZED TO PREVENT FALLS:  Life alert? No  Use of a cane, walker or w/c? Yes  Grab bars in the bathroom? Yes  Shower chair or bench in shower? No  Elevated toilet seat or a handicapped toilet? No    Cognitive Function:        07/26/2022    3:36 PM  6CIT Screen  What Year? 0 points  What month? 0 points  What time? 0 points  Count back from 20 0 points  Months in reverse 0 points  Repeat phrase 0 points  Total Score 0 points    Immunizations Immunization History  Administered Date(s) Administered   Fluad Quad(high Dose 65+) 09/14/2019, 09/06/2020, 09/10/2021   Influenza,inj,Quad PF,6+ Mos 09/06/2014, 08/23/2015, 09/09/2016, 09/22/2017, 09/23/2018   Influenza-Unspecified 08/16/2013   PFIZER(Purple Top)SARS-COV-2 Vaccination 01/20/2020, 02/10/2020, 09/14/2020   Pneumococcal Conjugate-13 02/14/2016   Pneumococcal Polysaccharide-23 01/17/2012   Tdap 12/17/2011   Zoster, Live 12/17/2011    TDAP status: Due, Education has been provided regarding the importance of this vaccine. Advised may receive this  vaccine at local pharmacy or Health Dept. Aware to provide a copy of the vaccination record if obtained from local pharmacy or Health Dept. Verbalized acceptance and understanding.  Flu Vaccine status: Up to date  Pneumococcal vaccine status: Up to date  Covid-19 vaccine status: Completed vaccines  Qualifies for Shingles Vaccine? Yes   Zostavax completed Yes   Shingrix Completed?: No.    Education has been provided regarding the importance of this vaccine. Patient has been advised to call insurance company to determine out of pocket expense if they have not yet received this vaccine. Advised may also receive vaccine at local pharmacy or Health Dept.  Verbalized acceptance and understanding.  Screening Tests Health Maintenance  Topic Date Due   Hepatitis C Screening  Never done   Zoster Vaccines- Shingrix (1 of 2) Never done   COVID-19 Vaccine (4 - Pfizer risk series) 11/09/2020   TETANUS/TDAP  12/16/2021   INFLUENZA VACCINE  07/16/2022   COLONOSCOPY (Pts 45-36yr Insurance coverage will need to be confirmed)  01/07/2025   Pneumonia Vaccine 81 Years old  Completed   DEXA SCAN  Completed   HPV VACCINES  Aged Out    Health Maintenance  Health Maintenance Due  Topic Date Due   Hepatitis C Screening  Never done   Zoster Vaccines- Shingrix (1 of 2) Never done   COVID-19 Vaccine (4 - Pfizer risk series) 11/09/2020   TETANUS/TDAP  12/16/2021   INFLUENZA VACCINE  07/16/2022    Colorectal cancer screening: Type of screening: Colonoscopy. Completed 01/07/22. Repeat every 3 years  Mammogram status: No longer required due to AGE.  Bone Density status: Completed 04/21/17. Results reflect: Bone density results: NORMAL. Repeat every 5 years.  Lung Cancer Screening: (Low Dose CT Chest recommended if Age 79-80years, 30 pack-year currently smoking OR have quit w/in 15years.) does not qualify.   Additional Screening:  Hepatitis C Screening: does not qualify; Completed NO  Vision Screening: Recommended annual ophthalmology exams for early detection of glaucoma and other disorders of the eye. Is the patient up to date with their annual eye exam?  Yes  Who is the provider or what is the name of the office in which the patient attends annual eye exams? [facility] If pt is not established with a provider, would they like to be referred to a provider to establish care? No .   Dental Screening: Recommended annual dental exams for proper oral hygiene  Community Resource Referral / Chronic Care Management: CRR required this visit?  No   CCM required this visit?  No      Plan:     I have personally reviewed and noted the following in  the patient's chart:   Medical and social history Use of alcohol, tobacco or illicit drugs  Current medications and supplements including opioid prescriptions.  Functional ability and status Nutritional status Physical activity Advanced directives List of other physicians Hospitalizations, surgeries, and ER visits in previous 12 months Vitals Screenings to include cognitive, depression, and falls Referrals and appointments  In addition, I have reviewed and discussed with patient certain preventive protocols, quality metrics, and best practice recommendations. A written personalized care plan for preventive services as well as general preventive health recommendations were provided to patient.     LDionisio David LPN   [id number]  Nurse Notes:

## 2022-07-28 NOTE — Progress Notes (Signed)
Cardiology Office Note  Date:  07/29/2022   ID:  Molly Faulkner, DOB 04/28/1941, MRN [id number]  PCP:  Molly Fenton, NP   Chief Complaint  Patient presents with   12 month follow up     "Doing well."  Medications reviewed by the patient verbally.     HPI:  [name] is a very pleasant 81 year old woman with history of  hypertension,  syncope,  tachycardia  OSA on CPAP Cardiac workup in 2011 at [place], echo and stress test CT coronary calcium scoring of 0 in May 2022 who presents for follow-up of her hypertension and tachycardia  Last seen by myself February 2022  Recovered from hip surgery  Since then has had chronic itching, treated for lice,  tried permethrine cream on 2 occasions, still with symptoms Now using enzymes, slowly getting better Meticulous with her linens  Tired, broken sleep at night, on a good night will get 6 hours of sleep Active, going to the gym  On 3 blood pressure medications, well controlled Atenolol, HCTZ, losartan  EKG personally reviewed by myself on todays visit Normal sinus rhythm rate 70 bpm no significant ST or T wave changes  PMH:   has a past medical history of Allergy, Arthritis, Asthma, Carpal tunnel syndrome (12/2017), Cataract of left eye (12/2017), Chicken pox, Depression, Diverticulitis, Diverticulosis, Dysrhythmia, GERD (gastroesophageal reflux disease), colonic polyps, Hyperlipidemia, Hypertension, Insomnia, 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  PSH:    Past Surgical History:  Procedure Laterality Date   ABDOMINAL HYSTERECTOMY  1994   total   BREAST BIOPSY Bilateral [id number]   BREAST EXCISIONAL BIOPSY     CARPAL TUNNEL RELEASE Bilateral    CATARACT EXTRACTION W/PHACO Right 12/02/2017   Procedure: CATARACT EXTRACTION PHACO AND INTRAOCULAR LENS PLACEMENT ([place]);  Surgeon: Molly Robson, Faulkner;  Location: [facility] ORS;  Service: Ophthalmology;  Laterality: Right;  [place] 00:36 AP% 11.5 CDE 4.21 Fluid pack lot #  [id number]   CATARACT EXTRACTION W/PHACO Left 12/30/2017   Procedure: CATARACT EXTRACTION PHACO AND INTRAOCULAR LENS PLACEMENT ([facility]);  Surgeon: Molly Robson, Faulkner;  Location: [facility] ORS;  Service: Ophthalmology;  Laterality: Left;  [place] 00:58 AP% 11.6 CDE 6.80 Fluid pack lot # [id number]   COLONOSCOPY WITH PROPOFOL N/A 06/30/2018   Procedure: COLONOSCOPY WITH PROPOFOL;  Surgeon: Molly Bellows, Faulkner;  Location: [facility] ENDOSCOPY;  Service: Gastroenterology;  Laterality: N/A;   COLONOSCOPY WITH PROPOFOL N/A 01/07/2022   Procedure: COLONOSCOPY WITH PROPOFOL;  Surgeon: Molly Landsman, Faulkner;  Location: [facility] ENDOSCOPY;  Service: Gastroenterology;  Laterality: N/A;   DILATION AND CURETTAGE OF UTERUS     X 2   KNEE ARTHROSCOPY      Current Outpatient Medications  Medication Sig Dispense Refill   acetaminophen (TYLENOL) 500 MG tablet Take 500 mg by mouth every 8 (eight) hours as needed for moderate pain.     albuterol (VENTOLIN HFA) 108 (90 Base) MCG/ACT inhaler Inhale 2 puffs into the lungs every 4 (four) hours as needed for wheezing or shortness of breath. 8 g 8   atenolol (TENORMIN) 25 MG tablet TAKE 1 TABLET TWICE DAILY 60 tablet 0   cholecalciferol (VITAMIN D) 1000 units tablet Take 1,000 Units by mouth every other day. At bedtime.     CREAM BASE EX Apply 1-2 application topically 4 (four) times daily as needed (for arthritis pain.). Penetrex Inflammation Formula (Arnica/Pyridoxine/MSM/Boswellia/Cetyl Myristoleate)     cyclobenzaprine (FLEXERIL) 5 MG tablet Take 2.5 mg by mouth at bedtime.     hydrochlorothiazide (  HYDRODIURIL) 25 MG tablet TAKE 1 TABLET EVERY DAY 90 tablet 0   ibuprofen (ADVIL,MOTRIN) 200 MG tablet Take 200 mg by mouth at bedtime as needed for moderate pain (for arthritis pain.).     Lidocaine 4 % PTCH Apply 1 patch topically daily as needed (pain).     losartan (COZAAR) 50 MG tablet TAKE 1/2 TABLET IN THE MORNING  AND TAKE 1/2 TABLET IN THE EVENING 90 tablet 0   Menthol-Methyl Salicylate  (PAIN RELIEVING) CREA Apply 1 application topically 4 (four) times daily as needed (for arthritis pain). EFAC PAIN RELIEVING CREAM     Menthol-Methyl Salicylate (SALONPAS PAIN RELIEF PATCH EX) Apply 1 patch topically daily as needed (pain).     methocarbamol (ROBAXIN) 500 MG tablet      mirtazapine (REMERON SOL-TAB) 15 MG disintegrating tablet DISSOLVE 1 TABLET  ON  TONGUE AT BEDTIME 20 tablet 0   oxyCODONE (OXY IR/ROXICODONE) 5 MG immediate release tablet Take 1 tablet by mouth every 4 (four) hours as needed.     permethrin (ELIMITE) 5 % cream Apply cream whole body, from head to toe at bedtime; leave on for 8 to 12 hours (overnight), wash off next day. Repeat in 14 days if needed. 60 g 1   fluticasone-salmeterol (ADVAIR HFA) 115-21 MCG/ACT inhaler INHALE 2 PUFFS BY MOUTH TWICE DAILY AS NEEDED (Patient not taking: Reported on 07/26/2022)     HYDROcodone-acetaminophen (NORCO/VICODIN) 5-325 MG tablet Take 1 tablet by mouth daily as needed for moderate pain. (Patient not taking: Reported on 07/26/2022) 15 tablet 0   PFIZER COVID-19 VAC BIVALENT injection  (Patient not taking: Reported on 07/29/2022)     No current facility-administered medications for this visit.    Allergies:   Avelox [moxifloxacin hcl in nacl], Trazodone and nefazodone, and Clarithromycin   Social History:  The patient  reports that she has quit smoking. Her smoking use included cigarettes. She has a 3.50 pack-year smoking history. She has never used smokeless tobacco. She reports current alcohol use of about 4.0 standard drinks of alcohol per week. She reports that she does not use drugs.   Family History:   family history includes Arthritis in her maternal grandmother; Breast cancer (age of onset: 71) in her mother; Diabetes in her maternal grandmother.   Review of Systems: Review of Systems  Constitutional: Negative.   Respiratory: Negative.    Cardiovascular: Negative.   Gastrointestinal: Negative.   Musculoskeletal:  Negative.   Neurological: Negative.   Psychiatric/Behavioral: Negative.    All other systems reviewed and are negative.   PHYSICAL EXAM: VS:  BP 120/72 (BP Location: Left Arm, Patient Position: Sitting, Cuff Size: Normal)   Pulse 70   Ht 5' (1.524 m)   Wt 169 lb 2 oz (76.7 kg)   SpO2 99%   BMI 33.03 kg/m  , BMI Body mass index is 33.03 kg/m. Constitutional:  oriented to person, place, and time. No distress.  HENT:  Head: Grossly normal Eyes:  no discharge. No scleral icterus.  Neck: No JVD, no carotid bruits  Cardiovascular: Regular rate and rhythm, no murmurs appreciated Pulmonary/Chest: Clear to auscultation bilaterally, no wheezes or rails Abdominal: Soft.  no distension.  no tenderness.  Musculoskeletal: Normal range of motion Neurological:  normal muscle tone. Coordination normal. No atrophy Skin: Skin warm and dry Psychiatric: normal affect, pleasant   Recent Labs: 01/22/2022: TSH 2.18    Lipid Panel Lab Results  Component Value Date   CHOL 187 07/17/2021   HDL 61 07/17/2021  LDLCALC 109 (H) 07/17/2021   TRIG 78 07/17/2021      Wt Readings from Last 3 Encounters:  07/29/22 169 lb 2 oz (76.7 kg)  07/26/22 180 lb (81.6 kg)  04/25/22 180 lb (81.6 kg)      ASSESSMENT AND PLAN:   Essential hypertension - Plan: EKG 12-Lead Blood pressure is well controlled on today's visit. No changes made to the medications.  Pure hypercholesterolemia Currently not on any cholesterol medication. Calcium score of 0, Denies anginal symptoms, no further work-up needed at this time  OSA  On CPAP, stable  Chronic itching On enzymes Managed by primary care   Orders Placed This Encounter  Procedures   EKG 12-Lead     Signed, Esmond Plants, M.D., Ph.D. 07/29/2022  [place], [place]

## 2022-07-29 ENCOUNTER — Encounter: Payer: Self-pay | Admitting: Cardiovascular Disease

## 2022-07-29 ENCOUNTER — Ambulatory Visit: Payer: Medicare HMO | Admitting: Cardiovascular Disease

## 2022-07-29 VITALS — BP 120/72 | HR 70 | Ht 60.0 in | Wt 169.1 lb

## 2022-07-29 DIAGNOSIS — G4733 Obstructive sleep apnea (adult) (pediatric): Secondary | ICD-10-CM | POA: Diagnosis not present

## 2022-07-29 DIAGNOSIS — I10 Essential (primary) hypertension: Secondary | ICD-10-CM

## 2022-07-29 DIAGNOSIS — Z0100 Encounter for examination of eyes and vision without abnormal findings: Secondary | ICD-10-CM | POA: Diagnosis not present

## 2022-07-29 DIAGNOSIS — E782 Mixed hyperlipidemia: Secondary | ICD-10-CM

## 2022-07-29 DIAGNOSIS — Z9989 Dependence on other enabling machines and devices: Secondary | ICD-10-CM | POA: Diagnosis not present

## 2022-07-29 DIAGNOSIS — Z961 Presence of intraocular lens: Secondary | ICD-10-CM | POA: Diagnosis not present

## 2022-07-29 NOTE — Patient Instructions (Signed)
Medication Instructions:  No changes  If you need a refill on your cardiac medications before your next appointment, please call your pharmacy.   Lab work: No new labs needed  Testing/Procedures: No new testing needed  Follow-Up: At [facility], you and your health needs are our priority.  As part of our continuing mission to provide you with exceptional heart care, we have created designated Provider Care Teams.  These Care Teams include your primary Cardiologist (physician) and Advanced Practice Providers (APPs -  Physician Assistants and Nurse Practitioners) who all work together to provide you with the care you need, when you need it.  You will need a follow up appointment as needed  Providers on your designated Care Team:   Christopher Berge, NP Ryan Dunn, PA-C Cadence Furth, PA-C  COVID-19 Vaccine Information can be found at: [email or web address] For questions related to vaccine distribution or appointments, please email [email or web address] or call [phone].    

## 2022-08-23 ENCOUNTER — Other Ambulatory Visit: Payer: Self-pay | Admitting: Cardiovascular Disease

## 2022-09-04 ENCOUNTER — Other Ambulatory Visit: Payer: Medicare HMO

## 2022-09-05 ENCOUNTER — Encounter: Payer: Self-pay | Admitting: Internal Medicine

## 2022-09-05 ENCOUNTER — Ambulatory Visit (INDEPENDENT_AMBULATORY_CARE_PROVIDER_SITE_OTHER): Payer: Medicare HMO | Admitting: Internal Medicine

## 2022-09-05 VITALS — BP 136/70 | HR 62 | Temp 97.1°F | Ht 61.0 in | Wt 170.0 lb

## 2022-09-05 DIAGNOSIS — E6609 Other obesity due to excess calories: Secondary | ICD-10-CM | POA: Insufficient documentation

## 2022-09-05 DIAGNOSIS — L299 Pruritus, unspecified: Secondary | ICD-10-CM

## 2022-09-05 DIAGNOSIS — Z6832 Body mass index (BMI) 32.0-32.9, adult: Secondary | ICD-10-CM

## 2022-09-05 DIAGNOSIS — E785 Hyperlipidemia, unspecified: Secondary | ICD-10-CM | POA: Diagnosis not present

## 2022-09-05 DIAGNOSIS — Z1159 Encounter for screening for other viral diseases: Secondary | ICD-10-CM

## 2022-09-05 DIAGNOSIS — Z1231 Encounter for screening mammogram for malignant neoplasm of breast: Secondary | ICD-10-CM | POA: Diagnosis not present

## 2022-09-05 DIAGNOSIS — Z0001 Encounter for general adult medical examination with abnormal findings: Secondary | ICD-10-CM

## 2022-09-05 MED ORDER — PERMETHRIN 5 % EX CREA: TOPICAL_CREAM | CUTANEOUS | 1 refills | Status: DC

## 2022-09-05 MED ORDER — CYCLOBENZAPRINE HCL 5 MG PO TABS: 2.5000 mg | ORAL_TABLET | Freq: Every day | ORAL | 0 refills | Status: DC

## 2022-09-05 NOTE — Patient Instructions (Signed)
Health Maintenance for Postmenopausal Women Menopause is a normal process in which your ability to get pregnant comes to an end. This process happens slowly over many months or years, usually between the ages of 48 and 55. Menopause is complete when you have missed your menstrual period for 12 months. It is important to talk with your health care provider about some of the most common conditions that affect women after menopause (postmenopausal women). These include heart disease, cancer, and bone loss (osteoporosis). Adopting a healthy lifestyle and getting preventive care can help to promote your health and wellness. The actions you take can also lower your chances of developing some of these common conditions. What are the signs and symptoms of menopause? During menopause, you may have the following symptoms: Hot flashes. These can be moderate or severe. Night sweats. Decrease in sex drive. Mood swings. Headaches. Tiredness (fatigue). Irritability. Memory problems. Problems falling asleep or staying asleep. Talk with your health care provider about treatment options for your symptoms. Do I need hormone replacement therapy? Hormone replacement therapy is effective in treating symptoms that are caused by menopause, such as hot flashes and night sweats. Hormone replacement carries certain risks, especially as you become older. If you are thinking about using estrogen or estrogen with progestin, discuss the benefits and risks with your health care provider. How can I reduce my risk for heart disease and stroke? The risk of heart disease, heart attack, and stroke increases as you age. One of the causes may be a change in the body's hormones during menopause. This can affect how your body uses dietary fats, triglycerides, and cholesterol. Heart attack and stroke are medical emergencies. There are many things that you can do to help prevent heart disease and stroke. Watch your blood pressure High  blood pressure causes heart disease and increases the risk of stroke. This is more likely to develop in people who have high blood pressure readings or are overweight. Have your blood pressure checked: Every 3-5 years if you are 18-39 years of age. Every year if you are 40 years old or older. Eat a healthy diet  Eat a diet that includes plenty of vegetables, fruits, low-fat dairy products, and lean protein. Do not eat a lot of foods that are high in solid fats, added sugars, or sodium. Get regular exercise Get regular exercise. This is one of the most important things you can do for your health. Most adults should: Try to exercise for at least 150 minutes each week. The exercise should increase your heart rate and make you sweat (moderate-intensity exercise). Try to do strengthening exercises at least twice each week. Do these in addition to the moderate-intensity exercise. Spend less time sitting. Even light physical activity can be beneficial. Other tips Work with your health care provider to achieve or maintain a healthy weight. Do not use any products that contain nicotine or tobacco. These products include cigarettes, chewing tobacco, and vaping devices, such as e-cigarettes. If you need help quitting, ask your health care provider. Know your numbers. Ask your health care provider to check your cholesterol and your blood sugar (glucose). Continue to have your blood tested as directed by your health care provider. Do I need screening for cancer? Depending on your health history and family history, you may need to have cancer screenings at different stages of your life. This may include screening for: Breast cancer. Cervical cancer. Lung cancer. Colorectal cancer. What is my risk for osteoporosis? After menopause, you may be   at increased risk for osteoporosis. Osteoporosis is a condition in which bone destruction happens more quickly than new bone creation. To help prevent osteoporosis or  the bone fractures that can happen because of osteoporosis, you may take the following actions: If you are 19-50 years old, get at least 1,000 mg of calcium and at least 600 international units (IU) of vitamin D per day. If you are older than age 50 but younger than age 70, get at least 1,200 mg of calcium and at least 600 international units (IU) of vitamin D per day. If you are older than age 70, get at least 1,200 mg of calcium and at least 800 international units (IU) of vitamin D per day. Smoking and drinking excessive alcohol increase the risk of osteoporosis. Eat foods that are rich in calcium and vitamin D, and do weight-bearing exercises several times each week as directed by your health care provider. How does menopause affect my mental health? Depression may occur at any age, but it is more common as you become older. Common symptoms of depression include: Feeling depressed. Changes in sleep patterns. Changes in appetite or eating patterns. Feeling an overall lack of motivation or enjoyment of activities that you previously enjoyed. Frequent crying spells. Talk with your health care provider if you think that you are experiencing any of these symptoms. General instructions See your health care provider for regular wellness exams and vaccines. This may include: Scheduling regular health, dental, and eye exams. Getting and maintaining your vaccines. These include: Influenza vaccine. Get this vaccine each year before the flu season begins. Pneumonia vaccine. Shingles vaccine. Tetanus, diphtheria, and pertussis (Tdap) booster vaccine. Your health care provider may also recommend other immunizations. Tell your health care provider if you have ever been abused or do not feel safe at home. Summary Menopause is a normal process in which your ability to get pregnant comes to an end. This condition causes hot flashes, night sweats, decreased interest in sex, mood swings, headaches, or lack  of sleep. Treatment for this condition may include hormone replacement therapy. Take actions to keep yourself healthy, including exercising regularly, eating a healthy diet, watching your weight, and checking your blood pressure and blood sugar levels. Get screened for cancer and depression. Make sure that you are up to date with all your vaccines. This information is not intended to replace advice given to you by your health care provider. Make sure you discuss any questions you have with your health care provider. Document Revised: 04/23/2021 Document Reviewed: 04/23/2021 Elsevier Patient Education  2023 Elsevier Inc.  

## 2022-09-05 NOTE — Progress Notes (Signed)
Subjective:    Patient ID: Molly Faulkner, female    DOB: 1941-05-19, 81 y.o.   MRN: [id number]  HPI  Patient presents to clinic today for her annual exam.  She also reports mite infestation.  Flu: 08/2021 Tetanus: 12/2011 COVID: Pfizer x3 Pneumovax: 01/2012 Prevnar: 02/2016 Shingrix: Never Zostavax: 12/2011 Pap smear: Hysterectomy Mammogram: 08/2020 Bone density: 04/2017, scheduled 10/2022 Colon screening: 12/2021 Vision screening: annually Dentist: biannually  Diet: She does eat meat. She consumes more veggies than fruits. She does eat some fried foods. She drinks mostly water, Body Armour. 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     albuterol (VENTOLIN HFA) 108 (90 Base) MCG/ACT inhaler Inhale 2 puffs into the lungs every 4 (four) hours as needed for wheezing or shortness of breath. 8 g 8   atenolol (TENORMIN) 25 MG tablet TAKE 1 TABLET TWICE DAILY 60 tablet 10   cholecalciferol (VITAMIN D) 1000 units tablet Take 1,000 Units by mouth every other day. At bedtime.     CREAM BASE EX Apply 1-2 application topically 4 (four) times daily as needed (for arthritis pain.). Penetrex Inflammation Formula (Arnica/Pyridoxine/MSM/Boswellia/Cetyl Myristoleate)     cyclobenzaprine (FLEXERIL) 5 MG tablet Take 2.5 mg by mouth at bedtime.      fluticasone-salmeterol (ADVAIR HFA) 115-21 MCG/ACT inhaler INHALE 2 PUFFS BY MOUTH TWICE DAILY AS NEEDED (Patient not taking: Reported on 07/26/2022)     hydrochlorothiazide (HYDRODIURIL) 25 MG tablet TAKE 1 TABLET EVERY DAY 90 tablet 0   HYDROcodone-acetaminophen (NORCO/VICODIN) 5-325 MG tablet Take 1 tablet by mouth daily as needed for moderate pain. (Patient not taking: Reported on 07/26/2022) 15 tablet 0   ibuprofen (ADVIL,MOTRIN) 200 MG tablet Take 200 mg by mouth at bedtime as needed for moderate pain (for arthritis pain.).     Lidocaine 4 % PTCH Apply 1 patch topically daily as needed (pain).     losartan (COZAAR) 50 MG tablet TAKE 1/2 TABLET IN THE MORNING  AND TAKE 1/2 TABLET IN THE EVENING 90 tablet 0   Menthol-Methyl Salicylate (PAIN RELIEVING) CREA Apply 1 application topically 4 (four) times daily as needed (for arthritis pain). EFAC PAIN RELIEVING CREAM     Menthol-Methyl Salicylate (SALONPAS PAIN RELIEF PATCH EX) Apply 1 patch topically daily as needed (pain).     methocarbamol (ROBAXIN) 500 MG tablet      mirtazapine (REMERON SOL-TAB) 15 MG disintegrating tablet DISSOLVE 1 TABLET  ON  TONGUE AT BEDTIME 20 tablet 0   oxyCODONE (OXY IR/ROXICODONE) 5 MG immediate release tablet Take 1 tablet by mouth every 4 (four) hours as needed.     permethrin (ELIMITE) 5 % cream Apply  cream whole body, from head to toe at bedtime; leave on for 8 to 12 hours (overnight), wash off next day. Repeat in 14 days if needed. 60 g 1   PFIZER COVID-19 VAC BIVALENT injection  (Patient not taking: Reported on 07/29/2022)     No current facility-administered medications for this visit.    Allergies  Allergen Reactions   Avelox [Moxifloxacin Hcl In Nacl] Other (See Comments)    Chills   Trazodone And Nefazodone Swelling   Clarithromycin Other (See Comments)    Unsure of reaction type    Family History  Problem Relation Age of Onset   Breast cancer Mother 82   Arthritis Maternal Grandmother    Diabetes  Maternal Grandmother     Social History   Socioeconomic History   Marital status: Single    Spouse name: Not on file   Number of children: Not on file   Years of education: Not on file   Highest education level: Not on file  Occupational History   Not on file  Tobacco Use   Smoking status: Former    Packs/day: 0.50    Years: 7.00    Total pack years: 3.50    Types: Cigarettes   Smokeless tobacco: Never   Tobacco comments:    quit over 40 years ago  Vaping Use   Vaping Use: Never used  Substance and Sexual Activity   Alcohol use: Yes    Alcohol/week: 4.0 standard drinks of alcohol    Types: 4 Glasses of wine per week   Drug use: No   Sexual activity: Not Currently  Other Topics Concern   Not on file  Social History Narrative   Not on file   Social Determinants of Health   Financial Resource Strain: Low Risk  (07/26/2022)   Overall Financial Resource Strain (CARDIA)    Difficulty of Paying Living Expenses: Not hard at all  Food Insecurity: No Food Insecurity (07/26/2022)   Hunger Vital Sign    Worried About Running Out of Food in the Last Year: Never true    Ran Out of Food in the Last Year: Never true  Transportation Needs: No Transportation Needs (07/26/2022)   PRAPARE - Hydrologist (Medical): No    Lack of Transportation (Non-Medical): No  Physical Activity: Unknown (07/26/2022)   Exercise Vital Sign    Days of Exercise per Week: 0 days    Minutes of Exercise per Session: Patient refused  Stress: No Stress Concern Present (07/26/2022)   [place]    Feeling of Stress : Only a little  Social Connections: Socially Isolated (07/26/2022)   Social Connection and Isolation Panel [NHANES]    Frequency of Communication with Friends and Family: Three times a week    Frequency of Social Gatherings with Friends and Family: Once a week    Attends Religious Services: Never     Marine scientist or Organizations: No    Attends Archivist Meetings: Never    Marital Status: Divorced  Human resources officer Violence: Not At Risk (07/26/2022)   Humiliation, Afraid, Rape, and Kick questionnaire    Fear of Current or Ex-Partner: No    Emotionally Abused: No    Physically Abused: No    Sexually Abused: No     Constitutional: Denies fever, malaise, fatigue, headache or abrupt weight changes.  HEENT: Denies eye pain, eye redness, ear pain, ringing in the ears, wax buildup,  runny nose, nasal congestion, bloody nose, or sore throat. Respiratory: Denies difficulty breathing, shortness of breath, cough or sputum production.   Cardiovascular: Denies chest pain, chest tightness, palpitations or swelling in the hands or feet.  Gastrointestinal: Denies abdominal pain, bloating, constipation, diarrhea or blood in the stool.  GU: Denies urgency, frequency, pain with urination, burning sensation, blood in urine, odor or discharge. Musculoskeletal: Patient reports chronic joint pain.  Denies decrease in range of motion, difficulty with gait, muscle pain or joint swelling.  Skin: Pt reports itchy skin. Denies redness, rashes, lesions or ulcercations.  Neurological: Patient reports insomnia.  Denies dizziness, difficulty with memory, difficulty with speech or problems with balance and coordination.  Psych: Patient has a history of depression.  Denies anxiety, SI/HI.  No other specific complaints in a complete review of systems (except as listed in HPI above).     Objective:   Physical Exam  BP 136/70 (BP Location: Left Arm, Patient Position: Sitting, Cuff Size: Normal)   Pulse 62   Temp (!) 97.1 F (36.2 C) (Temporal)   Ht 5' 1"  (1.549 m)   Wt 170 lb (77.1 kg)   SpO2 99%   BMI 32.12 kg/m   Wt Readings from Last 3 Encounters:  07/29/22 169 lb 2 oz (76.7 kg)  07/26/22 180 lb (81.6 kg)  04/25/22 180 lb (81.6 kg)    General: Appears her stated age,  obese, in  NAD. Skin: Warm, dry and intact. No rashes, lesions or ulcerations noted. HEENT: Head: normal shape and size; Eyes: sclera white, no icterus, conjunctiva pink, PERRLA and EOMs intact;  Neck:  Neck supple, trachea midline. No masses, lumps or thyromegaly present.  Cardiovascular: Normal rate and rhythm. S1,S2 noted.  No murmur, rubs or gallops noted. No JVD or BLE edema. No carotid bruits noted. Pulmonary/Chest: Normal effort and positive vesicular breath sounds. No respiratory distress. No wheezes, rales or ronchi noted.  Abdomen: Normal bowel sounds. Musculoskeletal: Strength 5/5 BUE/BLE. No difficulty with gait.  Neurological: Alert and oriented. Cranial nerves II-XII grossly intact. Coordination normal.  Psychiatric: Mood and affect normal. Behavior is normal. Judgment and thought content normal.    BMET    Component Value Date/Time   NA 141 07/17/2021 1052   NA 140 01/10/2020 0950   K 4.0 07/17/2021 1052   CL 107 07/17/2021 1052   CO2 28 07/17/2021 1052   GLUCOSE 97 07/17/2021 1052   BUN 14 07/17/2021 1052   BUN 15 01/10/2020 0950   CREATININE 0.81 07/17/2021 1052   CALCIUM 10.0 07/17/2021 1052   GFRNONAA 64 01/10/2020 0950   GFRAA 74 01/10/2020 0950    Lipid Panel     Component Value Date/Time   CHOL 187 07/17/2021 1052   CHOL 203 (H) 01/10/2020 0950   TRIG 78 07/17/2021 1052   HDL 61 07/17/2021 1052   HDL 63 01/10/2020 0950   CHOLHDL 3.1 07/17/2021 1052   VLDL 9 03/22/2020 1139   LDLCALC 109 (H) 07/17/2021 1052    CBC    Component Value Date/Time   WBC 5.2 07/17/2021 1052   RBC 4.05 07/17/2021 1052   HGB 13.1 07/17/2021 1052   HGB 13.9 01/10/2020 0950   HCT 38.7 07/17/2021 1052   HCT 40.1 01/10/2020 0950   PLT 210 07/17/2021 1052   PLT 227 01/10/2020 0950   MCV 95.6 07/17/2021 1052   MCV 97 01/10/2020 0950   MCH 32.3 07/17/2021 1052   MCHC 33.9 07/17/2021 1052   RDW 12.4 07/17/2021 1052  RDW 12.3 01/10/2020 0950   LYMPHSABS 1.0 01/10/2020 0950    EOSABS 0.3 01/10/2020 0950   BASOSABS 0.0 01/10/2020 0950    Hgb A1C Lab Results  Component Value Date   HGBA1C 5.1 01/22/2022           Assessment & Plan:   Preventative Health Maintenance:  She declines Flu shot today She declines tetanus for financial reasons, advised her if she gets bit or cut to get this done Encourage her to get her COVID booster Pneumovax and Prevnar UTD Zostavax UTD Discussed Shingrix vaccine, she will check coverage with her insurance company and schedule a visit at the pharmacy to get this done She no longer needs Pap smears Mammogram ordered-she will call to schedule Bone density scheduled Colon screening UTD Encouraged her to consume a balanced diet and exercise regimen Advised her to see an eye doctor and dentist annually We will check CBC, c-Met, lipid and hep C today  Pruritis:  She is concerned about mite infestation but I do not see evidence and evidence of this I did a skin scraping which did not reveal anything but epithelial cells Rx for Permethrin 5% cream-use as directed Refer to dermatology for further evaluation and treatment  RTC in 6 months, follow-up chronic conditions Webb Silversmith, NP

## 2022-09-05 NOTE — Assessment & Plan Note (Signed)
Encourage diet and exercise for weight loss 

## 2022-09-06 LAB — LIPID PANEL
Cholesterol: 167 mg/dL (ref <200)
HDL: 54 mg/dL (ref >=50)
LDL Cholesterol (Calc): 99 mg/dL (calc)
Non-HDL Cholesterol (Calc): 113 mg/dL (calc) (ref <130)
Total CHOL/HDL Ratio: 3.1 (calc) (ref <5.0)
Triglycerides: 56 mg/dL (ref <150)

## 2022-09-06 LAB — COMPLETE METABOLIC PANEL WITH GFR
AG Ratio: 1.8 (calc) (ref 1.0–2.5)
ALT: 14 U/L (ref 6–29)
AST: 20 U/L (ref 10–35)
Albumin: 4.2 g/dL (ref 3.6–5.1)
Alkaline phosphatase (APISO): 53 U/L (ref 37–153)
BUN: 15 mg/dL (ref 7–25)
CO2: 28 mmol/L (ref 20–32)
Calcium: 10.6 mg/dL — ABNORMAL HIGH (ref 8.6–10.4)
Chloride: 104 mmol/L (ref 98–110)
Creat: 0.82 mg/dL (ref 0.60–1.00)
Globulin: 2.3 g/dL (calc) (ref 1.9–3.7)
Glucose, Bld: 95 mg/dL (ref 65–99)
Potassium: 3.8 mmol/L (ref 3.5–5.3)
Sodium: 139 mmol/L (ref 135–146)
Total Bilirubin: 1.3 mg/dL — ABNORMAL HIGH (ref 0.2–1.2)
Total Protein: 6.5 g/dL (ref 6.1–8.1)
eGFR: 73 mL/min/{1.73_m2} (ref >=60)

## 2022-09-06 LAB — CBC
HCT: 36.5 % (ref 35.0–45.0)
Hemoglobin: 12.6 g/dL (ref 11.7–15.5)
MCH: 33.3 pg — ABNORMAL HIGH (ref 27.0–33.0)
MCHC: 34.5 g/dL (ref 32.0–36.0)
MCV: 96.6 fL (ref 80.0–100.0)
MPV: 11.5 fL (ref 7.5–12.5)
Platelets: 212 10*3/uL (ref 140–400)
RBC: 3.78 10*6/uL — ABNORMAL LOW (ref 3.80–5.10)
RDW: 12.1 % (ref 11.0–15.0)
WBC: 6.3 10*3/uL (ref 3.8–10.8)

## 2022-09-06 LAB — HEPATITIS C ANTIBODY: Hepatitis C Ab: NONREACTIVE

## 2022-09-12 DIAGNOSIS — T887XXA Unspecified adverse effect of drug or medicament, initial encounter: Secondary | ICD-10-CM | POA: Diagnosis not present

## 2022-09-12 DIAGNOSIS — L501 Idiopathic urticaria: Secondary | ICD-10-CM | POA: Diagnosis not present

## 2022-09-13 ENCOUNTER — Other Ambulatory Visit: Payer: Self-pay | Admitting: Cardiovascular Disease

## 2022-09-13 ENCOUNTER — Other Ambulatory Visit: Payer: Self-pay | Admitting: Internal Medicine

## 2022-09-13 NOTE — Telephone Encounter (Signed)
Requested Prescriptions  Pending Prescriptions Disp Refills  . hydrochlorothiazide (HYDRODIURIL) 25 MG tablet [Pharmacy Med Name: HYDROCHLOROTHIAZIDE 25 MG Tablet] 90 tablet 0    Sig: TAKE 1 TABLET EVERY DAY (PATIENT WILL NEED TO SCHEDULE AN OFFICE VISIT FOR FURTHER REFILLS)     Cardiovascular: Diuretics - Thiazide Passed - 09/13/2022  2:37 AM      Passed - Cr in normal range and within 180 days    Creat  Date Value Ref Range Status  09/05/2022 0.82 0.60 - 1.00 mg/dL Final         Passed - K in normal range and within 180 days    Potassium  Date Value Ref Range Status  09/05/2022 3.8 3.5 - 5.3 mmol/L Final         Passed - Na in normal range and within 180 days    Sodium  Date Value Ref Range Status  09/05/2022 139 135 - 146 mmol/L Final  01/10/2020 140 134 - 144 mmol/L Final         Passed - Last BP in normal range    BP Readings from Last 1 Encounters:  09/05/22 136/70         Passed - Valid encounter within last 6 months    Recent Outpatient Visits          1 week ago Encounter for general adult medical examination with abnormal findings   [facility], Coralie Keens, NP   4 months ago [facility] [place], [place] W, NP   7 months ago Polyneuropathy associated with underlying disease [facility])   [facility] [place], Coralie Keens, NP   1 year ago Medicare annual wellness visit, subsequent   [facility] [place], Coralie Keens, NP   1 year ago Insect bite of chest, unspecified laterality, initial encounter   [facility] [place], Coralie Keens, NP

## 2022-10-17 ENCOUNTER — Ambulatory Visit
Admission: RE | Admit: 2022-10-17 | Discharge: 2022-10-17 | Disposition: A | Discharge status: Home / Self Care | Payer: Medicare HMO | Source: Ambulatory Visit | Attending: Internal Medicine | Admitting: Internal Medicine

## 2022-10-17 DIAGNOSIS — M8588 Other specified disorders of bone density and structure, other site: Secondary | ICD-10-CM | POA: Diagnosis not present

## 2022-10-17 DIAGNOSIS — Z780 Asymptomatic menopausal state: Secondary | ICD-10-CM | POA: Diagnosis not present

## 2022-10-18 ENCOUNTER — Ambulatory Visit (INDEPENDENT_AMBULATORY_CARE_PROVIDER_SITE_OTHER): Payer: Medicare HMO

## 2022-10-18 DIAGNOSIS — Z23 Encounter for immunization: Secondary | ICD-10-CM

## 2022-10-20 ENCOUNTER — Encounter: Payer: Self-pay | Admitting: Internal Medicine

## 2022-10-21 ENCOUNTER — Encounter: Payer: Self-pay | Admitting: Internal Medicine

## 2022-11-18 DIAGNOSIS — M5451 Vertebrogenic low back pain: Secondary | ICD-10-CM | POA: Diagnosis not present

## 2022-11-18 DIAGNOSIS — M9903 Segmental and somatic dysfunction of lumbar region: Secondary | ICD-10-CM | POA: Diagnosis not present

## 2022-12-02 DIAGNOSIS — M5451 Vertebrogenic low back pain: Secondary | ICD-10-CM | POA: Diagnosis not present

## 2022-12-02 DIAGNOSIS — M9903 Segmental and somatic dysfunction of lumbar region: Secondary | ICD-10-CM | POA: Diagnosis not present

## 2022-12-11 DIAGNOSIS — Z961 Presence of intraocular lens: Secondary | ICD-10-CM | POA: Diagnosis not present

## 2023-01-02 DIAGNOSIS — T887XXA Unspecified adverse effect of drug or medicament, initial encounter: Secondary | ICD-10-CM | POA: Diagnosis not present

## 2023-01-02 DIAGNOSIS — T887XXD Unspecified adverse effect of drug or medicament, subsequent encounter: Secondary | ICD-10-CM | POA: Diagnosis not present

## 2023-01-02 DIAGNOSIS — L501 Idiopathic urticaria: Secondary | ICD-10-CM | POA: Diagnosis not present

## 2023-01-03 DIAGNOSIS — M9903 Segmental and somatic dysfunction of lumbar region: Secondary | ICD-10-CM | POA: Diagnosis not present

## 2023-01-03 DIAGNOSIS — M5451 Vertebrogenic low back pain: Secondary | ICD-10-CM | POA: Diagnosis not present

## 2023-01-23 DIAGNOSIS — H0289 Other specified disorders of eyelid: Secondary | ICD-10-CM | POA: Diagnosis not present

## 2023-01-23 DIAGNOSIS — H04123 Dry eye syndrome of bilateral lacrimal glands: Secondary | ICD-10-CM | POA: Diagnosis not present

## 2023-01-30 ENCOUNTER — Encounter: Payer: Self-pay | Admitting: Internal Medicine

## 2023-01-30 ENCOUNTER — Ambulatory Visit: Payer: Medicare HMO | Admitting: Internal Medicine

## 2023-01-30 VITALS — BP 130/76 | HR 57 | Temp 97.8°F | Ht 61.0 in | Wt 171.2 lb

## 2023-01-30 DIAGNOSIS — J4520 Mild intermittent asthma, uncomplicated: Secondary | ICD-10-CM | POA: Diagnosis not present

## 2023-01-30 DIAGNOSIS — G4733 Obstructive sleep apnea (adult) (pediatric): Secondary | ICD-10-CM | POA: Diagnosis not present

## 2023-01-30 NOTE — Patient Instructions (Addendum)
Continue CPAP as prescribed  Use inhalers as needed  Avoid secondhand smoke Avoid SICK contacts Recommend  Masking  when appropriate Recommend Keep up-to-date with vaccinations

## 2023-01-30 NOTE — Progress Notes (Signed)
$@Patienty$  ID: Molly Faulkner, female    DOB: 1941-10-25, 82 y.o.   MRN: [id number]  SYNOPSIS 82 year old female, former smoker (3.5-pack-year history).  History significant for HTN, chronic seasonal allergies, hyperlipidemia, asthma and OSA.      CC Follow up OSA Follow up ASTHMA  HPI:  This patient doing extremely well with CPAP therapy 100% compliance for days and greater than 4 hours  AHI significantly reduced patient uses nasal pillows she definitely benefits from CPAP therapy Patient doing extremely well with CPAP therapy Patient is a benefits from therapy Patient uses nasal pillows CPAP 10   No exacerbation at this time No evidence of heart failure at this time No evidence or signs of infection at this time No respiratory distress No fevers, chills, nausea, vomiting, diarrhea No evidence of lower extremity edema No evidence hemoptysis    Asthma is well controlled No maintenance inhalers at this time Use albuterol only as needed   Allergies  Allergen Reactions   Avelox [Moxifloxacin Hcl In Nacl] Other (See Comments)    Chills   Trazodone And Nefazodone Swelling   Clarithromycin Other (See Comments)    Unsure of reaction type    Immunization History  Administered Date(s) Administered   Fluad Quad(high Dose 65+) 09/14/2019, 09/06/2020, 09/10/2021, 10/18/2022   Influenza,inj,Quad PF,6+ Mos 09/06/2014, 08/23/2015, 09/09/2016, 09/22/2017, 09/23/2018   Influenza-Unspecified 08/16/2013   PFIZER(Purple Top)SARS-COV-2 Vaccination 01/20/2020, 02/10/2020, 09/14/2020   Pneumococcal Conjugate-13 02/14/2016   Pneumococcal Polysaccharide-23 01/17/2012   Tdap 12/17/2011   Zoster, Live 12/17/2011    Past Medical History:  Diagnosis Date   Allergy    Arthritis    Asthma    Carpal tunnel syndrome 12/2017   left, referred to neurology for EMG testing   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/2017   Chicken pox    Depression    Diverticulitis    Diverticulosis    Dysrhythmia     paroxysmal tachycardia   GERD (gastroesophageal reflux disease)    Hx of colonic polyps    Hyperlipidemia    Hypertension    Insomnia    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     albuterol (VENTOLIN HFA) 108 (90 Base) MCG/ACT inhaler Inhale 2 puffs into the lungs every 4 (four) hours as needed for wheezing or shortness of breath. 8 g 8   atenolol (TENORMIN) 25 MG tablet TAKE 1 TABLET TWICE DAILY 60 tablet 10   cholecalciferol (VITAMIN D) 1000 units tablet Take 1,000 Units by mouth every other day. At bedtime.     CREAM BASE EX Apply 1-2 application topically 4 (four) times daily as needed (for arthritis pain.). Penetrex Inflammation Formula (Arnica/Pyridoxine/MSM/Boswellia/Cetyl Myristoleate)     cyclobenzaprine (FLEXERIL) 5 MG tablet Take 0.5 tablets (2.5 mg total) by mouth at bedtime. 30 tablet 0   hydrochlorothiazide (HYDRODIURIL) 25 MG tablet TAKE 1 TABLET EVERY DAY (PATIENT WILL NEED  TO SCHEDULE AN OFFICE VISIT FOR FURTHER REFILLS) 90 tablet 0   ibuprofen (ADVIL,MOTRIN) 200 MG tablet Take 200 mg by mouth at bedtime as needed for moderate pain (for arthritis pain.).     Lidocaine 4 % PTCH Apply 1 patch topically daily as needed (pain).     losartan (COZAAR) 50 MG tablet TAKE 1/2 TABLET IN THE MORNING  AND TAKE 1/2 TABLET IN THE EVENING 90 tablet 3   Menthol-Methyl Salicylate (PAIN RELIEVING) CREA Apply 1 application topically 4 (four) times daily as needed (for arthritis  pain). EFAC PAIN RELIEVING CREAM     methocarbamol (ROBAXIN) 500 MG tablet      mirtazapine (REMERON SOL-TAB) 15 MG disintegrating tablet DISSOLVE 1 TABLET  ON  TONGUE AT BEDTIME 20 tablet 0   oxyCODONE (OXY IR/ROXICODONE) 5 MG immediate release tablet Take 1 tablet by mouth every 4 (four) hours as needed.     permethrin (ELIMITE) 5 % cream Apply cream whole body, from head to toe at bedtime; leave on for 8 to 12 hours (overnight), wash off next day. Repeat in 14 days if needed. 60 g 1   No facility-administered medications prior to visit.      Physical Exam BP 130/76 (BP Location: Left Arm, Cuff Size: Normal)   Pulse (!) 57   Temp 97.8 F (36.6 C) (Temporal)   Ht 5' 1"$  (1.549 m)   Wt 171 lb 3.2 oz (77.7 kg)   SpO2 100%   BMI 32.35 kg/m      Review of Systems: Gen:  Denies  fever, sweats, chills weight loss  HEENT: Denies blurred vision, double vision, ear pain, eye pain, hearing loss, nose bleeds, sore throat Cardiac:  No dizziness, chest pain or heaviness, chest tightness,edema, No JVD Resp:   No cough, -sputum production, -shortness of breath,-wheezing, -hemoptysis,  Other:  All other systems negative    Physical Examination:   General Appearance: No distress  EYES PERRLA, EOM intact.   NECK Supple, No JVD Pulmonary: normal breath sounds, No wheezing.  CardiovascularNormal S1,S2.  No m/r/g.   Abdomen: Benign, Soft, non-tender. ALL OTHER ROS ARE NEGATIVE  CBC    Component Value Date/Time   WBC 6.3 09/05/2022 1408   RBC 3.78 (L) 09/05/2022 1408   HGB 12.6 09/05/2022 1408   HGB 13.9 01/10/2020 0950   HCT 36.5 09/05/2022 1408   HCT 40.1 01/10/2020 0950   PLT 212 09/05/2022 1408   PLT 227 01/10/2020 0950   MCV 96.6 09/05/2022 1408   MCV 97 01/10/2020 0950   MCH 33.3 (H) 09/05/2022 1408   MCHC 34.5 09/05/2022 1408   RDW 12.1 09/05/2022 1408   RDW 12.3 01/10/2020 0950   LYMPHSABS 1.0 01/10/2020 0950   EOSABS 0.3 01/10/2020 0950   BASOSABS 0.0 01/10/2020 0950     BMET    Component Value Date/Time   NA 139 09/05/2022 1408   NA 140 01/10/2020 0950   K 3.8 09/05/2022 1408   CL 104 09/05/2022 1408   CO2 28 09/05/2022 1408   GLUCOSE 95 09/05/2022 1408   BUN 15 09/05/2022 1408   BUN 15 01/10/2020 0950   CREATININE 0.82 09/05/2022 1408   CALCIUM 10.6 (H) 09/05/2022 1408   GFRNONAA 64 01/10/2020 0950   GFRAA 74 01/10/2020 0950      Assessment & Plan:   OSA on CPAP Compliance report reviewed in detail with the patient 100% compliance with reduced AHI Patient with excellent compliance OSA well-controlled Uses and benefits from therpay  Asthma,  mild intermittent well-controlled No exacerbation at this time No evidence or signs of infection at this time No evidence hemoptysis Advised to use inhalers as needed  Clinically stable;  She has no acute symptoms or recent exacerbations. Rarely requires SABA   MEDICATION ADJUSTMENTS/LABS AND TESTS ORDERED: Continue CPAP as prescribed Avoid secondhand Avoid agents  avoid sick contacts   CURRENT MEDICATIONS REVIEWED AT LENGTH WITH PATIENT TODAY   Patient  satisfied with Plan of action and management. All questions answered  Follow up  1 year  Total Time Spent  21 mins   Meria Crilly Patricia Pesa, M.D.  [facility]  Medical Director [place] Director [facility] Cardio-Pulmonary Department

## 2023-01-31 DIAGNOSIS — M5451 Vertebrogenic low back pain: Secondary | ICD-10-CM | POA: Diagnosis not present

## 2023-01-31 DIAGNOSIS — M9903 Segmental and somatic dysfunction of lumbar region: Secondary | ICD-10-CM | POA: Diagnosis not present

## 2023-02-09 ENCOUNTER — Other Ambulatory Visit: Payer: Self-pay | Admitting: Internal Medicine

## 2023-02-10 NOTE — Telephone Encounter (Signed)
Patient will need an office visit for further refills. Requested Prescriptions  Pending Prescriptions Disp Refills   hydrochlorothiazide (HYDRODIURIL) 25 MG tablet [Pharmacy Med Name: HYDROCHLOROTHIAZIDE 25 MG Tablet] 90 tablet 0    Sig: TAKE 1 TABLET EVERY DAY (WILL NEED TO SCHEDULE AN OFFICE VISIT FOR FURTHER REFILLS)     Cardiovascular: Diuretics - Thiazide Passed - 02/09/2023  4:27 AM      Passed - Cr in normal range and within 180 days    Creat  Date Value Ref Range Status  09/05/2022 0.82 0.60 - 1.00 mg/dL Final         Passed - K in normal range and within 180 days    Potassium  Date Value Ref Range Status  09/05/2022 3.8 3.5 - 5.3 mmol/L Final         Passed - Na in normal range and within 180 days    Sodium  Date Value Ref Range Status  09/05/2022 139 135 - 146 mmol/L Final  01/10/2020 140 134 - 144 mmol/L Final         Passed - Last BP in normal range    BP Readings from Last 1 Encounters:  01/30/23 130/76         Passed - Valid encounter within last 6 months    Recent Outpatient Visits           5 months ago Encounter for general adult medical examination with abnormal findings   [facility] [place], Coralie Keens, NP   9 months ago [facility] [place], Coralie Keens, NP   1 year ago Polyneuropathy associated with underlying disease [facility])   [facility] [place], Coralie Keens, NP   1 year ago Medicare annual wellness visit, subsequent   [facility] [place], Coralie Keens, NP   1 year ago Insect bite of chest, unspecified laterality, initial encounter   [facility] [place], Coralie Keens, NP

## 2023-04-07 ENCOUNTER — Encounter: Payer: Self-pay | Admitting: Internal Medicine

## 2023-04-07 ENCOUNTER — Ambulatory Visit (INDEPENDENT_AMBULATORY_CARE_PROVIDER_SITE_OTHER): Payer: Medicare HMO | Admitting: Internal Medicine

## 2023-04-07 VITALS — BP 130/74 | HR 54 | Temp 99.1°F | Resp 18 | Ht 61.0 in | Wt 172.6 lb

## 2023-04-07 DIAGNOSIS — K219 Gastro-esophageal reflux disease without esophagitis: Secondary | ICD-10-CM | POA: Diagnosis not present

## 2023-04-07 DIAGNOSIS — F329 Major depressive disorder, single episode, unspecified: Secondary | ICD-10-CM | POA: Diagnosis not present

## 2023-04-07 DIAGNOSIS — M25511 Pain in right shoulder: Secondary | ICD-10-CM

## 2023-04-07 DIAGNOSIS — M479 Spondylosis, unspecified: Secondary | ICD-10-CM

## 2023-04-07 DIAGNOSIS — J4540 Moderate persistent asthma, uncomplicated: Secondary | ICD-10-CM

## 2023-04-07 DIAGNOSIS — G4733 Obstructive sleep apnea (adult) (pediatric): Secondary | ICD-10-CM | POA: Diagnosis not present

## 2023-04-07 DIAGNOSIS — I10 Essential (primary) hypertension: Secondary | ICD-10-CM | POA: Diagnosis not present

## 2023-04-07 DIAGNOSIS — E6609 Other obesity due to excess calories: Secondary | ICD-10-CM

## 2023-04-07 DIAGNOSIS — R7309 Other abnormal glucose: Secondary | ICD-10-CM

## 2023-04-07 DIAGNOSIS — Z6832 Body mass index (BMI) 32.0-32.9, adult: Secondary | ICD-10-CM

## 2023-04-07 DIAGNOSIS — E782 Mixed hyperlipidemia: Secondary | ICD-10-CM | POA: Diagnosis not present

## 2023-04-07 DIAGNOSIS — G8929 Other chronic pain: Secondary | ICD-10-CM

## 2023-04-07 DIAGNOSIS — F5101 Primary insomnia: Secondary | ICD-10-CM

## 2023-04-07 MED ORDER — CYCLOBENZAPRINE HCL 5 MG PO TABS: 2.5000 mg | ORAL_TABLET | Freq: Every day | ORAL | 0 refills | Status: DC

## 2023-04-07 NOTE — Assessment & Plan Note (Signed)
Cyclobenzaprine and hydrocodone refilled today

## 2023-04-07 NOTE — Assessment & Plan Note (Signed)
Encourage regular stretching and core strengthening Cyclobenzaprine and hydrocodone refilled today

## 2023-04-07 NOTE — Assessment & Plan Note (Signed)
C-Met and lipid profile today °Encouraged her to consume a low-fat diet °

## 2023-04-07 NOTE — Assessment & Plan Note (Signed)
Avoid foods that trigger reflux Encourage weight loss as this can help reduce reflux symptoms Okay to take Tums OTC as needed 

## 2023-04-07 NOTE — Assessment & Plan Note (Signed)
Continue albuterol as needed 

## 2023-04-07 NOTE — Assessment & Plan Note (Signed)
Controlled on losartan, HCTZ and atenolol Reinforced DASH diet and exercise for weight loss C-Met today

## 2023-04-07 NOTE — Assessment & Plan Note (Signed)
Encourage diet and exercise for weight loss 

## 2023-04-07 NOTE — Progress Notes (Signed)
Subjective:    Patient ID: Molly Faulkner, female    DOB: 17-Oct-1941, 82 y.o.   MRN: [id number]  HPI  Patient presents to clinic today for follow-up of chronic conditions.  OA: Mainly in her hands and back.  She takes Cyclobenzaprine as needed with some relief of symptoms.  She follows with [facility].  Asthma: She denies chronic cough or shortness of breath.  She is using Albuterol as needed.  There are no PFTs on file.  She follows with pulmonology.  Depression: Chronic, managed on Mirtazapine.  She is not currently seeing her therapist.  She denies anxiety, SI/HI.  GERD: Triggered by acidic foods.  She is not currently taking any medications for this.  There is no upper GI on file.  HLD: Her last LDL was 99, triglycerides 56, 08/2022.  She is not taking any cholesterol-lowering medication at this time.  She tries to consume low-fat diet.  HTN: Her BP today is 151/74.  She is taking Losartan, HCTZ and Atenolol as prescribed.  ECG from 07/2022 reviewed.  Insomnia/OSA: She averages hours of sleep per night with the use of her CPAP.  She takes Mirtazapine as needed with some relief of symptoms.  Sleep study from 08/2016 reviewed.  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     albuterol (VENTOLIN HFA) 108 (90 Base) MCG/ACT inhaler Inhale 2 puffs into  the lungs every 4 (four) hours as needed for wheezing or shortness of breath. 8 g 8   atenolol (TENORMIN) 25 MG tablet TAKE 1 TABLET TWICE DAILY 60 tablet 10   cholecalciferol (VITAMIN D) 1000 units tablet Take 1,000 Units by mouth every other day. At bedtime.     CREAM BASE EX Apply 1-2 application topically 4 (four) times daily as needed (for arthritis pain.). Penetrex Inflammation Formula (Arnica/Pyridoxine/MSM/Boswellia/Cetyl Myristoleate)     cyclobenzaprine (FLEXERIL) 5 MG tablet Take 0.5 tablets (2.5 mg total) by mouth at bedtime. 30 tablet 0   hydrochlorothiazide (HYDRODIURIL) 25 MG tablet TAKE 1 TABLET EVERY DAY (WILL NEED TO SCHEDULE AN OFFICE VISIT FOR FURTHER REFILLS) 90 tablet 0   ibuprofen (ADVIL,MOTRIN) 200 MG tablet Take 200 mg by mouth at bedtime as needed for moderate pain (for arthritis pain.).     Lidocaine 4 % PTCH Apply 1 patch topically daily as needed (pain).     losartan (COZAAR) 50 MG tablet TAKE 1/2 TABLET IN THE MORNING  AND TAKE 1/2 TABLET IN THE EVENING 90 tablet 3   Menthol-Methyl Salicylate (PAIN RELIEVING) CREA Apply 1 application topically 4 (four) times daily as needed (for arthritis pain). EFAC PAIN RELIEVING CREAM     methocarbamol (ROBAXIN)  500 MG tablet      mirtazapine (REMERON SOL-TAB) 15 MG disintegrating tablet DISSOLVE 1 TABLET  ON  TONGUE AT BEDTIME 20 tablet 0   oxyCODONE (OXY IR/ROXICODONE) 5 MG immediate release tablet Take 1 tablet by mouth every 4 (four) hours as needed.     No current facility-administered medications for this visit.    Allergies  Allergen Reactions   Avelox [Moxifloxacin Hcl In Nacl] Other (See Comments)    Chills   Trazodone And Nefazodone Swelling   Clarithromycin Other (See Comments)    Unsure of reaction type    Family History  Problem Relation Age of Onset   Breast cancer Mother 61   Arthritis Maternal Grandmother    Diabetes Maternal Grandmother     Social History   Socioeconomic History   Marital status:  Single    Spouse name: Not on file   Number of children: Not on file   Years of education: Not on file   Highest education level: Not on file  Occupational History   Not on file  Tobacco Use   Smoking status: Former    Packs/day: 0.50    Years: 7.00    Additional pack years: 0.00    Total pack years: 3.50    Types: Cigarettes   Smokeless tobacco: Never   Tobacco comments:    quit over 40 years ago  Vaping Use   Vaping Use: Never used  Substance and Sexual Activity   Alcohol use: Yes    Alcohol/week: 4.0 standard drinks of alcohol    Types: 4 Glasses of wine per week   Drug use: No   Sexual activity: Not Currently  Other Topics Concern   Not on file  Social History Narrative   Not on file   Social Determinants of Health   Financial Resource Strain: Low Risk  (07/26/2022)   Overall Financial Resource Strain (CARDIA)    Difficulty of Paying Living Expenses: Not hard at all  Food Insecurity: No Food Insecurity (07/26/2022)   Hunger Vital Sign    Worried About Running Out of Food in the Last Year: Never true    Ran Out of Food in the Last Year: Never true  Transportation Needs: No Transportation Needs (07/26/2022)   PRAPARE - Administrator, Civil Service (Medical): No    Lack of Transportation (Non-Medical): No  Physical Activity: Unknown (07/26/2022)   Exercise Vital Sign    Days of Exercise per Week: 0 days    Minutes of Exercise per Session: Patient declined  Stress: No Stress Concern Present (07/26/2022)   [employer] of Occupational Health - Occupational Stress Questionnaire    Feeling of Stress : Only a little  Social Connections: Socially Isolated (07/26/2022)   Social Connection and Isolation Panel [NHANES]    Frequency of Communication with Friends and Family: Three times a week    Frequency of Social Gatherings with Friends and Family: Once a week    Attends Religious Services: Never    Database administrator or Organizations: No    Attends  Banker Meetings: Never    Marital Status: Divorced  Catering manager Violence: Not At Risk (07/26/2022)   Humiliation, Afraid, Rape, and Kick questionnaire    Fear of Current or Ex-Partner: No    Emotionally Abused: No    Physically Abused: No    Sexually Abused: No     Constitutional: Denies fever, malaise, fatigue, headache or abrupt weight changes.  HEENT: Denies eye  pain, eye redness, ear pain, ringing in the ears, wax buildup, runny nose, nasal congestion, bloody nose, or sore throat. Respiratory: Denies difficulty breathing, shortness of breath, cough or sputum production.   Cardiovascular: Patient reports intermittent swelling of LLE.  Denies chest pain, chest tightness, palpitations or swelling in the hands.  Gastrointestinal: Denies abdominal pain, bloating, constipation, diarrhea or blood in the stool.  GU: Denies urgency, frequency, pain with urination, burning sensation, blood in urine, odor or discharge. Musculoskeletal: Denies decrease in range of motion, difficulty with gait, muscle pain or joint pain and swelling.  Skin: Denies redness, rashes, lesions or ulcercations.  Neurological: Patient reports insomnia intermittent paresthesias of lower extremities.  Denies dizziness, difficulty with memory, difficulty with speech or problems with balance and coordination.  Psych: Patient has a history of depression.  Denies anxiety, SI/HI.  No other specific complaints in a complete review of systems (except as listed in HPI above).   Objective:   Physical Exam BP (!) 151/74 (BP Location: Right Arm, Patient Position: Sitting, Cuff Size: Normal)   Pulse (!) 54   Temp 99.1 F (37.3 C) (Oral)   Resp 18   Ht 5\' 1"  (1.549 m)   Wt 172 lb 9.6 oz (78.3 kg)   SpO2 100%   BMI 32.61 kg/m   Wt Readings from Last 3 Encounters:  01/30/23 171 lb 3.2 oz (77.7 kg)  09/05/22 170 lb (77.1 kg)  07/29/22 169 lb 2 oz (76.7 kg)    General: Appears her stated age, obese, in  NAD. Skin: Warm, dry and intact. No rashes noted. HEENT: Head: normal shape and size; Eyes: sclera white, no icterus, conjunctiva pink, PERRLA and EOMs intact;  Cardiovascular: Bradycardic with normal rhythm. S1,S2 noted.  No murmur, rubs or gallops noted. No JVD or BLE edema. No carotid bruits noted. Pulmonary/Chest: Normal effort and positive vesicular breath sounds. No respiratory distress. No wheezes, rales or ronchi noted.  Abdomen: Normal bowel sounds.  Musculoskeletal: No difficulty with gait.  Neurological: Alert and oriented. Coordination normal.  Psychiatric: Mood and affect normal. Behavior is normal. Judgment and thought content normal.     BMET    Component Value Date/Time   NA 139 09/05/2022 1408   NA 140 01/10/2020 0950   K 3.8 09/05/2022 1408   CL 104 09/05/2022 1408   CO2 28 09/05/2022 1408   GLUCOSE 95 09/05/2022 1408   BUN 15 09/05/2022 1408   BUN 15 01/10/2020 0950   CREATININE 0.82 09/05/2022 1408   CALCIUM 10.6 (H) 09/05/2022 1408   GFRNONAA 64 01/10/2020 0950   GFRAA 74 01/10/2020 0950    Lipid Panel     Component Value Date/Time   CHOL 167 09/05/2022 1408   CHOL 203 (H) 01/10/2020 0950   TRIG 56 09/05/2022 1408   HDL 54 09/05/2022 1408   HDL 63 01/10/2020 0950   CHOLHDL 3.1 09/05/2022 1408   VLDL 9 03/22/2020 1139   LDLCALC 99 09/05/2022 1408    CBC    Component Value Date/Time   WBC 6.3 09/05/2022 1408   RBC 3.78 (L) 09/05/2022 1408   HGB 12.6 09/05/2022 1408   HGB 13.9 01/10/2020 0950   HCT 36.5 09/05/2022 1408   HCT 40.1 01/10/2020 0950   PLT 212 09/05/2022 1408   PLT 227 01/10/2020 0950   MCV 96.6 09/05/2022 1408   MCV 97 01/10/2020 0950   MCH 33.3 (H) 09/05/2022 1408   MCHC 34.5 09/05/2022 1408   RDW 12.1 09/05/2022 1408   RDW  12.3 01/10/2020 0950   LYMPHSABS 1.0 01/10/2020 0950   EOSABS 0.3 01/10/2020 0950   BASOSABS 0.0 01/10/2020 0950    Hgb A1C Lab Results  Component Value Date   HGBA1C 5.1 01/22/2022             Assessment & Plan:      RTC in 6 months for annual exam Nicki Reaper, NP

## 2023-04-07 NOTE — Assessment & Plan Note (Signed)
Controlled on mirtazapine

## 2023-04-07 NOTE — Assessment & Plan Note (Signed)
Controlled on mirtazapine 

## 2023-04-07 NOTE — Assessment & Plan Note (Signed)
Encourage weight loss as this can help reduce sleep apnea symptoms Continue CPAP use 

## 2023-04-07 NOTE — Patient Instructions (Signed)

## 2023-04-08 LAB — CBC
HCT: 37.6 % (ref 35.0–45.0)
Hemoglobin: 12.8 g/dL (ref 11.7–15.5)
MCH: 32.4 pg (ref 27.0–33.0)
MCHC: 34 g/dL (ref 32.0–36.0)
MCV: 95.2 fL (ref 80.0–100.0)
MPV: 11.6 fL (ref 7.5–12.5)
Platelets: 217 10*3/uL (ref 140–400)
RBC: 3.95 10*6/uL (ref 3.80–5.10)
RDW: 11.9 % (ref 11.0–15.0)
WBC: 5.5 10*3/uL (ref 3.8–10.8)

## 2023-04-08 LAB — COMPLETE METABOLIC PANEL WITH GFR
AG Ratio: 1.6 (calc) (ref 1.0–2.5)
ALT: 13 U/L (ref 6–29)
AST: 17 U/L (ref 10–35)
Albumin: 4.2 g/dL (ref 3.6–5.1)
Alkaline phosphatase (APISO): 74 U/L (ref 37–153)
BUN: 23 mg/dL (ref 7–25)
CO2: 29 mmol/L (ref 20–32)
Calcium: 11.1 mg/dL — ABNORMAL HIGH (ref 8.6–10.4)
Chloride: 107 mmol/L (ref 98–110)
Creat: 0.85 mg/dL (ref 0.60–1.00)
Globulin: 2.7 g/dL (calc) (ref 1.9–3.7)
Glucose, Bld: 86 mg/dL (ref 65–99)
Potassium: 4.4 mmol/L (ref 3.5–5.3)
Sodium: 140 mmol/L (ref 135–146)
Total Bilirubin: 1 mg/dL (ref 0.2–1.2)
Total Protein: 6.9 g/dL (ref 6.1–8.1)
eGFR: 70 mL/min/{1.73_m2} (ref >=60)

## 2023-04-08 LAB — HEMOGLOBIN A1C
Hgb A1c MFr Bld: 5 % of total Hgb (ref <5.7)
Mean Plasma Glucose: 97 mg/dL
eAG (mmol/L): 5.4 mmol/L

## 2023-04-08 LAB — LIPID PANEL
Cholesterol: 163 mg/dL (ref <200)
HDL: 62 mg/dL (ref >=50)
LDL Cholesterol (Calc): 87 mg/dL (calc)
Non-HDL Cholesterol (Calc): 101 mg/dL (calc) (ref <130)
Total CHOL/HDL Ratio: 2.6 (calc) (ref <5.0)
Triglycerides: 56 mg/dL (ref <150)

## 2023-04-10 ENCOUNTER — Encounter: Payer: Self-pay | Admitting: Internal Medicine

## 2023-04-11 DIAGNOSIS — M9903 Segmental and somatic dysfunction of lumbar region: Secondary | ICD-10-CM | POA: Diagnosis not present

## 2023-04-11 DIAGNOSIS — M5451 Vertebrogenic low back pain: Secondary | ICD-10-CM | POA: Diagnosis not present

## 2023-04-14 ENCOUNTER — Ambulatory Visit (INDEPENDENT_AMBULATORY_CARE_PROVIDER_SITE_OTHER): Payer: Medicare HMO | Admitting: Internal Medicine

## 2023-04-14 ENCOUNTER — Encounter: Payer: Self-pay | Admitting: Internal Medicine

## 2023-04-14 VITALS — BP 124/62 | HR 58 | Temp 96.9°F | Wt 171.0 lb

## 2023-04-14 DIAGNOSIS — B852 Pediculosis, unspecified: Secondary | ICD-10-CM

## 2023-04-14 MED ORDER — PERMETHRIN 5 % EX CREA: TOPICAL_CREAM | CUTANEOUS | 1 refills | Status: DC

## 2023-04-14 NOTE — Progress Notes (Signed)
Subjective:    Patient ID: Molly Faulkner, female    DOB: 1941-08-09, 82 y.o.   MRN: [id number]  HPI  Pt presents to the clinic today with concern for lice. She noticed this a few days ago. She has brought some of the bugs with her. She had this problem last year. She has had to have her house fumigated for the same.  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     albuterol (VENTOLIN HFA) 108 (90 Base) MCG/ACT inhaler Inhale 2 puffs into the lungs every 4 (four) hours as needed for wheezing or shortness of breath. 8 g 8   atenolol (TENORMIN) 25 MG tablet TAKE 1 TABLET TWICE DAILY 60 tablet 10   b complex vitamins capsule Take 1 capsule by mouth daily.     cholecalciferol (VITAMIN D) 1000 units tablet Take 1,000 Units by mouth every other day. At bedtime.     CREAM BASE EX Apply 1-2 application topically 4 (four) times daily as needed (for arthritis pain.). Penetrex Inflammation Formula (Arnica/Pyridoxine/MSM/Boswellia/Cetyl Myristoleate)     cyclobenzaprine (FLEXERIL) 5 MG tablet Take 0.5 tablets (2.5 mg total) by mouth at bedtime. 30 tablet 0   hydrochlorothiazide (HYDRODIURIL) 25 MG tablet TAKE 1 TABLET EVERY DAY (WILL NEED TO SCHEDULE AN OFFICE VISIT FOR FURTHER REFILLS) 90 tablet 0   HYDROcodone-acetaminophen (NORCO/VICODIN) 5-325 MG tablet Take 0.5  tablets by mouth every 6 (six) hours as needed for moderate pain.     ibuprofen (ADVIL,MOTRIN) 200 MG tablet Take 200 mg by mouth at bedtime as needed for moderate pain (for arthritis pain.).     Lidocaine 4 % PTCH Apply 1 patch topically daily as needed (pain).     losartan (COZAAR) 50 MG tablet TAKE 1/2 TABLET IN THE MORNING  AND TAKE 1/2 TABLET IN THE EVENING 90 tablet 3   mirtazapine (REMERON SOL-TAB) 15 MG disintegrating tablet DISSOLVE 1 TABLET  ON  TONGUE AT BEDTIME 20 tablet 0   No current facility-administered medications for this visit.    Allergies  Allergen Reactions   Avelox [Moxifloxacin Hcl In Nacl] Other (See Comments)    Chills   Trazodone And Nefazodone Swelling   Clarithromycin Other (See Comments)    Unsure of reaction type    Family History  Problem Relation Age of Onset   Breast cancer Mother 17   Arthritis Maternal Grandmother    Diabetes Maternal Grandmother     Social History   Socioeconomic History   Marital status: Single    Spouse name: Not on file   Number of children: Not on file   Years of education: Not on file  Highest education level: Not on file  Occupational History   Not on file  Tobacco Use   Smoking status: Former    Packs/day: 0.50    Years: 7.00    Additional pack years: 0.00    Total pack years: 3.50    Types: Cigarettes   Smokeless tobacco: Never   Tobacco comments:    quit over 40 years ago  Vaping Use   Vaping Use: Never used  Substance and Sexual Activity   Alcohol use: Yes    Alcohol/week: 4.0 standard drinks of alcohol    Types: 4 Glasses of wine per week   Drug use: No   Sexual activity: Not Currently  Other Topics Concern   Not on file  Social History Narrative   Not on file   Social Determinants of Health   Financial Resource Strain: Low Risk  (07/26/2022)   Overall Financial Resource Strain (CARDIA)    Difficulty of Paying Living Expenses: Not hard at all  Food Insecurity: No Food Insecurity (07/26/2022)    Hunger Vital Sign    Worried About Running Out of Food in the Last Year: Never true    Ran Out of Food in the Last Year: Never true  Transportation Needs: No Transportation Needs (07/26/2022)   PRAPARE - Administrator, Civil Service (Medical): No    Lack of Transportation (Non-Medical): No  Physical Activity: Unknown (07/26/2022)   Exercise Vital Sign    Days of Exercise per Week: 0 days    Minutes of Exercise per Session: Patient declined  Stress: No Stress Concern Present (07/26/2022)   [employer] of Occupational Health - Occupational Stress Questionnaire    Feeling of Stress : Only a little  Social Connections: Socially Isolated (07/26/2022)   Social Connection and Isolation Panel [NHANES]    Frequency of Communication with Friends and Family: Three times a week    Frequency of Social Gatherings with Friends and Family: Once a week    Attends Religious Services: Never    Database administrator or Organizations: No    Attends Banker Meetings: Never    Marital Status: Divorced  Catering manager Violence: Not At Risk (07/26/2022)   Humiliation, Afraid, Rape, and Kick questionnaire    Fear of Current or Ex-Partner: No    Emotionally Abused: No    Physically Abused: No    Sexually Abused: No     Constitutional: Denies fever, malaise, fatigue, headache or abrupt weight changes.  Respiratory: Denies difficulty breathing, shortness of breath, cough or sputum production.   Cardiovascular: Denies chest pain, chest tightness, palpitations or swelling in the hands or feet.  Skin: Pt reports bugs in scalp. Denies redness, rashes, lesions or ulcercations.   No other specific complaints in a complete review of systems (except as listed in HPI above).  Objective:   Physical Exam  BP 124/62 (BP Location: Left Arm, Patient Position: Sitting, Cuff Size: Large)   Pulse (!) 58   Temp (!) 96.9 F (36.1 C) (Temporal)   Wt 171 lb (77.6 kg)   SpO2 99%   BMI  32.31 kg/m   Wt Readings from Last 3 Encounters:  04/07/23 172 lb 9.6 oz (78.3 kg)  01/30/23 171 lb 3.2 oz (77.7 kg)  09/05/22 170 lb (77.1 kg)    General: Appears her stated age, obese, in NAD. HEENT: Head: normal shape and size, I do not see any live lice or nits in scalp;  Cardiovascular: Bradycardic with normal rhythm.  Pulmonary/Chest: Normal effort and positive vesicular breath sounds. No respiratory distress. No wheezes, rales or ronchi noted.  Neurological: Alert and oriented.   BMET    Component Value Date/Time   NA 140 04/07/2023 1442   NA 140 01/10/2020 0950   K 4.4 04/07/2023 1442   CL 107 04/07/2023 1442   CO2 29 04/07/2023 1442   GLUCOSE 86 04/07/2023 1442   BUN 23 04/07/2023 1442   BUN 15 01/10/2020 0950   CREATININE 0.85 04/07/2023 1442   CALCIUM 11.1 (H) 04/07/2023 1442   GFRNONAA 64 01/10/2020 0950   GFRAA 74 01/10/2020 0950    Lipid Panel     Component Value Date/Time   CHOL 163 04/07/2023 1442   CHOL 203 (H) 01/10/2020 0950   TRIG 56 04/07/2023 1442   HDL 62 04/07/2023 1442   HDL 63 01/10/2020 0950   CHOLHDL 2.6 04/07/2023 1442   VLDL 9 03/22/2020 1139   LDLCALC 87 04/07/2023 1442    CBC    Component Value Date/Time   WBC 5.5 04/07/2023 1442   RBC 3.95 04/07/2023 1442   HGB 12.8 04/07/2023 1442   HGB 13.9 01/10/2020 0950   HCT 37.6 04/07/2023 1442   HCT 40.1 01/10/2020 0950   PLT 217 04/07/2023 1442   PLT 227 01/10/2020 0950   MCV 95.2 04/07/2023 1442   MCV 97 01/10/2020 0950   MCH 32.4 04/07/2023 1442   MCHC 34.0 04/07/2023 1442   RDW 11.9 04/07/2023 1442   RDW 12.3 01/10/2020 0950   LYMPHSABS 1.0 01/10/2020 0950   EOSABS 0.3 01/10/2020 0950   BASOSABS 0.0 01/10/2020 0950    Hgb A1C Lab Results  Component Value Date   HGBA1C 5.0 04/07/2023     '    Assessment & Plan:   Lice:  I took a look at the bugs she brought in under the microscope They do look like lice RX for permethrin 5% treatment, repeat in 14 days if  needed Wash all belongings in hot water  RTC in 6 months for your annual exam Nicki Reaper, NP

## 2023-04-16 DIAGNOSIS — F458 Other somatoform disorders: Secondary | ICD-10-CM | POA: Diagnosis not present

## 2023-04-16 DIAGNOSIS — L309 Dermatitis, unspecified: Secondary | ICD-10-CM | POA: Diagnosis not present

## 2023-04-24 ENCOUNTER — Encounter: Payer: Self-pay | Admitting: Internal Medicine

## 2023-04-25 DIAGNOSIS — Z48817 Encounter for surgical aftercare following surgery on the skin and subcutaneous tissue: Secondary | ICD-10-CM | POA: Diagnosis not present

## 2023-05-23 DIAGNOSIS — M5451 Vertebrogenic low back pain: Secondary | ICD-10-CM | POA: Diagnosis not present

## 2023-05-23 DIAGNOSIS — M9903 Segmental and somatic dysfunction of lumbar region: Secondary | ICD-10-CM | POA: Diagnosis not present

## 2023-06-06 ENCOUNTER — Other Ambulatory Visit: Payer: Self-pay | Admitting: Cardiovascular Disease

## 2023-06-06 ENCOUNTER — Other Ambulatory Visit: Payer: Self-pay | Admitting: Internal Medicine

## 2023-06-06 NOTE — Telephone Encounter (Signed)
Please contact pt for future appointment. Pt overdue for 12 month f/u. 

## 2023-06-09 ENCOUNTER — Telehealth: Payer: Self-pay | Admitting: Cardiovascular Disease

## 2023-06-09 NOTE — Telephone Encounter (Signed)
Left voice mail to schedule appt

## 2023-06-09 NOTE — Telephone Encounter (Signed)
Requested Prescriptions  Pending Prescriptions Disp Refills   hydrochlorothiazide (HYDRODIURIL) 25 MG tablet [Pharmacy Med Name: HYDROCHLOROTHIAZIDE 25 MG Tablet] 90 tablet 0    Sig: TAKE 1 TABLET EVERY DAY (WILL NEED TO SCHEDULE AN OFFICE VISIT FOR FURTHER REFILLS)     Cardiovascular: Diuretics - Thiazide Passed - 06/06/2023  3:30 PM      Passed - Cr in normal range and within 180 days    Creat  Date Value Ref Range Status  04/07/2023 0.85 0.60 - 1.00 mg/dL Final         Passed - K in normal range and within 180 days    Potassium  Date Value Ref Range Status  04/07/2023 4.4 3.5 - 5.3 mmol/L Final         Passed - Na in normal range and within 180 days    Sodium  Date Value Ref Range Status  04/07/2023 140 135 - 146 mmol/L Final  01/10/2020 140 134 - 144 mmol/L Final         Passed - Last BP in normal range    BP Readings from Last 1 Encounters:  04/14/23 124/62         Passed - Valid encounter within last 6 months    Recent Outpatient Visits           1 month ago [employer]   [place] [facility] [place], Salvadore Oxford, NP   2 months ago Mixed hyperlipidemia   [facility] [place], Salvadore Oxford, NP   9 months ago Encounter for general adult medical examination with abnormal findings   [place] [facility] [place], Salvadore Oxford, NP   1 year ago [employer]   [place] [facility] [place], Salvadore Oxford, NP   1 year ago Polyneuropathy associated with underlying disease [facility])   [place] [facility] [place], Salvadore Oxford, NP       Future Appointments             In 3 months [place], Salvadore Oxford, NP  [facility]

## 2023-06-10 NOTE — Telephone Encounter (Signed)
Pt is scheduled on 6/27

## 2023-06-11 NOTE — Progress Notes (Unsigned)
Cardiology Office Note  Date:  06/12/2023   ID:  Molly, Faulkner November 28, 1941, MRN [id number]  PCP:  Lorre Munroe, NP   Chief Complaint  Patient presents with   12 month follow up     "Doing well." Medications reviewed by the patient verbally.     HPI:  [name] is a very pleasant 82 year old woman with history of  hypertension,  syncope,  tachycardia  OSA on CPAP Cardiac workup in 2011 at [place], echo and stress test CT coronary calcium scoring of 0 in May 2022 who presents for follow-up of her hypertension and tachycardia  Last seen by myself 8/23  Feels well, In retirement, has been active, lots of traveling Traveling to [employer] then [place], then [employer] this September  Recently got back from spending a month in [place] march 2024 Travel around [place], had a good time  No regular exercise program but stays active Has not been checking blood pressure at home, numbers elevated today  Reports she has been cutting some of her medications in half such as her HCTZ, and losartan Heart rate low today 51 documented on EKG  EKG personally reviewed by myself on todays visit EKG Interpretation Date/Time:  Thursday June 12 2023 11:37:33 EDT Ventricular Rate:  51 PR Interval:  230 QRS Duration:  80 QT Interval:  392 QTC Calculation: 361 R Axis:   -12  Text Interpretation: Sinus bradycardia with 1st degree A-V block When compared with ECG of 11-Apr-2017 13:26, PR interval has increased Confirmed by Julien Nordmann [phone]) on 06/12/2023 12:04:24 PM    PMH:   has a past medical history of Allergy, Arthritis, Asthma, Carpal tunnel syndrome (12/2017), Cataract of left eye (12/2017), Chicken pox, Depression, Diverticulitis, Diverticulosis, Dysrhythmia, GERD (gastroesophageal reflux disease), colonic polyps, Hyperlipidemia, Hypertension, Insomnia, 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  PSH:    Past Surgical History:  Procedure Laterality Date    ABDOMINAL HYSTERECTOMY  1994   total   BREAST BIOPSY Bilateral [id number]   BREAST EXCISIONAL BIOPSY     CARPAL TUNNEL RELEASE Bilateral    CATARACT EXTRACTION W/PHACO Right 12/02/2017   Procedure: CATARACT EXTRACTION PHACO AND INTRAOCULAR LENS PLACEMENT (IOC);  Surgeon: Galen Manila, MD;  Location: [facility] ORS;  Service: Ophthalmology;  Laterality: Right;  [place] 00:36 AP% 11.5 CDE 4.21 Fluid pack lot # [id number]   CATARACT EXTRACTION W/PHACO Left 12/30/2017   Procedure: CATARACT EXTRACTION PHACO AND INTRAOCULAR LENS PLACEMENT (IOC);  Surgeon: Galen Manila, MD;  Location: [facility] ORS;  Service: Ophthalmology;  Laterality: Left;  [place] 00:58 AP% 11.6 CDE 6.80 Fluid pack lot # [id number]   COLONOSCOPY WITH PROPOFOL N/A 06/30/2018   Procedure: COLONOSCOPY WITH PROPOFOL;  Surgeon: Wyline Mood, MD;  Location: [facility] ENDOSCOPY;  Service: Gastroenterology;  Laterality: N/A;   COLONOSCOPY WITH PROPOFOL N/A 01/07/2022   Procedure: COLONOSCOPY WITH PROPOFOL;  Surgeon: Toney Reil, MD;  Location: [facility] ENDOSCOPY;  Service: Gastroenterology;  Laterality: N/A;   DILATION AND CURETTAGE OF UTERUS     X 2   KNEE ARTHROSCOPY      Current Outpatient Medications  Medication Sig Dispense Refill   acetaminophen (TYLENOL) 500 MG tablet Take 500 mg by mouth every 8 (eight) hours as needed for moderate pain.     albuterol (VENTOLIN HFA) 108 (90 Base) MCG/ACT inhaler Inhale 2 puffs into the lungs every 4 (four) hours as needed for wheezing or shortness of breath. 8 g 8   b complex vitamins capsule Take 1 capsule  by mouth daily.     CREAM BASE EX Apply 1-2 application topically 4 (four) times daily as needed (for arthritis pain.). Penetrex Inflammation Formula (Arnica/Pyridoxine/MSM/Boswellia/Cetyl Myristoleate)     cyclobenzaprine (FLEXERIL) 5 MG tablet Take 0.5 tablets (2.5 mg total) by mouth at bedtime. 30 tablet 0   hydrochlorothiazide (HYDRODIURIL) 25 MG tablet Take 25 mg by mouth in the morning.      hydrOXYzine (ATARAX) 10 MG tablet Take 10 mg by mouth at bedtime.     ibuprofen (ADVIL,MOTRIN) 200 MG tablet Take 200 mg by mouth at bedtime as needed for moderate pain (for arthritis pain.).     Lidocaine 4 % PTCH Apply 1 patch topically daily as needed (pain).     mirtazapine (REMERON SOL-TAB) 15 MG disintegrating tablet DISSOLVE 1 TABLET  ON  TONGUE AT BEDTIME 20 tablet 0   atenolol (TENORMIN) 25 MG tablet Take 1 tablet (25 mg total) by mouth daily. 90 tablet 3   cholecalciferol (VITAMIN D) 1000 units tablet Take 1,000 Units by mouth every other day. At bedtime. (Patient not taking: Reported on 06/12/2023)     HYDROcodone-acetaminophen (NORCO/VICODIN) 5-325 MG tablet Take 0.5 tablets by mouth every 6 (six) hours as needed for moderate pain. (Patient not taking: Reported on 06/12/2023)     losartan (COZAAR) 50 MG tablet TAKE 1 TABLET ( 50 mg) IN THE MORNING  AND TAKE 1 TABLET ( 50 mg) IN THE EVENING 90 tablet 3   permethrin (ELIMITE) 5 % cream Apply cream whole body, from head to toe at bedtime; leave on for 8 to 12 hours (overnight), wash off next day. Repeat in 14 days if needed. (Patient not taking: Reported on 06/12/2023) 60 g 1   No current facility-administered medications for this visit.    Allergies:   Avelox [moxifloxacin hcl in nacl], Trazodone and nefazodone, and Clarithromycin   Social History:  The patient  reports that she has quit smoking. Her smoking use included cigarettes. She has a 3.50 pack-year smoking history. She has never used smokeless tobacco. She reports current alcohol use of about 4.0 standard drinks of alcohol per week. She reports that she does not use drugs.   Family History:   family history includes Arthritis in her maternal grandmother; Breast cancer (age of onset: 59) in her mother; Diabetes in her maternal grandmother.   Review of Systems: Review of Systems  Constitutional: Negative.   Respiratory: Negative.    Cardiovascular: Negative.   Gastrointestinal:  Negative.   Musculoskeletal: Negative.   Neurological: Negative.   Psychiatric/Behavioral: Negative.    All other systems reviewed and are negative.   PHYSICAL EXAM: VS:  BP (!) 155/78 (BP Location: Left Arm)   Pulse (!) 51   Ht 5' 0.75" (1.543 m)   Wt 174 lb (78.9 kg)   SpO2 97%   BMI 33.15 kg/m  , BMI Body mass index is 33.15 kg/m. Constitutional:  oriented to person, place, and time. No distress.  HENT:  Head: Grossly normal Eyes:  no discharge. No scleral icterus.  Neck: No JVD, no carotid bruits  Cardiovascular: Regular rate and rhythm, no murmurs appreciated Pulmonary/Chest: Clear to auscultation bilaterally, no wheezes or rails Abdominal: Soft.  no distension.  no tenderness.  Musculoskeletal: Normal range of motion Neurological:  normal muscle tone. Coordination normal. No atrophy Skin: Skin warm and dry Psychiatric: normal affect, pleasant   Recent Labs: 04/07/2023: ALT 13; BUN 23; Creat 0.85; Hemoglobin 12.8; Platelets 217; Potassium 4.4; Sodium 140    Lipid  Panel Lab Results  Component Value Date   CHOL 163 04/07/2023   HDL 62 04/07/2023   LDLCALC 87 04/07/2023   TRIG 56 04/07/2023      Wt Readings from Last 3 Encounters:  06/12/23 174 lb (78.9 kg)  04/14/23 171 lb (77.6 kg)  04/07/23 172 lb 9.6 oz (78.3 kg)      ASSESSMENT AND PLAN:   Essential hypertension - Plan: EKG 12-Lead Blood pressure elevated on visit today even on recheck Recommend she increase HCTZ up to 25 mg daily in the morning Decrease atenolol down to 25 daily given bradycardia Increase losartan up to 50 twice daily from 25 twice daily Suggested she monitor blood pressure at home for the next week or 2 and call us with numbers for further medication titration  Pure hypercholesterolemia Currently not on any cholesterol medication. Calcium score of 0, Number cholesterol numbers last month Lifestyle modification recommended, walking program  OSA  On CPAP, stable  Obesity We  have encouraged continued exercise, careful diet management in an effort to lose weight.   Total encounter time more than 40 minutes  Greater than 50% was spent in counseling and coordination of care with the patient   Orders Placed This Encounter  Procedures   EKG 12-Lead     Signed, Dossie Arbour, M.D., Ph.D. 06/12/2023  [facility] [place], [place] [phone]

## 2023-06-12 ENCOUNTER — Encounter: Payer: Self-pay | Admitting: Cardiovascular Disease

## 2023-06-12 ENCOUNTER — Ambulatory Visit: Payer: Medicare HMO | Attending: Cardiovascular Disease | Admitting: Cardiovascular Disease

## 2023-06-12 VITALS — BP 155/78 | HR 51 | Ht 60.75 in | Wt 174.0 lb

## 2023-06-12 DIAGNOSIS — G4733 Obstructive sleep apnea (adult) (pediatric): Secondary | ICD-10-CM

## 2023-06-12 DIAGNOSIS — E782 Mixed hyperlipidemia: Secondary | ICD-10-CM | POA: Diagnosis not present

## 2023-06-12 DIAGNOSIS — I10 Essential (primary) hypertension: Secondary | ICD-10-CM

## 2023-06-12 DIAGNOSIS — K219 Gastro-esophageal reflux disease without esophagitis: Secondary | ICD-10-CM

## 2023-06-12 MED ORDER — LOSARTAN POTASSIUM 50 MG PO TABS: ORAL_TABLET | ORAL | 3 refills | Status: DC

## 2023-06-12 MED ORDER — ATENOLOL 25 MG PO TABS: 25.0000 mg | ORAL_TABLET | Freq: Every day | ORAL | 3 refills | Status: DC

## 2023-06-12 NOTE — Patient Instructions (Addendum)
Medication Instructions:  Whole hydrochlorothiazide in the Am Atenolol 25 once a day Losartan 50 mg twice a day  Monitor pressure, call with numbers   If you need a refill on your cardiac medications before your next appointment, please call your pharmacy.   Lab work: No new labs needed  Testing/Procedures: No new testing needed  Follow-Up: At [facility], you and your health needs are our priority.  As part of our continuing mission to provide you with exceptional heart care, we have created designated Provider Care Teams.  These Care Teams include your primary Cardiologist (physician) and Advanced Practice Providers (APPs -  Physician Assistants and Nurse Practitioners) who all work together to provide you with the care you need, when you need it.  You will need a follow up appointment in 12 months  Providers on your designated Care Team:   Nicolasa Ducking, NP Eula Listen, PA-C Cadence Fransico Michael, [place]  COVID-19 Vaccine Information can be found at: [email or web address] For questions related to vaccine distribution or appointments, please email [email or web address] or call [phone].

## 2023-06-30 ENCOUNTER — Other Ambulatory Visit: Payer: Self-pay | Admitting: Cardiovascular Disease

## 2023-07-22 ENCOUNTER — Encounter: Payer: Self-pay | Admitting: Internal Medicine

## 2023-07-25 DIAGNOSIS — M5451 Vertebrogenic low back pain: Secondary | ICD-10-CM | POA: Diagnosis not present

## 2023-07-25 DIAGNOSIS — M9903 Segmental and somatic dysfunction of lumbar region: Secondary | ICD-10-CM | POA: Diagnosis not present

## 2023-08-04 DIAGNOSIS — Z961 Presence of intraocular lens: Secondary | ICD-10-CM | POA: Diagnosis not present

## 2023-08-04 DIAGNOSIS — Z0100 Encounter for examination of eyes and vision without abnormal findings: Secondary | ICD-10-CM | POA: Diagnosis not present

## 2023-08-05 ENCOUNTER — Ambulatory Visit (INDEPENDENT_AMBULATORY_CARE_PROVIDER_SITE_OTHER): Payer: Medicare HMO | Admitting: Internal Medicine

## 2023-08-05 ENCOUNTER — Encounter: Payer: Self-pay | Admitting: Internal Medicine

## 2023-08-05 VITALS — BP 122/66 | HR 61 | Temp 96.8°F | Wt 170.0 lb

## 2023-08-05 DIAGNOSIS — M25551 Pain in right hip: Secondary | ICD-10-CM

## 2023-08-05 DIAGNOSIS — G8929 Other chronic pain: Secondary | ICD-10-CM

## 2023-08-05 DIAGNOSIS — Z6832 Body mass index (BMI) 32.0-32.9, adult: Secondary | ICD-10-CM | POA: Diagnosis not present

## 2023-08-05 DIAGNOSIS — E6609 Other obesity due to excess calories: Secondary | ICD-10-CM | POA: Diagnosis not present

## 2023-08-05 MED ORDER — HYDROCODONE-ACETAMINOPHEN 5-325 MG PO TABS: 1.0000 | ORAL_TABLET | Freq: Three times a day (TID) | ORAL | 0 refills | Status: DC | PRN

## 2023-08-05 NOTE — Patient Instructions (Signed)
Hip Exercises Ask your health care provider which exercises are safe for you. Do exercises exactly as told by your provider and adjust them as told. It is normal to feel mild stretching, pulling, tightness, or discomfort as you do these exercises. Stop right away if you feel sudden pain or your pain gets worse. Do not begin these exercises until told by your provider. Stretching and range-of-motion exercises These exercises warm up your muscles and joints and improve the movement and flexibility of your hip. They also help to relieve pain, numbness, and tingling. You may be asked to limit your range of motion if you had a hip replacement. Talk to your provider about these limits. Hamstrings, supine  Lie on your back (supine position). Loop a belt, towel, or exercise band over the ball of your left / right foot. The ball of your foot is on the walking surface, right under your toes. Straighten your left / right knee and slowly pull on the belt, towel, or band to raise your leg until you feel a gentle stretch behind your knee (hamstring). Do not let your knee bend while you do this. Keep your other leg flat on the floor. Hold this position for __________ seconds. Slowly return your leg to the starting position. Repeat __________ times. Complete this exercise __________ times a day. Hip rotation  Lie on your back on a firm surface. With your left / right hand, gently pull your left / right knee toward the shoulder that is on the same side of the body. Stop when your knee is pointing toward the ceiling. Hold your left / right ankle with your other hand. Keeping your knee steady, gently pull your left / right ankle toward your other shoulder until you feel a stretch in your butt. Keep your hips and shoulders firmly planted while you do this stretch. Hold this position for __________ seconds. Repeat __________ times. Complete this exercise __________ times a day. Seated stretch This exercise is  sometimes called hamstrings and adductors stretch. Sit on the floor with your legs stretched wide. Keep your knees straight during this exercise. Keeping your head and back in a straight line, bend at your waist to reach for your left foot (position A). You should feel a stretch in your right inner thigh (adductors). Hold this position for __________ seconds. Then slowly return to the upright position. Keeping your head and back in a straight line, bend at your waist to reach forward (position B). You should feel a stretch behind both of your thighs and knees (hamstrings). Hold this position for __________ seconds. Then slowly return to the upright position. Keeping your head and back in a straight line, bend at your waist to reach for your right foot (position C). You should feel a stretch in your left inner thigh (adductors). Hold this position for __________ seconds. Then slowly return to the upright position. Repeat __________ times. Complete this exercise __________ times a day. Lunge This exercise stretches the muscles of the hip (hip flexors). Place your left / right knee on the floor and bend your other knee so that is directly over your ankle. You should be half-kneeling. Keep good posture with your head over your shoulders. Tighten your butt muscles to point your tailbone downward. This will prevent your back from arching too much. You should feel a gentle stretch in the front of your left / right thigh and hip. If you do not feel a stretch, slide your other foot forward slightly and  then slowly lunge forward with your chest up until your knee once again lines up over your ankle. Make sure your tailbone continues to point downward. Hold this position for __________ seconds. Slowly return to the starting position. Repeat __________ times. Complete this exercise __________ times a day. Strengthening exercises These exercises build strength and endurance in your hip. Endurance is the  ability to use your muscles for a long time, even after they get tired. Bridge This exercise strengthens the muscles of your hip (hip extensors). Lie on your back on a firm surface with your knees bent and your feet flat on the floor. Tighten your butt muscles and lift your bottom off the floor until the trunk of your body and your hips are level with your thighs. Do not arch your back. You should feel the muscles working in your butt and the back of your thighs. If you do not feel these muscles, slide your feet 1-2 inches (2.5-5 cm) farther away from your butt. Hold this position for __________ seconds. Slowly lower your hips to the starting position. Let your muscles relax completely between repetitions. Repeat __________ times. Complete this exercise __________ times a day. Straight leg raises, side-lying This exercise strengthens the muscles that move the hip joint away from the center of the body (hip abductors). Lie on your side with your left / right leg in the top position. Lie so your head, shoulder, hip, and knee line up. You may bend your bottom knee slightly to help you balance. Roll your hips slightly forward, so your hips are stacked directly over each other and your left / right knee is facing forward. Leading with your heel, lift your top leg 4-6 inches (10-15 cm). You should feel the muscles in your top hip lifting. Do not let your foot drift forward. Do not let your knee roll toward the ceiling. Hold this position for __________ seconds. Slowly return to the starting position. Let your muscles relax completely between repetitions. Repeat __________ times. Complete this exercise __________ times a day. Straight leg raises, side-lying This exercise strengthens the muscles that move the hip joint toward the center of the body (hip adductors). Lie on your side with your left / right leg in the bottom position. Lie so your head, shoulder, hip, and knee line up. You may place your  upper foot in front to help you balance. Roll your hips slightly forward, so your hips are stacked directly over each other and your left / right knee is facing forward. Tense the muscles in your inner thigh and lift your bottom leg 4-6 inches (10-15 cm). Hold this position for __________ seconds. Slowly return to the starting position. Let your muscles relax completely between repetitions. Repeat __________ times. Complete this exercise __________ times a day. Straight leg raises, supine This exercise strengthens the muscles in the front of your thigh (quadriceps and hip flexors). Lie on your back (supine position) with your left / right leg extended and your other knee bent. Tense the muscles in the front of your left / right thigh. You should see your kneecap slide up or see increased dimpling just above your knee. Keep these muscles tight as you raise your leg 4-6 inches (10-15 cm) off the floor. Do not let your knee bend. Hold this position for __________ seconds. Keep these muscles tense as you lower your leg. Relax the muscles slowly and completely between repetitions. Repeat __________ times. Complete this exercise __________ times a day. Hip abductors, standing This  exercise strengthens the muscles that move the leg and hip joint away from the center of the body (hip abductors). Tie one end of a rubber exercise band or tubing to a secure surface, such as a chair, table, or pole. Loop the other end of the band or tubing around your left / right ankle. Keeping your ankle with the band or tubing directly opposite the secured end, step away until there is tension in the tubing or band. Hold on to a chair, table, or pole as needed for balance. Lift your left / right leg out to your side. While you do this: Keep your back upright. Keep your shoulders over your hips. Keep your toes pointing forward. Make sure to use your hip muscles to slowly lift your leg. Do not tip your body or  forcefully lift your leg. Hold this position for __________ seconds. Slowly return to the starting position. Repeat __________ times. Complete this exercise __________ times a day. Squats This exercise strengthens the muscles in the front of your thigh (quadriceps). Stand in front of a table, or stand in a doorframe so your feet and knees are in line with the frame. You may place your hands on the table or frame for balance. Slowly bend your knees and lower your hips like you are going to sit in a chair. Keep your lower legs in a straight up-and-down position. Do not let your hips go lower than your knees. Do not bend your knees lower than told by your provider. If your hip pain increases, do not bend as low. Hold this position for ___________ seconds. Slowly push with your legs to return to standing. Do not use your hands to pull yourself to standing. Repeat __________ times. Complete this exercise __________ times a day. This information is not intended to replace advice given to you by your health care provider. Make sure you discuss any questions you have with your health care provider. Document Revised: 08/06/2022 Document Reviewed: 08/06/2022 Elsevier Patient Education  2024 [employer].

## 2023-08-05 NOTE — Progress Notes (Signed)
Subjective:    Patient ID: Molly Faulkner, female    DOB: 1941-06-17, 82 y.o.   MRN: [id number]  HPI  Patient presents to clinic today with complaint of hip pain.  This started years ago but seemed to be getting worse in the last few months.  She describes the pain as sore and achy. The pain is worse with walking for prolonged amount of time or lifting the right leg. The pain does not radiate. She denies numbness or tingling but has had some weakness in right leg.  There is no imaging on file for the right hip.  She takes ibuprofen and cyclobenzaprine as needed with some relief of symptoms. She had a left hip replacement about 3 years ago with Dr. Derryl Harbor.  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     albuterol (VENTOLIN HFA) 108 (90 Base) MCG/ACT inhaler Inhale 2 puffs into the lungs every 4 (four) hours as needed for wheezing or shortness of breath. 8 g 8   atenolol (TENORMIN) 25 MG tablet Take 1 tablet (25 mg total) by mouth daily. 90 tablet 2   b complex vitamins capsule Take 1 capsule by mouth daily.     cholecalciferol (VITAMIN D) 1000 units tablet Take 1,000 Units by mouth every other day. At bedtime. (Patient not taking: Reported on 06/12/2023)     CREAM BASE EX Apply 1-2 application topically 4 (four) times daily as needed (for arthritis pain.).  Penetrex Inflammation Formula (Arnica/Pyridoxine/MSM/Boswellia/Cetyl Myristoleate)     cyclobenzaprine (FLEXERIL) 5 MG tablet Take 0.5 tablets (2.5 mg total) by mouth at bedtime. 30 tablet 0   hydrochlorothiazide (HYDRODIURIL) 25 MG tablet Take 25 mg by mouth in the morning.     HYDROcodone-acetaminophen (NORCO/VICODIN) 5-325 MG tablet Take 0.5 tablets by mouth every 6 (six) hours as needed for moderate pain. (Patient not taking: Reported on 06/12/2023)     hydrOXYzine (ATARAX) 10 MG tablet Take 10 mg by mouth at bedtime.     ibuprofen (ADVIL,MOTRIN) 200 MG tablet Take 200 mg by mouth at bedtime as needed for moderate pain (for arthritis pain.).     Lidocaine 4 % PTCH Apply 1 patch topically daily as needed (pain).     losartan (COZAAR) 50 MG tablet TAKE 1 TABLET ( 50 mg) IN THE MORNING  AND TAKE 1 TABLET ( 50 mg) IN THE EVENING 90 tablet 3   mirtazapine (REMERON SOL-TAB) 15 MG disintegrating tablet DISSOLVE 1 TABLET  ON  TONGUE AT BEDTIME 20 tablet 0   permethrin (ELIMITE) 5 % cream Apply cream whole body, from head to toe at bedtime; leave on for 8 to 12 hours (overnight),  wash off next day. Repeat in 14 days if needed. (Patient not taking: Reported on 06/12/2023) 60 g 1   No current facility-administered medications for this visit.    Allergies  Allergen Reactions   Avelox [Moxifloxacin Hcl In Nacl] Other (See Comments)    Chills   Trazodone And Nefazodone Swelling   Clarithromycin Other (See Comments)    Unsure of reaction type    Family History  Problem Relation Age of Onset   Breast cancer Mother 13   Arthritis Maternal Grandmother    Diabetes Maternal Grandmother     Social History   Socioeconomic History   Marital status: Single    Spouse name: Not on file   Number of children: Not on file   Years of education: Not on file   Highest education level: Not on file  Occupational History   Not on file  Tobacco Use   Smoking status: Former    Current packs/day: 0.50     Average packs/day: 0.5 packs/day for 7.0 years (3.5 ttl pk-yrs)    Types: Cigarettes   Smokeless tobacco: Never   Tobacco comments:    quit over 40 years ago  Vaping Use   Vaping status: Never Used  Substance and Sexual Activity   Alcohol use: Yes    Alcohol/week: 4.0 standard drinks of alcohol    Types: 4 Glasses of wine per week   Drug use: No   Sexual activity: Not Currently  Other Topics Concern   Not on file  Social History Narrative   Not on file   Social Determinants of Health   Financial Resource Strain: Low Risk  (07/26/2022)   Overall Financial Resource Strain (CARDIA)    Difficulty of Paying Living Expenses: Not hard at all  Food Insecurity: No Food Insecurity (07/26/2022)   Hunger Vital Sign    Worried About Running Out of Food in the Last Year: Never true    Ran Out of Food in the Last Year: Never true  Transportation Needs: No Transportation Needs (07/26/2022)   PRAPARE - Administrator, Civil Service (Medical): No    Lack of Transportation (Non-Medical): No  Physical Activity: Unknown (07/26/2022)   Exercise Vital Sign    Days of Exercise per Week: 0 days    Minutes of Exercise per Session: Patient declined  Stress: No Stress Concern Present (07/26/2022)   [employer] of Occupational Health - Occupational Stress Questionnaire    Feeling of Stress : Only a little  Social Connections: Socially Isolated (07/26/2022)   Social Connection and Isolation Panel [NHANES]    Frequency of Communication with Friends and Family: Three times a week    Frequency of Social Gatherings with Friends and Family: Once a week    Attends Religious Services: Never    Database administrator or Organizations: No    Attends Banker Meetings: Never    Marital Status: Divorced  Catering manager Violence: Not At Risk (07/26/2022)   Humiliation, Afraid, Rape, and Kick questionnaire    Fear of Current or Ex-Partner: No    Emotionally Abused: No    Physically  Abused: No    Sexually Abused: No     Constitutional: Denies fever, malaise, fatigue, headache or abrupt weight changes.  Respiratory: Denies difficulty breathing, shortness of breath, cough or sputum production.   Cardiovascular: Denies chest pain, chest tightness, palpitations or swelling in the hands or feet.  Gastrointestinal: Denies abdominal pain, bloating, constipation, diarrhea or blood in the  stool.  GU: Denies urgency, frequency, pain with urination, burning sensation, blood in urine, odor or discharge. Musculoskeletal: Patient reports right hip pain, chronic back pain.  Denies decrease in range of motion, difficulty with gait, or joint swelling.  Skin: Denies redness, rashes, lesions or ulcercations.  Neurological: Patient reports insomnia.  Denies dizziness, difficulty with memory, difficulty with speech or problems with balance and coordination.  Psych: Patient has a history of depression.  Denies anxiety, SI/HI.  No other specific complaints in a complete review of systems (except as listed in HPI above).  Objective:   Physical Exam  BP 122/66 (BP Location: Left Arm, Patient Position: Sitting, Cuff Size: Normal)   Pulse 61   Temp (!) 96.8 F (36 C) (Temporal)   Wt 170 lb (77.1 kg)   SpO2 96%   BMI 32.39 kg/m   Wt Readings from Last 3 Encounters:  06/12/23 174 lb (78.9 kg)  04/14/23 171 lb (77.6 kg)  04/07/23 172 lb 9.6 oz (78.3 kg)    General: Appears her stated age, obese, in NAD. Skin: Warm, dry and intact Cardiovascular: Normal rate and rhythm. S1,S2 noted.  No murmur, rubs or gallops noted. Pulmonary/Chest: Normal effort and positive vesicular breath sounds. No respiratory distress. No wheezes, rales or ronchi noted.  Musculoskeletal: Normal abduction, adduction, flexion, extension, internal and external rotation of the right hip.  Slight pain with palpation over the right trochanter, right groin and right SI joint.  Strength 5/5 BLE.  She does have some  difficulty getting off of the exam table.  Gait slow and steady without device. Neurological: Alert and oriented.   BMET    Component Value Date/Time   NA 140 04/07/2023 1442   NA 140 01/10/2020 0950   K 4.4 04/07/2023 1442   CL 107 04/07/2023 1442   CO2 29 04/07/2023 1442   GLUCOSE 86 04/07/2023 1442   BUN 23 04/07/2023 1442   BUN 15 01/10/2020 0950   CREATININE 0.85 04/07/2023 1442   CALCIUM 11.1 (H) 04/07/2023 1442   GFRNONAA 64 01/10/2020 0950   GFRAA 74 01/10/2020 0950    Lipid Panel     Component Value Date/Time   CHOL 163 04/07/2023 1442   CHOL 203 (H) 01/10/2020 0950   TRIG 56 04/07/2023 1442   HDL 62 04/07/2023 1442   HDL 63 01/10/2020 0950   CHOLHDL 2.6 04/07/2023 1442   VLDL 9 03/22/2020 1139   LDLCALC 87 04/07/2023 1442    CBC    Component Value Date/Time   WBC 5.5 04/07/2023 1442   RBC 3.95 04/07/2023 1442   HGB 12.8 04/07/2023 1442   HGB 13.9 01/10/2020 0950   HCT 37.6 04/07/2023 1442   HCT 40.1 01/10/2020 0950   PLT 217 04/07/2023 1442   PLT 227 01/10/2020 0950   MCV 95.2 04/07/2023 1442   MCV 97 01/10/2020 0950   MCH 32.4 04/07/2023 1442   MCHC 34.0 04/07/2023 1442   RDW 11.9 04/07/2023 1442   RDW 12.3 01/10/2020 0950   LYMPHSABS 1.0 01/10/2020 0950   EOSABS 0.3 01/10/2020 0950   BASOSABS 0.0 01/10/2020 0950    Hgb A1C Lab Results  Component Value Date   HGBA1C 5.0 04/07/2023            Assessment & Plan:   Chronic right hip pain:  She would prefer Dr. Odis Luster to do the imaging at his office Referral to orthopedics placed per her request Continue ibuprofen and cyclobenzaprine as needed Rx for a short supply of  hydrocodone 5-325 mg daily for severe pain  RTC in 2 months for annual exam Nicki Reaper, NP

## 2023-08-05 NOTE — Assessment & Plan Note (Signed)
Encourage weight loss as this can help reduce joint pain 

## 2023-08-21 ENCOUNTER — Other Ambulatory Visit: Payer: Self-pay | Admitting: Internal Medicine

## 2023-08-21 DIAGNOSIS — I10 Essential (primary) hypertension: Secondary | ICD-10-CM

## 2023-08-22 NOTE — Telephone Encounter (Signed)
Requested medications are due for refill today.  unsure  Requested medications are on the active medications list.  yes  Last refill. 06/12/2023 unknown quantity  Future visit scheduled.   yes  Notes to clinic.  Medication is historical.    Requested Prescriptions  Pending Prescriptions Disp Refills   hydrochlorothiazide (HYDRODIURIL) 25 MG tablet [Pharmacy Med Name: hydroCHLOROthiazide Oral Tablet 25 MG] 90 tablet 3    Sig: TAKE 1 TABLET EVERY DAY (WILL NEED TO SCHEDULE AN OFFICE VISIT FOR FURTHER REFILLS)     Cardiovascular: Diuretics - Thiazide Passed - 08/21/2023  2:45 AM      Passed - Cr in normal range and within 180 days    Creat  Date Value Ref Range Status  04/07/2023 0.85 0.60 - 1.00 mg/dL Final         Passed - K in normal range and within 180 days    Potassium  Date Value Ref Range Status  04/07/2023 4.4 3.5 - 5.3 mmol/L Final         Passed - Na in normal range and within 180 days    Sodium  Date Value Ref Range Status  04/07/2023 140 135 - 146 mmol/L Final  01/10/2020 140 134 - 144 mmol/L Final         Passed - Last BP in normal range    BP Readings from Last 1 Encounters:  08/05/23 122/66         Passed - Valid encounter within last 6 months    Recent Outpatient Visits           2 weeks ago Chronic right hip pain   [place] [facility] [place], Salvadore Oxford, NP   4 months ago [facility] [place], Salvadore Oxford, NP   4 months ago Mixed hyperlipidemia   [place] [facility] [place], Salvadore Oxford, NP   11 months ago Encounter for general adult medical examination with abnormal findings   [place] [facility] [place], Salvadore Oxford, NP   1 year ago [employer]   [place] [facility] [place], Salvadore Oxford, NP       Future Appointments             In 1 month Baity, Salvadore Oxford, NP [facility], [facility]

## 2023-08-22 NOTE — Telephone Encounter (Signed)
Requested medication (s) are due for refill today: Yes  Requested medication (s) are on the active medication list: Yes  Last refill:  06/12/23  Future visit scheduled: Yes  Notes to clinic:  Historical provider.    Requested Prescriptions  Pending Prescriptions Disp Refills   hydrochlorothiazide (HYDRODIURIL) 25 MG tablet [Pharmacy Med Name: hydroCHLOROthiazide Oral Tablet 25 MG] 90 tablet 3    Sig: TAKE 1 TABLET EVERY DAY (WILL NEED TO SCHEDULE AN OFFICE VISIT FOR FURTHER REFILLS)     Cardiovascular: Diuretics - Thiazide Passed - 08/21/2023  2:45 AM      Passed - Cr in normal range and within 180 days    Creat  Date Value Ref Range Status  04/07/2023 0.85 0.60 - 1.00 mg/dL Final         Passed - K in normal range and within 180 days    Potassium  Date Value Ref Range Status  04/07/2023 4.4 3.5 - 5.3 mmol/L Final         Passed - Na in normal range and within 180 days    Sodium  Date Value Ref Range Status  04/07/2023 140 135 - 146 mmol/L Final  01/10/2020 140 134 - 144 mmol/L Final         Passed - Last BP in normal range    BP Readings from Last 1 Encounters:  08/05/23 122/66         Passed - Valid encounter within last 6 months    Recent Outpatient Visits           2 weeks ago Chronic right hip pain   [place] [facility] [place], Salvadore Oxford, NP   4 months ago [facility] [place], Salvadore Oxford, NP   4 months ago Mixed hyperlipidemia   [place] [facility] [place], Salvadore Oxford, NP   11 months ago Encounter for general adult medical examination with abnormal findings   [place] [facility] [place], Salvadore Oxford, NP   1 year ago [employer]   [place] [facility] [place], Salvadore Oxford, NP       Future Appointments             In 1 month Baity, Salvadore Oxford, NP [place] [facility], [facility]

## 2023-08-25 DIAGNOSIS — M1711 Unilateral primary osteoarthritis, right knee: Secondary | ICD-10-CM | POA: Diagnosis not present

## 2023-08-25 DIAGNOSIS — M1611 Unilateral primary osteoarthritis, right hip: Secondary | ICD-10-CM | POA: Diagnosis not present

## 2023-08-28 ENCOUNTER — Ambulatory Visit (INDEPENDENT_AMBULATORY_CARE_PROVIDER_SITE_OTHER): Payer: Medicare HMO

## 2023-08-28 DIAGNOSIS — Z0000 Encounter for general adult medical examination without abnormal findings: Secondary | ICD-10-CM

## 2023-08-28 NOTE — Progress Notes (Signed)
Subjective:   Molly Faulkner is a 82 y.o. female who presents for Medicare Annual (Subsequent) preventive examination.  Visit Complete: Virtual  I connected with  Tessie Eke on 08/28/23 by a audio enabled telemedicine application and verified that I am speaking with the correct person using two identifiers.  Patient Location: Home  Provider Location: Office/Clinic  I discussed the limitations of evaluation and management by telemedicine. The patient expressed understanding and agreed to proceed.   Vital Signs: Unable to obtain new vitals due to this being a telehealth visit.  Review of Systems     Cardiac Risk Factors include: advanced age (>34men, >39 women);dyslipidemia;hypertension;obesity (BMI >30kg/m2)     Objective:    There were no vitals filed for this visit. There is no height or weight on file to calculate BMI.     08/28/2023    1:10 PM 07/26/2022    3:34 PM 01/30/2022    3:44 PM 01/07/2022    7:07 AM 06/30/2018    9:13 AM 12/02/2017   10:00 AM 04/11/2017    1:23 PM  Advanced Directives  Does Patient Have a Medical Advance Directive? No No Yes Yes Yes Yes Yes  Type of Agricultural consultant;Living will Healthcare Power of [employer] of [place];Living will  Does patient want to make changes to medical advance directive?   No - Patient declined    No - Patient declined  Copy of Healthcare Power of Attorney in Chart?     No - copy requested Yes No - copy requested  Would patient like information on creating a medical advance directive? No - Patient declined No - Patient declined         Current Medications (verified) Outpatient Encounter Medications as of 08/28/2023  Medication Sig   acetaminophen (TYLENOL) 500 MG tablet Take 500 mg by mouth every 8 (eight) hours as needed for moderate pain.   albuterol (VENTOLIN HFA) 108 (90 Base) MCG/ACT inhaler Inhale 2 puffs into the lungs every 4 (four) hours as needed for wheezing  or shortness of breath.   atenolol (TENORMIN) 25 MG tablet Take 1 tablet (25 mg total) by mouth daily.   b complex vitamins capsule Take 1 capsule by mouth daily.   celecoxib (CELEBREX) 200 MG capsule Take 200 mg by mouth daily.   CREAM BASE EX Apply 1-2 application topically 4 (four) times daily as needed (for arthritis pain.). Penetrex Inflammation Formula (Arnica/Pyridoxine/MSM/Boswellia/Cetyl Myristoleate)   cyclobenzaprine (FLEXERIL) 5 MG tablet Take 0.5 tablets (2.5 mg total) by mouth at bedtime.   hydrochlorothiazide (HYDRODIURIL) 25 MG tablet TAKE 1 TABLET EVERY DAY (WILL NEED TO SCHEDULE AN OFFICE VISIT FOR FURTHER REFILLS)   HYDROcodone-acetaminophen (NORCO/VICODIN) 5-325 MG tablet Take 1 tablet by mouth every 8 (eight) hours as needed for moderate pain.   hydrOXYzine (ATARAX) 10 MG tablet Take 10 mg by mouth at bedtime.   Lidocaine 4 % PTCH Apply 1 patch topically daily as needed (pain).   losartan (COZAAR) 50 MG tablet TAKE 1 TABLET ( 50 mg) IN THE MORNING  AND TAKE 1 TABLET ( 50 mg) IN THE EVENING   mirtazapine (REMERON SOL-TAB) 15 MG disintegrating tablet DISSOLVE 1 TABLET  ON  TONGUE AT BEDTIME   ibuprofen (ADVIL,MOTRIN) 200 MG tablet Take 200 mg by mouth at bedtime as needed for moderate pain (for arthritis pain.). (Patient not taking: Reported on 08/28/2023)   No facility-administered encounter medications on file as of 08/28/2023.  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    Sleep apnea    H/O   NO LONGER   Syncope    Past Surgical History:  Procedure Laterality Date   ABDOMINAL HYSTERECTOMY  1994   total   BREAST BIOPSY Bilateral [id number]   BREAST EXCISIONAL BIOPSY     CARPAL TUNNEL RELEASE Bilateral    CATARACT EXTRACTION W/PHACO Right 12/02/2017   Procedure: CATARACT EXTRACTION PHACO AND INTRAOCULAR LENS PLACEMENT (IOC);  Surgeon: Galen Manila, MD;  Location: [facility] ORS;  Service: Ophthalmology;  Laterality: Right;  [place] 00:36 AP% 11.5 CDE 4.21 Fluid pack lot # [id number]   CATARACT EXTRACTION W/PHACO Left 12/30/2017   Procedure: CATARACT EXTRACTION PHACO AND INTRAOCULAR LENS PLACEMENT (IOC);  Surgeon: Galen Manila, MD;  Location: [facility] ORS;  Service: Ophthalmology;  Laterality: Left;  [place] 00:58 AP% 11.6 CDE 6.80 Fluid pack lot # [id number]   COLONOSCOPY WITH PROPOFOL N/A 06/30/2018   Procedure: COLONOSCOPY WITH PROPOFOL;  Surgeon: Wyline Mood, MD;  Location: [facility];  Service: Gastroenterology;  Laterality: N/A;   COLONOSCOPY WITH PROPOFOL N/A 01/07/2022   Procedure: COLONOSCOPY WITH PROPOFOL;  Surgeon: Toney Reil, MD;  Location: [facility] ENDOSCOPY;  Service: Gastroenterology;  Laterality: N/A;   DILATION AND CURETTAGE OF UTERUS     X 2   KNEE ARTHROSCOPY     Family History  Problem Relation Age of Onset   Breast cancer Mother 35   Arthritis Maternal Grandmother    Diabetes Maternal Grandmother    Social History   Socioeconomic History   Marital status: Single    Spouse name: Not on file   Number of children: Not on file   Years of education: Not on file   Highest education level: Not on file  Occupational History   Not on file  Tobacco Use   Smoking status: Former    Current packs/day: 0.50    Average packs/day: 0.5 packs/day for 7.0 years (3.5 ttl pk-yrs)    Types: Cigarettes   Smokeless tobacco: Never   Tobacco comments:    quit over 40 years ago  Vaping Use   Vaping status: Never Used  Substance and Sexual Activity    Alcohol use: Yes    Alcohol/week: 4.0 standard drinks of alcohol    Types: 4 Glasses of wine per week   Drug use: No   Sexual activity: Not Currently  Other Topics Concern   Not on file  Social History Narrative   Not on file   Social Determinants of Health   Financial Resource Strain: Low Risk  (08/28/2023)   Overall Financial Resource Strain (CARDIA)    Difficulty of Paying Living Expenses: Not very hard  Food Insecurity: No Food Insecurity (08/28/2023)   Hunger Vital Sign    Worried About Running Out of Food in the Last Year: Never true    Ran Out of Food in the  Last Year: Never true  Transportation Needs: No Transportation Needs (08/28/2023)   PRAPARE - Administrator, Civil Service (Medical): No    Lack of Transportation (Non-Medical): No  Physical Activity: Insufficiently Active (08/28/2023)   Exercise Vital Sign    Days of Exercise per Week: 2 days    Minutes of Exercise per Session: 20 min  Stress: No Stress Concern Present (08/28/2023)   [employer] of Occupational Health - Occupational Stress Questionnaire    Feeling of Stress : Only a little  Social Connections: Moderately Isolated (08/28/2023)   Social Connection and Isolation Panel [NHANES]    Frequency of Communication with Friends and Family: More than three times a week    Frequency of Social Gatherings with Friends and Family: Never    Attends Religious Services: Never    Database administrator or Organizations: Yes    Attends Engineer, structural: More than 4 times per year    Marital Status: Divorced    Tobacco Counseling Counseling given: Not Answered Tobacco comments: quit over 40 years ago   Clinical Intake:  Pre-visit preparation completed: Yes  Pain : No/denies pain     Nutritional Status: BMI > 30  Obese Nutritional Risks: None Diabetes: No  How often do you need to have someone help you when you read instructions, pamphlets, or other written materials from your  doctor or pharmacy?: 1 - Never  Interpreter Needed?: No  Information entered by :: Kennedy Bucker, LPN   Activities of Daily Living    08/28/2023    1:11 PM 08/05/2023   10:22 AM  In your present state of health, do you have any difficulty performing the following activities:  Hearing? 1 1  Vision? 0 0  Difficulty concentrating or making decisions? 0 1  Walking or climbing stairs? 1 1  Comment hip   Dressing or bathing? 0 0  Doing errands, shopping? 0 0  Preparing Food and eating ? N   Using the Toilet? N   In the past six months, have you accidently leaked urine? N   Do you have problems with loss of bowel control? N   Managing your Medications? N   Managing your Finances? N   Housekeeping or managing your Housekeeping? N     Patient Care Team: Lorre Munroe, NP as PCP - General (Internal Medicine) Antonieta Iba, MD as PCP - Cardiology (Cardiology) Antonieta Iba, MD as Consulting Physician (Cardiology)  Indicate any recent Medical Services you may have received from other than [facility] providers in the past year (date may be approximate).     Assessment:   This is a routine wellness examination for Delores.  Hearing/Vision screen Hearing Screening - Comments:: No aids Vision Screening - Comments:: Wears glasses- [facility]   Goals Addressed             This Visit's Progress    DIET - INCREASE WATER INTAKE         Depression Screen    08/28/2023    1:08 PM 08/05/2023   10:21 AM 04/07/2023    2:30 PM 07/26/2022    3:31 PM 04/25/2022    9:43 AM 04/25/2022    9:29 AM 04/25/2022    9:28 AM  PHQ 2/9 Scores  PHQ - 2 Score 0 0 0 0 1 0 0  PHQ- 9 Score 0  0 0 3 0 2    Fall Risk    08/28/2023    1:10  PM 08/05/2023   10:21 AM 04/07/2023    2:31 PM 07/26/2022    3:35 PM 04/25/2022    9:43 AM  Fall Risk   Falls in the past year? 0 0 0 0 0  Number falls in past yr: 0  0 0 0  Injury with Fall? 0 0 0 0 0  Risk for fall due to : No Fall Risks  No Fall Risks No  Fall Risks No Fall Risks  Follow up Falls prevention discussed;Falls evaluation completed  Falls evaluation completed Falls evaluation completed Falls evaluation completed    MEDICARE RISK AT HOME: Medicare Risk at Home Any stairs in or around the home?: No If so, are there any without handrails?: No Home free of loose throw rugs in walkways, pet beds, electrical cords, etc?: Yes Adequate lighting in your home to reduce risk of falls?: Yes Life alert?: No Use of a cane, walker or w/c?: Yes (walker if on feet a long time, uses cane outside) Grab bars in the bathroom?: Yes Shower chair or bench in shower?: No Elevated toilet seat or a handicapped toilet?: Yes  TIMED UP AND GO:  Was the test performed?  No    Cognitive Function:        08/28/2023    1:12 PM 07/26/2022    3:36 PM  6CIT Screen  What Year? 0 points 0 points  What month? 0 points 0 points  What time? 0 points 0 points  Count back from 20 0 points 0 points  Months in reverse 0 points 0 points  Repeat phrase 0 points 0 points  Total Score 0 points 0 points    Immunizations Immunization History  Administered Date(s) Administered   Fluad Quad(high Dose 65+) 09/14/2019, 09/06/2020, 09/10/2021, 10/18/2022   Influenza,inj,Quad PF,6+ Mos 09/06/2014, 08/23/2015, 09/09/2016, 09/22/2017, 09/23/2018   Influenza-Unspecified 08/16/2013   PFIZER(Purple Top)SARS-COV-2 Vaccination 01/20/2020, 02/10/2020, 09/14/2020   Pneumococcal Conjugate-13 02/14/2016   Pneumococcal Polysaccharide-23 01/17/2012   Tdap 12/17/2011   Zoster, Live 12/17/2011    TDAP status: Due, Education has been provided regarding the importance of this vaccine. Advised may receive this vaccine at local pharmacy or Health Dept. Aware to provide a copy of the vaccination record if obtained from local pharmacy or Health Dept. Verbalized acceptance and understanding.  Flu Vaccine status: Up to date  Pneumococcal vaccine status: Up to date  Covid-19 vaccine  status: Completed vaccines  Qualifies for Shingles Vaccine? Yes   Zostavax completed Yes   Shingrix Completed?: No.    Education has been provided regarding the importance of this vaccine. Patient has been advised to call insurance company to determine out of pocket expense if they have not yet received this vaccine. Advised may also receive vaccine at local pharmacy or Health Dept. Verbalized acceptance and understanding.  Screening Tests Health Maintenance  Topic Date Due   Zoster Vaccines- Shingrix (1 of 2) 05/26/1960   DTaP/Tdap/Td (2 - Td or Tdap) 12/16/2021   COVID-19 Vaccine (4 - 2023-24 season) 08/17/2023   INFLUENZA VACCINE  03/15/2024 (Originally 07/17/2023)   Medicare Annual Wellness (AWV)  08/27/2024   Colonoscopy  01/07/2025   Pneumonia Vaccine 79+ Years old  Completed   DEXA SCAN  Completed   HPV VACCINES  Aged Out   Hepatitis C Screening  Discontinued    Health Maintenance  Health Maintenance Due  Topic Date Due   Zoster Vaccines- Shingrix (1 of 2) 05[birth year]26/1960   DTaP/Tdap/Td (2 - Td or Tdap) 12/16/2021   COVID-19 Vaccine (  4 - 2023-24 season) 08/17/2023    Colorectal cancer screening: No longer required. - had 01/07/22   Mammogram status: No longer required due to age.  Bone Density status: Completed 10/17/22. Results reflect: Bone density results: NORMAL. Repeat every 5 years.  Lung Cancer Screening: (Low Dose CT Chest recommended if Age 13-80 years, 20 pack-year currently smoking OR have quit w/in 15years.) does not qualify.    Additional Screening:  Hepatitis C Screening: does not qualify; Completed 09/05/22  Vision Screening: Recommended annual ophthalmology exams for early detection of glaucoma and other disorders of the eye. Is the patient up to date with their annual eye exam?  Yes  Who is the provider or what is the name of the office in which the patient attends annual eye exams? [facility] If pt is not established with a provider, would they like to  be referred to a provider to establish care? No .   Dental Screening: Recommended annual dental exams for proper oral hygiene   Community Resource Referral / Chronic Care Management: CRR required this visit?  No   CCM required this visit?  No     Plan:     I have personally reviewed and noted the following in the patient's chart:   Medical and social history Use of alcohol, tobacco or illicit drugs  Current medications and supplements including opioid prescriptions. Patient is currently taking opioid prescriptions. Information provided to patient regarding non-opioid alternatives. Patient advised to discuss non-opioid treatment plan with their provider. Functional ability and status Nutritional status Physical activity Advanced directives List of other physicians Hospitalizations, surgeries, and ER visits in previous 12 months Vitals Screenings to include cognitive, depression, and falls Referrals and appointments  In addition, I have reviewed and discussed with patient certain preventive protocols, quality metrics, and best practice recommendations. A written personalized care plan for preventive services as well as general preventive health recommendations were provided to patient.     Hal Hope, LPN   1/61/0960   After Visit Summary: (MyChart) Due to this being a telephonic visit, the after visit summary with patients personalized plan was offered to patient via MyChart   Nurse Notes: none

## 2023-08-28 NOTE — Patient Instructions (Addendum)
[name] , Thank you for taking time to come for your Medicare Wellness Visit. I appreciate your ongoing commitment to your health goals. Please review the following plan we discussed and let me know if I can assist you in the future.   Referrals/Orders/Follow-Ups/Clinician Recommendations: none  This is a list of the screening recommended for you and due dates:  Health Maintenance  Topic Date Due   Zoster (Shingles) Vaccine (1 of 2) [birth year]26/1960   DTaP/Tdap/Td vaccine (2 - Td or Tdap) 12/16/2021   COVID-19 Vaccine (4 - 2023-24 season) 08/17/2023   Flu Shot  03/15/2024*   Medicare Annual Wellness Visit  08/27/2024   Colon Cancer Screening  01/07/2025   Pneumonia Vaccine  Completed   DEXA scan (bone density measurement)  Completed   HPV Vaccine  Aged Out   Hepatitis C Screening  Discontinued  *Topic was postponed. The date shown is not the original due date.    Advanced directives: (ACP Link)Information on Advanced Care Planning can be found at [facility] Directives Advance Health Care Directives ([email or web address]   Next Medicare Annual Wellness Visit scheduled for next year: Yes   09/02/24 @ 10:45 am by phone

## 2023-08-29 ENCOUNTER — Other Ambulatory Visit: Payer: Self-pay | Admitting: Internal Medicine

## 2023-08-29 DIAGNOSIS — M5451 Vertebrogenic low back pain: Secondary | ICD-10-CM | POA: Diagnosis not present

## 2023-08-29 DIAGNOSIS — M9903 Segmental and somatic dysfunction of lumbar region: Secondary | ICD-10-CM | POA: Diagnosis not present

## 2023-08-29 NOTE — Telephone Encounter (Signed)
Requested medication (s) are due for refill today: yes  Requested medication (s) are on the active medication list: yes  Last refill:  04/07/23 #30  Future visit scheduled:yes  Notes to clinic:  med not delegated to NT to RF   Requested Prescriptions  Pending Prescriptions Disp Refills   cyclobenzaprine (FLEXERIL) 5 MG tablet [Pharmacy Med Name: CYCLOBENZAPRINE 5MG  TABLETS] 30 tablet 0    Sig: TAKE 1/2 TABLET(2.5 MG) BY MOUTH AT BEDTIME     Not Delegated - Analgesics:  Muscle Relaxants Failed - 08/29/2023  5:27 AM      Failed - This refill cannot be delegated      Passed - Valid encounter within last 6 months    Recent Outpatient Visits           3 weeks ago Chronic right hip pain   [place] [facility] [place], Salvadore Oxford, NP   4 months ago [facility] [place], Salvadore Oxford, NP   4 months ago Mixed hyperlipidemia   [place] [facility] [place], Salvadore Oxford, NP   11 months ago Encounter for general adult medical examination with abnormal findings   [place] [facility] [place], Salvadore Oxford, NP   1 year ago [employer]   [place] [facility], Salvadore Oxford, NP       Future Appointments             In 1 month [place], Salvadore Oxford, NP [place] [facility], [facility]

## 2023-09-03 ENCOUNTER — Encounter: Payer: Self-pay | Admitting: Cardiovascular Disease

## 2023-09-15 ENCOUNTER — Other Ambulatory Visit: Payer: Self-pay | Admitting: Family Medicine

## 2023-09-15 DIAGNOSIS — I10 Essential (primary) hypertension: Secondary | ICD-10-CM

## 2023-10-06 ENCOUNTER — Ambulatory Visit (INDEPENDENT_AMBULATORY_CARE_PROVIDER_SITE_OTHER): Payer: Medicare HMO | Admitting: Internal Medicine

## 2023-10-06 ENCOUNTER — Encounter: Payer: Self-pay | Admitting: Internal Medicine

## 2023-10-06 ENCOUNTER — Telehealth: Payer: Self-pay | Admitting: Cardiovascular Disease

## 2023-10-06 VITALS — BP 120/64 | HR 64 | Ht 60.75 in | Wt 171.0 lb

## 2023-10-06 DIAGNOSIS — E6609 Other obesity due to excess calories: Secondary | ICD-10-CM

## 2023-10-06 DIAGNOSIS — R739 Hyperglycemia, unspecified: Secondary | ICD-10-CM

## 2023-10-06 DIAGNOSIS — F5101 Primary insomnia: Secondary | ICD-10-CM | POA: Diagnosis not present

## 2023-10-06 DIAGNOSIS — Z0001 Encounter for general adult medical examination with abnormal findings: Secondary | ICD-10-CM | POA: Diagnosis not present

## 2023-10-06 DIAGNOSIS — Z1231 Encounter for screening mammogram for malignant neoplasm of breast: Secondary | ICD-10-CM | POA: Diagnosis not present

## 2023-10-06 DIAGNOSIS — E782 Mixed hyperlipidemia: Secondary | ICD-10-CM

## 2023-10-06 DIAGNOSIS — Z6832 Body mass index (BMI) 32.0-32.9, adult: Secondary | ICD-10-CM | POA: Diagnosis not present

## 2023-10-06 DIAGNOSIS — E66811 Obesity, class 1: Secondary | ICD-10-CM | POA: Diagnosis not present

## 2023-10-06 MED ORDER — BELSOMRA 5 MG PO TABS
5.0000 mg | ORAL_TABLET | Freq: Every evening | ORAL | 0 refills | Status: DC | PRN
Start: 2023-10-06 — End: 2024-10-07

## 2023-10-06 NOTE — Telephone Encounter (Signed)
I called and spoke with the patient. She is calling with concerns of continued variation of BP / HR.  She advised that Dr. Mariah Milling has tried to lower her dose of losartan, but her BP would go up - highest she saw on losartan 25 mg once daily was 140/70's. She went back to losartan 50 mg daily and decreased atenolol to 12.5 mg once daily and her HR would go up 93 bpm.   The patient is currently taking: Atenolol 25 mg- 0.5 tablet (12.5 mg) BID Losartan 50 mg- 0.5 tablet (25 mg) BID Hydrochlorothiazide 25 mg- 1 tablet every day  Lowest HR's are dipping down to 40-42 bpm.   BP/ HR readings from home: ?/? (48) on 10/17 115/53 (53) on 10/19 142/77 (48) -today prior to medications Meds taken at 10 am  123/67 (59)- while on the phone with me  The patient advised she is also suffering from insomnia and joint pains. She is concerned that losartan is contributing to some of her pain and would like Dr. Windell Hummingbird opinion on this as he has mentioned at one time to her that he may try to get her off losartan.  She also wanted Dr. Mariah Milling to know that her Orthopedic MD would like her to start Celebrex, but she will not take this until she has feedback from Dr. Mariah Milling.   The patient is otherwise asymptomatic with her BP/ HR readings.   I advised the patient I will forward her concerns/ readings to Dr. Mariah Milling and that we will be back in touch with her once further recommendations are received.   The patient voices understanding and is agreeable. She was very appreciative of the call back.

## 2023-10-06 NOTE — Patient Instructions (Signed)
Health Maintenance for Postmenopausal Women Menopause is a normal process in which your ability to get pregnant comes to an end. This process happens slowly over many months or years, usually between the ages of 48 and 55. Menopause is complete when you have missed your menstrual period for 12 months. It is important to talk with your health care provider about some of the most common conditions that affect women after menopause (postmenopausal women). These include heart disease, cancer, and bone loss (osteoporosis). Adopting a healthy lifestyle and getting preventive care can help to promote your health and wellness. The actions you take can also lower your chances of developing some of these common conditions. What are the signs and symptoms of menopause? During menopause, you may have the following symptoms: Hot flashes. These can be moderate or severe. Night sweats. Decrease in sex drive. Mood swings. Headaches. Tiredness (fatigue). Irritability. Memory problems. Problems falling asleep or staying asleep. Talk with your health care provider about treatment options for your symptoms. Do I need hormone replacement therapy? Hormone replacement therapy is effective in treating symptoms that are caused by menopause, such as hot flashes and night sweats. Hormone replacement carries certain risks, especially as you become older. If you are thinking about using estrogen or estrogen with progestin, discuss the benefits and risks with your health care provider. How can I reduce my risk for heart disease and stroke? The risk of heart disease, heart attack, and stroke increases as you age. One of the causes may be a change in the body's hormones during menopause. This can affect how your body uses dietary fats, triglycerides, and cholesterol. Heart attack and stroke are medical emergencies. There are many things that you can do to help prevent heart disease and stroke. Watch your blood pressure High  blood pressure causes heart disease and increases the risk of stroke. This is more likely to develop in people who have high blood pressure readings or are overweight. Have your blood pressure checked: Every 3-5 years if you are 18-39 years of age. Every year if you are 40 years old or older. Eat a healthy diet  Eat a diet that includes plenty of vegetables, fruits, low-fat dairy products, and lean protein. Do not eat a lot of foods that are high in solid fats, added sugars, or sodium. Get regular exercise Get regular exercise. This is one of the most important things you can do for your health. Most adults should: Try to exercise for at least 150 minutes each week. The exercise should increase your heart rate and make you sweat (moderate-intensity exercise). Try to do strengthening exercises at least twice each week. Do these in addition to the moderate-intensity exercise. Spend less time sitting. Even light physical activity can be beneficial. Other tips Work with your health care provider to achieve or maintain a healthy weight. Do not use any products that contain nicotine or tobacco. These products include cigarettes, chewing tobacco, and vaping devices, such as e-cigarettes. If you need help quitting, ask your health care provider. Know your numbers. Ask your health care provider to check your cholesterol and your blood sugar (glucose). Continue to have your blood tested as directed by your health care provider. Do I need screening for cancer? Depending on your health history and family history, you may need to have cancer screenings at different stages of your life. This may include screening for: Breast cancer. Cervical cancer. Lung cancer. Colorectal cancer. What is my risk for osteoporosis? After menopause, you may be   at increased risk for osteoporosis. Osteoporosis is a condition in which bone destruction happens more quickly than new bone creation. To help prevent osteoporosis or  the bone fractures that can happen because of osteoporosis, you may take the following actions: If you are 19-50 years old, get at least 1,000 mg of calcium and at least 600 international units (IU) of vitamin D per day. If you are older than age 50 but younger than age 70, get at least 1,200 mg of calcium and at least 600 international units (IU) of vitamin D per day. If you are older than age 70, get at least 1,200 mg of calcium and at least 800 international units (IU) of vitamin D per day. Smoking and drinking excessive alcohol increase the risk of osteoporosis. Eat foods that are rich in calcium and vitamin D, and do weight-bearing exercises several times each week as directed by your health care provider. How does menopause affect my mental health? Depression may occur at any age, but it is more common as you become older. Common symptoms of depression include: Feeling depressed. Changes in sleep patterns. Changes in appetite or eating patterns. Feeling an overall lack of motivation or enjoyment of activities that you previously enjoyed. Frequent crying spells. Talk with your health care provider if you think that you are experiencing any of these symptoms. General instructions See your health care provider for regular wellness exams and vaccines. This may include: Scheduling regular health, dental, and eye exams. Getting and maintaining your vaccines. These include: Influenza vaccine. Get this vaccine each year before the flu season begins. Pneumonia vaccine. Shingles vaccine. Tetanus, diphtheria, and pertussis (Tdap) booster vaccine. Your health care provider may also recommend other immunizations. Tell your health care provider if you have ever been abused or do not feel safe at home. Summary Menopause is a normal process in which your ability to get pregnant comes to an end. This condition causes hot flashes, night sweats, decreased interest in sex, mood swings, headaches, or lack  of sleep. Treatment for this condition may include hormone replacement therapy. Take actions to keep yourself healthy, including exercising regularly, eating a healthy diet, watching your weight, and checking your blood pressure and blood sugar levels. Get screened for cancer and depression. Make sure that you are up to date with all your vaccines. This information is not intended to replace advice given to you by your health care provider. Make sure you discuss any questions you have with your health care provider. Document Revised: 04/23/2021 Document Reviewed: 04/23/2021 Elsevier Patient Education  2024 Elsevier Inc.  

## 2023-10-06 NOTE — Telephone Encounter (Signed)
Pt c/o medication issue:  1. Name of Medication:  losartan (COZAAR) 50 MG tablet atenolol (TENORMIN) 25 MG tablet  2. How are you currently taking this medication (dosage and times per day)?   3. Are you having a reaction (difficulty breathing--STAT)?   4. What is your medication issue?   Patient states her BP/HR are not in sync and she would like to discuss changing to a medication that will regulate them a little better. She states when she cuts back on Losartan her BP rises and when she cuts back on Atenolol her HR rises.

## 2023-10-06 NOTE — Progress Notes (Signed)
Subjective:    Patient ID: Molly Faulkner, female    DOB: [birth year], 82 y.o.   MRN: [id number]  HPI  Patient presents to clinic today for her annual exam.  Flu: 09/2023 Tetanus: 12/2011 COVID: X 3 Pneumovax: 01/2012 Prevnar 13: 02/2016 Zostavax: 12/2011 Shingrix: Never Pap smear: Hysterectomy Mammogram: 08/2020 Bone density: 10/2022 Colon screening: 12/2021 Vision screening: annually Dentist: biannually  Diet: She does eat some meat. She consumes fruits and veggies. She does eat some fried foods. She drinks mostly water. 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     albuterol (VENTOLIN HFA) 108 (90 Base) MCG/ACT inhaler Inhale 2 puffs into the lungs every 4 (four) hours as needed for wheezing or shortness of breath. 8 g 8   atenolol (TENORMIN) 25 MG tablet Take 1 tablet (25 mg total) by mouth daily. 90 tablet 2   b complex vitamins capsule Take 1 capsule by mouth daily.     celecoxib (CELEBREX) 200 MG capsule Take 200 mg by mouth daily.     CREAM BASE EX Apply 1-2 application topically 4 (four) times daily as needed (for arthritis pain.). Penetrex Inflammation Formula (Arnica/Pyridoxine/MSM/Boswellia/Cetyl Myristoleate)     cyclobenzaprine (FLEXERIL) 5 MG tablet TAKE 1/2 TABLET(2.5 MG) BY MOUTH AT BEDTIME 30 tablet 0    hydrochlorothiazide (HYDRODIURIL) 25 MG tablet TAKE 1 TABLET EVERY DAY (WILL NEED TO SCHEDULE AN OFFICE VISIT FOR FURTHER REFILLS) 30 tablet 0   HYDROcodone-acetaminophen (NORCO/VICODIN) 5-325 MG tablet Take 1 tablet by mouth every 8 (eight) hours as needed for moderate pain. 15 tablet 0   hydrOXYzine (ATARAX) 10 MG tablet Take 10 mg by mouth at bedtime.     ibuprofen (ADVIL,MOTRIN) 200 MG tablet Take 200 mg by mouth at bedtime as needed for moderate pain (for arthritis pain.). (Patient not taking: Reported on 08/28/2023)     Lidocaine 4 % PTCH Apply 1 patch topically daily as needed (pain).     losartan (COZAAR) 50 MG tablet TAKE 1 TABLET ( 50 mg) IN THE MORNING  AND TAKE 1 TABLET ( 50 mg) IN THE EVENING 90 tablet 3   mirtazapine (REMERON SOL-TAB) 15 MG disintegrating tablet DISSOLVE 1 TABLET  ON  TONGUE AT BEDTIME 20 tablet 0   No current facility-administered medications for this visit.    Allergies  Allergen Reactions   Avelox [Moxifloxacin Hcl In Nacl] Other (See Comments)    Chills   Trazodone And Nefazodone Swelling   Clarithromycin Other (See Comments)    Unsure of reaction type    Family History  Problem Relation Age of Onset   Breast cancer Mother 72  Arthritis Maternal Grandmother    Diabetes Maternal Grandmother     Social History   Socioeconomic History   Marital status: Single    Spouse name: Not on file   Number of children: Not on file   Years of education: Not on file   Highest education level: Not on file  Occupational History   Not on file  Tobacco Use   Smoking status: Former    Current packs/day: 0.50    Average packs/day: 0.5 packs/day for 7.0 years (3.5 ttl pk-yrs)    Types: Cigarettes   Smokeless tobacco: Never   Tobacco comments:    quit over 40 years ago  Vaping Use   Vaping status: Never Used  Substance and Sexual Activity   Alcohol use: Yes    Alcohol/week: 4.0 standard drinks of alcohol    Types: 4 Glasses of wine per week   Drug use: No    Sexual activity: Not Currently  Other Topics Concern   Not on file  Social History Narrative   Not on file   Social Determinants of Health   Financial Resource Strain: Low Risk  (08/28/2023)   Overall Financial Resource Strain (CARDIA)    Difficulty of Paying Living Expenses: Not very hard  Food Insecurity: No Food Insecurity (08/28/2023)   Hunger Vital Sign    Worried About Running Out of Food in the Last Year: Never true    Ran Out of Food in the Last Year: Never true  Transportation Needs: No Transportation Needs (08/28/2023)   PRAPARE - Administrator, Civil Service (Medical): No    Lack of Transportation (Non-Medical): No  Physical Activity: Insufficiently Active (08/28/2023)   Exercise Vital Sign    Days of Exercise per Week: 2 days    Minutes of Exercise per Session: 20 min  Stress: No Stress Concern Present (08/28/2023)   [employer] of Occupational Health - Occupational Stress Questionnaire    Feeling of Stress : Only a little  Social Connections: Moderately Isolated (08/28/2023)   Social Connection and Isolation Panel [NHANES]    Frequency of Communication with Friends and Family: More than three times a week    Frequency of Social Gatherings with Friends and Family: Never    Attends Religious Services: Never    Database administrator or Organizations: Yes    Attends Engineer, structural: More than 4 times per year    Marital Status: Divorced  Intimate Partner Violence: Not At Risk (08/28/2023)   Humiliation, Afraid, Rape, and Kick questionnaire    Fear of Current or Ex-Partner: No    Emotionally Abused: No    Physically Abused: No    Sexually Abused: No     Constitutional: Denies fever, malaise, fatigue, headache or abrupt weight changes.  HEENT: Denies eye pain, eye redness, ear pain, ringing in the ears, wax buildup, runny nose, nasal congestion, bloody nose, or sore throat. Respiratory: Denies difficulty breathing, shortness of breath,  cough or sputum production.   Cardiovascular: Denies chest pain, chest tightness, palpitations or swelling in the hands or feet.  Gastrointestinal: Denies abdominal pain, bloating, constipation, diarrhea or blood in the stool.  GU: Denies urgency, frequency, pain with urination, burning sensation, blood in urine, odor or discharge. Musculoskeletal: Patient reports joint pain.  Denies decrease in range of motion, difficulty with gait, muscle pain or joint swelling.  Skin: Denies redness, rashes, lesions or ulcercations.  Neurological: Patient reports insomnia, paresthesia of feet.  Denies dizziness, difficulty with memory,  difficulty with speech or problems with balance and coordination.  Psych: Patient has a history of depression.  Denies anxiety, SI/HI.  No other specific complaints in a complete review of systems (except as listed in HPI above).  Objective:   Physical Exam  BP 120/64   Pulse 64   Ht 5' 0.75" (1.543 m)   Wt 171 lb (77.6 kg)   SpO2 97%   BMI 32.58 kg/m   Wt Readings from Last 3 Encounters:  08/05/23 170 lb (77.1 kg)  06/12/23 174 lb (78.9 kg)  04/14/23 171 lb (77.6 kg)    General: Appears her stated age, obese, in NAD. Skin: Warm, dry and intact.  HEENT: Head: normal shape and size; Eyes: sclera white, no icterus, conjunctiva pink, PERRLA and EOMs intact;  Neck:  Neck supple, trachea midline. No masses, lumps or thyromegaly present.  Cardiovascular: Normal rate and rhythm. S1,S2 noted.  No murmur, rubs or gallops noted. No JVD or BLE edema. No carotid bruits noted. Pulmonary/Chest: Normal effort and positive vesicular breath sounds. No respiratory distress. No wheezes, rales or ronchi noted.  Abdomen: Soft and nontender. Normal bowel sounds.  Musculoskeletal: Strength 5/5 BUE/BLE.  No difficulty with gait.  Neurological: Alert and oriented. Cranial nerves II-XII grossly intact. Coordination normal.  Psychiatric: Mood and affect normal. Behavior is normal.  Judgment and thought content normal.    BMET    Component Value Date/Time   NA 140 04/07/2023 1442   NA 140 01/10/2020 0950   K 4.4 04/07/2023 1442   CL 107 04/07/2023 1442   CO2 29 04/07/2023 1442   GLUCOSE 86 04/07/2023 1442   BUN 23 04/07/2023 1442   BUN 15 01/10/2020 0950   CREATININE 0.85 04/07/2023 1442   CALCIUM 11.1 (H) 04/07/2023 1442   GFRNONAA 64 01/10/2020 0950   GFRAA 74 01/10/2020 0950    Lipid Panel     Component Value Date/Time   CHOL 163 04/07/2023 1442   CHOL 203 (H) 01/10/2020 0950   TRIG 56 04/07/2023 1442   HDL 62 04/07/2023 1442   HDL 63 01/10/2020 0950   CHOLHDL 2.6 04/07/2023 1442   VLDL 9 03/22/2020 1139   LDLCALC 87 04/07/2023 1442    CBC    Component Value Date/Time   WBC 5.5 04/07/2023 1442   RBC 3.95 04/07/2023 1442   HGB 12.8 04/07/2023 1442   HGB 13.9 01/10/2020 0950   HCT 37.6 04/07/2023 1442   HCT 40.1 01/10/2020 0950   PLT 217 04/07/2023 1442   PLT 227 01/10/2020 0950   MCV 95.2 04/07/2023 1442   MCV 97 01/10/2020 0950   MCH 32.4 04/07/2023 1442   MCHC 34.0 04/07/2023 1442   RDW 11.9 04/07/2023 1442   RDW 12.3 01/10/2020 0950   LYMPHSABS 1.0 01/10/2020 0950   EOSABS 0.3 01/10/2020 0950   BASOSABS 0.0 01/10/2020 0950    Hgb A1C Lab Results  Component Value Date   HGBA1C 5.0 04/07/2023            Assessment & Plan:   Preventative health maintenance:  Flu shot UTD She declines tetanus for financial reasons, advised her if she gets better To go get this done Encouraged her to get her COVID booster Pneumovax and Prevnar 13 UTD, she would like to hold off on prevnar 20 Zostavax UTD Discussed Shingrix vaccine, she will check coverage with her insurance company and schedule visit if she would like to have this done She no longer needs to screen for cervical cancer Mammogram  ordered- she will call to schedule Bone density UTD Colon screening UTD Encouraged her to consume a balanced diet and exercise  regimen Advised her to see an eye doctor and dentist annually We will check CBC, c-Met, lipid, A1c today  RTC in 6 months, follow-up chronic conditions Nicki Reaper, NP

## 2023-10-06 NOTE — Assessment & Plan Note (Signed)
Encouraged diet and exercise for weight loss ?

## 2023-10-06 NOTE — Assessment & Plan Note (Signed)
Will trial Belsomra in place of mirtazapine

## 2023-10-06 NOTE — Telephone Encounter (Signed)
Attempted to contact pt, but her phone does not accept blocked #s. I am working remotely, so will need someone from the office to call.

## 2023-10-07 LAB — COMPLETE METABOLIC PANEL WITH GFR
AG Ratio: 1.6 (calc) (ref 1.0–2.5)
ALT: 14 U/L (ref 6–29)
AST: 15 U/L (ref 10–35)
Albumin: 4 g/dL (ref 3.6–5.1)
Alkaline phosphatase (APISO): 71 U/L (ref 37–153)
BUN: 21 mg/dL (ref 7–25)
CO2: 27 mmol/L (ref 20–32)
Calcium: 10.9 mg/dL — ABNORMAL HIGH (ref 8.6–10.4)
Chloride: 106 mmol/L (ref 98–110)
Creat: 0.83 mg/dL (ref 0.60–0.95)
Globulin: 2.5 g/dL (ref 1.9–3.7)
Glucose, Bld: 111 mg/dL (ref 65–139)
Potassium: 4.1 mmol/L (ref 3.5–5.3)
Sodium: 141 mmol/L (ref 135–146)
Total Bilirubin: 1.1 mg/dL (ref 0.2–1.2)
Total Protein: 6.5 g/dL (ref 6.1–8.1)
eGFR: 71 mL/min/{1.73_m2} (ref >=60)

## 2023-10-07 LAB — LIPID PANEL
Cholesterol: 185 mg/dL (ref <200)
HDL: 54 mg/dL (ref >=50)
LDL Cholesterol (Calc): 114 mg/dL — ABNORMAL HIGH
Non-HDL Cholesterol (Calc): 131 mg/dL — ABNORMAL HIGH (ref <130)
Total CHOL/HDL Ratio: 3.4 (calc) (ref <5.0)
Triglycerides: 78 mg/dL (ref <150)

## 2023-10-07 LAB — CBC
HCT: 37.1 % (ref 35.0–45.0)
Hemoglobin: 12.6 g/dL (ref 11.7–15.5)
MCH: 32.7 pg (ref 27.0–33.0)
MCHC: 34 g/dL (ref 32.0–36.0)
MCV: 96.4 fL (ref 80.0–100.0)
MPV: 11.9 fL (ref 7.5–12.5)
Platelets: 227 10*3/uL (ref 140–400)
RBC: 3.85 10*6/uL (ref 3.80–5.10)
RDW: 12.3 % (ref 11.0–15.0)
WBC: 6.5 10*3/uL (ref 3.8–10.8)

## 2023-10-07 LAB — HEMOGLOBIN A1C
Hgb A1c MFr Bld: 5 %{Hb} (ref <5.7)
Mean Plasma Glucose: 97 mg/dL
eAG (mmol/L): 5.4 mmol/L

## 2023-10-08 DIAGNOSIS — M9903 Segmental and somatic dysfunction of lumbar region: Secondary | ICD-10-CM | POA: Diagnosis not present

## 2023-10-08 DIAGNOSIS — M5451 Vertebrogenic low back pain: Secondary | ICD-10-CM | POA: Diagnosis not present

## 2023-10-09 NOTE — Telephone Encounter (Signed)
Called patient and notified her of the following recommendations from Dr. Mariah Milling.  For now I would try to continue the HCTZ 25 mg in the morning with atenolol 12.5 mg in the morning and losartan 50 mg in the evening If blood pressure runs high will need losartan 50 twice a day  Okay to take Celexa Things like insomnia and pain can make blood pressure and heart rate labile Thx TGollan   Patient verbalizes understanding.  Patient states that her Orthopedist wants to start her on Celebrex (Celecoxib) 200 MG daily not Celexa and would like to know if Dr. Mariah Milling agrees with her taking Celebrex.

## 2023-10-10 NOTE — Telephone Encounter (Signed)
Called patient and notified her of the following from Dr. Mariah Milling.  Celebrex ok  Thx  TG   Patient verbalizes understanding.

## 2023-10-16 ENCOUNTER — Ambulatory Visit
Admission: RE | Admit: 2023-10-16 | Discharge: 2023-10-16 | Disposition: A | Discharge status: Home / Self Care | Payer: Medicare HMO | Source: Ambulatory Visit | Attending: Internal Medicine | Admitting: Internal Medicine

## 2023-10-16 DIAGNOSIS — Z1231 Encounter for screening mammogram for malignant neoplasm of breast: Secondary | ICD-10-CM | POA: Diagnosis not present

## 2023-11-06 ENCOUNTER — Other Ambulatory Visit: Payer: Self-pay | Admitting: Cardiovascular Disease

## 2023-11-10 DIAGNOSIS — M5451 Vertebrogenic low back pain: Secondary | ICD-10-CM | POA: Diagnosis not present

## 2023-11-10 DIAGNOSIS — M9903 Segmental and somatic dysfunction of lumbar region: Secondary | ICD-10-CM | POA: Diagnosis not present

## 2023-11-19 DIAGNOSIS — M9903 Segmental and somatic dysfunction of lumbar region: Secondary | ICD-10-CM | POA: Diagnosis not present

## 2023-11-19 DIAGNOSIS — M5451 Vertebrogenic low back pain: Secondary | ICD-10-CM | POA: Diagnosis not present

## 2023-11-20 ENCOUNTER — Other Ambulatory Visit: Payer: Self-pay

## 2023-11-20 ENCOUNTER — Telehealth: Payer: Self-pay

## 2023-11-20 ENCOUNTER — Telehealth: Payer: Self-pay | Admitting: Cardiovascular Disease

## 2023-11-20 MED ORDER — LOSARTAN POTASSIUM 50 MG PO TABS: ORAL_TABLET | ORAL | 3 refills | Status: DC

## 2023-11-20 MED ORDER — LOSARTAN POTASSIUM 50 MG PO TABS: 50.0000 mg | ORAL_TABLET | Freq: Every day | ORAL | 3 refills | Status: DC

## 2023-11-20 NOTE — Telephone Encounter (Signed)
Please confirm if we need to change the Rx from Losartan 50 mg one in the am & one in the pm to say Losartan 50 mg one tablet daily.  The telephone note from 10/06/2023 stated to take Losartan 50 mg one tablet daily and if BP runs high then take Losartan 50 mg twice daily.

## 2023-11-20 NOTE — Telephone Encounter (Signed)
*  STAT* If patient is at the pharmacy, call can be transferred to refill team.   1. Which medications need to be refilled? (please list name of each medication and dose if known) HYDROcodone-acetaminophen (NORCO/VICODIN) 5-325 MG tablet   losartan (COZAAR) 50 MG tablet   2. Would you like to learn more about the convenience, safety, & potential cost savings by using the [facility]? No   3. Are you open to using the [facility] No   4. Which pharmacy/location (including street and city if local pharmacy) is medication to be sent to? [id number] - , [place] - [address] AT [address] OF [address]     5. Do they need a 30 day or 90 day supply? 90 Day Supply

## 2023-11-24 DIAGNOSIS — M5451 Vertebrogenic low back pain: Secondary | ICD-10-CM | POA: Diagnosis not present

## 2023-11-24 DIAGNOSIS — M9903 Segmental and somatic dysfunction of lumbar region: Secondary | ICD-10-CM | POA: Diagnosis not present

## 2023-11-27 ENCOUNTER — Encounter: Payer: Self-pay | Admitting: Cardiovascular Disease

## 2023-11-28 ENCOUNTER — Other Ambulatory Visit: Payer: Self-pay

## 2023-11-28 DIAGNOSIS — I10 Essential (primary) hypertension: Secondary | ICD-10-CM

## 2023-11-28 DIAGNOSIS — M5451 Vertebrogenic low back pain: Secondary | ICD-10-CM | POA: Diagnosis not present

## 2023-11-28 DIAGNOSIS — M9903 Segmental and somatic dysfunction of lumbar region: Secondary | ICD-10-CM | POA: Diagnosis not present

## 2023-11-28 MED ORDER — HYDROCHLOROTHIAZIDE 25 MG PO TABS: 25.0000 mg | ORAL_TABLET | Freq: Every day | ORAL | 3 refills | Status: DC

## 2023-12-01 DIAGNOSIS — M9903 Segmental and somatic dysfunction of lumbar region: Secondary | ICD-10-CM | POA: Diagnosis not present

## 2023-12-01 DIAGNOSIS — M5451 Vertebrogenic low back pain: Secondary | ICD-10-CM | POA: Diagnosis not present

## 2023-12-05 DIAGNOSIS — M9903 Segmental and somatic dysfunction of lumbar region: Secondary | ICD-10-CM | POA: Diagnosis not present

## 2023-12-05 DIAGNOSIS — M5451 Vertebrogenic low back pain: Secondary | ICD-10-CM | POA: Diagnosis not present

## 2023-12-08 ENCOUNTER — Encounter: Payer: Self-pay | Admitting: Internal Medicine

## 2023-12-08 DIAGNOSIS — M5451 Vertebrogenic low back pain: Secondary | ICD-10-CM | POA: Diagnosis not present

## 2023-12-08 DIAGNOSIS — M9903 Segmental and somatic dysfunction of lumbar region: Secondary | ICD-10-CM | POA: Diagnosis not present

## 2023-12-08 MED ORDER — HYDROXYZINE HCL 10 MG PO TABS: 10.0000 mg | ORAL_TABLET | Freq: Every day | ORAL | 0 refills | Status: DC

## 2023-12-08 MED ORDER — LEVOCETIRIZINE DIHYDROCHLORIDE 5 MG PO TABS: 5.0000 mg | ORAL_TABLET | Freq: Every evening | ORAL | 0 refills | Status: DC

## 2023-12-25 ENCOUNTER — Telehealth: Payer: Self-pay | Admitting: Internal Medicine

## 2023-12-25 NOTE — Telephone Encounter (Signed)
 Patient states having symptoms of sore throat, cough and runny nose. Pharmacy is [facility]. Patient phone number is [phone].

## 2023-12-25 NOTE — Telephone Encounter (Signed)
 How long have your symptoms been going on for? Since Sunday Any fevers, chills or sweats? Chills and sweats, no fever Any cough? If so are you getting anything up? What color? Cough with yellow sputum Any increased SOB? No Any wheezing?No  Nasal Drainage Negative for Covid on Monday.   Has not used her Albuterol  inhaler.  [facility]

## 2023-12-25 NOTE — Telephone Encounter (Signed)
I notified the patient. Nothing further needed. 

## 2024-02-02 ENCOUNTER — Other Ambulatory Visit: Payer: Self-pay | Admitting: Internal Medicine

## 2024-02-02 NOTE — Telephone Encounter (Signed)
 Requested medication (s) are due for refill today: Yes  Requested medication (s) are on the active medication list: Yes  Last refill:  08/29/23  Future visit scheduled: Yes  Notes to clinic:  Unable to refill per protocol, cannot delegate.      Requested Prescriptions  Pending Prescriptions Disp Refills   cyclobenzaprine (FLEXERIL) 5 MG tablet [Pharmacy Med Name: CYCLOBENZAPRINE 5MG  TABLETS] 30 tablet 0    Sig: TAKE 1/2 TABLET(2.5 MG) BY MOUTH AT BEDTIME     Not Delegated - Analgesics:  Muscle Relaxants Failed - 02/02/2024  5:26 PM      Failed - This refill cannot be delegated      Passed - Valid encounter within last 6 months    Recent Outpatient Visits           3 months ago Encounter for general adult medical examination with abnormal findings   [facility] [place], Salvadore Oxford, NP   6 months ago Chronic right hip pain   [facility] [place], Salvadore Oxford, NP   9 months ago [facility] [place], Salvadore Oxford, NP   10 months ago Mixed hyperlipidemia   [facility] [place], Salvadore Oxford, NP   1 year ago Encounter for general adult medical examination with abnormal findings   [facility] [place], Salvadore Oxford, NP       Future Appointments             In 1 week Erin Fulling, MD [facility] [place]   In 2 months [place], Salvadore Oxford, NP  [facility], [facility]

## 2024-02-04 ENCOUNTER — Other Ambulatory Visit: Payer: Self-pay | Admitting: Cardiovascular Disease

## 2024-02-10 ENCOUNTER — Encounter: Payer: Self-pay | Admitting: Internal Medicine

## 2024-02-12 ENCOUNTER — Ambulatory Visit: Payer: Medicare HMO | Admitting: Internal Medicine

## 2024-02-12 ENCOUNTER — Encounter: Payer: Self-pay | Admitting: Internal Medicine

## 2024-02-12 VITALS — BP 132/76 | HR 64 | Temp 97.6°F | Ht 60.75 in | Wt 172.2 lb

## 2024-02-12 DIAGNOSIS — J4520 Mild intermittent asthma, uncomplicated: Secondary | ICD-10-CM

## 2024-02-12 DIAGNOSIS — G4733 Obstructive sleep apnea (adult) (pediatric): Secondary | ICD-10-CM | POA: Diagnosis not present

## 2024-02-12 MED ORDER — TRELEGY ELLIPTA 100-62.5-25 MCG/ACT IN AEPB: 1.0000 | INHALATION_SPRAY | RESPIRATORY_TRACT | Status: DC | PRN

## 2024-02-12 NOTE — Progress Notes (Unsigned)
 @Patient  ID: Molly Faulkner, female    DOB: 1941/10/23, 83 y.o.   MRN: [id number]  SYNOPSIS 83 year old female, former smoker (3.5-pack-year history).  History significant for HTN, chronic seasonal allergies, hyperlipidemia, asthma and OSA.      CC Follow-up assessment for asthma Follow-up assessment for OSA  HPI: Assessment OSA Patient uses nasal pillows Excellent compliance report Patient uses and benefits from therapy AHI significantly reduced to 0.3 100% compliance for days and greater than 4 hours   Assessment of asthma No exacerbation at this time No evidence of heart failure at this time No evidence or signs of infection at this time No respiratory distress No fevers, chills, nausea, vomiting, diarrhea No evidence of lower extremity edema No evidence hemoptysis  Asthma is well-controlled Well-controlled with avoidance of allergens No maintenance therapy at this time Uses albuterol as needed  Patient had flulike illness 6 weeks ago Patient had asthma exacerbation but dissipated with time Patient has residual cough and some expiratory wheezing She has been using her albuterol inhaler I mentioned that we give her an as needed combined medication as a sample as she is traveling to [place]  I also recommend taking her CPAP machine on her trip   Allergies  Allergen Reactions   Avelox [Moxifloxacin Hcl In Nacl] Other (See Comments)    Chills   Trazodone And Nefazodone Swelling   Clarithromycin Other (See Comments)    Unsure of reaction type    Immunization History  Administered Date(s) Administered   Fluad Quad(high Dose 65+) 09/14/2019, 09/06/2020, 09/10/2021, 10/18/2022   Influenza,inj,Quad PF,6+ Mos 09/06/2014, 08/23/2015, 09/09/2016, 09/22/2017, 09/23/2018   Influenza-Unspecified 08/16/2013   PFIZER(Purple Top)SARS-COV-2 Vaccination 01/20/2020, 02/10/2020, 09/14/2020   Pneumococcal Conjugate-13 02/14/2016   Pneumococcal Polysaccharide-23 01/17/2012    Tdap 12/17/2011   Zoster, Live 12/17/2011    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     albuterol (VENTOLIN HFA) 108 (90 Base) MCG/ACT inhaler Inhale 2 puffs into the lungs every 4 (four) hours as needed for wheezing or shortness of breath. 8 g 8   atenolol (TENORMIN) 25 MG tablet TAKE 1 TABLET EVERY DAY 90 tablet 3   CREAM BASE EX Apply 1-2 application topically 4 (four) times daily as needed (for arthritis pain.). Penetrex Inflammation Formula (Arnica/Pyridoxine/MSM/Boswellia/Cetyl Myristoleate)     cyclobenzaprine (FLEXERIL) 5 MG  tablet TAKE 1/2 TABLET(2.5 MG) BY MOUTH AT BEDTIME 30 tablet 0   hydrochlorothiazide (HYDRODIURIL) 25 MG tablet Take 1 tablet (25 mg total) by mouth daily. TAKE 1 TABLET EVERY DAY 90 tablet 3   HYDROcodone-acetaminophen (NORCO/VICODIN) 5-325 MG tablet Take 1 tablet by mouth every 8 (eight) hours as needed for moderate pain. 15 tablet 0   hydrOXYzine (ATARAX) 10 MG tablet Take 1 tablet (10 mg total) by mouth at  bedtime. 90 tablet 0   levocetirizine (XYZAL) 5 MG tablet Take 1 tablet (5 mg total) by mouth every evening. 90 tablet 0   Lidocaine 4 % PTCH Apply 1 patch topically daily as needed (pain).     losartan (COZAAR) 50 MG tablet Take 1 tablet (50 mg total) by mouth daily. 90 tablet 3   Multiple Vitamins-Minerals (EYE MULTIVITAMIN PO) Take by mouth.     Suvorexant (BELSOMRA) 5 MG TABS Take 1 tablet (5 mg total) by mouth at bedtime as needed. 30 tablet 0   No facility-administered medications prior to visit.   BP 132/76 (BP Location: Left Arm, Patient Position: Sitting, Cuff Size: Normal)   Pulse 64   Temp 97.6 F (36.4 C) (Temporal)   Ht 5' 0.75" (1.543 m)   Wt 172 lb 3.2 oz (78.1 kg)   SpO2 98%   BMI 32.81 kg/m      Review of Systems: Gen:  Denies  fever, sweats, chills weight loss  HEENT: Denies blurred vision, double vision, ear pain, eye pain, hearing loss, nose bleeds, sore throat Cardiac:  No dizziness, chest pain or heaviness, chest tightness,edema, No JVD Resp:   No cough, -sputum production, -shortness of breath,+wheezing, -hemoptysis,  Other:  All other systems negative   Physical Examination:   General Appearance: No distress  EYES PERRLA, EOM intact.   NECK Supple, No JVD Pulmonary: normal breath sounds, No wheezing.  CardiovascularNormal S1,S2.  No m/r/g.   Abdomen: Benign, Soft, non-tender. Neurology UE/LE 5/5 strength, no focal deficits Ext pulses intact, cap refill intact ALL OTHER ROS ARE NEGATIVE  CBC    Component Value Date/Time   WBC 6.5 10/06/2023 1421   RBC 3.85 10/06/2023 1421   HGB 12.6 10/06/2023 1421   HGB 13.9 01/10/2020 0950   HCT 37.1 10/06/2023 1421   HCT 40.1 01/10/2020 0950   PLT 227 10/06/2023 1421   PLT 227 01/10/2020 0950   MCV 96.4 10/06/2023 1421   MCV 97 01/10/2020 0950   MCH 32.7 10/06/2023 1421   MCHC 34.0 10/06/2023 1421   RDW 12.3 10/06/2023 1421   RDW 12.3 01/10/2020 0950   LYMPHSABS 1.0 01/10/2020 0950   EOSABS 0.3  01/10/2020 0950   BASOSABS 0.0 01/10/2020 0950    BMET    Component Value Date/Time   NA 141 10/06/2023 1421   NA 140 01/10/2020 0950   K 4.1 10/06/2023 1421   CL 106 10/06/2023 1421   CO2 27 10/06/2023 1421   GLUCOSE 111 10/06/2023 1421   BUN 21 10/06/2023 1421   BUN 15 01/10/2020 0950   CREATININE 0.83 10/06/2023 1421   CALCIUM 10.9 (H) 10/06/2023 1421   GFRNONAA 64 01/10/2020 0950   GFRAA 74 01/10/2020 0950      Assessment & Plan:   83 year old pleasant female seen today for follow-up assessment for sleep apnea as well as follow-up assessment for asthma  Assessment of Sleep apnea Patient has excellent compliance report Discussed in detail with patient OSA is well-controlled with CPAP Continue current prescription  Patient Instructions Continue to  use CPAP every night, minimum of 4-6 hours a night.  Change equipment every 30 days or as directed by DME.  Wash your tubing with warm soap and water daily, hang to dry. Wash humidifier portion weekly. Use bottled, distilled water and change daily   Be aware of reduced alertness and do not drive or operate heavy machinery if experiencing this or drowsiness.  Exercise encouraged, as tolerated. Encouraged proper weight management.  Important to get eight or more hours of sleep  Limiting the use of the computer and television before bedtime.  Decrease naps during the day, so night time sleep will become enhanced.  Limit caffeine, and sleep deprivation.  HTN, stroke, uncontrolled diabetes and heart failure are potential risk factors.  Risk of untreated sleep apnea including cardiac arrhthymias, stroke, DM, pulm HTN.    Assessment of asthma Mild intermittent well-controlled No exacerbation at this time No indication for maintenance therapy Use albuterol as needed Can use Trelegy inhaler as needed we will provide sample   MEDICATION ADJUSTMENTS/LABS AND TESTS ORDERED: Continue CPAP as prescribed Use Trelegy inhaler as  needed Use albuterol 2 puffs every 4-6 hours as needed Avoid secondhand Avoid agents  avoid sick contacts   CURRENT MEDICATIONS REVIEWED AT LENGTH WITH PATIENT TODAY   Patient  satisfied with Plan of action and management. All questions answered  Follow up 6 months  Total time spent 42 minutes    Lucie Leather, M.D.  [facility]  Medical Director [facility] Medical Director [facility] Cardio-Pulmonary Department

## 2024-02-12 NOTE — Patient Instructions (Addendum)
 Excellent Job A+ GOLD STAR!!  Continue CPAP as prescribed  Use albuterol as needed Will give sample of Trelegy and use as needed  Avoid Allergens and Irritants Avoid secondhand smoke Avoid SICK contacts Recommend  Masking  when appropriate Recommend Keep up-to-date with vaccinations   Be aware of reduced alertness and do not drive or operate heavy machinery if experiencing this or drowsiness.  Exercise encouraged, as tolerated. Encouraged proper weight management.  Important to get eight or more hours of sleep  Limiting the use of the computer and television before bedtime.  Decrease naps during the day, so night time sleep will become enhanced.  Limit caffeine, and sleep deprivation.

## 2024-03-04 ENCOUNTER — Other Ambulatory Visit: Payer: Self-pay | Admitting: Internal Medicine

## 2024-03-05 NOTE — Telephone Encounter (Signed)
 Requested Prescriptions  Pending Prescriptions Disp Refills   levocetirizine (XYZAL) 5 MG tablet [Pharmacy Med Name: LEVOCETIRIZINE 5MG  TABLETS] 90 tablet 0    Sig: TAKE 1 TABLET(5 MG) BY MOUTH EVERY EVENING     Ear, Nose, and Throat:  Antihistamines - levocetirizine dihydrochloride Passed - 03/05/2024 11:43 AM      Passed - Cr in normal range and within 360 days    Creat  Date Value Ref Range Status  10/06/2023 0.83 0.60 - 0.95 mg/dL Final         Passed - eGFR is 10 or above and within 360 days    GFR calc Af Amer  Date Value Ref Range Status  01/10/2020 74 >59 mL/min/1.73 Final   GFR calc non Af Amer  Date Value Ref Range Status  01/10/2020 64 >59 mL/min/1.73 Final   GFR  Date Value Ref Range Status  12/05/2020 60.84 >60.00 mL/min Final    Comment:    Calculated using the CKD-EPI Creatinine Equation (2021)   eGFR  Date Value Ref Range Status  10/06/2023 71 > OR = 60 mL/min/1.35m2 Final         Passed - Valid encounter within last 12 months    Recent Outpatient Visits           5 months ago Encounter for general adult medical examination with abnormal findings   [place] [facility] [place], Salvadore Oxford, NP   7 months ago Chronic right hip pain   [place] [facility] [place], Salvadore Oxford, NP   10 months ago [facility] [place], Salvadore Oxford, NP   11 months ago Mixed hyperlipidemia   [place] [facility] [place] [place], Salvadore Oxford, NP   1 year ago Encounter for general adult medical examination with abnormal findings   [place] [facility] [place], Salvadore Oxford, NP       Future Appointments             In 1 month [place], Salvadore Oxford, NP [place] [facility], [facility]

## 2024-04-05 ENCOUNTER — Ambulatory Visit (INDEPENDENT_AMBULATORY_CARE_PROVIDER_SITE_OTHER): Payer: Self-pay | Admitting: Internal Medicine

## 2024-04-05 ENCOUNTER — Encounter: Payer: Self-pay | Admitting: Internal Medicine

## 2024-04-05 VITALS — BP 130/68 | Ht 60.75 in | Wt 173.0 lb

## 2024-04-05 DIAGNOSIS — K219 Gastro-esophageal reflux disease without esophagitis: Secondary | ICD-10-CM

## 2024-04-05 DIAGNOSIS — F329 Major depressive disorder, single episode, unspecified: Secondary | ICD-10-CM

## 2024-04-05 DIAGNOSIS — J4540 Moderate persistent asthma, uncomplicated: Secondary | ICD-10-CM

## 2024-04-05 DIAGNOSIS — R739 Hyperglycemia, unspecified: Secondary | ICD-10-CM

## 2024-04-05 DIAGNOSIS — E782 Mixed hyperlipidemia: Secondary | ICD-10-CM | POA: Diagnosis not present

## 2024-04-05 DIAGNOSIS — G588 Other specified mononeuropathies: Secondary | ICD-10-CM | POA: Diagnosis not present

## 2024-04-05 DIAGNOSIS — I10 Essential (primary) hypertension: Secondary | ICD-10-CM | POA: Diagnosis not present

## 2024-04-05 DIAGNOSIS — I700 Atherosclerosis of aorta: Secondary | ICD-10-CM | POA: Insufficient documentation

## 2024-04-05 DIAGNOSIS — M479 Spondylosis, unspecified: Secondary | ICD-10-CM

## 2024-04-05 DIAGNOSIS — F5101 Primary insomnia: Secondary | ICD-10-CM

## 2024-04-05 DIAGNOSIS — G4733 Obstructive sleep apnea (adult) (pediatric): Secondary | ICD-10-CM

## 2024-04-05 DIAGNOSIS — E66811 Obesity, class 1: Secondary | ICD-10-CM

## 2024-04-05 DIAGNOSIS — G629 Polyneuropathy, unspecified: Secondary | ICD-10-CM | POA: Insufficient documentation

## 2024-04-05 MED ORDER — ASPIRIN 81 MG PO TBEC: 81.0000 mg | DELAYED_RELEASE_TABLET | Freq: Every day | ORAL | Status: AC

## 2024-04-05 NOTE — Assessment & Plan Note (Signed)
 Encouraged diet and exercise for weight loss ?

## 2024-04-05 NOTE — Patient Instructions (Signed)
 Neuropathic Pain Neuropathic pain is pain caused by damage to the nerves that are responsible for certain sensations in your body (sensory nerves). Neuropathic pain can make you more sensitive to pain. Even a minor sensation can feel very painful. This is usually a long-term (chronic) condition that can be difficult to treat. The type of pain differs from person to person. It may: Start suddenly (acute), or it may develop slowly and become chronic. Come and go as damaged nerves heal, or it may stay at the same level for years. Cause emotional distress, loss of sleep, and a lower quality of life. What are the causes? The most common cause of this condition is diabetes. Many other diseases and conditions can also cause neuropathic pain. Causes of neuropathic pain can be classified as: Toxic. This is caused by medicines and chemicals. The most common causes of toxic neuropathic pain is damage from medicines that kill cancer cells (chemotherapy) or alcohol abuse. Metabolic. This can be caused by: Diabetes. Lack of vitamins like B12. Traumatic. Any injury that cuts, crushes, or stretches a nerve can cause damage and pain. Compression-related. If a sensory nerve gets trapped or compressed for a long period of time, the blood supply to the nerve can be cut off. Vascular. Many blood vessel diseases can cause neuropathic pain by decreasing blood supply and oxygen to nerves. Autoimmune. This type of pain results from diseases in which the body's defense system (immune system) mistakenly attacks sensory nerves. Examples of autoimmune diseases that can cause neuropathic pain include lupus and multiple sclerosis. Infectious. Many types of viral infections can damage sensory nerves and cause pain. Shingles infection is a common cause of this type of pain. Inherited. Neuropathic pain can be a symptom of many diseases that are passed down through families (genetic). What increases the risk? You are more likely to  develop this condition if: You have diabetes. You smoke. You drink too much alcohol. You are taking certain medicines, including chemotherapy or medicines that treat immune system disorders. What are the signs or symptoms? The main symptom is pain. Neuropathic pain is often described as: Burning. Shock-like. Stinging. Hot or cold. Itching. How is this diagnosed? No single test can diagnose neuropathic pain. It is diagnosed based on: A physical exam and your symptoms. Your health care provider will ask you about your pain. You may be asked to use a pain scale to describe how bad your pain is. Tests. These may be done to see if you have a cause and location of any nerve damage. They include: Nerve conduction studies and electromyography to test how well nerve signals travel through your nerves and muscles (electrodiagnostic testing). Skin biopsy to evaluate for small fiber neuropathy. Imaging studies, such as: X-rays. CT scan. MRI. How is this treated? Treatment for neuropathic pain may change over time. You may need to try different treatment options or a combination of treatments. Some options include: Treating the underlying cause of the neuropathy, such as diabetes, kidney disease, or vitamin deficiencies. Stopping medicines that can cause neuropathy, such as chemotherapy. Medicine to relieve pain. Medicines may include: Prescription or over-the-counter pain medicine. Anti-seizure medicine. Antidepressant medicines. Pain-relieving patches or creams that are applied to painful areas of skin. A medicine to numb the area (local anesthetic), which can be injected as a nerve block. Transcutaneous nerve stimulation. This uses electrical currents to block painful nerve signals. The treatment is painless. Alternative treatments, such as: Acupuncture. Meditation. Massage. Occupational or physical therapy. Pain management programs. Counseling. Follow  these instructions at  home: Medicines  Take over-the-counter and prescription medicines only as told by your health care provider. Ask your health care provider if the medicine prescribed to you: Requires you to avoid driving or using machinery. Can cause constipation. You may need to take these actions to prevent or treat constipation: Drink enough fluid to keep your urine pale yellow. Take over-the-counter or prescription medicines. Eat foods that are high in fiber, such as beans, whole grains, and fresh fruits and vegetables. Limit foods that are high in fat and processed sugars, such as fried or sweet foods. Lifestyle  Have a good support system at home. Consider joining a chronic pain support group. Do not use any products that contain nicotine or tobacco. These products include cigarettes, chewing tobacco, and vaping devices, such as e-cigarettes. If you need help quitting, ask your health care provider. Do not drink alcohol. General instructions Learn as much as you can about your condition. Work closely with all your health care providers to find the treatment plan that works best for you. Ask your health care provider what activities are safe for you. Keep all follow-up visits. This is important. Contact a health care provider if: Your pain treatments are not working. You are having side effects from your medicines. You are struggling with tiredness (fatigue), mood changes, depression, or anxiety. Get help right away if: You have thoughts of hurting yourself. Get help right away if you feel like you may hurt yourself or others, or have thoughts about taking your own life. Go to your nearest emergency room or: Call 911. Call the National Suicide Prevention Lifeline at 947-472-5826 or 988. This is open 24 hours a day. Text the Crisis Text Line at 657-070-3362. Summary Neuropathic pain is pain caused by damage to the nerves that are responsible for certain sensations in your body (sensory  nerves). Neuropathic pain may come and go as damaged nerves heal, or it may stay at the same level for years. Neuropathic pain is usually a long-term condition that can be difficult to treat. Consider joining a chronic pain support group. This information is not intended to replace advice given to you by your health care provider. Make sure you discuss any questions you have with your health care provider. Document Revised: 07/30/2021 Document Reviewed: 07/30/2021 Elsevier Patient Education  2024 [employer].

## 2024-04-05 NOTE — Assessment & Plan Note (Signed)
 Continue albuterol as needed

## 2024-04-05 NOTE — Assessment & Plan Note (Signed)
 Encourage weight loss as this can help reduce sleep apnea symptoms Continue CPAP use

## 2024-04-05 NOTE — Progress Notes (Signed)
 Subjective:    Patient ID: Molly Faulkner, female    DOB: 09/04/1941, 83 y.o.   MRN: [id number]  HPI  Patient presents to clinic today for follow-up of chronic conditions.  OA: Mainly in her hands and back.  She takes cyclobenzaprine  and hydrocodone  as needed with some relief of symptoms.  She follows with [facility].  Asthma: She reports chronic cough but denies shortness of breath.  She is using albuterol  as needed.  There are no PFTs on file.  She follows with pulmonology.  Depression: Chronic, she is not currently taking any medications for this but has been on mirtazapine  in the past.  She is not currently seeing her therapist.  She denies anxiety, SI/HI.  GERD: Triggered by acidic foods.  She is not currently taking any medications for this.  There is no upper GI on file.  HLD with aortic atherosclerosis: Her last LDL was 114, triglycerides 78, 09/2023.  She is not taking any cholesterol-lowering medication at this time.  She tries to consume low-fat diet.  HTN: Her BP today is 130/68.  She is taking losartan , HCTZ and atenolol  as prescribed.  ECG from 05/2023 reviewed.  Insomnia/OSA: She averages hours of sleep per night with the use of her CPAP.  She takes belsomra  as needed with minimal relief of symptoms.  Sleep study from 08/2016 reviewed.  Review of Systems     Past Medical History:  Diagnosis Date  . Allergy   . Arthritis   . Asthma   . Carpal tunnel syndrome 12/2017   left, referred to neurology for EMG testing  . 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/2017  . Chicken pox   . Depression   . Diverticulitis   . Diverticulosis   . Dysrhythmia    paroxysmal tachycardia  . GERD (gastroesophageal reflux disease)   . Hx of colonic polyps   . Hyperlipidemia   . Hypertension   . Insomnia   . 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 Sleep apnea    H/O   NO LONGER  . Syncope     Current Outpatient Medications  Medication Sig Dispense Refill  . acetaminophen  (TYLENOL ) 500 MG tablet Take  500 mg by mouth every 8 (eight) hours as needed for moderate pain.    . albuterol  (VENTOLIN  HFA) 108 (90 Base) MCG/ACT inhaler Inhale 2 puffs into the lungs every 4 (four) hours as needed for wheezing or shortness of breath. 8 g 8  . atenolol  (TENORMIN ) 25 MG tablet TAKE 1 TABLET EVERY DAY 90 tablet 3  . CREAM BASE EX Apply 1-2 application topically 4 (four) times daily as needed (for arthritis pain.). Penetrex Inflammation Formula (Arnica/Pyridoxine/MSM/Boswellia/Cetyl Myristoleate)    . cyclobenzaprine  (FLEXERIL ) 5 MG tablet TAKE 1/2 TABLET(2.5 MG) BY MOUTH AT BEDTIME 30 tablet 0  . Fluticasone -Umeclidin-Vilant (TRELEGY ELLIPTA ) 100-62.5-25 MCG/ACT AEPB Inhale 1 puff into the lungs as needed.    . hydrochlorothiazide  (HYDRODIURIL ) 25 MG tablet Take 1 tablet (25 mg total) by mouth daily. TAKE 1 TABLET EVERY DAY 90 tablet 3  . HYDROcodone -acetaminophen  (NORCO/VICODIN) 5-325 MG tablet Take 1 tablet by mouth every 8 (eight) hours as needed for moderate pain. 15 tablet 0  . hydrOXYzine  (ATARAX ) 10 MG tablet Take 1 tablet (10 mg total) by mouth at bedtime. 90 tablet 0  . ibuprofen (ADVIL) 400 MG tablet Take 400 mg by mouth as needed for moderate pain (pain score 4-6).    Aaron Aas levocetirizine (XYZAL ) 5 MG tablet TAKE 1 TABLET(5 MG) BY MOUTH  EVERY EVENING 90 tablet 1  . Lidocaine  4 % PTCH Apply 1 patch topically daily as needed (pain).    . losartan  (COZAAR ) 50 MG tablet Take 1 tablet (50 mg total) by mouth daily. 90 tablet 3  . Multiple Vitamins-Minerals (EYE MULTIVITAMIN PO) Take by mouth. Ultimate Eye    . Suvorexant  (BELSOMRA ) 5 MG TABS Take 1 tablet (5 mg total) by mouth at bedtime as needed. 30 tablet 0   No current facility-administered medications for this visit.    Allergies  Allergen Reactions  . Avelox  [Moxifloxacin  Hcl In Nacl] Other (See Comments)    Chills  . Trazodone  And Nefazodone Swelling  . Clarithromycin Other (See Comments)    Unsure of reaction type    Family History  Problem  Relation Age of Onset  . Breast cancer Mother 51  . Arthritis Maternal Grandmother   . Diabetes Maternal Grandmother     Social History   Socioeconomic History  . Marital status: Single    Spouse name: Not on file  . Number of children: Not on file  . Years of education: Not on file  . Highest education level: Not on file  Occupational History  . Not on file  Tobacco Use  . Smoking status: Former    Current packs/day: 0.50    Average packs/day: 0.5 packs/day for 7.0 years (3.5 ttl pk-yrs)    Types: Cigarettes  . Smokeless tobacco: Never  . Tobacco comments:    quit over 40 years ago  Vaping Use  . Vaping status: Never Used  Substance and Sexual Activity  . Alcohol use: Yes    Alcohol/week: 4.0 standard drinks of alcohol    Types: 4 Glasses of wine per week  . Drug use: No  . Sexual activity: Not Currently  Other Topics Concern  . Not on file  Social History Narrative  . Not on file   Social Drivers of Health   Financial Resource Strain: Low Risk  (08/28/2023)   Overall Financial Resource Strain (CARDIA)   . Difficulty of Paying Living Expenses: Not very hard  Food Insecurity: No Food Insecurity (08/28/2023)   Hunger Vital Sign   . Worried About Programme researcher, broadcasting/film/video in the Last Year: Never true   . Ran Out of Food in the Last Year: Never true  Transportation Needs: No Transportation Needs (08/28/2023)   PRAPARE - Transportation   . Lack of Transportation (Medical): No   . Lack of Transportation (Non-Medical): No  Physical Activity: Insufficiently Active (08/28/2023)   Exercise Vital Sign   . Days of Exercise per Week: 2 days   . Minutes of Exercise per Session: 20 min  Stress: No Stress Concern Present (08/28/2023)   [employer] of Occupational Health - Occupational Stress Questionnaire   . Feeling of Stress : Only a little  Social Connections: Moderately Isolated (08/28/2023)   Social Connection and Isolation Panel [NHANES]   . Frequency of Communication with  Friends and Family: More than three times a week   . Frequency of Social Gatherings with Friends and Family: Never   . Attends Religious Services: Never   . Active Member of Clubs or Organizations: Yes   . Attends Banker Meetings: More than 4 times per year   . Marital Status: Divorced  Catering manager Violence: Not At Risk (08/28/2023)   Humiliation, Afraid, Rape, and Kick questionnaire   . Fear of Current or Ex-Partner: No   . Emotionally Abused: No   . Physically  Abused: No   . Sexually Abused: No     Constitutional: Denies fever, malaise, fatigue, headache or abrupt weight changes.  HEENT: Denies eye pain, eye redness, ear pain, ringing in the ears, wax buildup, runny nose, nasal congestion, bloody nose, or sore throat. Respiratory: Denies difficulty breathing, shortness of breath, cough or sputum production.   Cardiovascular: Denies chest pain, chest tightness, palpitations or swelling in the hands or feet.  Gastrointestinal: Denies abdominal pain, bloating, constipation, diarrhea or blood in the stool.  GU: Denies urgency, frequency, pain with urination, burning sensation, blood in urine, odor or discharge. Musculoskeletal: Denies decrease in range of motion, difficulty with gait, muscle pain or joint pain and swelling.  Skin: Denies redness, rashes, lesions or ulcercations.  Neurological: Patient reports insomnia, intermittent paresthesias of lower extremities.  Denies dizziness, difficulty with memory, difficulty with speech or problems with balance and coordination.  Psych: Patient has a history of depression.  Denies anxiety, SI/HI.  No other specific complaints in a complete review of systems (except as listed in HPI above).   Objective:   Physical Exam BP 130/68 (BP Location: Left Arm, Patient Position: Sitting, Cuff Size: Normal)   Ht 5' 0.75" (1.543 m)   Wt 173 lb (78.5 kg)   BMI 32.96 kg/m    Wt Readings from Last 3 Encounters:  02/12/24 172 lb 3.2  oz (78.1 kg)  10/06/23 171 lb (77.6 kg)  08/05/23 170 lb (77.1 kg)    General: Appears her stated age, obese, in NAD. Skin: Warm, dry and intact. No rashes noted. HEENT: Head: normal shape and size; Eyes: sclera white, no icterus, conjunctiva pink, PERRLA and EOMs intact;  Cardiovascular: Bradycardic with normal rhythm. S1,S2 noted.  No murmur, rubs or gallops noted. No JVD or BLE edema. No carotid bruits noted. Pulmonary/Chest: Normal effort and positive vesicular breath sounds. No respiratory distress. No wheezes, rales or ronchi noted.  Abdomen: Normal bowel sounds.  Musculoskeletal: No difficulty with gait.  Neurological: Alert and oriented. Coordination normal.  Psychiatric: Mood and affect normal. Behavior is normal. Judgment and thought content normal.     BMET    Component Value Date/Time   NA 141 10/06/2023 1421   NA 140 01/10/2020 0950   K 4.1 10/06/2023 1421   CL 106 10/06/2023 1421   CO2 27 10/06/2023 1421   GLUCOSE 111 10/06/2023 1421   BUN 21 10/06/2023 1421   BUN 15 01/10/2020 0950   CREATININE 0.83 10/06/2023 1421   CALCIUM  10.9 (H) 10/06/2023 1421   GFRNONAA 64 01/10/2020 0950   GFRAA 74 01/10/2020 0950    Lipid Panel     Component Value Date/Time   CHOL 185 10/06/2023 1421   CHOL 203 (H) 01/10/2020 0950   TRIG 78 10/06/2023 1421   HDL 54 10/06/2023 1421   HDL 63 01/10/2020 0950   CHOLHDL 3.4 10/06/2023 1421   VLDL 9 03/22/2020 1139   LDLCALC 114 (H) 10/06/2023 1421    CBC    Component Value Date/Time   WBC 6.5 10/06/2023 1421   RBC 3.85 10/06/2023 1421   HGB 12.6 10/06/2023 1421   HGB 13.9 01/10/2020 0950   HCT 37.1 10/06/2023 1421   HCT 40.1 01/10/2020 0950   PLT 227 10/06/2023 1421   PLT 227 01/10/2020 0950   MCV 96.4 10/06/2023 1421   MCV 97 01/10/2020 0950   MCH 32.7 10/06/2023 1421   MCHC 34.0 10/06/2023 1421   RDW 12.3 10/06/2023 1421   RDW 12.3 01/10/2020 0950  LYMPHSABS 1.0 01/10/2020 0950   EOSABS 0.3 01/10/2020 0950    BASOSABS 0.0 01/10/2020 0950    Hgb A1C Lab Results  Component Value Date   HGBA1C 5.0 10/06/2023            Assessment & Plan:      RTC in 6 months for your annual exam Helayne Lo, NP

## 2024-04-05 NOTE — Assessment & Plan Note (Signed)
Controlled on losartan, HCTZ and atenolol Reinforced DASH diet and exercise for weight loss C-Met today

## 2024-04-05 NOTE — Assessment & Plan Note (Signed)
Avoid foods that trigger reflux Encourage weight loss as this can help reduce reflux symptoms Okay to take Tums OTC as needed

## 2024-04-05 NOTE — Assessment & Plan Note (Signed)
 Not medicated Will monitor

## 2024-04-05 NOTE — Assessment & Plan Note (Signed)
 C-Met and lipid profile today Encouraged her to consume a low-fat diet

## 2024-04-05 NOTE — Assessment & Plan Note (Signed)
 Discussed LDL goal <70 C-Met and lipid profile today Encouraged her to consume a low-fat diet Start baby aspirin  81 mg daily

## 2024-04-05 NOTE — Assessment & Plan Note (Signed)
 Will check vitamin D  and B12 levels today Referral to neurology for further evaluation and treatment

## 2024-04-05 NOTE — Assessment & Plan Note (Signed)
 She did not like the way Belsomra  made her feel Encouraged her to try melatonin up to 10 mg nightly

## 2024-04-05 NOTE — Assessment & Plan Note (Signed)
 Encourage regular stretching and core strengthening Continue cyclobenzaprine  and hydrocodone 

## 2024-04-06 ENCOUNTER — Encounter: Payer: Self-pay | Admitting: Internal Medicine

## 2024-04-06 LAB — COMPREHENSIVE METABOLIC PANEL WITH GFR
AG Ratio: 1.7 (calc) (ref 1.0–2.5)
ALT: 13 U/L (ref 6–29)
AST: 18 U/L (ref 10–35)
Albumin: 4 g/dL (ref 3.6–5.1)
Alkaline phosphatase (APISO): 75 U/L (ref 37–153)
BUN: 22 mg/dL (ref 7–25)
CO2: 28 mmol/L (ref 20–32)
Calcium: 10.4 mg/dL (ref 8.6–10.4)
Chloride: 105 mmol/L (ref 98–110)
Creat: 0.94 mg/dL (ref 0.60–0.95)
Globulin: 2.3 g/dL (ref 1.9–3.7)
Glucose, Bld: 94 mg/dL (ref 65–99)
Potassium: 4.1 mmol/L (ref 3.5–5.3)
Sodium: 140 mmol/L (ref 135–146)
Total Bilirubin: 1.2 mg/dL (ref 0.2–1.2)
Total Protein: 6.3 g/dL (ref 6.1–8.1)
eGFR: 61 mL/min/{1.73_m2} (ref >=60)

## 2024-04-06 LAB — LIPID PANEL
Cholesterol: 178 mg/dL
HDL: 60 mg/dL
LDL Cholesterol (Calc): 103 mg/dL — ABNORMAL HIGH
Non-HDL Cholesterol (Calc): 118 mg/dL
Total CHOL/HDL Ratio: 3 (calc)
Triglycerides: 61 mg/dL

## 2024-04-06 LAB — CBC
HCT: 36.1 % (ref 35.0–45.0)
Hemoglobin: 12.6 g/dL (ref 11.7–15.5)
MCH: 33 pg (ref 27.0–33.0)
MCHC: 34.9 g/dL (ref 32.0–36.0)
MCV: 94.5 fL (ref 80.0–100.0)
MPV: 11.3 fL (ref 7.5–12.5)
Platelets: 191 Thousand/uL (ref 140–400)
RBC: 3.82 Million/uL (ref 3.80–5.10)
RDW: 12.1 % (ref 11.0–15.0)
WBC: 5.9 Thousand/uL (ref 3.8–10.8)

## 2024-04-06 LAB — HEMOGLOBIN A1C
Hgb A1c MFr Bld: 5 %
Mean Plasma Glucose: 97 mg/dL
eAG (mmol/L): 5.4 mmol/L

## 2024-04-06 LAB — VITAMIN B12: Vitamin B-12: 525 pg/mL (ref 200–1100)

## 2024-04-06 LAB — VITAMIN D 25 HYDROXY (VIT D DEFICIENCY, FRACTURES): Vit D, 25-Hydroxy: 28 ng/mL — ABNORMAL LOW (ref 30–100)

## 2024-05-28 ENCOUNTER — Telehealth: Payer: Self-pay | Admitting: Cardiovascular Disease

## 2024-05-28 NOTE — Telephone Encounter (Signed)
 Pt c/o medication issue:  1. Name of Medication: Hydrochlorothiazide  25 mg, Losartan  25 mg and Atenolol  12. Mg -morning in the evening she takes 50 mg of Losartan   2. How are you currently taking this medication (dosage and times per day)?   3. Are you having a reaction (difficulty breathing--STAT)?   4. What is your medication issue? Blood pressure this morning before taking her medicine 164/84 and after the medicine - she took her pressure  at 11:45  it was 164/85- she says her pressure has been going up since last month

## 2024-06-01 NOTE — Progress Notes (Signed)
 Cardiology Office Note    Date:  06/02/2024   ID:  Molly Faulkner, DOB Dec 15, 1941, MRN [id number]  PCP:  Carollynn Cirri, NP  Cardiologist:  Belva Boyden, MD  Electrophysiologist:  None   Chief Complaint: Follow up  History of Present Illness:   Molly Faulkner is a 83 y.o. female with history of hypertension, hyperlipidemia, syncope, tachycardia, and OSA on CPAP who presents for follow up on hypertension.     Previous cardiac work-up in 2011 in [place]  with echo and stress test.  Her episode of syncope was February 2016 while on a plane to [place] .  Had not had much to drink or eat, queasy feeling in her stomach, got up developed lightheadedness, dizziness, is acute syncope and without first just a few seconds.  She had a normal echocardiogram 06/2017.  Calcium  score 04/2021 done for risk stratification showed normal coronary calcium  score of 0.   Patient was most recently seen by Dr. Gollan 06/12/2023 and overall doing well from a cardiac perspective.  She reported taking half doses of some of her medications.  Blood pressure was elevated with low heart rate and atenolol  was titrated down to 25 mg daily and losartan  was increased to 50 mg twice daily.  Patient is seen in clinic doing well from a cardiac perspective without symptoms of angina and cardiac decompensation. She has questions regarding her cholesterol and need for statin and aspirin . She reports that her PCP recommended she start these due to a finding on her coronary CTA. She also notes that he blood pressure has been running higher than usual with systolic pressures ranging from 130-160 mmHg. She reports that she has been under increased stress recently due to recurrent issues with lice/bed bugs. She has been referred to dermatology without know reason for recurrence. She has not yet seen her PCP for the issue but has been using permethrin  cream previously prescribed to her. Due to fear of her CPAP being infested with bugs, she has  not been using it at night leading to poor sleep. Patient also reports taking losartan  differently than prescribed. She has been taking 25 mg in the morning and 50 mg in the evening. She denies chest pain, shortness of breath, lightheadedness, dizziness, and lower extremity swelling. She denies bleeding and hematochezia.   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   albuterol  (VENTOLIN  HFA) 108 (90 Base) MCG/ACT inhaler Inhale 2 puffs into the lungs every 4 (four) hours as needed for wheezing or shortness of  breath.   aspirin  EC 81 MG tablet Take 1 tablet (81 mg total) by mouth daily. Swallow whole.   atenolol  (TENORMIN ) 25 MG tablet TAKE 1 TABLET EVERY DAY   CREAM BASE EX Apply 1-2 application topically 4 (four) times daily as needed (for arthritis pain.). Penetrex Inflammation Formula (Arnica/Pyridoxine/MSM/Boswellia/Cetyl Myristoleate)   cyclobenzaprine  (FLEXERIL ) 5 MG tablet TAKE 1/2 TABLET(2.5 MG) BY MOUTH AT BEDTIME   hydrochlorothiazide  (HYDRODIURIL ) 25 MG tablet Take 1 tablet (25 mg total) by mouth daily. TAKE 1 TABLET EVERY DAY   HYDROcodone -acetaminophen  (NORCO/VICODIN) 5-325 MG tablet Take 1 tablet by mouth  every 8 (eight) hours as needed for moderate pain.   hydrOXYzine  (ATARAX ) 10 MG tablet Take 1 tablet (10 mg total) by mouth at bedtime.   ibuprofen (ADVIL) 400 MG tablet Take 400 mg by mouth as needed for moderate pain (pain score 4-6).   levocetirizine (XYZAL ) 5 MG tablet TAKE 1 TABLET(5 MG) BY MOUTH EVERY EVENING   Lidocaine  4 % PTCH Apply 1 patch topically daily as needed (pain).   Multiple Vitamins-Minerals (EYE MULTIVITAMIN PO) Take by mouth. Ultimate Eye   permethrin  (ELIMITE ) 5 % cream Apply 1 Application topically once.   Suvorexant  (BELSOMRA ) 5 MG TABS Take 1 tablet (5 mg total) by mouth at bedtime as needed.   [DISCONTINUED] losartan  (COZAAR ) 50 MG tablet Take 1 tablet (50 mg total) by mouth daily.    Allergies:   Avelox  [moxifloxacin  hcl in nacl], Moxifloxacin , Trazodone  and nefazodone, and Clarithromycin   Social History   Socioeconomic History   Marital status: Single    Spouse name: Not on file   Number of children: Not on file   Years of education: Not on file   Highest education level: Not on file  Occupational History   Not on file  Tobacco Use   Smoking status: Former    Current packs/day: 0.50    Average packs/day: 0.5 packs/day for 7.0 years (3.5 ttl pk-yrs)    Types: Cigarettes   Smokeless tobacco: Never   Tobacco comments:    quit over 40 years ago  Vaping Use   Vaping status: Never Used  Substance and Sexual Activity   Alcohol use: Yes    Alcohol/week: 4.0 standard drinks of alcohol    Types: 4 Glasses of wine per week   Drug use: No   Sexual activity: Not Currently  Other Topics Concern   Not on file  Social History Narrative   Not on file   Social Drivers of Health   Financial Resource Strain: Low Risk  (08/28/2023)   Overall Financial Resource Strain (CARDIA)    Difficulty of Paying Living Expenses: Not very hard  Food Insecurity: No Food Insecurity (08/28/2023)   Hunger Vital Sign    Worried About Running Out of Food in the Last Year: Never  true    Ran Out of Food in the Last Year: Never true  Transportation Needs: No Transportation Needs (08/28/2023)   PRAPARE - Administrator, Civil Service (Medical): No    Lack of Transportation (Non-Medical): No  Physical Activity: Insufficiently Active (08/28/2023)   Exercise Vital Sign    Days of Exercise per Week: 2 days    Minutes of Exercise per Session: 20 min  Stress: No Stress Concern Present (08/28/2023)   [employer] of Occupational Health - Occupational Stress Questionnaire    Feeling of Stress : Only a little  Social Connections: Moderately Isolated (08/28/2023)   Social Connection and Isolation Panel  Frequency of Communication with Friends and Family: More than three times a week    Frequency of Social Gatherings with Friends and Family: Never    Attends Religious Services: Never    Database administrator or Organizations: Yes    Attends Engineer, structural: More than 4 times per year    Marital Status: Divorced     Family History:  The patient's family history includes Arthritis in her maternal grandmother; Breast cancer (age of onset: 33) in her mother; Diabetes in her maternal grandmother.  ROS:   12-point review of systems is negative unless otherwise noted in the HPI.   EKGs/Other Studies Reviewed:    EKG:  EKG personally reviewed by me today EKG Interpretation Date/Time:  Wednesday June 02 2024 11:08:57 EDT Ventricular Rate:  112 PR Interval:  206 QRS Duration:  76 QT Interval:  302 QTC Calculation: 412 R Axis:   -18  Text Interpretation: Sinus tachycardia Minimal voltage criteria for LVH, may be normal variant ( R in aVL ) When compared with ECG of 12-Jun-2023 11:37, Vent. rate has increased BY  61 BPM Confirmed by Gildardo Labrador ([id number]) on 06/02/2024 11:12:48 AM  PHYSICAL EXAM:    VS:  BP (!) 146/80   Pulse (!) 112   Ht 5' (1.524 m)   Wt 168 lb 12.8 oz (76.6 kg)   SpO2 98%   BMI 32.97 kg/m   BMI: Body mass index is 32.97  kg/m.  Physical Exam Vitals and nursing note reviewed.  Constitutional:      General: She is not in acute distress.    Appearance: Normal appearance.   Cardiovascular:     Rate and Rhythm: Normal rate and regular rhythm.     Heart sounds: No murmur heard. Pulmonary:     Effort: Pulmonary effort is normal. No respiratory distress.     Breath sounds: No wheezing or rales.   Musculoskeletal:     Right lower leg: No edema.     Left lower leg: No edema.   Skin:    General: Skin is warm and dry.   Neurological:     General: No focal deficit present.     Mental Status: She is alert and oriented to person, place, and time. Mental status is at baseline.   Psychiatric:        Mood and Affect: Mood normal.        Behavior: Behavior normal.     Wt Readings from Last 3 Encounters:  06/02/24 168 lb 12.8 oz (76.6 kg)  04/05/24 173 lb (78.5 kg)  02/12/24 172 lb 3.2 oz (78.1 kg)         ASSESSMENT & PLAN:   Hypertension - Blood pressure is mildly elevated in office and per at home readings. I suspect this is secondary to recent stress and noncompliance with CPAP. Recommend increasing losartan  to 50 mg twice daily. Continue to check BP at home one hour after taking medications and keep log. Start using CPAP again. Continue hydrochlorothiazide  25 mg daily and atenolol  25 mg daily.   Hyperlipidemia - Most recent lipid panel with LDL 101. Goal < 100. Patient has been working on lifestyle modifications and would like to recheck lipid panel today. No indication for statin therapy at this time.   OSA - Patient has not been compliant with CPAP recently. Encouraged compliance.   Aortic atherosclerosis - Moderate calcifications noted on CT chest. Shared decision to defer addition of ASA at this time given advanced age  and increased risk for bleeding.     Disposition: F/u with Dr. Gollan or an APP in 2 months.   Medication Adjustments/Labs and Tests Ordered: Current medicines are  reviewed at length with the patient today.  Concerns regarding medicines are outlined above. Medication changes, Labs and Tests ordered today are summarized above and listed in the Patient Instructions accessible in Encounters.   Beather Liming, PA-C 06/02/2024 1:09 PM     [facility] - [address] [place], [place] [address] [phone]

## 2024-06-02 ENCOUNTER — Encounter: Payer: Self-pay | Admitting: Cardiology

## 2024-06-02 ENCOUNTER — Ambulatory Visit: Attending: Cardiology | Admitting: Physician Assistant

## 2024-06-02 VITALS — BP 146/80 | HR 112 | Ht 60.0 in | Wt 168.8 lb

## 2024-06-02 DIAGNOSIS — I700 Atherosclerosis of aorta: Secondary | ICD-10-CM

## 2024-06-02 DIAGNOSIS — E785 Hyperlipidemia, unspecified: Secondary | ICD-10-CM

## 2024-06-02 DIAGNOSIS — Z79899 Other long term (current) drug therapy: Secondary | ICD-10-CM

## 2024-06-02 DIAGNOSIS — G4733 Obstructive sleep apnea (adult) (pediatric): Secondary | ICD-10-CM

## 2024-06-02 DIAGNOSIS — I10 Essential (primary) hypertension: Secondary | ICD-10-CM | POA: Diagnosis not present

## 2024-06-02 MED ORDER — LOSARTAN POTASSIUM 50 MG PO TABS: 50.0000 mg | ORAL_TABLET | Freq: Two times a day (BID) | ORAL | 3 refills | Status: DC

## 2024-06-02 NOTE — Patient Instructions (Signed)
 Medication Instructions:  Your physician recommends the following medication changes.  INCREASE: Losartan  to 50 mg twice daily  *If you need a refill on your cardiac medications before your next appointment, please call your pharmacy*  Lab Work: Your provider would like for you to have following labs drawn today Vit D, Lipid.   If you have labs (blood work) drawn today and your tests are completely normal, you will receive your results only by: MyChart Message (if you have MyChart) OR A paper copy in the mail If you have any lab test that is abnormal or we need to change your treatment, we will call you to review the results.  Testing/Procedures: No test ordered today   Follow-Up: At [facility], you and your health needs are our priority.  As part of our continuing mission to provide you with exceptional heart care, our providers are all part of one team.  This team includes your primary Cardiologist (physician) and Advanced Practice Providers or APPs (Physician Assistants and Nurse Practitioners) who all work together to provide you with the care you need, when you need it.  Your next appointment:   2 month(s)  Provider:   Timothy Gollan, MD or Gildardo Labrador, PA-C

## 2024-06-03 ENCOUNTER — Ambulatory Visit: Payer: Self-pay | Admitting: Physician Assistant

## 2024-06-03 LAB — LIPID PANEL
Chol/HDL Ratio: 3.1 ratio (ref 0.0–4.4)
Cholesterol, Total: 191 mg/dL (ref 100–199)
HDL: 61 mg/dL (ref >39)
LDL Chol Calc (NIH): 115 mg/dL — ABNORMAL HIGH (ref 0–99)
Triglycerides: 83 mg/dL (ref 0–149)
VLDL Cholesterol Cal: 15 mg/dL (ref 5–40)

## 2024-06-03 LAB — VITAMIN D 25 HYDROXY (VIT D DEFICIENCY, FRACTURES): Vit D, 25-Hydroxy: 25.1 ng/mL — ABNORMAL LOW (ref 30.0–100.0)

## 2024-06-04 MED ORDER — ATORVASTATIN CALCIUM 20 MG PO TABS: 20.0000 mg | ORAL_TABLET | Freq: Every day | ORAL | 0 refills | Status: DC

## 2024-06-04 NOTE — Telephone Encounter (Signed)
 Pt returning call to a nurse for Lab results

## 2024-06-08 ENCOUNTER — Encounter: Payer: Self-pay | Admitting: Internal Medicine

## 2024-06-08 ENCOUNTER — Ambulatory Visit: Admitting: Internal Medicine

## 2024-06-08 VITALS — BP 124/62 | HR 64 | Ht 60.0 in | Wt 170.4 lb

## 2024-06-08 DIAGNOSIS — E782 Mixed hyperlipidemia: Secondary | ICD-10-CM

## 2024-06-08 DIAGNOSIS — W57XXXA Bitten or stung by nonvenomous insect and other nonvenomous arthropods, initial encounter: Secondary | ICD-10-CM

## 2024-06-08 DIAGNOSIS — Z8711 Personal history of peptic ulcer disease: Secondary | ICD-10-CM | POA: Diagnosis not present

## 2024-06-08 DIAGNOSIS — I10 Essential (primary) hypertension: Secondary | ICD-10-CM

## 2024-06-08 DIAGNOSIS — I700 Atherosclerosis of aorta: Secondary | ICD-10-CM | POA: Diagnosis not present

## 2024-06-08 DIAGNOSIS — M479 Spondylosis, unspecified: Secondary | ICD-10-CM

## 2024-06-08 DIAGNOSIS — E559 Vitamin D deficiency, unspecified: Secondary | ICD-10-CM | POA: Diagnosis not present

## 2024-06-08 DIAGNOSIS — L989 Disorder of the skin and subcutaneous tissue, unspecified: Secondary | ICD-10-CM

## 2024-06-08 MED ORDER — PERMETHRIN 5 % EX CREA: 1.0000 | TOPICAL_CREAM | Freq: Once | CUTANEOUS | 1 refills | Status: AC

## 2024-06-08 MED ORDER — VITAMIN D (ERGOCALCIFEROL) 1.25 MG (50000 UNIT) PO CAPS
50000.0000 [IU] | ORAL_CAPSULE | ORAL | 0 refills | Status: DC
Start: 2024-06-08 — End: 2024-10-07

## 2024-06-08 MED ORDER — HYDROXYZINE HCL 10 MG PO TABS: 10.0000 mg | ORAL_TABLET | Freq: Every day | ORAL | 0 refills | Status: AC

## 2024-06-08 NOTE — Patient Instructions (Signed)
Vitamin D Deficiency Vitamin D deficiency is when your body does not have enough vitamin D. Vitamin D is important because: It helps your body use certain minerals. It helps to keep your bones healthy. It lessens irritation and swelling (inflammation). It helps the body's defense system (immune system) work better. Not getting enough vitamin D can make your bones soft. What are the causes? Not eating enough foods that have vitamin D in them. Not getting enough sun. Having diseases that make it hard for your body to take in vitamin D. Having had part of your stomach or part of your small intestine taken out. What increases the risk? Being an older adult. Not spending much time outdoors. Living in a long-term care center. Having dark skin. Taking certain medicines. Being overweight or very overweight (obese). Having long-term (chronic) kidney or liver disease. What are the signs or symptoms? In mild cases, there may be no symptoms. If the condition is very bad, symptoms may include: Bone pain. Muscle pain. Not being able to walk normally. Bones that break easily. Joint pain. How is this treated? Treatment may include taking supplements as told by your doctor. Your doctor will tell you what dose is best for you. This may include taking: Vitamin D. Calcium. Follow these instructions at home: Eating and drinking Eat foods that have vitamin D in them, such as: Dairy products, cereals, or juices that have vitamin D added to them (are fortified). Check the label. Fish, such as salmon or trout. Eggs. The vitamin D is in the yolk. Mushrooms that were treated with UV light. Beef liver. The items listed above may not be a complete list of foods and beverages you can eat and drink. Contact a dietitian for more information. General instructions Take over-the-counter and prescription medicines only as told by your doctor. Take supplements only as told by your doctor. Get sunlight in a  safe way. Do not use a tanning bed. Stay at a healthy weight. Lose weight if you need to. Keep all follow-up visits. How is this prevented? Eating foods that naturally have vitamin D in them. Eating or drinking foods and drinks that have vitamin D added to them, such as cereals, juices, and milk. Taking vitamin D or a multivitamin that has vitamin D in it. Being in the sun. Your body makes vitamin D when your skin gets sunlight. Contact a doctor if: Your symptoms do not go away. You feel like you may vomit (nauseous). You vomit. You poop less often than normal, or you have trouble pooping (constipation). Summary Vitamin D deficiency is when your body does not have enough vitamin D. Vitamin D helps to keep your bones healthy. This condition is often treated by taking a supplement. Your doctor will tell you what dose is best for you. This information is not intended to replace advice given to you by your health care provider. Make sure you discuss any questions you have with your health care provider. Document Revised: 09/07/2021 Document Reviewed: 09/07/2021 Elsevier Patient Education  2024 [employer].

## 2024-06-08 NOTE — Progress Notes (Signed)
 Subjective:    Patient ID: Molly Faulkner, female    DOB: 06-25-1941, 83 y.o.   MRN: [id number]  HPI  Discussed the use of AI scribe software for clinical note transcription with the patient, who gave verbal consent to proceed.  Molly Faulkner is an 83 year old female with vitamin D  deficiency and coronary artery disease who presents for follow-up on her vitamin D  levels and medication management.  Her vitamin D  levels remain low despite supplementation, with a recent level of 25.1 ng/mL on June 02, 2024, down from 28 ng/mL on April 05, 2024. She has been taking vitamin D3 at a dose of 1000 units every other day.  She recently started atorvastatin  20 mg daily for elevated LDL cholesterol, which was 118 mg/dL on June 02, 2024. Her cardiologist recommended her LDL should be below 70 mg/dL due to plaque buildup in her heart. She started the medication a few days ago and is monitoring for potential interactions with her other medications, including an eye vitamin.  She takes ibuprofen occasionally for back pain to aid in sleep, approximately every second or third night. She has a history of stomach pain suggestive of ulcers and is cautious about the use of ibuprofen in conjunction with aspirin , which she is resistant to taking despite its recommendation for her aortic atherosclerosis.  She requests renewals for atarax  and permethrin  cream, citing a persistent issue with what she believes to be body lice. She describes seeing and killing a bug in her sink and has been managing the condition with advice from an online group.  She has seen dermatology for the same but reports that they have not been able to give her a definitive diagnosis.  Her blood pressure is well-controlled on losartan , which was recently increased to 50 mg twice daily.  She is taking atenolol  as well.  She notes that her blood pressure readings have been good since the adjustment.  She mentions the development of skin lesions,  which she describes as warts, particularly on her legs. She has noticed changes in her skin as she ages and expresses concern about these growths.       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     albuterol  (VENTOLIN  HFA) 108 (90 Base) MCG/ACT inhaler Inhale 2 puffs into the lungs every 4 (four) hours as needed for wheezing or shortness of breath. 8 g 8   aspirin  EC 81 MG tablet Take 1 tablet (81 mg total) by mouth daily. Swallow whole.     atenolol  (TENORMIN ) 25 MG tablet TAKE 1 TABLET EVERY DAY 90 tablet 3   atorvastatin  (LIPITOR) 20 MG tablet  Take 1 tablet (20 mg total) by mouth daily. 90 tablet 0   CREAM BASE EX Apply 1-2 application topically 4 (four) times daily as needed (for arthritis pain.). Penetrex Inflammation Formula (Arnica/Pyridoxine/MSM/Boswellia/Cetyl Myristoleate)     cyclobenzaprine  (FLEXERIL ) 5 MG tablet TAKE 1/2 TABLET(2.5 MG) BY MOUTH AT BEDTIME 30 tablet 0   hydrochlorothiazide  (HYDRODIURIL ) 25 MG tablet Take 1 tablet (25 mg total) by mouth daily. TAKE 1 TABLET EVERY DAY 90 tablet 3   HYDROcodone -acetaminophen  (NORCO/VICODIN) 5-325 MG tablet Take 1 tablet by mouth every 8 (eight) hours as needed for moderate pain. 15 tablet 0   hydrOXYzine  (ATARAX ) 10 MG tablet Take 1 tablet (10 mg total) by mouth at bedtime. 90 tablet 0   ibuprofen  (ADVIL) 400 MG tablet Take 400 mg by mouth as needed for moderate pain (pain score 4-6).     levocetirizine (XYZAL ) 5 MG tablet TAKE 1 TABLET(5 MG) BY MOUTH EVERY EVENING 90 tablet 1   Lidocaine  4 % PTCH Apply 1 patch topically daily as needed (pain).     losartan  (COZAAR ) 50 MG tablet Take 1 tablet (50 mg total) by mouth 2 (two) times daily. 90 tablet 3   Multiple Vitamins-Minerals (EYE MULTIVITAMIN PO) Take by mouth. [employer]     permethrin  (ELIMITE ) 5 % cream Apply 1 Application topically once.     Suvorexant  (BELSOMRA ) 5 MG TABS Take 1 tablet (5 mg total) by mouth at bedtime as needed. 30 tablet 0   No current facility-administered medications for this visit.    Allergies  Allergen Reactions   Avelox  [Moxifloxacin  Hcl In Nacl] Other (See Comments)    Chills   Moxifloxacin      Other Reaction(s): Not available  moxifloxacin    Trazodone  And Nefazodone Swelling   Clarithromycin Other (See Comments)    Unsure of reaction type  Other Reaction(s): Not available  clarithromycin    Family History  Problem Relation Age of Onset   Breast cancer Mother 29   Arthritis Maternal Grandmother    Diabetes Maternal Grandmother     Social History   Socioeconomic History   Marital status: Single    Spouse name: Not on file   Number of children: Not on file   Years of education: Not on file   Highest education level: Not on file  Occupational History   Not on file  Tobacco Use   Smoking status: Former    Current packs/day: 0.50    Average packs/day: 0.5 packs/day for 7.0 years (3.5 ttl pk-yrs)    Types: Cigarettes   Smokeless tobacco: Never   Tobacco comments:    quit over 40 years ago  Vaping Use   Vaping status: Never Used  Substance and Sexual Activity   Alcohol use: Yes    Alcohol/week: 4.0 standard drinks of alcohol    Types: 4 Glasses of wine per week   Drug use: No   Sexual activity: Not Currently  Other Topics Concern   Not on file  Social History Narrative    Not on file   Social Drivers of Health   Financial Resource Strain: Low Risk  (08/28/2023)   Overall Financial Resource Strain (CARDIA)    Difficulty of Paying Living Expenses: Not very hard  Food Insecurity: No Food Insecurity (08/28/2023)   Hunger Vital Sign    Worried About Running Out of Food in the Last Year: Never true    Ran Out of Food in the Last Year: Never true  Transportation Needs: No Transportation Needs (  08/28/2023)   PRAPARE - Administrator, Civil Service (Medical): No    Lack of Transportation (Non-Medical): No  Physical Activity: Insufficiently Active (08/28/2023)   Exercise Vital Sign    Days of Exercise per Week: 2 days    Minutes of Exercise per Session: 20 min  Stress: No Stress Concern Present (08/28/2023)   [employer] of Occupational Health - Occupational Stress Questionnaire    Feeling of Stress : Only a little  Social Connections: Moderately Isolated (08/28/2023)   Social Connection and Isolation Panel    Frequency of Communication with Friends and Family: More than three times a week    Frequency of Social Gatherings with Friends and Family: Never    Attends Religious Services: Never    Database administrator or Organizations: Yes    Attends Engineer, structural: More than 4 times per year    Marital Status: Divorced  Intimate Partner Violence: Not At Risk (08/28/2023)   Humiliation, Afraid, Rape, and Kick questionnaire    Fear of Current or Ex-Partner: No    Emotionally Abused: No    Physically Abused: No    Sexually Abused: No     Constitutional: Denies fever, malaise, fatigue, headache or abrupt weight changes.  HEENT: Denies eye pain, eye redness, ear pain, ringing in the ears, wax buildup, runny nose, nasal congestion, bloody nose, or sore throat. Respiratory: Denies difficulty breathing, shortness of breath, cough or sputum production.   Cardiovascular: Denies chest pain, chest tightness, palpitations or swelling in the  hands or feet.  Gastrointestinal: Denies abdominal pain, bloating, constipation, diarrhea or blood in the stool.  GU: Denies urgency, frequency, pain with urination, burning sensation, blood in urine, odor or discharge. Musculoskeletal: Denies decrease in range of motion, difficulty with gait, muscle pain or joint pain and swelling.  Skin: Patient reports multiple insect bites, skin lesion on left leg denies ulcercations.  Neurological: Patient reports insomnia, intermittent paresthesias of lower extremities.  Denies dizziness, difficulty with memory, difficulty with speech or problems with balance and coordination.  Psych: Patient has a history of depression.  Denies anxiety, SI/HI.  No other specific complaints in a complete review of systems (except as listed in HPI above).   Objective:   Physical Exam BP 124/62 (BP Location: Left Arm, Patient Position: Sitting, Cuff Size: Normal)   Pulse 64   Ht 5' (1.524 m)   Wt 170 lb 6.4 oz (77.3 kg)   SpO2 99%   BMI 33.28 kg/m     Wt Readings from Last 3 Encounters:  06/02/24 168 lb 12.8 oz (76.6 kg)  04/05/24 173 lb (78.5 kg)  02/12/24 172 lb 3.2 oz (78.1 kg)    General: Appears her stated age, obese, in NAD. Skin: Pinpoint pustule noted on left forearm.  2 mm raised, scaly lesion noted of left lateral thigh. HEENT: Head: normal shape and size; Eyes: sclera white, no icterus, conjunctiva pink, PERRLA and EOMs intact;  Cardiovascular: Bradycardic with normal rhythm. S1,S2 noted.  No murmur, rubs or gallops noted. Pulmonary/Chest: Normal effort and positive vesicular breath sounds. No respiratory distress. No wheezes, rales or ronchi noted.  Abdomen: Normal bowel sounds.   Neurological: Alert and oriented. Coordination normal.  Psychiatric: Mood and affect normal. Behavior is normal. Judgment and thought content normal.     BMET    Component Value Date/Time   NA 140 04/05/2024 1439   NA 140 01/10/2020 0950   K 4.1 04/05/2024 1439    CL 105  04/05/2024 1439   CO2 28 04/05/2024 1439   GLUCOSE 94 04/05/2024 1439   BUN 22 04/05/2024 1439   BUN 15 01/10/2020 0950   CREATININE 0.94 04/05/2024 1439   CALCIUM  10.4 04/05/2024 1439   GFRNONAA 64 01/10/2020 0950   GFRAA 74 01/10/2020 0950    Lipid Panel     Component Value Date/Time   CHOL 191 06/02/2024 1158   TRIG 83 06/02/2024 1158   HDL 61 06/02/2024 1158   CHOLHDL 3.1 06/02/2024 1158   CHOLHDL 3.0 04/05/2024 1439   VLDL 9 03/22/2020 1139   LDLCALC 115 (H) 06/02/2024 1158   LDLCALC 103 (H) 04/05/2024 1439    CBC    Component Value Date/Time   WBC 5.9 04/05/2024 1439   RBC 3.82 04/05/2024 1439   HGB 12.6 04/05/2024 1439   HGB 13.9 01/10/2020 0950   HCT 36.1 04/05/2024 1439   HCT 40.1 01/10/2020 0950   PLT 191 04/05/2024 1439   PLT 227 01/10/2020 0950   MCV 94.5 04/05/2024 1439   MCV 97 01/10/2020 0950   MCH 33.0 04/05/2024 1439   MCHC 34.9 04/05/2024 1439   RDW 12.1 04/05/2024 1439   RDW 12.3 01/10/2020 0950   LYMPHSABS 1.0 01/10/2020 0950   EOSABS 0.3 01/10/2020 0950   BASOSABS 0.0 01/10/2020 0950    Hgb A1C Lab Results  Component Value Date   HGBA1C 5.0 04/05/2024            Assessment & Plan:   Assessment and Plan    Multiple insect bites 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  - Prescribe permethrin  5% cream. - Prescribe hydroxyzine  (Atarax ). - She will continue to follow with dermatology  Hyperlipidemia with atherosclerosis Atherosclerosis with plaque buildup. LDL 118 mg/dL, triglycerides 83 mg/dL. Goal LDL <70 mg/dL. - Continue atorvastatin  20 mg daily. - Recheck lipid panel in 4 months.  Aortic atherosclerosis Aortic atherosclerosis present. Advised low-dose aspirin  to prevent cardiovascular events. Discussed ibuprofen interaction and bleeding risk. - Take 81 mg enteric-coated aspirin  daily. -Continue atorvastatin  20 mg daily - Avoid daily use of ibuprofen.  Hypertension Blood pressure well-controlled with  losartan  50 mg twice daily. - Continue losartan  50 mg twice daily. - Continue atenolol  25 mg daily  Vitamin D  deficiency Vitamin D  level decreased. Current intake insufficient. Discussed increasing dose or starting ergocalciferol . Opted for prescription. - Prescribe ergocalciferol  50,000 IU weekly for 12 weeks. - After 12 weeks, take vitamin D3 2000 IU daily. - Recheck vitamin D  levels at next visit.  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 No treatment necessary unless for cosmetic reasons. - Consider referral to dermatologist for removal if desired.         RTC in 4 months for your annual exam Angeline Laura, NP

## 2024-07-01 ENCOUNTER — Encounter: Payer: Self-pay | Admitting: Internal Medicine

## 2024-07-28 ENCOUNTER — Other Ambulatory Visit: Payer: Self-pay | Admitting: Internal Medicine

## 2024-07-30 NOTE — Telephone Encounter (Signed)
 Requested medication (s) are due for refill today: yes  Requested medication (s) are on the active medication list: yes  Last refill:  02/03/24  Future visit scheduled: {Yes  Notes to clinic:  Unable to refill per protocol, cannot delegate.      Requested Prescriptions  Pending Prescriptions Disp Refills   cyclobenzaprine  (FLEXERIL ) 5 MG tablet [Pharmacy Med Name: CYCLOBENZAPRINE  5MG  TABLETS] 30 tablet 0    Sig: TAKE 1/2 TABLET(2.5 MG) BY MOUTH AT BEDTIME     Not Delegated - Analgesics:  Muscle Relaxants Failed - 07/30/2024  2:57 PM      Failed - This refill cannot be delegated      Passed - Valid encounter within last 6 months    Recent Outpatient Visits           1 month ago Vitamin D  deficiency   [facility] [place], Angeline ORN, NP   3 months ago Other mononeuropathy   [place] [facility] [place], Angeline ORN, NP       Future Appointments             In 3 days Gollan, Timothy J, MD [facility]

## 2024-07-31 NOTE — Progress Notes (Unsigned)
 Cardiology Office Note  Date:  08/02/2024   ID:  JONEL SICK, DOB Jul 20, 1941, MRN [id number]  PCP:  Antonette Angeline ORN, NP   Chief Complaint  Patient presents with   2 month follow up     Patient has been off atorvastatin  since July 2025 due to muscle aches/dark urine/leg weakness. Doing well.     HPI:  Ms. Macy is a 83 year old woman with history of  hypertension,  syncope,  tachycardia  OSA on CPAP Cardiac workup in 2011 at [place] , echo and stress test CT coronary calcium  scoring of 0 in May 2022 who presents for follow-up of her hypertension and tachycardia  Last seen by myself 6/24 Seen by one of our providers June 2025  Active driving to [place] later this week to see her friend Started exercise program Walking more Denies significant chest pain or shortness of breath  LDL 115, was instructed to start Lipitor 20 daily Reports that she had muscle ache, dark urine, stopped the medication Interested in nonstatin options  Overall feeling well, blood pressure well-controlled  EKG personally reviewed by myself on todays visit EKG Interpretation Date/Time:  Monday August 02 2024 11:56:37 EDT Ventricular Rate:  58 PR Interval:  236 QRS Duration:  84 QT Interval:  380 QTC Calculation: 373 R Axis:   -5  Text Interpretation: Sinus bradycardia with 1st degree A-V block When compared with ECG of 02-Jun-2024 11:08, PR interval has increased Vent. rate has decreased BY  54 BPM Confirmed by Perla Lye [phone]) on 08/02/2024 12:16:58 PM   PMH:   has a past medical history of Allergy, Arthritis, Asthma, Carpal tunnel syndrome (12/2017), Cataract of left eye (12/2017), Chicken pox, Depression, Diverticulitis, Diverticulosis, Dysrhythmia, GERD (gastroesophageal reflux disease), colonic polyps, Hyperlipidemia, Hypertension, Insomnia, 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  PSH:    Past Surgical History:  Procedure Laterality Date   ABDOMINAL HYSTERECTOMY  1994    total   BREAST BIOPSY Bilateral 8018,8016   BREAST EXCISIONAL BIOPSY     CARPAL TUNNEL RELEASE Bilateral    CATARACT EXTRACTION W/PHACO Right 12/02/2017   Procedure: CATARACT EXTRACTION PHACO AND INTRAOCULAR LENS PLACEMENT (IOC);  Surgeon: Jaye Fallow, MD;  Location: [facility] ORS;  Service: Ophthalmology;  Laterality: Right;  US  00:36 AP% 11.5 CDE 4.21 Fluid pack lot # [id number]   CATARACT EXTRACTION W/PHACO Left 12/30/2017   Procedure: CATARACT EXTRACTION PHACO AND INTRAOCULAR LENS PLACEMENT (IOC);  Surgeon: Jaye Fallow, MD;  Location: [facility] ORS;  Service: Ophthalmology;  Laterality: Left;  US  00:58 AP% 11.6 CDE 6.80 Fluid pack lot # [id number]   COLONOSCOPY WITH PROPOFOL  N/A 06/30/2018   Procedure: COLONOSCOPY WITH PROPOFOL ;  Surgeon: Therisa Bi, MD;  Location: [facility] ENDOSCOPY;  Service: Gastroenterology;  Laterality: N/A;   COLONOSCOPY WITH PROPOFOL  N/A 01/07/2022   Procedure: COLONOSCOPY WITH PROPOFOL ;  Surgeon: Unk Corinn Skiff, MD;  Location: [facility] ENDOSCOPY;  Service: Gastroenterology;  Laterality: N/A;   DILATION AND CURETTAGE OF UTERUS     X 2   KNEE ARTHROSCOPY      Current Outpatient Medications  Medication Sig Dispense Refill   acetaminophen  (TYLENOL ) 500 MG tablet Take 500 mg by mouth every 8 (eight) hours as needed for moderate pain.     albuterol  (VENTOLIN  HFA) 108 (90 Base) MCG/ACT inhaler Inhale 2 puffs into the lungs every 4 (four) hours as needed for wheezing or shortness of breath. 8 g 8   aspirin  EC 81 MG tablet Take 1 tablet (81 mg total) by mouth daily. Swallow  whole.     atenolol  (TENORMIN ) 25 MG tablet TAKE 1 TABLET EVERY DAY 90 tablet 3   CREAM BASE EX Apply 1-2 application topically 4 (four) times daily as needed (for arthritis pain.). Penetrex Inflammation Formula (Arnica/Pyridoxine/MSM/Boswellia/Cetyl Myristoleate)     cyclobenzaprine  (FLEXERIL ) 5 MG tablet TAKE 1/2 TABLET(2.5 MG) BY MOUTH AT BEDTIME 30 tablet 0   hydrochlorothiazide  (HYDRODIURIL ) 25 MG  tablet Take 1 tablet (25 mg total) by mouth daily. TAKE 1 TABLET EVERY DAY 90 tablet 3   HYDROcodone -acetaminophen  (NORCO/VICODIN) 5-325 MG tablet Take 1 tablet by mouth every 8 (eight) hours as needed for moderate pain. 15 tablet 0   hydrOXYzine  (ATARAX ) 10 MG tablet Take 1 tablet (10 mg total) by mouth at bedtime. 90 tablet 0   ibuprofen (ADVIL) 400 MG tablet Take 400 mg by mouth as needed for moderate pain (pain score 4-6).     levocetirizine (XYZAL ) 5 MG tablet TAKE 1 TABLET(5 MG) BY MOUTH EVERY EVENING 90 tablet 1   losartan  (COZAAR ) 50 MG tablet Take 1 tablet (50 mg total) by mouth 2 (two) times daily. 90 tablet 3   Multiple Vitamins-Minerals (EYE MULTIVITAMIN PO) Take by mouth. Ultimate Eye     Suvorexant  (BELSOMRA ) 5 MG TABS Take 1 tablet (5 mg total) by mouth at bedtime as needed. 30 tablet 0   Vitamin D , Ergocalciferol , (DRISDOL ) 1.25 MG (50000 UNIT) CAPS capsule Take 1 capsule (50,000 Units total) by mouth once a week. For 12 weeks. Then start OTC Vitamin D3 2,000 unit daily. 12 capsule 0   No current facility-administered medications for this visit.    Allergies:   Avelox  [moxifloxacin  hcl in nacl], Moxifloxacin , Trazodone  and nefazodone, and Clarithromycin   Social History:  The patient  reports that she has quit smoking. Her smoking use included cigarettes. She has a 3.5 pack-year smoking history. She has never used smokeless tobacco. She reports current alcohol use of about 4.0 standard drinks of alcohol per week. She reports that she does not use drugs.   Family History:   family history includes Arthritis in her maternal grandmother; Breast cancer (age of onset: 27) in her mother; Diabetes in her maternal grandmother.   Review of Systems: Review of Systems  Constitutional: Negative.   Respiratory: Negative.    Cardiovascular: Negative.   Gastrointestinal: Negative.   Musculoskeletal: Negative.   Neurological: Negative.   Psychiatric/Behavioral: Negative.    All other  systems reviewed and are negative.   PHYSICAL EXAM: VS:  BP 122/70 (BP Location: Left Arm, Patient Position: Sitting, Cuff Size: Normal)   Pulse (!) 58   Ht 5' (1.524 m)   Wt 168 lb 2 oz (76.3 kg)   SpO2 96%   BMI 32.83 kg/m  , BMI Body mass index is 32.83 kg/m. Constitutional:  oriented to person, place, and time. No distress.  HENT:  Head: Grossly normal Eyes:  no discharge. No scleral icterus.  Neck: No JVD, no carotid bruits  Cardiovascular: Regular rate and rhythm, no murmurs appreciated Pulmonary/Chest: Clear to auscultation bilaterally, no wheezes or rails Abdominal: Soft.  no distension.  no tenderness.  Musculoskeletal: Normal range of motion Neurological:  normal muscle tone. Coordination normal. No atrophy Skin: Skin warm and dry Psychiatric: normal affect, pleasant  Recent Labs: 04/05/2024: ALT 13; BUN 22; Creat 0.94; Hemoglobin 12.6; Platelets 191; Potassium 4.1; Sodium 140    Lipid Panel Lab Results  Component Value Date   CHOL 191 06/02/2024   HDL 61 06/02/2024   LDLCALC 115 (H)  06/02/2024   TRIG 83 06/02/2024    Wt Readings from Last 3 Encounters:  08/02/24 168 lb 2 oz (76.3 kg)  06/08/24 170 lb 6.4 oz (77.3 kg)  06/02/24 168 lb 12.8 oz (76.6 kg)     ASSESSMENT AND PLAN:  Essential hypertension  Blood pressure is well controlled on today's visit. No changes made to the medications.  Pure hypercholesterolemia CT scan images pulled up and reviewed Calcium  score of 0, Mild diffuse aortic atherosclerosis noted She is willing to try alternate cholesterol medication, will start Zetia  10 mg daily.  She would prefer a nonstatin option to start If numbers continue to run high potentially could try atorvastatin  10 mg 3 days a week with Zetia   OSA  On CPAP, stable  Obesity We have encouraged continued exercise, careful diet management in an effort to lose weight.   Orders Placed This Encounter  Procedures   EKG 12-Lead     Signed, Velinda Lunger,  M.D., Ph.D. 08/02/2024  [facility] [place], [place] [phone]

## 2024-08-02 ENCOUNTER — Encounter: Payer: Self-pay | Admitting: Cardiovascular Disease

## 2024-08-02 ENCOUNTER — Ambulatory Visit: Attending: Cardiovascular Disease | Admitting: Cardiovascular Disease

## 2024-08-02 VITALS — BP 122/70 | HR 58 | Ht 60.0 in | Wt 168.1 lb

## 2024-08-02 DIAGNOSIS — I700 Atherosclerosis of aorta: Secondary | ICD-10-CM

## 2024-08-02 DIAGNOSIS — G4733 Obstructive sleep apnea (adult) (pediatric): Secondary | ICD-10-CM

## 2024-08-02 DIAGNOSIS — I10 Essential (primary) hypertension: Secondary | ICD-10-CM

## 2024-08-02 DIAGNOSIS — E782 Mixed hyperlipidemia: Secondary | ICD-10-CM | POA: Diagnosis not present

## 2024-08-02 MED ORDER — HYDROCHLOROTHIAZIDE 25 MG PO TABS: 25.0000 mg | ORAL_TABLET | Freq: Every day | ORAL | 3 refills | Status: DC

## 2024-08-02 MED ORDER — EZETIMIBE 10 MG PO TABS: 10.0000 mg | ORAL_TABLET | Freq: Every day | ORAL | 3 refills | Status: DC

## 2024-08-02 NOTE — Patient Instructions (Addendum)
 Medication Instructions:   Start zetia  10 mg daily  If you need a refill on your cardiac medications before your next appointment, please call your pharmacy.   Lab work: No new labs needed  Testing/Procedures: No new testing needed  Follow-Up: At [facility], you and your health needs are our priority.  As part of our continuing mission to provide you with exceptional heart care, we have created designated Provider Care Teams.  These Care Teams include your primary Cardiologist (physician) and Advanced Practice Providers (APPs -  Physician Assistants and Nurse Practitioners) who all work together to provide you with the care you need, when you need it.  You will need a follow up appointment in 12 months  Providers on your designated Care Team:   Lonni Meager, NP Bernardino Bring, PA-C Cadence [place], [place]  COVID-19 Vaccine Information can be found at: [email or web address] For questions related to vaccine distribution or appointments, please email [email or web address] or call [phone].

## 2024-08-24 ENCOUNTER — Ambulatory Visit: Admitting: Diagnostic Neuroimaging

## 2024-08-24 ENCOUNTER — Encounter: Payer: Self-pay | Admitting: Diagnostic Neuroimaging

## 2024-08-24 VITALS — BP 152/78 | HR 57 | Ht 60.0 in | Wt 167.6 lb

## 2024-08-24 DIAGNOSIS — R200 Anesthesia of skin: Secondary | ICD-10-CM | POA: Diagnosis not present

## 2024-08-24 DIAGNOSIS — R269 Unspecified abnormalities of gait and mobility: Secondary | ICD-10-CM

## 2024-08-24 NOTE — Patient Instructions (Signed)
 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

## 2024-08-24 NOTE — Progress Notes (Signed)
 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 Pt reports for about 1 year has had pain and numbness across toes and bottom of both feet.     HISTORY OF PRESENT ILLNESS:   83 year old female here for evaluation of numbness and tingling.  For past 1 year patient has had numbness and tingling in the toes and bottom of feet.  Sometimes has a burning sensation.  No sensations in the fingers or hands.  Has had some low back pain in the past and had MRI in 2023 with mild degenerative changes.   REVIEW OF SYSTEMS: Full 14 system review of systems performed and negative with exception of: As per HPI.  ALLERGIES: Allergies  Allergen Reactions   Avelox  [Moxifloxacin  Hcl In Nacl] Other (See Comments)    Chills   Moxifloxacin      Other Reaction(s): Not available  moxifloxacin    Statins Other (See Comments)    Dark urine and leg weakness   Trazodone  And Nefazodone Swelling   Clarithromycin Other (See Comments)    Unsure of reaction type  Other Reaction(s): Not available  clarithromycin    HOME MEDICATIONS: Outpatient Medications Prior to Visit  Medication Sig Dispense Refill   acetaminophen  (TYLENOL ) 500 MG tablet Take 500 mg by mouth every 8 (eight) hours as needed for moderate pain.     albuterol  (VENTOLIN  HFA) 108 (90 Base) MCG/ACT inhaler Inhale 2 puffs into the lungs every 4 (four) hours as needed for wheezing or shortness of breath. 8 g 8   aspirin  EC 81 MG tablet Take 1 tablet (81 mg total) by mouth daily. Swallow whole.     atenolol  (TENORMIN ) 25 MG tablet TAKE 1 TABLET EVERY DAY 90 tablet 3   CREAM BASE EX Apply 1-2 application topically 4 (four) times daily as needed (for arthritis pain.). Penetrex  Inflammation Formula (Arnica/Pyridoxine/MSM/Boswellia/Cetyl Myristoleate)     cyclobenzaprine  (FLEXERIL ) 5 MG tablet TAKE 1/2 TABLET(2.5 MG) BY MOUTH AT BEDTIME 30 tablet 0   ezetimibe  (ZETIA ) 10 MG tablet Take 1 tablet (10 mg total) by mouth daily. (Patient taking differently: Take 5 mg by mouth every other day.) 90 tablet 3   hydrochlorothiazide  (HYDRODIURIL ) 25 MG tablet Take 1 tablet (25 mg total) by mouth daily. TAKE 1 TABLET EVERY DAY 90 tablet 3   HYDROcodone -acetaminophen  (NORCO/VICODIN) 5-325 MG tablet Take 1 tablet by mouth every 8 (eight) hours as needed for moderate pain. 15 tablet 0   hydrOXYzine  (ATARAX ) 10 MG tablet Take 1 tablet (10 mg total) by mouth at bedtime. 90 tablet 0   ibuprofen (ADVIL) 400 MG tablet Take 400 mg by mouth as needed for moderate pain (pain score 4-6).     levocetirizine (XYZAL ) 5 MG tablet TAKE 1 TABLET(5 MG) BY MOUTH EVERY EVENING 90 tablet 1   losartan  (COZAAR ) 50 MG tablet Take 1 tablet (50 mg total) by mouth 2 (two) times daily. 90 tablet 3   Multiple Vitamins-Minerals (EYE MULTIVITAMIN PO) Take by mouth. Ultimate Eye     Suvorexant  (BELSOMRA ) 5 MG TABS Take 1 tablet (5 mg total) by mouth at bedtime as needed. 30 tablet 0   Vitamin D , Ergocalciferol , (DRISDOL ) 1.25 MG (50000 UNIT) CAPS capsule Take 1 capsule (50,000 Units total)  by mouth once a week. For 12 weeks. Then start OTC Vitamin D3 2,000 unit daily. 12 capsule 0   No facility-administered medications prior to visit.    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   Sleep apnea    H/O    NO LONGER   Syncope     PAST SURGICAL HISTORY: Past Surgical History:  Procedure Laterality Date   ABDOMINAL HYSTERECTOMY  1994   total   BREAST BIOPSY Bilateral 8018,8016   BREAST EXCISIONAL BIOPSY     CARPAL TUNNEL RELEASE Bilateral    CATARACT EXTRACTION W/PHACO Right 12/02/2017   Procedure: CATARACT EXTRACTION PHACO AND INTRAOCULAR LENS PLACEMENT (IOC);  Surgeon: Jaye Fallow, MD;  Location: [facility] ORS;  Service: Ophthalmology;  Laterality: Right;  US  00:36 AP% 11.5 CDE 4.21 Fluid pack lot # [id number] H   CATARACT EXTRACTION W/PHACO Left 12/30/2017   Procedure: CATARACT EXTRACTION PHACO AND INTRAOCULAR LENS PLACEMENT (IOC);  Surgeon: Jaye Fallow, MD;  Location: [facility] ORS;  Service: Ophthalmology;  Laterality: Left;  US  00:58 AP% 11.6 CDE 6.80 Fluid pack lot # [id number] H   COLONOSCOPY WITH PROPOFOL  N/A 06/30/2018   Procedure: COLONOSCOPY WITH PROPOFOL ;  Surgeon: Therisa Bi, MD;  Location: [facility] END[facility]COPY;  Service: Gastroenterology;  Laterality: N/A;   COLONOSCOPY WITH PROPOFOL  N/A 01/07/2022   Procedure: COLONOSCOPY WITH PROPOFOL ;  Surgeon: Unk Corinn Skiff, MD;  Location: [facility] ENDOSCOPY;  Service: Gastroenterology;  Laterality: N/A;   DILATION AND CURETTAGE OF UTERUS     X 2   KNEE ARTHROSCOPY      FAMILY HISTORY: Family History  Problem Relation Age of Onset   Breast cancer Mother 31   Arthritis Maternal Grandmother    Diabetes Maternal Grandmother     SOCIAL HISTORY: Social History   Socioeconomic History   Marital status: Single    Spouse name: Not on file   Number of children: Not on file   Years of education: Not on file   Highest education level: Not on file  Occupational History   Not on file  Tobacco Use   Smoking status: Former    Current packs/day: 0.50    Average packs/day: 0.5 packs/day for 7.0 years (3.5 ttl pk-yrs)    Types: Cigarettes   Smokeless tobacco: Never   Tobacco comments:    quit over 40 years ago  Vaping Use   Vaping status:  Never Used  Substance and Sexual Activity   Alcohol use: Yes    Alcohol/week: 2.0 standard drinks of alcohol    Types: 2 Shots of liquor per week   Drug use: No   Sexual activity: Not Currently  Other Topics Concern   Not on file  Social History Narrative   Not on file   Social Drivers of Health   Financial Resource Strain: Low Risk  (08/28/2023)   Overall Financial Resource Strain (CARDIA)    Difficulty of Paying Living Expenses: Not very hard  Food Insecurity: No Food Insecurity (08/28/2023)  Hunger Vital Sign    Worried About Running Out of Food in the Last Year: Never true    Ran Out of Food in the Last Year: Never true  Transportation Needs: No Transportation Needs (08/28/2023)   PRAPARE - Administrator, Civil Service (Medical): No    Lack of Transportation (Non-Medical): No  Physical Activity: Insufficiently Active (08/28/2023)   Exercise Vital Sign    Days of Exercise per Week: 2 days    Minutes of Exercise per Session: 20 min  Stress: No Stress Concern Present (08/28/2023)   [employer] of Occupational Health - Occupational Stress Questionnaire    Feeling of Stress : Only a little  Social Connections: Moderately Isolated (08/28/2023)   Social Connection and Isolation Panel    Frequency of Communication with Friends and Family: More than three times a week    Frequency of Social Gatherings with Friends and Family: Never    Attends Religious Services: Never    Database administrator or Organizations: Yes    Attends Engineer, structural: More than 4 times per year    Marital Status: Divorced  Intimate Partner Violence: Not At Risk (08/28/2023)   Humiliation, Afraid, Rape, and Kick questionnaire    Fear of Current or Ex-Partner: No    Emotionally Abused: No    Physically Abused: No    Sexually Abused: No     PHYSICAL EXAM  GENERAL EXAM/CONSTITUTIONAL: Vitals:  Vitals:   08/24/24 1133  BP: (!) 152/78  Pulse: (!) 57  Weight: 167 lb 9.6  oz (76 kg)  Height: 5' (1.524 m)   Body mass index is 32.73 kg/m. Wt Readings from Last 3 Encounters:  08/24/24 167 lb 9.6 oz (76 kg)  08/02/24 168 lb 2 oz (76.3 kg)  06/08/24 170 lb 6.4 oz (77.3 kg)   Patient is in no distress; well developed, nourished and groomed; neck is supple  CARDIOVASCULAR: Examination of carotid arteries is normal; no carotid bruits Regular rate and rhythm, no murmurs Examination of peripheral vascular system by observation and palpation is normal  EYES: Ophthalmoscopic exam of optic discs and posterior segments is normal; no papilledema or hemorrhages No results found.  MUSCULOSKELETAL: Gait, strength, tone, movements noted in Neurologic exam below  NEUROLOGIC: MENTAL STATUS:      No data to display         awake, alert, oriented to person, place and time recent and remote memory intact normal attention and concentration language fluent, comprehension intact, naming intact fund of knowledge appropriate  CRANIAL NERVE:  2nd - no papilledema on fundoscopic exam 2nd, 3rd, 4th, 6th - pupils equal and reactive to light, visual fields full to confrontation, extraocular muscles intact, no nystagmus 5th - facial sensation symmetric 7th - facial strength symmetric 8th - hearing intact 9th - palate elevates symmetrically, uvula midline 11th - shoulder shrug symmetric 12th - tongue protrusion midline  MOTOR:  normal bulk and tone, full strength in the BUE, BLE  SENSORY:  normal and symmetric to light touch, pinprick, temperature, vibration; EXCEPT DECR IN BOTTOM OF FEET TO PP  COORDINATION:  finger-nose-finger, fine finger movements normal  REFLEXES:  deep tendon reflexes 1+ and symmetric  GAIT/STATION:  narrow based gait; SLOW AND CAUTIOUS; SLIGHTLY UNSTEADY     DIAGNOSTIC DATA (LABS, IMAGING, TESTING) - I reviewed patient records, labs, notes, testing and imaging myself where available.  Lab Results  Component Value Date   WBC  5.9 04/05/2024   HGB 12.6 04/05/2024  HCT 36.1 04/05/2024   MCV 94.5 04/05/2024   PLT 191 04/05/2024      Component Value Date/Time   NA 140 04/05/2024 1439   NA 140 01/10/2020 0950   K 4.1 04/05/2024 1439   CL 105 04/05/2024 1439   CO2 28 04/05/2024 1439   GLUCOSE 94 04/05/2024 1439   BUN 22 04/05/2024 1439   BUN 15 01/10/2020 0950   CREATININE 0.94 04/05/2024 1439   CALCIUM  10.4 04/05/2024 1439   PROT 6.3 04/05/2024 1439   PROT 7.0 01/10/2020 0950   ALBUMIN 4.2 12/05/2020 1439   ALBUMIN 4.2 12/05/2020 1439   ALBUMIN 4.7 01/10/2020 0950   AST 18 04/05/2024 1439   ALT 13 04/05/2024 1439   ALKPHOS 72 12/05/2020 1439   BILITOT 1.2 04/05/2024 1439   BILITOT 0.8 01/10/2020 0950   GFRNONAA 64 01/10/2020 0950   GFRAA 74 01/10/2020 0950   Lab Results  Component Value Date   CHOL 191 06/02/2024   HDL 61 06/02/2024   LDLCALC 115 (H) 06/02/2024   TRIG 83 06/02/2024   CHOLHDL 3.1 06/02/2024   Lab Results  Component Value Date   HGBA1C 5.0 04/05/2024   Lab Results  Component Value Date   VITAMINB12 525 04/05/2024   Lab Results  Component Value Date   TSH 2.18 01/22/2022    03/13/22 MRI lumbar spine - Overall unchanged degenerative findings in the lumbar spine, with 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    ASSESSMENT AND PLAN  83 y.o. year old female here with:   Dx:  1. Numbness in feet   2. 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    EDUARD FABIENE HANLON, MD 08/24/2024, 12:24 PM Certified in Neurology, Neurophysiology and Neuroimaging  [facility] [address] [place], [place] [address] [phone]

## 2024-08-26 ENCOUNTER — Ambulatory Visit: Admitting: Internal Medicine

## 2024-08-30 DIAGNOSIS — Z961 Presence of intraocular lens: Secondary | ICD-10-CM | POA: Diagnosis not present

## 2024-08-30 DIAGNOSIS — H35372 Puckering of macula, left eye: Secondary | ICD-10-CM | POA: Diagnosis not present

## 2024-09-01 ENCOUNTER — Ambulatory Visit: Admitting: Internal Medicine

## 2024-09-01 ENCOUNTER — Encounter: Payer: Self-pay | Admitting: Internal Medicine

## 2024-09-01 VITALS — BP 100/60 | HR 60 | Temp 99.1°F | Ht 60.0 in | Wt 167.8 lb

## 2024-09-01 DIAGNOSIS — G4733 Obstructive sleep apnea (adult) (pediatric): Secondary | ICD-10-CM | POA: Diagnosis not present

## 2024-09-01 DIAGNOSIS — J4520 Mild intermittent asthma, uncomplicated: Secondary | ICD-10-CM | POA: Diagnosis not present

## 2024-09-01 DIAGNOSIS — Z23 Encounter for immunization: Secondary | ICD-10-CM | POA: Diagnosis not present

## 2024-09-01 NOTE — Patient Instructions (Signed)
 Excellent Job A GOLD STAR!!  Continue CPAP as prescribed  Patient Instructions Continue to use CPAP every night, minimum of 4-6 hours a night.  Change equipment every 30 days or as directed by DME.  Wash your tubing with warm soap and water daily, hang to dry. Wash humidifier portion weekly. Use bottled, distilled water and change daily   Be aware of reduced alertness and do not drive or operate heavy machinery if experiencing this or drowsiness.  Exercise encouraged, as tolerated. Encouraged proper weight management.  Important to get eight or more hours of sleep  Limiting the use of the computer and television before bedtime.  Decrease naps during the day, so night time sleep will become enhanced.  Limit caffeine, and sleep deprivation.    Avoid Allergens and Irritants Avoid secondhand smoke Avoid SICK contacts Recommend  Masking  when appropriate Recommend Keep up-to-date with vaccinations  Use inhalers as needed Flu vaccine today

## 2024-09-01 NOTE — Progress Notes (Unsigned)
 @Patient  ID: Molly Faulkner, female    DOB: [birth year], 83 y.o.   MRN: [id number]  SYNOPSIS 83 year old female, former smoker (3.5-pack-year history).  History significant for HTN, chronic seasonal allergies, hyperlipidemia, asthma and OSA.      CC Follow-up assessment for asthma Follow-up assessment for OSA  HPI:  Discussed sleep data and reviewed with patient.  Encouraged proper weight management.  Discussed driving precautions and its relationship with hypersomnolence.  Discussed sleep hygiene, and benefits of a fixed sleep waked time.  The importance of getting eight or more hours of sleep discussed with patient.  Discussed limiting the use of the computer and television before bedtime.  Decrease naps during the day, so night time sleep will become enhanced.  Limit caffeine, and sleep deprivation.   Patient uses and benefits from therapy Using CPAP nightly and with naps Pressure setting is comfortable and is sleeping well. 100% compliance for greater than 4 hours Patient uses nasal pillows Excellent compliance report AHI significantly reduced to 0.3    Assessment of asthma No exacerbation at this time No evidence of heart failure at this time No evidence or signs of infection at this time No respiratory distress No fevers, chills, nausea, vomiting, diarrhea No evidence of lower extremity edema No evidence hemoptysis Asthma is well-controlled Well-controlled with avoidance of allergens No maintenance therapy at this time Uses albuterol  as needed   Allergies  Allergen Reactions   Avelox  [Moxifloxacin  Hcl In Nacl] Other (See Comments)    Chills   Moxifloxacin      Other Reaction(s): Not available  moxifloxacin    Statins Other (See Comments)    Dark urine and leg weakness   Trazodone  And Nefazodone Swelling   Clarithromycin Other (See Comments)    Unsure of reaction type  Other Reaction(s): Not available  clarithromycin    Immunization History   Administered Date(s) Administered   Fluad Quad(high Dose 65+) 09/14/2019, 09/06/2020, 09/10/2021, 10/18/2022   Influenza,inj,Quad PF,6+ Mos 09/06/2014, 08/23/2015, 09/09/2016, 09/22/2017, 09/23/2018   Influenza-Unspecified 08/16/2013   PFIZER(Purple Top)SARS-COV-2 Vaccination 01/20/2020, 02/10/2020, 09/14/2020   Pneumococcal Conjugate-13 02/14/2016   Pneumococcal Polysaccharide-23 01/17/2012   Tdap 12/17/2011   Zoster, Live 12/17/2011    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     albuterol  (VENTOLIN  HFA) 108 (90 Base) MCG/ACT inhaler Inhale 2 puffs into  the lungs every 4 (four) hours as needed for wheezing or shortness of breath. 8 g 8   aspirin  EC 81 MG tablet Take 1 tablet (81 mg total) by mouth daily. Swallow whole.     atenolol  (TENORMIN ) 25 MG tablet TAKE 1 TABLET EVERY DAY 90 tablet 3   CREAM BASE EX Apply 1-2 application topically 4 (four) times daily as needed (for arthritis pain.). Penetrex Inflammation Formula  (Arnica/Pyridoxine/MSM/Boswellia/Cetyl Myristoleate)     cyclobenzaprine  (FLEXERIL ) 5 MG tablet TAKE 1/2 TABLET(2.5 MG) BY MOUTH AT BEDTIME 30 tablet 0   ezetimibe  (ZETIA ) 10 MG tablet Take 1 tablet (10 mg total) by mouth daily. (Patient taking differently: Take 5 mg by mouth every other day.) 90 tablet 3   hydrochlorothiazide  (HYDRODIURIL ) 25 MG tablet Take 1 tablet (25 mg total) by mouth daily. TAKE 1 TABLET EVERY DAY 90 tablet 3   HYDROcodone -acetaminophen  (NORCO/VICODIN) 5-325 MG tablet Take 1 tablet by mouth every 8 (eight) hours as needed for moderate pain. 15 tablet 0   hydrOXYzine  (ATARAX ) 10 MG tablet Take 1 tablet (10 mg total) by mouth at bedtime. 90 tablet 0   ibuprofen (ADVIL) 400 MG tablet Take 400 mg by mouth as needed for moderate pain (pain score 4-6).     levocetirizine (XYZAL ) 5 MG tablet TAKE 1 TABLET(5 MG) BY MOUTH EVERY EVENING 90 tablet 1   losartan  (COZAAR ) 50 MG tablet Take 1 tablet (50 mg total) by mouth 2 (two) times daily. 90 tablet 3   Multiple Vitamins-Minerals (EYE MULTIVITAMIN PO) Take by mouth. [employer]     Suvorexant  (BELSOMRA ) 5 MG TABS Take 1 tablet (5 mg total) by mouth at bedtime as needed. 30 tablet 0   Vitamin D , Ergocalciferol , (DRISDOL ) 1.25 MG (50000 UNIT) CAPS capsule Take 1 capsule (50,000 Units total) by mouth once a week. For 12 weeks. Then start OTC Vitamin D3 2,000 unit daily. 12 capsule 0   No facility-administered medications prior to visit.   There were no vitals taken for this visit.      Review of Systems: Gen:  Denies  fever, sweats, chills weight loss  HEENT: Denies blurred vision, double vision, ear pain, eye pain, hearing loss, nose bleeds, sore throat Cardiac:  No dizziness, chest pain or heaviness, chest tightness,edema, No JVD Resp:   No cough, -sputum production, -shortness of breath,-wheezing, -hemoptysis,  Other:  All other systems negative   Physical Examination:   General Appearance: No distress  EYES PERRLA, EOM  intact.   NECK Supple, No JVD Pulmonary: normal breath sounds, No wheezing.  CardiovascularNormal S1,S2.  No m/r/g.   Abdomen: Benign, Soft, non-tender. Neurology UE/LE 5/5 strength, no focal deficits Ext pulses intact, cap refill intact ALL OTHER ROS ARE NEGATIVE CBC    Component Value Date/Time   WBC 5.9 04/05/2024 1439   RBC 3.82 04/05/2024 1439   HGB 12.6 04/05/2024 1439   HGB 13.9 01/10/2020 0950   HCT 36.1 04/05/2024 1439   HCT 40.1 01/10/2020 0950   PLT 191 04/05/2024 1439   PLT 227 01/10/2020 0950   MCV 94.5 04/05/2024 1439   MCV 97 01/10/2020 0950   MCH 33.0 04/05/2024 1439   MCHC 34.9 04/05/2024 1439   RDW 12.1 04/05/2024 1439   RDW 12.3 01/10/2020 0950   LYMPHSABS 1.0 01/10/2020 0950   EOSABS 0.3 01/10/2020 0950   BASOSABS 0.0 01/10/2020 0950    BMET    Component Value Date/Time   NA 140 04/05/2024 1439   NA 140 01/10/2020 0950  K 4.1 04/05/2024 1439   CL 105 04/05/2024 1439   CO2 28 04/05/2024 1439   GLUCOSE 94 04/05/2024 1439   BUN 22 04/05/2024 1439   BUN 15 01/10/2020 0950   CREATININE 0.94 04/05/2024 1439   CALCIUM  10.4 04/05/2024 1439   GFRNONAA 64 01/10/2020 0950   GFRAA 74 01/10/2020 0950      Assessment & Plan:   83 year old [place] female seen today for follow-up assessment for sleep apnea and follow-up assessment for asthma   Assessment of OSA Previous AHI 25 Continue CPAP as prescribed  Excellent compliance report Reviewed compliance report in detail with patient Patient definitely benefits the use of CPAP therapy as prescribed Using CPAP nightly and with naps Pressure setting is comfortable and is sleeping well. CPAP prescription 10 AHI reduced to 0.2  No evidence of acute heart failure at this time No respiratory distress No fevers, chills, nausea, vomiting, diarrhea No evidence hemoptysis  Patient Instructions Continue to use CPAP every night, minimum of 4-6 hours a night.  Change equipment every 30 days or as  directed by DME.  Wash your tubing with warm soap and water daily, hang to dry. Wash humidifier portion weekly. Use bottled, distilled water and change daily   Be aware of reduced alertness and do not drive or operate heavy machinery if experiencing this or drowsiness.  Exercise encouraged, as tolerated. Encouraged proper weight management.  Important to get eight or more hours of sleep  Limiting the use of the computer and television before bedtime.  Decrease naps during the day, so night time sleep will become enhanced.  Limit caffeine, and sleep deprivation.  HTN, stroke, uncontrolled diabetes and heart failure are potential risk factors.  Risk of untreated sleep apnea including cardiac arrhthymias, stroke, DM, pulm HTN.    Assessment of asthma Mild intermittent well-controlled No exacerbation at this time No indication for maintenance therapy Albuterol  as needed Trelegy inhaler prescribed as needed for travel Flu vaccine to be given today   MEDICATION ADJUSTMENTS/LABS AND TESTS ORDERED: Continue CPAP as prescribed Use Trelegy inhaler as needed Use albuterol  2 puffs every 4-6 hours as needed Avoid Allergens and Irritants Avoid secondhand smoke Avoid SICK contacts Recommend  Masking  when appropriate Recommend Keep up-to-date with vaccinations Flu vaccine today 09/01/2024   CURRENT MEDICATIONS REVIEWED AT LENGTH WITH PATIENT TODAY   Patient  satisfied with Plan of action and management. All questions answered   Follow up 1 year   I spent a total of 42 minutes dedicated to the care of this patient on the date of this encounter to include pre-visit review of records, face-to-face time with the patient discussing conditions above, post visit ordering of testing, clinical documentation with the electronic health record, making appropriate referrals as documented, and communicating necessary information to the patient's healthcare team.    The Patient requires high  complexity decision making for assessment and support, frequent evaluation and titration of therapies, application of advanced monitoring technologies and extensive interpretation of multiple databases.  Patient satisfied with Plan of action and management. All questions answered    Nickolas Alm Cellar, M.D.  [facility]  Medical Director [facility]: [facility] Medical Director [facility] Cardio-Pulmonary Department

## 2024-09-03 ENCOUNTER — Ambulatory Visit: Payer: Medicare HMO

## 2024-09-22 ENCOUNTER — Encounter: Payer: Self-pay | Admitting: Physical Therapy

## 2024-09-22 ENCOUNTER — Ambulatory Visit: Attending: Diagnostic Neuroimaging | Admitting: Physical Therapy

## 2024-09-22 DIAGNOSIS — R269 Unspecified abnormalities of gait and mobility: Secondary | ICD-10-CM | POA: Diagnosis not present

## 2024-09-22 DIAGNOSIS — R200 Anesthesia of skin: Secondary | ICD-10-CM | POA: Diagnosis not present

## 2024-09-22 DIAGNOSIS — R2681 Unsteadiness on feet: Secondary | ICD-10-CM | POA: Insufficient documentation

## 2024-09-22 NOTE — Therapy (Unsigned)
 OUTPATIENT PHYSICAL THERAPY LOWER EXTREMITY EVALUATION   Patient Name: Molly Faulkner MRN: [id number] DOB:1941/03/17, 83 y.o., female Today's Date: 09/22/2024  END OF SESSION:  PT End of Session - 09/22/24 2233     Visit Number 1    Number of Visits 24    Date for Recertification  12/01/24    Authorization Type [employer] [id number]    Progress Note Due on Visit 10    PT Start Time 1600    PT Stop Time 1645    PT Time Calculation (min) 45 min    Activity Tolerance Patient tolerated treatment well    Behavior During Therapy [facility] for tasks assessed/performed          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   Sleep apnea    H/O   NO LONGER   Syncope    Past Surgical History:  Procedure Laterality Date   ABDOMINAL HYSTERECTOMY  1994   total   BREAST BIOPSY Bilateral 8018,8016   BREAST EXCISIONAL BIOPSY     CARPAL TUNNEL RELEASE Bilateral    CATARACT EXTRACTION W/PHACO Right 12/02/2017   Procedure: CATARACT EXTRACTION PHACO AND INTRAOCULAR LENS PLACEMENT (IOC);  Surgeon: Jaye Fallow, MD;  Location: [facility] ORS;  Service: Ophthalmology;  Laterality: Right;  US  00:36 AP% 11.5 CDE 4.21 Fluid pack lot # [id number]   CATARACT EXTRACTION W/PHACO Left 12/30/2017   Procedure: CATARACT EXTRACTION PHACO AND INTRAOCULAR LENS PLACEMENT (IOC);  Surgeon: Jaye Fallow, MD;  Location: [facility] ORS;  Service: Ophthalmology;  Laterality: Left;  US  00:58 AP% 11.6 CDE 6.80 Fluid pack lot # [id number]   COLONOSCOPY WITH PROPOFOL  N/A 06/30/2018   Procedure: COLONOSCOPY WITH PROPOFOL ;  Surgeon: Therisa Bi, MD;   Location[facility] ENDOSCOPY;  Service: Gastroenterology;  Laterality: N/A;   COLONOSCOPY WITH PROPOFOL  N/A 01/07/2022   Procedure: COLONOSCOPY WITH PROPOFOL ;  Surgeon: Unk Corinn Skiff, MD;  Location: [facility] ENDOSCOPY;  Service: Gastroenterology;  Laterality: N/A;   DILATION AND CURETTAGE OF UTERUS     X 2   KNEE ARTHROSCOPY     Patient Active Problem List   Diagnosis Date Noted   Aortic atherosclerosis 04/05/2024   Peripheral neuropathy 04/05/2024   Class 1 obesity due to excess calories with body mass index (BMI) of 32.0 to 32.9 in adult 09/05/2022   Osteoarthritis of lower back 05/29/2018   OSA on CPAP 07/01/2016   Hyperlipidemia 02/21/2015   Insomnia 07/25/2014   HTN (hypertension) 07/25/2014   GERD 07/25/2014   Depression 07/25/2014   Asthma, moderate persistent, well-controlled 07/25/2014    PCP: Dr. Angeline Laura     REFERRING PROVIDER: Dr. Eduard Haggard    REFERRING DIAG:  Diagnosis  R20.0 (ICD-10-CM) - Numbness in feet  R26.9 (ICD-10-CM) - Gait difficulty    THERAPY DIAG:  Unsteadiness on feet  Rationale for Evaluation and Treatment: Rehabilitation  ONSET DATE: >5 years ago    SUBJECTIVE:   SUBJECTIVE STATEMENT: Pt reports that she typically feels unsteadiness in afternoons after  she feels the full affect of taking her blood pressure meds. 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 She also has a history of left total hip and now she has increased pain in her right hip which she describes as sharp and that causes her to feel unsteady.     PERTINENT HISTORY: Per Dr. 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 Yes: NPRS scale: 2-3/10   Pain  location: Groin of right hip   Pain description: Achy and sometimes sharp   Aggravating factors: Walking  Relieving factors: Sitting still    PRECAUTIONS: Fall  RED FLAGS: None   WEIGHT BEARING RESTRICTIONS: No  FALLS:  Has patient fallen in last 6 months? No  LIVING ENVIRONMENT: Lives with: lives alone Lives in: House/apartment Stairs: No Has following equipment at home: Single point cane  OCCUPATION: Retired   PLOF: Independent  PATIENT GOALS: She wants to improve her balance.    NEXT MD VISIT: Did not ask    OBJECTIVE:  Note: Objective measures were completed at Evaluation unless otherwise noted.  VITALS  BP 112/52  HR 66 SpO2 100%  DIAGNOSTIC FINDINGS: CLINICAL DATA:  Low back pain, radiating into bilateral hips and feet   EXAM: MRI LUMBAR SPINE WITHOUT CONTRAST   TECHNIQUE: Multiplanar, multisequence MR imaging of the lumbar spine was performed. No intravenous contrast was administered.   COMPARISON:  09/23/2019   FINDINGS: Segmentation:  Standard.   Alignment: 3 mm retrolisthesis L2 on L3, unchanged. 4 mm anterolisthesis L4 on L5, unchanged. Levocurvature of the lumbar spine.   Vertebrae:  No acute fracture or suspicious osseous lesion.   Conus medullaris and cauda equina: Conus extends to the L1 level. Conus and cauda equina appear normal.   Paraspinal and other soft tissues: Atrophy of the inferior paraspinous muscles.   Disc levels:   T12-L1: Disc height loss and mild disc bulge. Mild facet arthropathy. No spinal canal stenosis or neural foraminal narrowing.   L1-L2: Disc height loss and disc osteophyte complex. Mild facet arthropathy. Narrowing of the lateral recesses. Mild spinal canal stenosis and mild bilateral neural foraminal narrowing, unchanged.   L2-L3: Mild retrolisthesis. Disc osteophyte complex. Mild facet arthropathy. Narrowing of the lateral recesses. Mild spinal canal stenosis. Mild-to-moderate bilateral neural foraminal  narrowing, unchanged.   L3-L4: Broad-based disc bulge. Mild facet arthropathy. No spinal canal stenosis. Mild bilateral neural foraminal narrowing, unchanged.   L4-L5: Anterolisthesis with disc unroofing and broad-based disc bulge. Severe left and moderate right facet arthropathy. No spinal canal stenosis. Mild narrowing of the lateral recesses. Mild left neural foraminal narrowing, which appears unchanged.   L5-S1: Disc height loss with broad-based disc osteophyte complex. Mild facet arthropathy. No spinal canal stenosis. Mild-to-moderate bilateral neural foraminal narrowing, unchanged.   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     Electronically Signed   By: Donald Campion M.D.   On: 03/13/2022 19:55    PATIENT SURVEYS:  ABC scale: The Activities-Specific Balance Confidence (ABC) Scale 0% 10 20 30  40 50 60 70 80 90 100% No confidence<->completely confident  "How confident are you that you will not lose your balance or become unsteady when you . . .   Date tested 09/22/24  Walk around the house 70%  2. Walk up or down stairs 50%  3. Bend over and pick up a slipper from in front of a closet floor 70%  4. Reach for a small can off a shelf at eye level 90%  5. Stand on tip toes and reach for something above your head 80%  6. Stand on a chair and reach for something 20%  7. Sweep the floor 100%  8. Walk outside the house to a car parked in the driveway 100%  9. Get into or out of a car 100%  10. Walk across a parking lot to the mall 100%  11. Walk up or down a ramp 100%  12. Walk in a crowded mall where people rapidly walk past you 80%  13. Are bumped into by people as you walk through the mall 70%  14. Step onto or off of an escalator while you are  holding onto the railing 80%  15. Step onto or off an escalator while holding onto parcels such that you cannot hold onto the railing 70%  16. Walk outside on icy sidewalks 30%  Total: #/16  76%     COGNITION: Overall cognitive status: Within functional limits for tasks assessed     SENSATION: Light touch: Impaired Bottom of feet but does not radiate down from low back      MUSCLE LENGTH: Not tested    Hamstrings: NT Thomas test: NT    POSTURE: No Significant postural limitations  PALPATION: Not performed     LOWER EXTREMITY ROM:  Active ROM Right eval Left eval  Hip flexion    Hip extension    Hip abduction    Hip adduction    Hip internal rotation    Hip external rotation    Knee flexion    Knee extension    Ankle dorsiflexion    Ankle plantarflexion    Ankle inversion    Ankle eversion     (Blank rows = not tested)  LOWER EXTREMITY MMT:  MMT Right eval Left eval  Hip flexion    Hip extension    Hip abduction    Hip adduction    Hip internal rotation    Hip external rotation    Knee flexion    Knee extension    Ankle dorsiflexion    Ankle plantarflexion    Ankle inversion    Ankle eversion     (Blank rows = not tested)  LOWER EXTREMITY SPECIAL TESTS:  None performed    FUNCTIONAL TESTS:  5 times sit to stand: 18 sec   30 seconds chair stand test 7 reps   Dynamic Gait Index: Dynamic Gait Index  Mark the lowest level that applies.   Date Performed   Gait level surface   2. Change in gait speed   3. Gait with horizontal head turns   4. Gait with vertical head turns   5. Gait and pivot turn   6. Step over obstacle   7. Step around obstacle   8. Steps   Total score     Score Interpretation: Score of <19 indicates high risk of falls.  Minimally Clinically Important Difference (MCID):  =DGI scores of<21/24 = 1.80 points DGI scores of >21/24 = 0.60 points   Robert Lee  T, Inbar-Borovsky N, Brozgol M, Giladi N, Hausdorff JM. The Dynamic  Gait Index in healthy older adults: the role of stair climbing, fear of falling and gender. Gait Posture. 2009 Feb;29(2):237-41. doi: 10.1016/j.gaitpost.2008.08.013. Epub 2008 Oct 8. PMID: 81154560; PMCID: EFR7290501.  Pardasaney, MYRTIS LOIS Bonus, GEANNIE POUR., et al. (2012). Sensitivity to change and responsiveness of four balance measures for community-dwelling older adults. Physical therapy 92(3): 388-397.  MCTSIB: Condition 1: Avg of 3 trials: 30 sec, Condition 2: Avg of 3 trials: 30 sec, Condition 3: Avg of 3 trials: 30 sec, Condition 4: Avg of 3 trials: 30 sec, and Total Score: 120/120  GAIT: Distance walked: 50  Assistive device utilized: None Level of assistance: Complete Independence Comments: Slow gait with decreased step length.                                                                                                                                 TREATMENT DATE:   09/22/24     THEREX  Sit to Stand 2 x 10   -min VC to perform stand quickly and sit slowly   Sit to Stand with UE support 2 x 10  -Pt better able to control eccentric phase of sit to stand   PATIENT EDUCATION:  Education details: Form and technique for  Person educated: Patient  Education method: Programmer, multimedia, Demonstration, Verbal cues, and Handouts Education comprehension: verbalized understanding, returned demonstration, and verbal cues required  HOME EXERCISE PROGRAM: Sit to Stand  3 sets of 10 reps  3-4 days per week   ASSESSMENT:  CLINICAL IMPRESSION: Patient is a 83 y.o. AA who was seen today for physical therapy evaluation and treatment for imbalance in the setting of neuropathy and right hip OA. Her deficits include decreased LE strength and endurance that place her at increased risk for falls.Despite neuropathy, pt did not have decreased balance due sensory impairments as evidenced by results from modified CTSIB.Due to time constraints, unable to complete all testing, so more to be completed next  session. Despite reporting fear of falling, pt does show balance confidence that places her above the cut off for risk for falls. She will continue to benefit from skilled PT to improve LE strength and endurance and potentially other deficits to decrease her risk of fallling.   OBJECTIVE IMPAIRMENTS: Abnormal gait, decreased balance, decreased endurance, decreased mobility, difficulty walking, decreased strength, hypomobility, impaired flexibility, impaired sensation, and pain.   ACTIVITY LIMITATIONS: carrying, lifting, bending, squatting, stairs, dressing, and locomotion level  PARTICIPATION LIMITATIONS: cleaning, shopping, community activity, yard work, and school  PERSONAL FACTORS: Age, Fitness, and 3+ comorbidities: Depreession, HLD, HTN  are also affecting patient's functional outcome.   REHAB POTENTIAL: Good  CLINICAL DECISION MAKING: Stable/uncomplicated  EVALUATION COMPLEXITY: Low   GOALS: Goals reviewed with patient? No  SHORT TERM GOALS: Target date: 10/06/2024   Patient will demonstrate undestanding of home exercise plan by performing exercises correctly with evidence of good carry  over with min to no verbal or tactile cues .   Baseline: NT   Goal status: INITIAL  2.  Patient will show decreased risk of falling as evidence by decrease in self-selected gait speed by >= 0.12 m/sec.  Baseline: NT   Goal status: INITIAL  3.  Pt will decrease 5TSTS by at least 3 seconds to demonstrate a minimal clinically significant improvement in LE strength.  Baseline: 18 sec   Goal status: INITIAL  4. Patient will improve there distance walked by >=201 ft during as evidence of improved lumbar and right knee function and to return to walking longer distances for community level activities without being limited by pain and discomfort Debora et al, 2005).    Baseline: 18 sec   Goal status: INITIAL  LONG TERM GOALS: Target date: 12/14/2025   Patient will improve activities  specific balance scale score >=67% as evidence of an improvement self-perception of function and to decrease risk for falls Mae et al., 2022).  Baseline: 76% Goal status: Deferred    2.  Patient will perform in >=1.0 m./sec as evidence of improved mobility, decreased falls risk, and overall functional independence.  Baseline:  Goal status: INITIAL  3.  Patient will demonstrate reduced falls risk as evidenced by Dynamic Gait Index (DGI) >19/24 to decrease risk of falling.  Baseline: NT   Goal status: INITIAL  4.  Patient will ambulate >=1000 ft in to show community ambulator status to be able to have aerobic endurance to return to fulfilling walking tasks at his job.  Baseline: NT   Goal status: INITIAL    PLAN:  PT FREQUENCY: 1-2x/week  PT DURATION: 12 weeks  PLANNED INTERVENTIONS: 97164- PT Re-evaluation, 97750- Physical Performance Testing, 97110-Therapeutic exercises, 97530- Therapeutic activity, [id number]- Neuromuscular re-education, 97535- Self Care, [id number]- Manual therapy, [id number]- Gait training, [phone]- Canalith repositioning, [id number]- Aquatic Therapy, [phone]- Electrical stimulation (unattended), [phone]- Electrical stimulation (manual), [id number]- Traction (mechanical), 20560 (1-2 muscles), 20561 (3+ muscles)- Dry Needling, Patient/Family education, Balance training, Stair training, Taping, Joint mobilization, Joint manipulation, Spinal manipulation, Spinal mobilization, Vestibular training, DME instructions, Cryotherapy, and Moist heat  PLAN FOR NEXT SESSION: DGI, , .  Toribio Servant PT, DPT  [facility] [address], [place], [address] Phone: [phone]   Fax:  [phone]

## 2024-09-27 ENCOUNTER — Other Ambulatory Visit: Payer: Self-pay | Admitting: Internal Medicine

## 2024-09-27 DIAGNOSIS — Z1231 Encounter for screening mammogram for malignant neoplasm of breast: Secondary | ICD-10-CM

## 2024-09-28 ENCOUNTER — Ambulatory Visit: Admitting: Physical Therapy

## 2024-09-28 DIAGNOSIS — R2681 Unsteadiness on feet: Secondary | ICD-10-CM | POA: Diagnosis not present

## 2024-09-28 DIAGNOSIS — R269 Unspecified abnormalities of gait and mobility: Secondary | ICD-10-CM | POA: Diagnosis not present

## 2024-09-28 DIAGNOSIS — R200 Anesthesia of skin: Secondary | ICD-10-CM | POA: Diagnosis not present

## 2024-09-28 NOTE — Therapy (Signed)
 OUTPATIENT PHYSICAL THERAPY LOWER EXTREMITY TREATMENT    Patient Name: Molly Faulkner MRN: [id number] DOB:1941-07-11, 83 y.o., female Today's Date: 09/28/2024  END OF SESSION:  PT End of Session - 09/28/24 1437     Visit Number 2    Number of Visits 24    Date for Recertification  12/01/24    Authorization Type [employer] 2025    Authorization Time Period 20 VISITS 10/18-12/21    Authorization - Visit Number 20    Authorization - Number of Visits 2    Progress Note Due on Visit 10    PT Start Time 1430    PT Stop Time 1615    PT Time Calculation (min) 105 min    Activity Tolerance Patient tolerated treatment well    Behavior During Therapy WFL for tasks assessed/performed          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   Sleep apnea    H/O   NO LONGER   Syncope    Past Surgical History:  Procedure Laterality Date   ABDOMINAL HYSTERECTOMY  1994   total   BREAST BIOPSY Bilateral 8018,8016   BREAST EXCISIONAL BIOPSY     CARPAL TUNNEL RELEASE Bilateral    CATARACT EXTRACTION W/PHACO Right 12/02/2017   Procedure: CATARACT EXTRACTION PHACO AND INTRAOCULAR LENS PLACEMENT (IOC);  Surgeon: Jaye Fallow, MD;  Location: [facility] ORS;  Service: Ophthalmology;  Laterality: Right;  US  00:36 AP% 11.5 CDE 4.21 Fluid pack lot # [id number]   CATARACT EXTRACTION W/PHACO Left 12/30/2017   Procedure: CATARACT EXTRACTION PHACO AND INTRAOCULAR LENS PLACEMENT (IOC);  Surgeon: Jaye Fallow, MD;  Location: [facility] ORS;  Service: Ophthalmology;  Laterality: Left;  US  00:58 AP% 11.6 CDE 6.80 Fluid pack  lot # [id number]   COLONOSCOPY WITH PROPOFOL  N/A 06/30/2018   Procedure: COLONOSCOPY WITH PROPOFOL ;  Surgeon: Therisa Bi, MD;  Location: [facility] ENDOSCOPY;  Service: Gastroenterology;  Laterality: N/A;   COLONOSCOPY WITH PROPOFOL  N/A 01/07/2022   Procedure: COLONOSCOPY WITH PROPOFOL ;  Surgeon: Unk Corinn Skiff, MD;  Location: [facility] ENDOSCOPY;  Service: Gastroenterology;  Laterality: N/A;   DILATION AND CURETTAGE OF UTERUS     X 2   KNEE ARTHROSCOPY     Patient Active Problem List   Diagnosis Date Noted   Aortic atherosclerosis 04/05/2024   Peripheral neuropathy 04/05/2024   Class 1 obesity due to excess calories with body mass index (BMI) of 32.0 to 32.9 in adult 09/05/2022   Osteoarthritis of lower back 05/29/2018   OSA on CPAP 07/01/2016   Hyperlipidemia 02/21/2015   Insomnia 07/25/2014   HTN (hypertension) 07/25/2014   GERD 07/25/2014   Depression 07/25/2014   Asthma, moderate persistent, well-controlled 07/25/2014    PCP: Dr. Angeline Laura     REFERRING PROVIDER: Dr. Eduard Haggard    REFERRING DIAG:  Diagnosis  R20.0 (ICD-10-CM) - Numbness in feet  R26.9 (ICD-10-CM) - Gait difficulty    THERAPY DIAG:  Unsteadiness on feet  Rationale for Evaluation  and Treatment: Rehabilitation  ONSET DATE: >5 years ago    SUBJECTIVE:   SUBJECTIVE STATEMENT: Pt reports worsening knee and low back pain after performing sit to stands. She modified exercise to perform sit to stand on a higher surface to decrease knee and back pain. She wonders if there are exercise machines she can use to strengthen her lower extremities because she has access to gym equip.   PERTINENT HISTORY: Per Dr. 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 Yes: NPRS scale: 2-3/10    Pain location: Groin of right hip   Pain description: Achy and sometimes sharp   Aggravating factors: Walking  Relieving factors: Sitting still    PRECAUTIONS: Fall  RED FLAGS: None   WEIGHT BEARING RESTRICTIONS: No  FALLS:  Has patient fallen in last 6 months? No  LIVING ENVIRONMENT: Lives with: lives alone Lives in: House/apartment Stairs: No Has following equipment at home: Single point cane  OCCUPATION: Retired   PLOF: Independent  PATIENT GOALS: She wants to improve her balance.    NEXT MD VISIT: Did not ask    OBJECTIVE:  Note: Objective measures were completed at Evaluation unless otherwise noted.  VITALS  BP 112/52  HR 66 SpO2 100%  DIAGNOSTIC FINDINGS: CLINICAL DATA:  Low back pain, radiating into bilateral hips and feet   EXAM: MRI LUMBAR SPINE WITHOUT CONTRAST   TECHNIQUE: Multiplanar, multisequence MR imaging of the lumbar spine was performed. No intravenous contrast was administered.   COMPARISON:  09/23/2019   FINDINGS: Segmentation:  Standard.   Alignment: 3 mm retrolisthesis L2 on L3, unchanged. 4 mm anterolisthesis L4 on L5, unchanged. Levocurvature of the lumbar spine.   Vertebrae:  No acute fracture or suspicious osseous lesion.   Conus medullaris and cauda equina: Conus extends to the L1 level. Conus and cauda equina appear normal.   Paraspinal and other soft tissues: Atrophy of the inferior paraspinous muscles.   Disc levels:   T12-L1: Disc height loss and mild disc bulge. Mild facet arthropathy. No spinal canal stenosis or neural foraminal narrowing.   L1-L2: Disc height loss and disc osteophyte complex. Mild facet arthropathy. Narrowing of the lateral recesses. Mild spinal canal stenosis and mild bilateral neural foraminal narrowing, unchanged.   L2-L3: Mild retrolisthesis. Disc osteophyte complex. Mild facet arthropathy. Narrowing of the lateral recesses. Mild spinal canal stenosis. Mild-to-moderate bilateral neural  foraminal narrowing, unchanged.   L3-L4: Broad-based disc bulge. Mild facet arthropathy. No spinal canal stenosis. Mild bilateral neural foraminal narrowing, unchanged.   L4-L5: Anterolisthesis with disc unroofing and broad-based disc bulge. Severe left and moderate right facet arthropathy. No spinal canal stenosis. Mild narrowing of the lateral recesses. Mild left neural foraminal narrowing, which appears unchanged.   L5-S1: Disc height loss with broad-based disc osteophyte complex. Mild facet arthropathy. No spinal canal stenosis. Mild-to-moderate bilateral neural foraminal narrowing, unchanged.   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     Electronically Signed   By: Donald Campion M.D.   On: 03/13/2022 19:55    PATIENT SURVEYS:  ABC scale: The Activities-Specific Balance Confidence (ABC)  Scale 0% 10 20 30  40 50 60 70 80 90 100% No confidence<->completely confident  "How confident are you that you will not lose your balance or become unsteady when you . . .   Date tested 09/22/24  Walk around the house 70%  2. Walk up or down stairs 50%  3. Bend over and pick up a slipper from in front of a closet floor 70%  4. Reach for a small can off a shelf at eye level 90%  5. Stand on tip toes and reach for something above your head 80%  6. Stand on a chair and reach for something 20%  7. Sweep the floor 100%  8. Walk outside the house to a car parked in the driveway 100%  9. Get into or out of a car 100%  10. Walk across a parking lot to the mall 100%  11. Walk up or down a ramp 100%  12. Walk in a crowded mall where people rapidly walk past you 80%  13. Are bumped into by people as you walk through the mall 70%  14. Step onto or off of an escalator  while you are holding onto the railing 80%  15. Step onto or off an escalator while holding onto parcels such that you cannot hold onto the railing 70%  16. Walk outside on icy sidewalks 30%  Total: #/16  76%     COGNITION: Overall cognitive status: Within functional limits for tasks assessed     SENSATION: Light touch: Impaired Bottom of feet but does not radiate down from low back      MUSCLE LENGTH: Not tested    Hamstrings: NT Thomas test: NT    POSTURE: No Significant postural limitations  PALPATION: Not performed     LOWER EXTREMITY ROM:  Active ROM Right eval Left eval  Hip flexion    Hip extension    Hip abduction    Hip adduction    Hip internal rotation    Hip external rotation    Knee flexion    Knee extension    Ankle dorsiflexion    Ankle plantarflexion    Ankle inversion    Ankle eversion     (Blank rows = not tested)  LOWER EXTREMITY MMT:  MMT Right eval Left eval  Hip flexion    Hip extension    Hip abduction    Hip adduction    Hip internal rotation    Hip external rotation    Knee flexion    Knee extension    Ankle dorsiflexion    Ankle plantarflexion    Ankle inversion    Ankle eversion     (Blank rows = not tested)  LOWER EXTREMITY SPECIAL TESTS:  None performed    FUNCTIONAL TESTS:  5 times sit to stand: 18 sec   30 seconds chair stand test 7 reps   Dynamic Gait Index: Dynamic Gait Index  Mark the lowest level that applies.   Date Performed   Gait level surface Normal    2. Change in gait speed Normal     3. Gait with horizontal head turns Mild Impairment   4. Gait with vertical head turns Normal    5. Gait and pivot turn Normal   6. Step over obstacle Mild Impairment    7. Step around obstacle Normal    8. Steps   Total score 21/24    Score Interpretation: Score of <19 indicates high risk of falls.  Minimally Clinically  Important Difference (MCID):  =DGI scores of<21/24 = 1.80 points DGI scores of >21/24 =  0.60 points   [place] T, Inbar-Borovsky N, Brozgol M, Giladi N, [place] JM. The Dynamic Gait Index in healthy older adults: the role of stair climbing, fear of falling and gender. Gait Posture. 2009 Feb;29(2):237-41. doi: 10.1016/j.gaitpost.2008.08.013. Epub 2008 Oct 8. PMID: 81154560; PMCID: EFR7290501.  Pardasaney, MYRTIS LOIS Bonus, GEANNIE POUR., et al. (2012). Sensitivity to change and responsiveness of four balance measures for community-dwelling older adults. Physical therapy 92(3): 388-397.  MCTSIB: Condition 1: Avg of 3 trials: 30 sec, Condition 2: Avg of 3 trials: 30 sec, Condition 3: Avg of 3 trials: 30 sec, Condition 4: Avg of 3 trials: 30 sec, and Total Score: 120/120  GAIT: Distance walked: 50  Assistive device utilized: None Level of assistance: Complete Independence Comments: Slow gait with decreased step length.                                                                                                                                 TREATMENT DATE:   09/28/24:  THERAC   : 1,150 ft  -NRPS 2/10 inner groin of right thigh Dynamic Gait Index (DGI): 21/24 (See above)  :  10 meters/ 10.31 sec =  0.97 m/sec    >=1 m/sec [employer] and cross street safely and normal WS <1 m/sec Need Intervention to reduce falls risk  0.12 m/sec improvement represents a statistically significant improvement in gait speed    - 0.8 - 1.3 m/sec - community ambulator - associated with increased independence in self-care  ORTHOSTATIC VITALS   -Sitting  135/72   -Standing 122/64    NMR  10 meters ambulation with horizontal head turns x 6 -min VC to increase speed and frequency of head turns   Standing Forward Lean 2 x 10   -Slight anterior loss of balance      PATIENT EDUCATION:  Education details: Form and technique for  Person educated: Patient  Education method: Programmer, multimedia, Demonstration, Verbal cues, and Handouts Education comprehension: verbalized understanding,  returned demonstration, and verbal cues required  HOME EXERCISE PROGRAM: Access Code: [email or web address] URL: [email or web address] Date: 09/28/2024 Prepared by: Toribio Servant  Program Notes Standing Forward Lean  2 x 10  X 7 days per week  -Have chair in front of you and bed behind you  Exercises - Walking with Head Rotation  - 1 x daily - 7 x weekly - 10 reps - Sit to Stand Without Arm Support  - 3-4 x weekly - 3 sets - 10 reps  ASSESSMENT:  CLINICAL IMPRESSION: Pt presents for initial treatment after eval for imbalance. Despite having deficits with dynamic balance, pt is above the cut-off for falls risk. She does have decreased balance with vestibular distortion or horizontal head turns. PT focused on dynamic balance exercise to target this deficit and pt was able to perform safely with UE support available and wall nearby to  keep from falling. Orthostatic vitals ruled out during visit and she was not limited during test due to pain. She will continue to benefit from skilled PT to improve LE strength and endurance and potentially other deficits to decrease her risk of fallling.    OBJECTIVE IMPAIRMENTS: Abnormal gait, decreased balance, decreased endurance, decreased mobility, difficulty walking, decreased strength, hypomobility, impaired flexibility, impaired sensation, and pain.   ACTIVITY LIMITATIONS: carrying, lifting, bending, squatting, stairs, dressing, and locomotion level  PARTICIPATION LIMITATIONS: cleaning, shopping, community activity, yard work, and school  PERSONAL FACTORS: Age, Fitness, and 3+ comorbidities: Depreession, HLD, HTN  are also affecting patient's functional outcome.   REHAB POTENTIAL: Good  CLINICAL DECISION MAKING: Stable/uncomplicated  EVALUATION COMPLEXITY: Low   GOALS: Goals reviewed with patient? No  SHORT TERM GOALS: Target date: 10/06/2024   Patient will demonstrate undestanding of home exercise plan by performing exercises  correctly with evidence of good carry over with min to no verbal or tactile cues .   Baseline: NT  09/28/24: Peforming exercises independently   Goal status: ACHIEVED     2.  Patient will show decreased risk of falling as evidence by decrease in self-selected gait speed by >= 0.12 m/sec.  Baseline: 0.97 m/sec Goal status: ONGOING    3.  Pt will decrease 5TSTS by at least 3 seconds to demonstrate a minimal clinically significant improvement in LE strength.  Baseline: 18 sec   Goal status: ONGOING    4. Patient will improve there distance walked by >=201 ft during as evidence of improved lumbar and right knee function and to return to walking longer distances for community level activities without being limited by pain and discomfort Debora et al, 2005).    Baseline: 1,150 ft   Goal status: ONGOING   LONG TERM GOALS: Target date: 12/14/2025   Patient will improve activities specific balance scale score >=67% as evidence of an improvement self-perception of function and to decrease risk for falls Mae et al., 2022).  Baseline: 76% Goal status: Deferred    2.  Patient will perform in >=1.0 m./sec as evidence of improved mobility, decreased falls risk, and overall functional independence.  Baseline: 0.97 m/sec Goal status: ONGOING    3.  Patient will demonstrate reduced falls risk as evidenced by Dynamic Gait Index (DGI) >19/24 to decrease risk of falling.  Baseline: 21/24 Goal status: DEFERRED  4.  Patient will ambulate >=1000 ft in to show community ambulator status to be able to have aerobic endurance to return to fulfilling walking tasks at his job.  Baseline: 1,150 ft     Goal status: ONGOING      PLAN:  PT FREQUENCY: 1-2x/week  PT DURATION: 12 weeks  PLANNED INTERVENTIONS: 97164- PT Re-evaluation, 97750- Physical Performance Testing, 97110-Therapeutic exercises, 97530- Therapeutic activity, [id number]- Neuromuscular re-education, 97535- Self Care, [id number]-  Manual therapy, [id number]- Gait training, [phone]- Canalith repositioning, [id number]- Aquatic Therapy, [phone]- Electrical stimulation (unattended), [phone]- Electrical stimulation (manual), [id number]- Traction (mechanical), 20560 (1-2 muscles), 20561 (3+ muscles)- Dry Needling, Patient/Family education, Balance training, Stair training, Taping, Joint mobilization, Joint manipulation, Spinal manipulation, Spinal mobilization, Vestibular training, DME instructions, Cryotherapy, and Moist heat  PLAN FOR NEXT SESSION: Work on LE strengthening on [employer]. Dynamic balance with vestibular.       Toribio Servant PT, DPT  [facility] [address], [place], [address] Phone: [phone]   Fax:  [phone]

## 2024-10-04 ENCOUNTER — Encounter: Payer: Self-pay | Admitting: Physical Therapy

## 2024-10-04 ENCOUNTER — Ambulatory Visit: Admitting: Physical Therapy

## 2024-10-04 DIAGNOSIS — R200 Anesthesia of skin: Secondary | ICD-10-CM | POA: Diagnosis not present

## 2024-10-04 DIAGNOSIS — R269 Unspecified abnormalities of gait and mobility: Secondary | ICD-10-CM | POA: Diagnosis not present

## 2024-10-04 DIAGNOSIS — R2681 Unsteadiness on feet: Secondary | ICD-10-CM

## 2024-10-04 NOTE — Therapy (Signed)
 OUTPATIENT PHYSICAL THERAPY LOWER EXTREMITY TREATMENT    Patient Name: Molly Faulkner MRN: [id number] DOB:November 20, 1941, 83 y.o., female Today's Date: 10/04/2024  END OF SESSION:  PT End of Session - 10/04/24 1654     Visit Number 3    Number of Visits 24    Date for Recertification  12/01/24    Authorization Type [employer] 2025    Authorization Time Period 20 VISITS 10/18-12/21    Authorization - Number of Visits 3    Progress Note Due on Visit 10    PT Start Time 1645    PT Stop Time 1730    PT Time Calculation (min) 45 min    Activity Tolerance Patient tolerated treatment well    Behavior During Therapy WFL for tasks assessed/performed           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   Sleep apnea    H/O   NO LONGER   Syncope    Past Surgical History:  Procedure Laterality Date   ABDOMINAL HYSTERECTOMY  1994   total   BREAST BIOPSY Bilateral 8018,8016   BREAST EXCISIONAL BIOPSY     CARPAL TUNNEL RELEASE Bilateral    CATARACT EXTRACTION W/PHACO Right 12/02/2017   Procedure: CATARACT EXTRACTION PHACO AND INTRAOCULAR LENS PLACEMENT (IOC);  Surgeon: Jaye Fallow, MD;  Location: [facility] ORS;  Service: Ophthalmology;  Laterality: Right;  US  00:36 AP% 11.5 CDE 4.21 Fluid pack lot # [id number]   CATARACT EXTRACTION W/PHACO Left 12/30/2017   Procedure: CATARACT EXTRACTION PHACO AND INTRAOCULAR LENS PLACEMENT (IOC);  Surgeon: Jaye Fallow, MD;  Location: [facility];  Service: Ophthalmology;  Laterality: Left;  US  00:58 AP% 11.6 CDE 6.80 Fluid pack lot # [id number]   COLONOSCOPY WITH  PROPOFOL  N/A 06/30/2018   Procedure: COLONOSCOPY WITH PROPOFOL ;  Surgeon: Therisa Bi, MD;  Location: [facility] ENDOSCOPY;  Service: Gastroenterology;  Laterality: N/A;   COLONOSCOPY WITH PROPOFOL  N/A 01/07/2022   Procedure: COLONOSCOPY WITH PROPOFOL ;  Surgeon: Unk Corinn Skiff, MD;  Location: [facility] ENDOSCOPY;  Service: Gastroenterology;  Laterality: N/A;   DILATION AND CURETTAGE OF UTERUS     X 2   KNEE ARTHROSCOPY     Patient Active Problem List   Diagnosis Date Noted   Aortic atherosclerosis 04/05/2024   Peripheral neuropathy 04/05/2024   Class 1 obesity due to excess calories with body mass index (BMI) of 32.0 to 32.9 in adult 09/05/2022   Osteoarthritis of lower back 05/29/2018   OSA on CPAP 07/01/2016   Hyperlipidemia 02/21/2015   Insomnia 07/25/2014   HTN (hypertension) 07/25/2014   GERD 07/25/2014   Depression 07/25/2014   Asthma, moderate persistent, well-controlled 07/25/2014    PCP: Dr. Angeline Laura     REFERRING PROVIDER: Dr. Eduard Haggard    REFERRING DIAG:  Diagnosis  R20.0 (ICD-10-CM) - Numbness in feet  R26.9 (ICD-10-CM) - Gait difficulty    THERAPY DIAG:  Unsteadiness on feet  Rationale for Evaluation and Treatment: Rehabilitation  ONSET DATE: >5  years ago    SUBJECTIVE:   SUBJECTIVE STATEMENT: Pt reports increased knee and low back pain that are limiting her form performing sit to stand exercises. Otherwise, she was able to perform all balance exercises without being limited by pain.    PERTINENT HISTORY: Per Dr. 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 Yes: NPRS scale: 1/10   Pain location: Groin of right hip   Pain description: Achy and sometimes sharp   Aggravating factors: Walking  Relieving factors: Sitting  still    PRECAUTIONS: Fall  RED FLAGS: None   WEIGHT BEARING RESTRICTIONS: No  FALLS:  Has patient fallen in last 6 months? No  LIVING ENVIRONMENT: Lives with: lives alone Lives in: House/apartment Stairs: No Has following equipment at home: Single point cane  OCCUPATION: Retired   PLOF: Independent  PATIENT GOALS: She wants to improve her balance.    NEXT MD VISIT: Did not ask    OBJECTIVE:  Note: Objective measures were completed at Evaluation unless otherwise noted.  VITALS  BP 112/52  HR 66 SpO2 100%  DIAGNOSTIC FINDINGS: CLINICAL DATA:  Low back pain, radiating into bilateral hips and feet   EXAM: MRI LUMBAR SPINE WITHOUT CONTRAST   TECHNIQUE: Multiplanar, multisequence MR imaging of the lumbar spine was performed. No intravenous contrast was administered.   COMPARISON:  09/23/2019   FINDINGS: Segmentation:  Standard.   Alignment: 3 mm retrolisthesis L2 on L3, unchanged. 4 mm anterolisthesis L4 on L5, unchanged. Levocurvature of the lumbar spine.   Vertebrae:  No acute fracture or suspicious osseous lesion.   Conus medullaris and cauda equina: Conus extends to the L1 level. Conus and cauda equina appear normal.   Paraspinal and other soft tissues: Atrophy of the inferior paraspinous muscles.   Disc levels:   T12-L1: Disc height loss and mild disc bulge. Mild facet arthropathy. No spinal canal stenosis or neural foraminal narrowing.   L1-L2: Disc height loss and disc osteophyte complex. Mild facet arthropathy. Narrowing of the lateral recesses. Mild spinal canal stenosis and mild bilateral neural foraminal narrowing, unchanged.   L2-L3: Mild retrolisthesis. Disc osteophyte complex. Mild facet arthropathy. Narrowing of the lateral recesses. Mild spinal canal stenosis. Mild-to-moderate bilateral neural foraminal narrowing, unchanged.   L3-L4: Broad-based disc bulge. Mild facet arthropathy. No spinal canal stenosis. Mild bilateral neural  foraminal narrowing, unchanged.   L4-L5: Anterolisthesis with disc unroofing and broad-based disc bulge. Severe left and moderate right facet arthropathy. No spinal canal stenosis. Mild narrowing of the lateral recesses. Mild left neural foraminal narrowing, which appears unchanged.   L5-S1: Disc height loss with broad-based disc osteophyte complex. Mild facet arthropathy. No spinal canal stenosis. Mild-to-moderate bilateral neural foraminal narrowing, unchanged.   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     Electronically Signed   By: Donald Campion M.D.   On: 03/13/2022 19:55    PATIENT SURVEYS:  ABC scale: The Activities-Specific Balance Confidence (ABC) Scale 0% 10 20 30  40 50 60 70 80 90 100% No confidence<->completely confident  "How confident are you that you will not lose your balance  or become unsteady when you . . .   Date tested 09/22/24  Walk around the house 70%  2. Walk up or down stairs 50%  3. Bend over and pick up a slipper from in front of a closet floor 70%  4. Reach for a small can off a shelf at eye level 90%  5. Stand on tip toes and reach for something above your head 80%  6. Stand on a chair and reach for something 20%  7. Sweep the floor 100%  8. Walk outside the house to a car parked in the driveway 100%  9. Get into or out of a car 100%  10. Walk across a parking lot to the mall 100%  11. Walk up or down a ramp 100%  12. Walk in a crowded mall where people rapidly walk past you 80%  13. Are bumped into by people as you walk through the mall 70%  14. Step onto or off of an escalator while you are holding onto the railing 80%  15. Step onto or off an escalator while holding onto parcels such that you cannot hold onto the  railing 70%  16. Walk outside on icy sidewalks 30%  Total: #/16  76%     COGNITION: Overall cognitive status: Within functional limits for tasks assessed     SENSATION: Light touch: Impaired Bottom of feet but does not radiate down from low back      MUSCLE LENGTH: Not tested    Hamstrings: NT Thomas test: NT    POSTURE: No Significant postural limitations  PALPATION: Not performed     LOWER EXTREMITY ROM:  Active ROM Right eval Left eval  Hip flexion    Hip extension    Hip abduction    Hip adduction    Hip internal rotation    Hip external rotation    Knee flexion    Knee extension    Ankle dorsiflexion    Ankle plantarflexion    Ankle inversion    Ankle eversion     (Blank rows = not tested)  LOWER EXTREMITY MMT:  MMT Right eval Left eval  Hip flexion    Hip extension    Hip abduction    Hip adduction    Hip internal rotation    Hip external rotation    Knee flexion    Knee extension    Ankle dorsiflexion    Ankle plantarflexion    Ankle inversion    Ankle eversion     (Blank rows = not tested)  LOWER EXTREMITY SPECIAL TESTS:  None performed    FUNCTIONAL TESTS:  5 times sit to stand: 18 sec   30 seconds chair stand test 7 reps   Dynamic Gait Index: Dynamic Gait Index  Mark the lowest level that applies.   Date Performed   Gait level surface Normal    2. Change in gait speed Normal     3. Gait with horizontal head turns Mild Impairment   4. Gait with vertical head turns Normal    5. Gait and pivot turn Normal   6. Step over obstacle Mild Impairment    7. Step around obstacle Normal    8. Steps   Total score 21/24    Score Interpretation: Score of <19 indicates high risk of falls.  Minimally Clinically Important Difference (MCID):  =DGI scores of<21/24 = 1.80 points DGI scores of >21/24 = 0.60 points   [place] T, Inbar-Borovsky N, Brozgol M, Giladi N,  Hausdorff JM. The Dynamic Gait Index in healthy older adults: the role of  stair climbing, fear of falling and gender. Gait Posture. 2009 Feb;29(2):237-41. doi: 10.1016/j.gaitpost.2008.08.013. Epub 2008 Oct 8. PMID: 81154560; PMCID: EFR7290501.  Pardasaney, MYRTIS LOIS Bonus, GEANNIE POUR., et al. (2012). Sensitivity to change and responsiveness of four balance measures for community-dwelling older adults. Physical therapy 92(3): 388-397.  MCTSIB: Condition 1: Avg of 3 trials: 30 sec, Condition 2: Avg of 3 trials: 30 sec, Condition 3: Avg of 3 trials: 30 sec, Condition 4: Avg of 3 trials: 30 sec, and Total Score: 120/120  GAIT: Distance walked: 50  Assistive device utilized: None Level of assistance: Complete Independence Comments: Slow gait with decreased step length.                                                                                                                                 TREATMENT DATE:   10/04/24 :  THEREX   Standing Hip Extension with BUE support using TM  railing 1 x 10   -Pt shows decreased hip extension.    Modified Hip Extension with forward lean on elbows 1 x 10   Modified Hip Extension with forward lean on elbows and use of red band for increased resistance 2 x 10   -min VC to stand upright when extending LLE   Mini-Squat with arms flexed to 90 degrees and held isometrically 1 x 10   -min VC to keep knees behind toes    OMEGA Knee Extension #15 3 x 10    OMEGA HS Curl #15 3 x 10  -min VC to breath with knee flexion and out with knee extension.   NMR  Forward Lumbar Flexion and Extension with forward bows 1 x 10   Forward Lumbar Flexion and Extension with forward bows with eyes closed 1 x 10   Forward Lumbar Flexion and Extension with forward bows on foams 1 x 10     PATIENT EDUCATION:  Education details: Form and technique for  Person educated: Patient  Education method: Programmer, multimedia, Facilities manager, Verbal cues, and Handouts Education comprehension: verbalized understanding, returned demonstration, and verbal cues required  HOME  EXERCISE PROGRAM: Access Code: [email or web address] URL: [email or web address] Date: 10/04/2024 Prepared by: Toribio Servant  Program Notes Standing Forward Lean with eyes closed  2 x 10  X 7 days per week  -Have chair in front of you and bed behind you   Exercises - Walking with Head Rotation  - 1 x daily - 7 x weekly - 10 reps - Sit to Stand Without Arm Support  - 3-4 x weekly - 3 sets - 10 reps - Mini Squat  - 3-4 x weekly - 3 sets - 10 reps - Standing Supported Hip Extension at Counter  - 3-4 x weekly - 3 sets - 10 reps  ASSESSMENT:  CLINICAL IMPRESSION: Pt shows ongoing improvement with LE strength and static balance with ability to perform progressed  exercises with increased resistance and with decreased sensory input. PT focused on strengthening and balance exercises to improve pt's ability to transfer without losing her balance and with increased stability.  She will continue to benefit from skilled PT to improve LE strength and endurance and potentially other deficits to decrease her risk of fallling.   OBJECTIVE IMPAIRMENTS: Abnormal gait, decreased balance, decreased endurance, decreased mobility, difficulty walking, decreased strength, hypomobility, impaired flexibility, impaired sensation, and pain.   ACTIVITY LIMITATIONS: carrying, lifting, bending, squatting, stairs, dressing, and locomotion level  PARTICIPATION LIMITATIONS: cleaning, shopping, community activity, yard work, and school  PERSONAL FACTORS: Age, Fitness, and 3+ comorbidities: Depreession, HLD, HTN  are also affecting patient's functional outcome.   REHAB POTENTIAL: Good  CLINICAL DECISION MAKING: Stable/uncomplicated  EVALUATION COMPLEXITY: Low   GOALS: Goals reviewed with patient? No  SHORT TERM GOALS: Target date: 10/06/2024   Patient will demonstrate undestanding of home exercise plan by performing exercises correctly with evidence of good carry over with min to no verbal or tactile cues .    Baseline: NT  09/28/24: Peforming exercises independently   Goal status: ACHIEVED     2.  Patient will show decreased risk of falling as evidence by decrease in self-selected gait speed by >= 0.12 m/sec.  Baseline: 0.97 m/sec Goal status: ONGOING    3.  Pt will decrease 5TSTS by at least 3 seconds to demonstrate a minimal clinically significant improvement in LE strength.  Baseline: 18 sec   Goal status: ONGOING    4. Patient will improve there distance walked by >=201 ft during as evidence of improved lumbar and right knee function and to return to walking longer distances for community level activities without being limited by pain and discomfort Debora et al, 2005).    Baseline: 1,150 ft   Goal status: ONGOING   LONG TERM GOALS: Target date: 12/14/2025   Patient will improve activities specific balance scale score >=67% as evidence of an improvement self-perception of function and to decrease risk for falls Mae et al., 2022).  Baseline: 76% Goal status: Deferred    2.  Patient will perform in >=1.0 m./sec as evidence of improved mobility, decreased falls risk, and overall functional independence.  Baseline: 0.97 m/sec Goal status: ONGOING    3.  Patient will demonstrate reduced falls risk as evidenced by Dynamic Gait Index (DGI) >19/24 to decrease risk of falling.  Baseline: 21/24 Goal status: DEFERRED  4.  Patient will ambulate >=1000 ft in to show community ambulator status to be able to have aerobic endurance to return to fulfilling walking tasks at his job.  Baseline: 1,150 ft     Goal status: ONGOING      PLAN:  PT FREQUENCY: 1-2x/week  PT DURATION: 12 weeks  PLANNED INTERVENTIONS: [id number]- PT Re-evaluation, 97750- Physical Performance Testing, 97110-Therapeutic exercises, 97530- Therapeutic activity, [id number]- Neuromuscular re-education, 97535- Self Care, [id number]- Manual therapy, [id number]- Gait training, [phone]- Canalith repositioning, [id number]- Aquatic  Therapy, [phone]- Electrical stimulation (unattended), [phone]- Electrical stimulation (manual), [id number]- Traction (mechanical), 20560 (1-2 muscles), 20561 (3+ muscles)- Dry Needling, Patient/Family education, Balance training, Stair training, Taping, Joint mobilization, Joint manipulation, Spinal manipulation, Spinal mobilization, Vestibular training, DME instructions, Cryotherapy, and Moist heat  PLAN FOR NEXT SESSION: LEG Press on OMEGA and walking with head turns and stepping over obstacles.   Toribio Servant PT, DPT  [facility] [address] [place], [address] Phone: [phone]   Fax:  [phone]

## 2024-10-06 ENCOUNTER — Ambulatory Visit: Admitting: Physical Therapy

## 2024-10-06 DIAGNOSIS — R2681 Unsteadiness on feet: Secondary | ICD-10-CM | POA: Diagnosis not present

## 2024-10-06 DIAGNOSIS — R200 Anesthesia of skin: Secondary | ICD-10-CM | POA: Diagnosis not present

## 2024-10-06 DIAGNOSIS — R269 Unspecified abnormalities of gait and mobility: Secondary | ICD-10-CM | POA: Diagnosis not present

## 2024-10-06 NOTE — Therapy (Addendum)
 OUTPATIENT PHYSICAL THERAPY LOWER EXTREMITY TREATMENT    Patient Name: Molly Faulkner MRN: [id number] DOB:11/25/1941, 83 y.o., female Today's Date: 10/06/2024  END OF SESSION:  PT End of Session - 10/06/24 1606     Visit Number 4    Number of Visits 24    Date for Recertification  12/01/24    Authorization Type [employer] 2025    Authorization Time Period 20 VISITS 10/18-12/21    Authorization - Visit Number 4    Authorization - Number of Visits 4    Progress Note Due on Visit 10    PT Start Time 1603    PT Stop Time 1645    PT Time Calculation (min) 42 min    Activity Tolerance Patient tolerated treatment well    Behavior During Therapy WFL for tasks assessed/performed           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   Sleep apnea    H/O   NO LONGER   Syncope    Past Surgical History:  Procedure Laterality Date   ABDOMINAL HYSTERECTOMY  1994   total   BREAST BIOPSY Bilateral 8018,8016   BREAST EXCISIONAL BIOPSY     CARPAL TUNNEL RELEASE Bilateral    CATARACT EXTRACTION W/PHACO Right 12/02/2017   Procedure: CATARACT EXTRACTION PHACO AND INTRAOCULAR LENS PLACEMENT (IOC);  Surgeon: Jaye Fallow, MD;  Location: [facility] ORS;  Service: Ophthalmology;  Laterality: Right;  US  00:36 AP% 11.5 CDE 4.21 Fluid pack lot # [id number]   CATARACT EXTRACTION W/PHACO Left 12/30/2017   Procedure: CATARACT EXTRACTION PHACO AND INTRAOCULAR LENS PLACEMENT (IOC);  Surgeon: Jaye Fallow, MD;  Location: [facility];  Service: Ophthalmology;  Laterality: Left;  US  00:58 AP% 11.6 CDE 6.80 Fluid pack  lot # [id number]   COLONOSCOPY WITH PROPOFOL  N/A 06/30/2018   Procedure: COLONOSCOPY WITH PROPOFOL ;  Surgeon: Therisa Bi, MD;  Location: [facility] ENDOSCOPY;  Service: Gastroenterology;  Laterality: N/A;   COLONOSCOPY WITH PROPOFOL  N/A 01/07/2022   Procedure: COLONOSCOPY WITH PROPOFOL ;  Surgeon: Unk Corinn Skiff, MD;  Location: [facility] ENDOSCOPY;  Service: Gastroenterology;  Laterality: N/A;   DILATION AND CURETTAGE OF UTERUS     X 2   KNEE ARTHROSCOPY     Patient Active Problem List   Diagnosis Date Noted   Aortic atherosclerosis 04/05/2024   Peripheral neuropathy 04/05/2024   Class 1 obesity due to excess calories with body mass index (BMI) of 32.0 to 32.9 in adult 09/05/2022   Osteoarthritis of lower back 05/29/2018   OSA on CPAP 07/01/2016   Hyperlipidemia 02/21/2015   Insomnia 07/25/2014   HTN (hypertension) 07/25/2014   GERD 07/25/2014   Depression 07/25/2014   Asthma, moderate persistent, well-controlled 07/25/2014    PCP: Dr. Angeline Laura     REFERRING PROVIDER: Dr. Eduard Haggard    REFERRING DIAG:  Diagnosis  R20.0 (ICD-10-CM) - Numbness in feet  R26.9 (ICD-10-CM) - Gait difficulty    THERAPY DIAG:  Unsteadiness on feet  Rationale for  Evaluation and Treatment: Rehabilitation  ONSET DATE: >5 years ago    SUBJECTIVE:   SUBJECTIVE STATEMENT: Pt reports increased pain in low back and right knee at start of visit, but pain has not been stopping her from completing exercises.    PERTINENT HISTORY: Per Dr. 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 Yes: NPRS scale: 1/10   Pain location: Groin of right hip   Pain description: Achy and sometimes sharp   Aggravating factors: Walking  Relieving factors: Sitting still    PRECAUTIONS:  Fall  RED FLAGS: None   WEIGHT BEARING RESTRICTIONS: No  FALLS:  Has patient fallen in last 6 months? No  LIVING ENVIRONMENT: Lives with: lives alone Lives in: House/apartment Stairs: No Has following equipment at home: Single point cane  OCCUPATION: Retired   PLOF: Independent  PATIENT GOALS: She wants to improve her balance.    NEXT MD VISIT: Did not ask    OBJECTIVE:  Note: Objective measures were completed at Evaluation unless otherwise noted.  VITALS  BP 112/52  HR 66 SpO2 100%  DIAGNOSTIC FINDINGS: CLINICAL DATA:  Low back pain, radiating into bilateral hips and feet   EXAM: MRI LUMBAR SPINE WITHOUT CONTRAST   TECHNIQUE: Multiplanar, multisequence MR imaging of the lumbar spine was performed. No intravenous contrast was administered.   COMPARISON:  09/23/2019   FINDINGS: Segmentation:  Standard.   Alignment: 3 mm retrolisthesis L2 on L3, unchanged. 4 mm anterolisthesis L4 on L5, unchanged. Levocurvature of the lumbar spine.   Vertebrae:  No acute fracture or suspicious osseous lesion.   Conus medullaris and cauda equina: Conus extends to the L1 level. Conus and cauda equina appear normal.   Paraspinal and other soft tissues: Atrophy of the inferior paraspinous muscles.   Disc levels:   T12-L1: Disc height loss and mild disc bulge. Mild facet arthropathy. No spinal canal stenosis or neural foraminal narrowing.   L1-L2: Disc height loss and disc osteophyte complex. Mild facet arthropathy. Narrowing of the lateral recesses. Mild spinal canal stenosis and mild bilateral neural foraminal narrowing, unchanged.   L2-L3: Mild retrolisthesis. Disc osteophyte complex. Mild facet arthropathy. Narrowing of the lateral recesses. Mild spinal canal stenosis. Mild-to-moderate bilateral neural foraminal narrowing, unchanged.   L3-L4: Broad-based disc bulge. Mild facet arthropathy. No spinal canal stenosis. Mild bilateral neural foraminal  narrowing, unchanged.   L4-L5: Anterolisthesis with disc unroofing and broad-based disc bulge. Severe left and moderate right facet arthropathy. No spinal canal stenosis. Mild narrowing of the lateral recesses. Mild left neural foraminal narrowing, which appears unchanged.   L5-S1: Disc height loss with broad-based disc osteophyte complex. Mild facet arthropathy. No spinal canal stenosis. Mild-to-moderate bilateral neural foraminal narrowing, unchanged.   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     Electronically Signed   By: Donald Campion M.D.   On: 03/13/2022 19:55    PATIENT SURVEYS:  ABC scale: The Activities-Specific Balance Confidence (ABC) Scale 0% 10 20 30  40 50 60 70 80 90 100% No confidence<->completely confident  "How confident are you that you will not lose your balance  or become unsteady when you . . .   Date tested 09/22/24  Walk around the house 70%  2. Walk up or down stairs 50%  3. Bend over and pick up a slipper from in front of a closet floor 70%  4. Reach for a small can off a shelf at eye level 90%  5. Stand on tip toes and reach for something above your head 80%  6. Stand on a chair and reach for something 20%  7. Sweep the floor 100%  8. Walk outside the house to a car parked in the driveway 100%  9. Get into or out of a car 100%  10. Walk across a parking lot to the mall 100%  11. Walk up or down a ramp 100%  12. Walk in a crowded mall where people rapidly walk past you 80%  13. Are bumped into by people as you walk through the mall 70%  14. Step onto or off of an escalator while you are holding onto the railing 80%  15. Step onto or off an escalator while holding onto parcels such that you cannot hold onto the railing 70%   16. Walk outside on icy sidewalks 30%  Total: #/16  76%     COGNITION: Overall cognitive status: Within functional limits for tasks assessed     SENSATION: Light touch: Impaired Bottom of feet but does not radiate down from low back      MUSCLE LENGTH: Not tested    Hamstrings: NT Thomas test: NT    POSTURE: No Significant postural limitations  PALPATION: Not performed     LOWER EXTREMITY ROM:  Active ROM Right eval Left eval  Hip flexion    Hip extension    Hip abduction    Hip adduction    Hip internal rotation    Hip external rotation    Knee flexion    Knee extension    Ankle dorsiflexion    Ankle plantarflexion    Ankle inversion    Ankle eversion     (Blank rows = not tested)  LOWER EXTREMITY MMT:  MMT Right eval Left eval  Hip flexion    Hip extension    Hip abduction    Hip adduction    Hip internal rotation    Hip external rotation    Knee flexion    Knee extension    Ankle dorsiflexion    Ankle plantarflexion    Ankle inversion    Ankle eversion     (Blank rows = not tested)  LOWER EXTREMITY SPECIAL TESTS:  None performed    FUNCTIONAL TESTS:  5 times sit to stand: 18 sec   30 seconds chair stand test 7 reps   Dynamic Gait Index: Dynamic Gait Index  Mark the lowest level that applies.   Date Performed   Gait level surface Normal    2. Change in gait speed Normal     3. Gait with horizontal head turns Mild Impairment   4. Gait with vertical head turns Normal    5. Gait and pivot turn Normal   6. Step over obstacle Mild Impairment    7. Step around obstacle Normal    8. Steps   Total score 21/24    Score Interpretation: Score of <19 indicates high risk of falls.  Minimally Clinically Important Difference (MCID):  =DGI scores of<21/24 = 1.80 points DGI scores of >21/24 = 0.60 points   [place] T, Inbar-Borovsky N, Brozgol M, Giladi N,  Hausdorff JM. The Dynamic Gait Index in healthy older adults: the role of stair climbing,  fear of falling and gender. Gait Posture. 2009 Feb;29(2):237-41. doi: 10.1016/j.gaitpost.2008.08.013. Epub 2008 Oct 8. PMID: 81154560; PMCID: EFR7290501.  Pardasaney, MYRTIS LOIS Bonus, GEANNIE POUR., et al. (2012). Sensitivity to change and responsiveness of four balance measures for community-dwelling older adults. Physical therapy 92(3): 388-397.  MCTSIB: Condition 1: Avg of 3 trials: 30 sec, Condition 2: Avg of 3 trials: 30 sec, Condition 3: Avg of 3 trials: 30 sec, Condition 4: Avg of 3 trials: 30 sec, and Total Score: 120/120  GAIT: Distance walked: 50  Assistive device utilized: None Level of assistance: Complete Independence Comments: Slow gait with decreased step length.                                                                                                                                 TREATMENT DATE:   10/06/24  THEREX  Nu-Step seat and arms at 6 for 3 min    -Pt reports increased right knee pain   Nu-Step seat and arms at 8 for 2 min    -Pt reports decreased right knee pain  Lumbar Flexion and Extension [facility] Left  2 X 10   NMR   Lumbar Flexion and Extension Eyes Closed 1 x 10  -Pt able to reach 90 deg hip flexion Romberg Eyes Open  2 x 30 sec hold  Romberg with horizontal head turns 2 x 10    Romberg with vertical head turns 2 x 10    -Pt reports increased left upper trap pain, she has h/o of cervical arthritis.   Romberg with eyes closed 2 x 30 sec hold    Romberg with eyes closed and horizontal head turns 2 x 10   Romberg on foam 2 x 30 sec    Romberg on foam with horizontal head turns 2 x 10    Romberg on foam with eyes closed  2 x 30 sec    Romberg on foram with eyes closed and horizontal head turns 2 x 10 -mild sway, with ankle and hip strategy     Semi-tandem 2 x 30 sec   Semi-tandem with horizontal head turns 2 x 10    -pt reports increased soreness in her low back    10 meters with 3.5 inch obstacle step over  x 10  -Pt needs to slow down before step over   10 meters with half foam roller step over 1 x 10   -min VC to keep constant speed when stepping over   10 meters with water cup hold x 4  -min VC to hold steady so it does not spill when walking        PATIENT EDUCATION:  Education details: Form and technique for  Person educated: Patient  Education method: Explanation, Demonstration, Verbal cues, and Handouts Education comprehension: verbalized understanding, returned  demonstration, and verbal cues required  HOME EXERCISE PROGRAM: Access Code: [email or web address] URL: [email or web address] Date: 10/04/2024 Prepared by: Toribio Servant  Program Notes Standing Forward Lean with eyes closed  2 x 10  X 7 days per week  -Have chair in front of you and bed behind you   Exercises - Walking with Head Rotation  - 1 x daily - 7 x weekly - 10 reps - Sit to Stand Without Arm Support  - 3-4 x weekly - 3 sets - 10 reps - Mini Squat  - 3-4 x weekly - 3 sets - 10 reps - Standing Supported Hip Extension at Counter  - 3-4 x weekly - 3 sets - 10 reps  ASSESSMENT:  CLINICAL IMPRESSION: Pt continues to show improvement in dynamic and static balance with ability to perform increasingly difficult balance exercises without loss of balance. She was somewhat limited by right knee pain during session especially with static  balance exercises, but she was able to perform all the exercises without needing to stop. PT focused on balance exercises to continue to improve patient's dynamic and static balance to decrease risk of falling. She will continue to benefit from skilled PT to improve LE strength and endurance and potentially other deficits to decrease her risk of fallling.    OBJECTIVE IMPAIRMENTS: Abnormal gait, decreased balance, decreased endurance, decreased mobility, difficulty walking, decreased strength, hypomobility, impaired flexibility, impaired sensation, and pain.   ACTIVITY LIMITATIONS:  carrying, lifting, bending, squatting, stairs, dressing, and locomotion level  PARTICIPATION LIMITATIONS: cleaning, shopping, community activity, yard work, and school  PERSONAL FACTORS: Age, Fitness, and 3+ comorbidities: Depreession, HLD, HTN  are also affecting patient's functional outcome.   REHAB POTENTIAL: Good  CLINICAL DECISION MAKING: Stable/uncomplicated  EVALUATION COMPLEXITY: Low   GOALS: Goals reviewed with patient? No  SHORT TERM GOALS: Target date: 10/06/2024   Patient will demonstrate undestanding of home exercise plan by performing exercises correctly with evidence of good carry over with min to no verbal or tactile cues .   Baseline: NT  09/28/24: Peforming exercises independently   Goal status: ACHIEVED     2.  Patient will show decreased risk of falling as evidence by decrease in self-selected gait speed by >= 0.12 m/sec.  Baseline: 0.97 m/sec Goal status: ONGOING    3.  Pt will decrease 5TSTS by at least 3 seconds to demonstrate a minimal clinically significant improvement in LE strength.  Baseline: 18 sec   Goal status: ONGOING    4. Patient will improve there distance walked by >=201 ft during as evidence of improved lumbar and right knee function and to return to walking longer distances for community level activities without being limited by pain and discomfort Debora et al, 2005).    Baseline: 1,150 ft   Goal status: ONGOING   LONG TERM GOALS: Target date: 12/14/2025   Patient will improve activities specific balance scale score >=67% as evidence of an improvement self-perception of function and to decrease risk for falls Mae et al., 2022).  Baseline: 76% Goal status: Deferred    2.  Patient will perform in >=1.0 m./sec as evidence of improved mobility, decreased falls risk, and overall functional independence.  Baseline: 0.97 m/sec Goal status: ONGOING    3.  Patient will demonstrate reduced falls risk as evidenced by Dynamic  Gait Index (DGI) >19/24 to decrease risk of falling.  Baseline: 21/24 Goal status: DEFERRED  4.  Patient will ambulate >=1000 ft in to show community ambulator status  to be able to have aerobic endurance to return to fulfilling walking tasks at his job.  Baseline: 1,150 ft     Goal status: ONGOING      PLAN:  PT FREQUENCY: 1-2x/week  PT DURATION: 12 weeks  PLANNED INTERVENTIONS: 97164- PT Re-evaluation, 97750- Physical Performance Testing, 97110-Therapeutic exercises, 97530- Therapeutic activity, [id number]- Neuromuscular re-education, 97535- Self Care, [id number]- Manual therapy, [id number]- Gait training, [phone]- Canalith repositioning, [id number]- Aquatic Therapy, [phone]- Electrical stimulation (unattended), [phone]- Electrical stimulation (manual), [id number]- Traction (mechanical), 20560 (1-2 muscles), 20561 (3+ muscles)- Dry Needling, Patient/Family education, Balance training, Stair training, Taping, Joint mobilization, Joint manipulation, Spinal manipulation, Spinal mobilization, Vestibular training, DME instructions, Cryotherapy, and Moist heat  PLAN FOR NEXT SESSION: LEG Press on OMEGA. Figure 8's and 2 obstacle step over. Look at static balance in semi-tandem with eyes closed.       Toribio Servant PT, DPT  [facility] [address], [place], [address] Phone: [phone]   Fax:  [phone]

## 2024-10-07 ENCOUNTER — Encounter: Payer: Self-pay | Admitting: Internal Medicine

## 2024-10-07 ENCOUNTER — Ambulatory Visit (INDEPENDENT_AMBULATORY_CARE_PROVIDER_SITE_OTHER): Admitting: Internal Medicine

## 2024-10-07 VITALS — BP 110/64 | HR 60 | Ht 60.0 in | Wt 169.6 lb

## 2024-10-07 DIAGNOSIS — E782 Mixed hyperlipidemia: Secondary | ICD-10-CM | POA: Diagnosis not present

## 2024-10-07 DIAGNOSIS — Z6833 Body mass index (BMI) 33.0-33.9, adult: Secondary | ICD-10-CM | POA: Diagnosis not present

## 2024-10-07 DIAGNOSIS — R739 Hyperglycemia, unspecified: Secondary | ICD-10-CM | POA: Diagnosis not present

## 2024-10-07 DIAGNOSIS — E6609 Other obesity due to excess calories: Secondary | ICD-10-CM

## 2024-10-07 DIAGNOSIS — E66811 Obesity, class 1: Secondary | ICD-10-CM | POA: Diagnosis not present

## 2024-10-07 DIAGNOSIS — Z0001 Encounter for general adult medical examination with abnormal findings: Secondary | ICD-10-CM

## 2024-10-07 LAB — HEMOGLOBIN A1C
Hgb A1c MFr Bld: 4.9 % (ref <5.7)
Mean Plasma Glucose: 94 mg/dL
eAG (mmol/L): 5.2 mmol/L

## 2024-10-07 LAB — COMPREHENSIVE METABOLIC PANEL WITH GFR
AG Ratio: 1.6 (calc) (ref 1.0–2.5)
ALT: 19 U/L (ref 6–29)
AST: 19 U/L (ref 10–35)
Albumin: 4.2 g/dL (ref 3.6–5.1)
Alkaline phosphatase (APISO): 65 U/L (ref 37–153)
BUN: 18 mg/dL (ref 7–25)
CO2: 28 mmol/L (ref 20–32)
Calcium: 11 mg/dL — ABNORMAL HIGH (ref 8.6–10.4)
Chloride: 105 mmol/L (ref 98–110)
Creat: 0.73 mg/dL (ref 0.60–0.95)
Globulin: 2.6 g/dL (ref 1.9–3.7)
Glucose, Bld: 91 mg/dL (ref 65–139)
Potassium: 4.2 mmol/L (ref 3.5–5.3)
Sodium: 139 mmol/L (ref 135–146)
Total Bilirubin: 1.2 mg/dL (ref 0.2–1.2)
Total Protein: 6.8 g/dL (ref 6.1–8.1)
eGFR: 83 mL/min/1.73m2 (ref >=60)

## 2024-10-07 LAB — LIPID PANEL
Cholesterol: 142 mg/dL (ref <200)
HDL: 53 mg/dL (ref >=50)
LDL Cholesterol (Calc): 75 mg/dL
Non-HDL Cholesterol (Calc): 89 mg/dL (ref <130)
Total CHOL/HDL Ratio: 2.7 (calc) (ref <5.0)
Triglycerides: 67 mg/dL (ref <150)

## 2024-10-07 LAB — CBC
HCT: 36.7 % (ref 35.0–45.0)
Hemoglobin: 12.3 g/dL (ref 11.7–15.5)
MCH: 32.4 pg (ref 27.0–33.0)
MCHC: 33.5 g/dL (ref 32.0–36.0)
MCV: 96.6 fL (ref 80.0–100.0)
MPV: 11.4 fL (ref 7.5–12.5)
Platelets: 224 Thousand/uL (ref 140–400)
RBC: 3.8 Million/uL (ref 3.80–5.10)
RDW: 12.7 % (ref 11.0–15.0)
WBC: 5.6 Thousand/uL (ref 3.8–10.8)

## 2024-10-07 MED ORDER — LOSARTAN POTASSIUM 50 MG PO TABS: 50.0000 mg | ORAL_TABLET | Freq: Two times a day (BID) | ORAL | 1 refills | Status: DC

## 2024-10-07 MED ORDER — AZELASTINE HCL 0.1 % NA SOLN: 1.0000 | Freq: Two times a day (BID) | NASAL | 5 refills | Status: AC

## 2024-10-07 MED ORDER — BELSOMRA 5 MG PO TABS: 5.0000 mg | ORAL_TABLET | Freq: Every evening | ORAL | 0 refills | Status: DC | PRN

## 2024-10-07 NOTE — Assessment & Plan Note (Signed)
 Encouraged diet and exercise for weight loss ?

## 2024-10-07 NOTE — Progress Notes (Signed)
 Subjective:    Patient ID: Molly Faulkner, female    DOB: March 08, 1941, 83 y.o.   MRN: [id number]  HPI  Patient presents to clinic today for her annual exam.  Flu: 08/2024 Tetanus: 12/2011 COVID: X 3 Pneumovax: 01/2012 Prevnar 13: 02/2016 Zostavax: 12/2011 Shingrix: Never Pap smear: Hysterectomy Mammogram: 09/2023 Bone density: 10/2022 Colon screening: 12/2021, 3 years Vision screening: annually Dentist: biannually  Diet: She does eat some meat. She consumes fruits and veggies. She does eat some fried foods. She drinks mostly water. 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     albuterol  (VENTOLIN  HFA) 108 (90 Base) MCG/ACT inhaler Inhale 2 puffs into the lungs every 4 (four) hours as needed for wheezing or shortness of breath. 8 g 8   aspirin  EC 81 MG tablet Take 1 tablet (81 mg total) by mouth daily. Swallow whole.     atenolol  (TENORMIN ) 25 MG tablet TAKE 1 TABLET EVERY DAY 90 tablet 3   CREAM BASE EX Apply 1-2 application topically 4 (four) times daily as needed (for arthritis pain.). Penetrex Inflammation Formula (Arnica/Pyridoxine/MSM/Boswellia/Cetyl Myristoleate)     cyclobenzaprine  (FLEXERIL ) 5 MG tablet TAKE 1/2 TABLET(2.5 MG) BY MOUTH AT BEDTIME 30 tablet 0   ezetimibe  (ZETIA ) 10 MG tablet Take 1 tablet (10 mg total) by  mouth daily. 90 tablet 3   hydrochlorothiazide  (HYDRODIURIL ) 25 MG tablet Take 1 tablet (25 mg total) by mouth daily. TAKE 1 TABLET EVERY DAY 90 tablet 3   HYDROcodone -acetaminophen  (NORCO/VICODIN) 5-325 MG tablet Take 1 tablet by mouth every 8 (eight) hours as needed for moderate pain. (Patient not taking: Reported on 09/22/2024) 15 tablet 0   hydrOXYzine  (ATARAX ) 10 MG tablet Take 1 tablet (10 mg total) by mouth at bedtime. 90 tablet 0   ibuprofen (ADVIL) 400 MG tablet Take 400 mg by mouth as needed for moderate pain (pain score 4-6). (Patient not taking: Reported on 09/22/2024)     levocetirizine (XYZAL ) 5 MG tablet TAKE 1 TABLET(5 MG) BY MOUTH EVERY EVENING (Patient not taking: Reported on 09/22/2024) 90 tablet 1   losartan  (COZAAR ) 50 MG tablet Take 1 tablet (50 mg total) by mouth 2 (two) times daily. 90 tablet 3   Multiple Vitamins-Minerals (EYE MULTIVITAMIN PO) Take by mouth. [employer]     Suvorexant  (BELSOMRA ) 5 MG TABS Take 1 tablet (5 mg total) by mouth at bedtime as needed. 30 tablet 0   Vitamin D , Ergocalciferol , (DRISDOL ) 1.25 MG (50000 UNIT) CAPS capsule Take 1 capsule (50,000 Units total) by mouth once a week. For 12 weeks. Then start OTC Vitamin D3 2,000 unit daily. 12 capsule 0  No current facility-administered medications for this visit.    Allergies  Allergen Reactions   Avelox  [Moxifloxacin  Hcl In Nacl] Other (See Comments)    Chills   Moxifloxacin      Other Reaction(s): Not available  moxifloxacin    Statins Other (See Comments)    Dark urine and leg weakness   Trazodone  And Nefazodone Swelling   Clarithromycin Other (See Comments)    Unsure of reaction type  Other Reaction(s): Not available  clarithromycin    Family History  Problem Relation Age of Onset   Breast cancer Mother 60   Arthritis Maternal Grandmother    Diabetes Maternal Grandmother     Social History   Socioeconomic History   Marital status: Single    Spouse name: Not on file   Number of  children: Not on file   Years of education: Not on file   Highest education level: Not on file  Occupational History   Not on file  Tobacco Use   Smoking status: Former    Current packs/day: 0.50    Average packs/day: 0.5 packs/day for 7.0 years (3.5 ttl pk-yrs)    Types: Cigarettes   Smokeless tobacco: Never   Tobacco comments:    quit over 40 years ago  Vaping Use   Vaping status: Never Used  Substance and Sexual Activity   Alcohol use: Yes    Alcohol/week: 2.0 standard drinks of alcohol    Types: 2 Shots of liquor per week   Drug use: No   Sexual activity: Not Currently  Other Topics Concern   Not on file  Social History Narrative   Not on file   Social Drivers of Health   Financial Resource Strain: Low Risk  (08/28/2023)   Overall Financial Resource Strain (CARDIA)    Difficulty of Paying Living Expenses: Not very hard  Food Insecurity: No Food Insecurity (08/28/2023)   Hunger Vital Sign    Worried About Running Out of Food in the Last Year: Never true    Ran Out of Food in the Last Year: Never true  Transportation Needs: No Transportation Needs (08/28/2023)   PRAPARE - Administrator, Civil Service (Medical): No    Lack of Transportation (Non-Medical): No  Physical Activity: Insufficiently Active (08/28/2023)   Exercise Vital Sign    Days of Exercise per Week: 2 days    Minutes of Exercise per Session: 20 min  Stress: No Stress Concern Present (08/28/2023)   [employer] of Occupational Health - Occupational Stress Questionnaire    Feeling of Stress : Only a little  Social Connections: Moderately Isolated (08/28/2023)   Social Connection and Isolation Panel    Frequency of Communication with Friends and Family: More than three times a week    Frequency of Social Gatherings with Friends and Family: Never    Attends Religious Services: Never    Database administrator or Organizations: Yes    Attends Engineer, structural: More than 4 times per  year    Marital Status: Divorced  Intimate Partner Violence: Not At Risk (08/28/2023)   Humiliation, Afraid, Rape, and Kick questionnaire    Fear of Current or Ex-Partner: No    Emotionally Abused: No    Physically Abused: No    Sexually Abused: No     Constitutional: Denies fever, malaise, fatigue, headache or abrupt weight changes.  HEENT: Denies eye pain, eye redness, ear pain, ringing in the ears, wax buildup, runny nose, nasal congestion, bloody nose, or sore  throat. Respiratory: Denies difficulty breathing, shortness of breath, cough or sputum production.   Cardiovascular: Denies chest pain, chest tightness, palpitations or swelling in the hands or feet.  Gastrointestinal: Patient reports epigastric pain and gassiness.  Denies bloating, constipation, diarrhea or blood in the stool.  GU: Denies urgency, frequency, pain with urination, burning sensation, blood in urine, odor or discharge. Musculoskeletal: Patient reports joint pain.  Denies decrease in range of motion, difficulty with gait, muscle pain or joint swelling.  Skin: Denies redness, rashes, lesions or ulcercations.  Neurological: Patient reports insomnia, paresthesia of feet.  Denies dizziness, difficulty with memory, difficulty with speech or problems with balance and coordination.  Psych: Patient has a history of depression.  Denies anxiety, SI/HI.  No other specific complaints in a complete review of systems (except as listed in HPI above).  Objective:   Physical Exam BP 110/64 (BP Location: Right Arm, Patient Position: Sitting, Cuff Size: Large)   Pulse 60   Ht 5' (1.524 m)   Wt 169 lb 9.6 oz (76.9 kg)   SpO2 99%   BMI 33.12 kg/m    Wt Readings from Last 3 Encounters:  09/01/24 167 lb 12.8 oz (76.1 kg)  08/24/24 167 lb 9.6 oz (76 kg)  08/02/24 168 lb 2 oz (76.3 kg)    General: Appears her stated age, obese, in NAD. Skin: Warm, dry and intact.  HEENT: Head: normal shape and size; Eyes: sclera white, no  icterus, conjunctiva pink, PERRLA and EOMs intact;  Neck:  Neck supple, trachea midline. No masses, lumps or thyromegaly present.  Cardiovascular: Normal rate and rhythm. S1,S2 noted.  No murmur, rubs or gallops noted. No JVD or BLE edema. No carotid bruits noted. Pulmonary/Chest: Normal effort and positive vesicular breath sounds. No respiratory distress. No wheezes, rales or ronchi noted.  Abdomen: Soft and nontender. Normal bowel sounds.  Musculoskeletal: Strength 5/5 BUE/BLE.  No difficulty with gait.  Neurological: Alert and oriented. Cranial nerves II-XII grossly intact. Coordination normal.  Psychiatric: Mood and affect normal. Behavior is normal. Judgment and thought content normal.    BMET    Component Value Date/Time   NA 140 04/05/2024 1439   NA 140 01/10/2020 0950   K 4.1 04/05/2024 1439   CL 105 04/05/2024 1439   CO2 28 04/05/2024 1439   GLUCOSE 94 04/05/2024 1439   BUN 22 04/05/2024 1439   BUN 15 01/10/2020 0950   CREATININE 0.94 04/05/2024 1439   CALCIUM  10.4 04/05/2024 1439   GFRNONAA 64 01/10/2020 0950   GFRAA 74 01/10/2020 0950    Lipid Panel     Component Value Date/Time   CHOL 191 06/02/2024 1158   TRIG 83 06/02/2024 1158   HDL 61 06/02/2024 1158   CHOLHDL 3.1 06/02/2024 1158   CHOLHDL 3.0 04/05/2024 1439   VLDL 9 03/22/2020 1139   LDLCALC 115 (H) 06/02/2024 1158   LDLCALC 103 (H) 04/05/2024 1439    CBC    Component Value Date/Time   WBC 5.9 04/05/2024 1439   RBC 3.82 04/05/2024 1439   HGB 12.6 04/05/2024 1439   HGB 13.9 01/10/2020 0950   HCT 36.1 04/05/2024 1439   HCT 40.1 01/10/2020 0950   PLT 191 04/05/2024 1439   PLT 227 01/10/2020 0950   MCV 94.5 04/05/2024 1439   MCV 97 01/10/2020 0950   MCH 33.0 04/05/2024 1439   MCHC 34.9 04/05/2024 1439   RDW 12.1 04/05/2024 1439   RDW 12.3 01/10/2020 0950   LYMPHSABS 1.0 01/10/2020 0950  EOSABS 0.3 01/10/2020 0950   BASOSABS 0.0 01/10/2020 0950    Hgb A1C Lab Results  Component Value Date    HGBA1C 5.0 04/05/2024            Assessment & Plan:   Preventative health maintenance:  Flu shot UTD She declines tetanus for financial reasons, advised her if she gets bit or cut to go get this done Encouraged her to get her COVID booster Pneumovax and Prevnar 13 UTD Zostavax UTD Discussed Shingrix vaccine, she will check coverage with her insurance company and schedule visit if she would like to have this done She no longer needs to screen for cervical cancer Mammogram already scheduled Bone density UTD Colon screening UTD Encouraged her to consume a balanced diet and exercise regimen Advised her to see an eye doctor and dentist annually We will check CBC, c-Met, lipid, A1c today  RTC in 6 months, follow-up chronic conditions Angeline Laura, NP

## 2024-10-07 NOTE — Patient Instructions (Signed)
 Health Maintenance for Postmenopausal Women Menopause is a normal process in which your ability to get pregnant comes to an end. This process happens slowly over many months or years, usually between the ages of 76 and 38. Menopause is complete when you have missed your menstrual period for 12 months. It is important to talk with your health care provider about some of the most common conditions that affect women after menopause (postmenopausal women). These include heart disease, cancer, and bone loss (osteoporosis). Adopting a healthy lifestyle and getting preventive care can help to promote your health and wellness. The actions you take can also lower your chances of developing some of these common conditions. What are the signs and symptoms of menopause? During menopause, you may have the following symptoms: Hot flashes. These can be moderate or severe. Night sweats. Decrease in sex drive. Mood swings. Headaches. Tiredness (fatigue). Irritability. Memory problems. Problems falling asleep or staying asleep. Talk with your health care provider about treatment options for your symptoms. Do I need hormone replacement therapy? Hormone replacement therapy is effective in treating symptoms that are caused by menopause, such as hot flashes and night sweats. Hormone replacement carries certain risks, especially as you become older. If you are thinking about using estrogen or estrogen with progestin, discuss the benefits and risks with your health care provider. How can I reduce my risk for heart disease and stroke? The risk of heart disease, heart attack, and stroke increases as you age. One of the causes may be a change in the body's hormones during menopause. This can affect how your body uses dietary fats, triglycerides, and cholesterol. Heart attack and stroke are medical emergencies. There are many things that you can do to help prevent heart disease and stroke. Watch your blood pressure High  blood pressure causes heart disease and increases the risk of stroke. This is more likely to develop in people who have high blood pressure readings or are overweight. Have your blood pressure checked: Every 3-5 years if you are 32-23 years of age. Every year if you are 31 years old or older. Eat a healthy diet  Eat a diet that includes plenty of vegetables, fruits, low-fat dairy products, and lean protein. Do not eat a lot of foods that are high in solid fats, added sugars, or sodium. Get regular exercise Get regular exercise. This is one of the most important things you can do for your health. Most adults should: Try to exercise for at least 150 minutes each week. The exercise should increase your heart rate and make you sweat (moderate-intensity exercise). Try to do strengthening exercises at least twice each week. Do these in addition to the moderate-intensity exercise. Spend less time sitting. Even light physical activity can be beneficial. Other tips Work with your health care provider to achieve or maintain a healthy weight. Do not use any products that contain nicotine or tobacco. These products include cigarettes, chewing tobacco, and vaping devices, such as e-cigarettes. If you need help quitting, ask your health care provider. Know your numbers. Ask your health care provider to check your cholesterol and your blood sugar (glucose). Continue to have your blood tested as directed by your health care provider. Do I need screening for cancer? Depending on your health history and family history, you may need to have cancer screenings at different stages of your life. This may include screening for: Breast cancer. Cervical cancer. Lung cancer. Colorectal cancer. What is my risk for osteoporosis? After menopause, you may be  at increased risk for osteoporosis. Osteoporosis is a condition in which bone destruction happens more quickly than new bone creation. To help prevent osteoporosis or  the bone fractures that can happen because of osteoporosis, you may take the following actions: If you are 24-54 years old, get at least 1,000 mg of calcium and at least 600 international units (IU) of vitamin D  per day. If you are older than age 75 but younger than age 30, get at least 1,200 mg of calcium and at least 600 international units (IU) of vitamin D  per day. If you are older than age 8, get at least 1,200 mg of calcium and at least 800 international units (IU) of vitamin D  per day. Smoking and drinking excessive alcohol increase the risk of osteoporosis. Eat foods that are rich in calcium and vitamin D , and do weight-bearing exercises several times each week as directed by your health care provider. How does menopause affect my mental health? Depression may occur at any age, but it is more common as you become older. Common symptoms of depression include: Feeling depressed. Changes in sleep patterns. Changes in appetite or eating patterns. Feeling an overall lack of motivation or enjoyment of activities that you previously enjoyed. Frequent crying spells. Talk with your health care provider if you think that you are experiencing any of these symptoms. General instructions See your health care provider for regular wellness exams and vaccines. This may include: Scheduling regular health, dental, and eye exams. Getting and maintaining your vaccines. These include: Influenza vaccine. Get this vaccine each year before the flu season begins. Pneumonia vaccine. Shingles vaccine. Tetanus, diphtheria, and pertussis (Tdap) booster vaccine. Your health care provider may also recommend other immunizations. Tell your health care provider if you have ever been abused or do not feel safe at home. Summary Menopause is a normal process in which your ability to get pregnant comes to an end. This condition causes hot flashes, night sweats, decreased interest in sex, mood swings, headaches, or lack  of sleep. Treatment for this condition may include hormone replacement therapy. Take actions to keep yourself healthy, including exercising regularly, eating a healthy diet, watching your weight, and checking your blood pressure and blood sugar levels. Get screened for cancer and depression. Make sure that you are up to date with all your vaccines. This information is not intended to replace advice given to you by your health care provider. Make sure you discuss any questions you have with your health care provider. Document Revised: 04/23/2021 Document Reviewed: 04/23/2021 Elsevier Patient Education  2024 [employer].

## 2024-10-08 ENCOUNTER — Ambulatory Visit: Payer: Self-pay | Admitting: Internal Medicine

## 2024-10-12 ENCOUNTER — Ambulatory Visit: Admitting: Physical Therapy

## 2024-10-20 ENCOUNTER — Ambulatory Visit: Admitting: Physical Therapy

## 2024-10-25 ENCOUNTER — Ambulatory Visit

## 2024-10-27 ENCOUNTER — Ambulatory Visit: Admitting: Physical Therapy

## 2024-11-01 ENCOUNTER — Ambulatory Visit
Admission: RE | Admit: 2024-11-01 | Discharge: 2024-11-01 | Disposition: A | Discharge status: Home / Self Care | Source: Ambulatory Visit | Attending: Internal Medicine | Admitting: Internal Medicine

## 2024-11-01 ENCOUNTER — Ambulatory Visit: Admitting: Physical Therapy

## 2024-11-01 DIAGNOSIS — Z1231 Encounter for screening mammogram for malignant neoplasm of breast: Secondary | ICD-10-CM | POA: Diagnosis not present

## 2024-11-02 ENCOUNTER — Other Ambulatory Visit: Payer: Self-pay | Admitting: Cardiovascular Disease

## 2024-11-02 DIAGNOSIS — I10 Essential (primary) hypertension: Secondary | ICD-10-CM

## 2024-11-03 ENCOUNTER — Ambulatory Visit: Admitting: Physical Therapy

## 2024-11-05 ENCOUNTER — Ambulatory Visit

## 2024-11-05 ENCOUNTER — Ambulatory Visit: Admitting: Podiatry

## 2024-11-05 DIAGNOSIS — Z0000 Encounter for general adult medical examination without abnormal findings: Secondary | ICD-10-CM

## 2024-11-05 NOTE — Progress Notes (Signed)
 No chief complaint on file.    Subjective:   Molly Faulkner is a 83 y.o. female who presents for a Medicare Annual Wellness Visit.  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   Sleep apnea    H/O   NO LONGER   Syncope    Past Surgical History:  Procedure Laterality Date   ABDOMINAL HYSTERECTOMY  1994   total   BREAST BIOPSY Bilateral 8018,8016   BREAST EXCISIONAL BIOPSY     CARPAL TUNNEL RELEASE Bilateral    CATARACT EXTRACTION W/PHACO Right 12/02/2017   Procedure: CATARACT EXTRACTION PHACO AND INTRAOCULAR LENS PLACEMENT (IOC);  Surgeon: Jaye Fallow, MD;  Location: [facility] ORS;  Service: Ophthalmology;  Laterality: Right;  US  00:36 AP% 11.5 CDE 4.21 Fluid pack lot # [id number]   CATARACT EXTRACTION W/PHACO Left 12/30/2017   Procedure: CATARACT EXTRACTION PHACO AND INTRAOCULAR LENS PLACEMENT (IOC);  Surgeon: Jaye Fallow, MD;  Location: [facility] ORS;  Service: Ophthalmology;  Laterality: Left;  US  00:58 AP% 11.6 CDE 6.80 Fluid pack lot # [id number]   COLONOSCOPY WITH PROPOFOL  N/A 06/30/2018   Procedure: COLONOSCOPY WITH PROPOFOL ;  Surgeon: Therisa Bi, MD;  Location: [facility] ENDOSCOPY;  Service: Gastroenterology;  Laterality: N/A;   COLONOSCOPY WITH PROPOFOL  N/A 01/07/2022   Procedure: COLONOSCOPY WITH PROPOFOL ;  Surgeon: Unk Corinn Skiff, MD;  Location: [facility] ENDOSCOPY;  Service: Gastroenterology;  Laterality: N/A;   DILATION AND CURETTAGE OF UTERUS     X  2   KNEE ARTHROSCOPY     Family History  Problem Relation Age of Onset   Breast cancer Mother 64   Arthritis Maternal Grandmother    Diabetes Maternal Grandmother    Social History   Occupational History   Not on file  Tobacco Use   Smoking status: Former    Current packs/day: 0.50    Average packs/day: 0.5 packs/day for 7.0 years (3.5 ttl pk-yrs)    Types: Cigarettes   Smokeless tobacco: Never   Tobacco comments:    quit over 40 years ago  Vaping Use   Vaping status: Never Used  Substance and Sexual Activity   Alcohol use: Yes    Alcohol/week: 2.0 standard drinks of alcohol    Types: 2 Shots of liquor per week   Drug use: No   Sexual activity: Not Currently   Tobacco Counseling Counseling given: Not Answered Tobacco comments: quit over 40 years ago  SDOH Screenings   Food Insecurity: No Food Insecurity (08/28/2023)  Housing: Low Risk  (08/28/2023)  Transportation Needs: No Transportation Needs (08/28/2023)  Utilities: Not At Risk (08/28/2023)  Alcohol Screen: Low Risk  (08/28/2023)  Depression (PHQ2-9): Low Risk  (10/07/2024)  Financial Resource Strain: Low Risk  (08/28/2023)  Physical Activity: Insufficiently Active (08/28/2023)  Social Connections: Moderately Isolated (08/28/2023)  Stress: No Stress Concern Present (08/28/2023)  Tobacco Use: Medium Risk (10/07/2024)  Health Literacy: Adequate Health  Literacy (08/28/2023)   See flowsheets for full screening details  Depression Screen PHQ 2 & 9 Depression Scale- Over the past 2 weeks, how often have you been bothered by any of the following problems? Little interest or pleasure in doing things: 0 Feeling down, depressed, or hopeless (PHQ Adolescent also includes...irritable): 0 PHQ-2 Total Score: 0 Trouble falling or staying asleep, or sleeping too much: 1 Feeling tired or having little energy: 1 Poor appetite or overeating (PHQ Adolescent also includes...weight loss): 0 Feeling bad about yourself - or that you are a  failure or have let yourself or your family down: 0 Trouble concentrating on things, such as reading the newspaper or watching television (PHQ Adolescent also includes...like school work): 0 Moving or speaking so slowly that other people could have noticed. Or the opposite - being so fidgety or restless that you have been moving around a lot more than usual: 0 Thoughts that you would be better off dead, or of hurting yourself in some way: 0 PHQ-9 Total Score: 2 If you checked off any problems, how difficult have these problems made it for you to do your work, take care of things at home, or get along with other people?: Somewhat difficult     Goals Addressed   None    Visit info / Clinical Intake: Interpreter Needed?: No  Fall Screening Falls in the past year?: 0        Objective:    There were no vitals filed for this visit. There is no height or weight on file to calculate BMI.  Current Medications (verified) Outpatient Encounter Medications as of 11/05/2024  Medication Sig   acetaminophen  (TYLENOL ) 500 MG tablet Take 500 mg by mouth every 8 (eight) hours as needed for moderate pain.   aspirin  EC 81 MG tablet Take 1 tablet (81 mg total) by mouth daily. Swallow whole.   atenolol  (TENORMIN ) 25 MG tablet TAKE 1 TABLET EVERY DAY   azelastine  (ASTELIN ) 0.1 % nasal spray Place 1 spray into both nostrils 2 (two) times daily. Use in each nostril as directed   CREAM BASE EX Apply 1-2 application topically 4 (four) times daily as needed (for arthritis pain.). Penetrex Inflammation Formula (Arnica/Pyridoxine/MSM/Boswellia/Cetyl Myristoleate)   cyclobenzaprine  (FLEXERIL ) 5 MG tablet TAKE 1/2 TABLET(2.5 MG) BY MOUTH AT BEDTIME   ezetimibe  (ZETIA ) 10 MG tablet Take 1 tablet (10 mg total) by mouth daily.   hydrochlorothiazide  (HYDRODIURIL ) 25 MG tablet TAKE 1 TABLET(25 MG) BY MOUTH EVERY DAY   hydrOXYzine  (ATARAX ) 10 MG tablet Take 1 tablet (10 mg total) by mouth at bedtime.   levocetirizine  (XYZAL ) 5 MG tablet TAKE 1 TABLET(5 MG) BY MOUTH EVERY EVENING   losartan  (COZAAR ) 50 MG tablet Take 1 tablet (50 mg total) by mouth 2 (two) times daily.   Multiple Vitamins-Minerals (EYE MULTIVITAMIN PO) Take by mouth. [employer] Eye   Suvorexant  (BELSOMRA ) 5 MG TABS Take 1 tablet (5 mg total) by mouth at bedtime as needed.   No facility-administered encounter medications on file as of 11/05/2024.   Hearing/Vision screen No results found. Immunizations and Health Maintenance Health Maintenance  Topic Date Due   Zoster Vaccines- Shingrix (1 of 2) 05/26/1962   COVID-19 Vaccine (4 - 2025-26 season) 08/16/2024   Medicare Annual Wellness (AWV)  08/27/2024   Colonoscopy  01/07/2025   DTaP/Tdap/Td (2 - Td or Tdap) 10/07/2025 (Originally 12/16/2021)   Pneumococcal Vaccine: 50+ Years  Completed   Influenza Vaccine  Completed   Bone Density Scan  Completed  Meningococcal B Vaccine  Aged Out   Hepatitis C Screening  Discontinued        Assessment/Plan:  This is a routine wellness examination for Merelin.  Patient Care Team: Antonette Angeline ORN, NP as PCP - General (Internal Medicine) Perla Evalene PARAS, MD as PCP - Cardiology (Cardiology) Perla Evalene PARAS, MD as Consulting Physician (Cardiology)  I have personally reviewed and noted the following in the patient's chart:   Medical and social history Use of alcohol, tobacco or illicit drugs  Current medications and supplements including opioid prescriptions. Functional ability and status Nutritional status Physical activity Advanced directives List of other physicians Hospitalizations, surgeries, and ER visits in previous 12 months Vitals Screenings to include cognitive, depression, and falls Referrals and appointments  No orders of the defined types were placed in this encounter.  In addition, I have reviewed and discussed with patient certain preventive protocols, quality metrics, and best practice recommendations. A written  personalized care plan for preventive services as well as general preventive health recommendations were provided to patient.   Jhonnie GORMAN Das, LPN   [id number]/[id number]   No follow-ups on file.  After Visit Summary: (MyChart) Due to this being a telephonic visit, the after visit summary with patients personalized plan was offered to patient via MyChart   Nurse Notes: NEEDS TDAP, SHINGRIX; MAMMOGRAM UP TO DATE; UP TO DATE ON COLONOSCOPY; UP TO DATE ON BDS

## 2024-11-05 NOTE — Patient Instructions (Addendum)
 Molly Faulkner,  Thank you for taking the time for your Medicare Wellness Visit. I appreciate your continued commitment to your health goals. Please review the care plan we discussed, and feel free to reach out if I can assist you further.  Please note that Annual Wellness Visits do not include a physical exam. Some assessments may be limited, especially if the visit was conducted virtually. If needed, we may recommend an in-person follow-up with your provider.  Ongoing Care Seeing your primary care provider every 3 to 6 months helps us  monitor your health and provide consistent, personalized care.   Referrals If a referral was made during today's visit and you haven't received any updates within two weeks, please contact the referred provider directly to check on the status.  Recommended Screenings:  Health Maintenance  Topic Date Due   Zoster (Shingles) Vaccine (1 of 2) 05/26/1962   COVID-19 Vaccine (4 - 2025-26 season) 08/16/2024   Colon Cancer Screening  01/07/2025   DTaP/Tdap/Td vaccine (2 - Td or Tdap) 10/07/2025*   Breast Cancer Screening  11/01/2025   Medicare Annual Wellness Visit  11/05/2025   Osteoporosis screening with Bone Density Scan  10/18/2027   Pneumococcal Vaccine for age over 9  Completed   Flu Shot  Completed   Meningitis B Vaccine  Aged Out   Hepatitis C Screening  Discontinued  *Topic was postponed. The date shown is not the original due date.     Vision: Annual vision screenings are recommended for early detection of glaucoma, cataracts, and diabetic retinopathy. These exams can also reveal signs of chronic conditions such as diabetes and high blood pressure.  Dental: Annual dental screenings help detect early signs of oral cancer, gum disease, and other conditions linked to overall health, including heart disease and diabetes.  Please see the attached documents for additional preventive care recommendations.   NEXT AWV  11/09/25 @ 2:00 PM IN PERSON

## 2024-11-09 ENCOUNTER — Ambulatory Visit: Admitting: Podiatry

## 2024-11-09 DIAGNOSIS — B351 Tinea unguium: Secondary | ICD-10-CM | POA: Diagnosis not present

## 2024-11-09 NOTE — Progress Notes (Unsigned)
 Subjective:  Patient ID: Molly Faulkner, female    DOB: 11-19-1941,  MRN: [id number]  Chief Complaint  Patient presents with   Numbness    83 y.o. female presents with the above complaint.  Patient presents with bilateral hallux and second digit onychomycosis thickened onychodystrophy mycotic nail x 4.  She wanted to get it evaluated hurts with ambulation or shoe pressure she wants to discuss treatment options for this she does not want to take any oral medication.  She would like to discuss topical options if possible.   Review of Systems: Negative except as noted in the HPI. Denies N/V/F/Ch.  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 Swallow whole., Disp: , Rfl:    atenolol  (TENORMIN ) 25 MG tablet, TAKE 1 TABLET EVERY DAY, Disp: 90 tablet, Rfl: 3   azelastine  (ASTELIN ) 0.1 % nasal spray, Place 1 spray into both nostrils 2 (two) times daily. Use in each nostril as directed (Patient not taking: Reported on 11/05/2024), Disp: 30 mL, Rfl: 5   CREAM BASE EX, Apply 1-2 application topically 4 (four) times daily as needed (for arthritis pain.). Penetrex Inflammation Formula (Arnica/Pyridoxine/MSM/Boswellia/Cetyl Myristoleate), Disp: , Rfl:    cyclobenzaprine  (FLEXERIL ) 5 MG tablet, TAKE 1/2 TABLET(2.5 MG) BY MOUTH AT BEDTIME  (Patient taking differently: TAKES PRN), Disp: 30 tablet, Rfl: 0   ezetimibe  (ZETIA ) 10 MG tablet, Take 1 tablet (10 mg total) by mouth daily. (Patient taking differently: Take 5 mg by mouth daily. 1/2 TAB OF 10MG ), Disp: 90 tablet, Rfl: 3   hydrochlorothiazide  (HYDRODIURIL ) 25 MG tablet, TAKE 1 TABLET(25 MG) BY MOUTH EVERY DAY, Disp: 90 tablet, Rfl: 2   hydrOXYzine  (ATARAX ) 10 MG tablet, Take 1 tablet (10 mg total) by mouth at bedtime., Disp: 90 tablet, Rfl: 0   levocetirizine (XYZAL ) 5 MG tablet, TAKE 1 TABLET(5 MG) BY MOUTH EVERY EVENING (Patient not taking: Reported on 11/05/2024), Disp: 90 tablet, Rfl: 1   losartan  (COZAAR ) 50 MG tablet, Take 1 tablet (50 mg total) by mouth 2 (two) times daily., Disp: 180 tablet, Rfl: 1   Multiple Vitamins-Minerals (EYE MULTIVITAMIN PO), Take by mouth. [employer] (Patient not taking: Reported on 11/05/2024), Disp: , Rfl:    Suvorexant  (BELSOMRA ) 5 MG TABS, Take 1 tablet (5 mg total) by mouth at bedtime as needed., Disp: 30 tablet, Rfl: 0  Social History   Tobacco Use  Smoking Status Former   Current packs/day: 0.50   Average packs/day: 0.5 packs/day for 7.0 years (3.5 ttl pk-yrs)   Types: Cigarettes  Smokeless Tobacco Never  Tobacco Comments   quit over 40 years ago  Allergies  Allergen Reactions   Avelox  [Moxifloxacin  Hcl In Nacl] Other (See Comments)    Chills   Moxifloxacin      Other Reaction(s): Not available  moxifloxacin    Statins Other (See Comments)    Dark urine and leg weakness   Trazodone  And Nefazodone Swelling   Clarithromycin Other (See Comments)    Unsure of reaction type  Other Reaction(s): Not available  clarithromycin   Objective:  There were no vitals filed for this visit. There is no height or weight on file to calculate BMI. Constitutional Well developed. Well nourished.  Vascular Dorsalis pedis pulses palpable bilaterally. Posterior tibial pulses palpable bilaterally. Capillary refill normal to all digits.   No cyanosis or clubbing noted. Pedal hair growth normal.  Neurologic Normal speech. Oriented to person, place, and time. Epicritic sensation to light touch grossly present bilaterally.  Dermatologic Nails thickened onychodystrophy mycotic nail x 4 bilateral hallux bilateral second digit.  No pain on palpation Skin none  Orthopedic: Normal joint ROM without pain or crepitus bilaterally. No visible deformities. No bony tenderness.   Radiographs: None Assessment:   1. Onychomycosis due to dermatophyte    Plan:  Patient was evaluated and treated and all questions answered.  Bilateral hallux and second digit onychomycosis -Educated the patient on the etiology of onychomycosis and various treatment options associated with improving the fungal load.  I explained to the patient that there is 3 treatment options available to treat the onychomycosis including topical, p.o., laser treatment.  Patient elected to undergo topical options with Penlac Penlac was sent to the pharmacy encouraged her to apply twice a day she states understanding   No follow-ups on file.

## 2024-11-15 ENCOUNTER — Ambulatory Visit: Admitting: Physical Therapy

## 2024-11-18 ENCOUNTER — Encounter: Admitting: Physical Therapy

## 2024-11-22 ENCOUNTER — Ambulatory Visit: Admitting: Physical Therapy

## 2024-11-25 ENCOUNTER — Ambulatory Visit: Admitting: Physical Therapy

## 2024-12-15 ENCOUNTER — Ambulatory Visit: Payer: Self-pay

## 2024-12-15 NOTE — Telephone Encounter (Signed)
 FYI Only or Action Required?: Action required by provider: clinical question for provider, update on patient condition, and would like a call back.  Patient was last seen in primary care on 10/07/2024 by Antonette Angeline ORN, NP.  Called Nurse Triage reporting Abdominal Pain.  Symptoms began several days ago.  Interventions attempted: Rest, hydration, or home remedies.  Symptoms are: unchanged.  Triage Disposition: See HCP Within 4 Hours (Or PCP Triage)  Patient/caregiver understands and will follow disposition?: No, wishes to speak with PCP  Copied from CRM #[id number]. Topic: Clinical - Red Word Triage >> Dec 15, 2024  3:07 PM Harlene ORN wrote: Red Word that prompted transfer to Nurse Triage: diverticulitis attack; lower abdominal pain Reason for Disposition  [1] MILD-MODERATE pain AND [2] constant AND [3] age > 60 years  Answer Assessment - Initial Assessment Questions Hx of diverticulosis-patient reports having had diverticulitis before. Patient is asking for recommendations from provider. Patient doesn't feel like she is at the point of going to UC just yet. Asking for a call back.   1. LOCATION: Where does it hurt?      Left lower abdominal pain 2. RADIATION: Does the pain shoot anywhere else? (e.g., chest, back)     No radiation 3. ONSET: When did the pain begin? (e.g., minutes, hours or days ago)      Saturday morning 4. SUDDEN: Gradual or sudden onset?     gradual 5. PATTERN Does the pain come and go, or is it constant?     Comes and goes 6. SEVERITY: How bad is the pain?  (e.g., Scale 1-10; mild, moderate, or severe)     5-6 out of 10 7. RECURRENT SYMPTOM: Have you ever had this type of stomach pain before? If Yes, ask: When was the last time? and What happened that time?      unsure 8. CAUSE: What do you think is causing the stomach pain? (e.g., gallstones, recent abdominal surgery)     Concerned for possible diverticulitis 9. RELIEVING/AGGRAVATING  FACTORS: What makes it better or worse? (e.g., antacids, bending or twisting motion, bowel movement)     Walking around and after having a bowel movement helps the pain be better. 10. OTHER SYMPTOMS: Do you have any other symptoms? (e.g., back pain, diarrhea, fever, urination pain, vomiting)       no  Protocols used: Abdominal Pain - Female-A-AH

## 2024-12-20 ENCOUNTER — Encounter: Payer: Self-pay | Admitting: Internal Medicine

## 2024-12-20 ENCOUNTER — Ambulatory Visit: Admitting: Internal Medicine

## 2024-12-20 VITALS — BP 110/68 | Ht 60.0 in | Wt 166.0 lb

## 2024-12-20 DIAGNOSIS — R140 Abdominal distension (gaseous): Secondary | ICD-10-CM

## 2024-12-20 DIAGNOSIS — I700 Atherosclerosis of aorta: Secondary | ICD-10-CM | POA: Diagnosis not present

## 2024-12-20 DIAGNOSIS — M479 Spondylosis, unspecified: Secondary | ICD-10-CM

## 2024-12-20 DIAGNOSIS — E782 Mixed hyperlipidemia: Secondary | ICD-10-CM

## 2024-12-20 DIAGNOSIS — R1032 Left lower quadrant pain: Secondary | ICD-10-CM | POA: Diagnosis not present

## 2024-12-20 DIAGNOSIS — K5790 Diverticulosis of intestine, part unspecified, without perforation or abscess without bleeding: Secondary | ICD-10-CM | POA: Diagnosis not present

## 2024-12-20 MED ORDER — LOSARTAN POTASSIUM 50 MG PO TABS: 50.0000 mg | ORAL_TABLET | Freq: Two times a day (BID) | ORAL | 1 refills | Status: AC

## 2024-12-20 NOTE — Progress Notes (Signed)
 "  Subjective:    Patient ID: Molly Faulkner, female    DOB: 1941-10-30, 84 y.o.   MRN: [id number]  HPI  Discussed the use of AI scribe software for clinical note transcription with the patient, who gave verbal consent to proceed.  Molly Faulkner is an 84 year old female with diverticulosis who presents with abdominal pain and bloating.  She began experiencing abdominal pain and bloating right after Christmas, following a period of overeating foods she typically avoids. The pain was localized to the left lower quadrant and was accompanied by a generalized bloated feeling described as 'bubbly'. No heartburn, reflux, constipation, or diarrhea.  Her symptoms began to improve by the following Tuesday and resolved completely by Saturday. During this period, she self-initiated a semi-liquid diet, transitioning to a soft diet, which she believes helped alleviate her symptoms.  Her past medical history includes diverticulosis, as noted in a colonoscopy from 2023, which showed diverticula throughout her colon.  She also mentions a history of lower pelvic or colon-related strain, which can be influenced by her sleeping position, leading to soreness and bloating. No urinary or vaginal symptoms but notes occasional darker urine since.  She attributes the dark urine to ezetimibe .  She is currently taking losartan  twice a day and hydrochlorothiazide , with a recent prescription refill noted for the latter. She also mentions using orthotics from [facility], which have helped alleviate back pain, and a vibrating back pad, which she suspects may have contributed to her abdominal discomfort but has also helped her back pain.        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     aspirin  EC 81 MG tablet Take 1 tablet (81 mg total) by mouth daily. Swallow whole.     atenolol  (TENORMIN ) 25 MG tablet TAKE 1 TABLET EVERY DAY 90 tablet 3   azelastine  (ASTELIN ) 0.1 % nasal spray Place 1 spray into both nostrils 2 (two) times daily. Use in each nostril as directed (Patient not taking: Reported on 11/05/2024) 30 mL 5   CREAM BASE EX Apply 1-2 application topically 4 (four) times daily as needed (for arthritis pain.). Penetrex Inflammation Formula (Arnica/Pyridoxine/MSM/Boswellia/Cetyl Myristoleate)     cyclobenzaprine  (FLEXERIL ) 5 MG tablet TAKE 1/2 TABLET(2.5 MG) BY MOUTH AT BEDTIME (Patient taking differently: TAKES PRN) 30 tablet 0   ezetimibe  (ZETIA ) 10 MG tablet Take 1 tablet (10 mg total) by mouth daily. (Patient taking differently: Take 5 mg by mouth daily. 1/2 TAB OF 10MG ) 90 tablet 3   hydrochlorothiazide  (HYDRODIURIL ) 25 MG tablet TAKE 1 TABLET(25 MG) BY MOUTH EVERY DAY 90 tablet 2   hydrOXYzine  (ATARAX )  10 MG tablet Take 1 tablet (10 mg total) by mouth at bedtime. 90 tablet 0   levocetirizine (XYZAL ) 5 MG tablet TAKE 1 TABLET(5 MG) BY MOUTH EVERY EVENING (Patient not taking: Reported on 11/05/2024) 90 tablet 1   losartan  (COZAAR ) 50 MG tablet Take 1 tablet (50 mg total) by mouth 2 (two) times daily. 180 tablet 1   Multiple Vitamins-Minerals (EYE MULTIVITAMIN PO) Take by mouth. [employer] (Patient not taking: Reported on 11/05/2024)     Suvorexant  (BELSOMRA ) 5 MG TABS Take 1 tablet (5 mg total) by mouth at bedtime as needed. 30 tablet 0   No  current facility-administered medications for this visit.    Allergies  Allergen Reactions   Avelox  [Moxifloxacin  Hcl In Nacl] Other (See Comments)    Chills   Moxifloxacin      Other Reaction(s): Not available  moxifloxacin    Statins Other (See Comments)    Dark urine and leg weakness   Trazodone  And Nefazodone Swelling   Clarithromycin Other (See Comments)    Unsure of reaction type  Other Reaction(s): Not available  clarithromycin    Family History  Problem Relation Age of Onset   Breast cancer Mother 73   Arthritis Maternal Grandmother    Diabetes Maternal Grandmother     Social History   Socioeconomic History   Marital status: Single    Spouse name: Not on file   Number of children: Not on file   Years of education: Not on file   Highest education level: Not on file  Occupational History   Not on file  Tobacco Use   Smoking status: Former    Current packs/day: 0.50    Average packs/day: 0.5 packs/day for 7.0 years (3.5 ttl pk-yrs)    Types: Cigarettes   Smokeless tobacco: Never   Tobacco comments:    quit over 40 years ago  Vaping Use   Vaping status: Never Used  Substance and Sexual Activity   Alcohol use: Yes    Alcohol/week: 2.0 standard drinks of alcohol    Types: 2 Shots of liquor per week   Drug use: No   Sexual activity: Not Currently  Other Topics Concern   Not on file  Social History Narrative   Not on file   Social Drivers of Health   Tobacco Use: Medium Risk (11/05/2024)   Patient History    Smoking Tobacco Use: Former    Smokeless Tobacco Use: Never    Passive Exposure: Not on file  Financial Resource Strain: Low Risk (08/28/2023)   Overall Financial Resource Strain (CARDIA)    Difficulty of Paying Living Expenses: Not very hard  Food Insecurity: No Food Insecurity (11/05/2024)   Epic    Worried About Programme Researcher, Broadcasting/film/video in the Last Year: Never true    Ran Out of Food in the Last Year: Never true  Transportation Needs: No  Transportation Needs (11/05/2024)   Epic    Lack of Transportation (Medical): No    Lack of Transportation (Non-Medical): No  Physical Activity: Insufficiently Active (11/05/2024)   Exercise Vital Sign    Days of Exercise per Week: 2 days    Minutes of Exercise per Session: 20 min  Stress: No Stress Concern Present (11/05/2024)   [employer] of Occupational Health - Occupational Stress Questionnaire    Feeling of Stress: Only a little  Social Connections: Socially Isolated (11/05/2024)   Social Connection and Isolation Panel    Frequency of Communication with Friends and Family: More than three  times a week    Frequency of Social Gatherings with Friends and Family: Never    Attends Religious Services: Never    Database Administrator or Organizations: No    Attends Banker Meetings: Never    Marital Status: Divorced  Catering Manager Violence: Not At Risk (11/05/2024)   Epic    Fear of Current or Ex-Partner: No    Emotionally Abused: No    Physically Abused: No    Sexually Abused: No  Depression (PHQ2-9): Low Risk (11/05/2024)   Depression (PHQ2-9)    PHQ-2 Score: 0  Alcohol Screen: Low Risk (08/28/2023)   Alcohol Screen    Last Alcohol Screening Score (AUDIT): 4  Housing: Unknown (11/05/2024)   Epic    Unable to Pay for Housing in the Last Year: No    Number of Times Moved in the Last Year: Not on file    Homeless in the Last Year: No  Utilities: Not At Risk (11/05/2024)   Epic    Threatened with loss of utilities: No  Health Literacy: Adequate Health Literacy (11/05/2024)   B1300 Health Literacy    Frequency of need for help with medical instructions: Never     Constitutional: Denies fever, malaise, fatigue, headache or abrupt weight changes.  HEENT: Denies eye pain, eye redness, ear pain, ringing in the ears, wax buildup, runny nose, nasal congestion, bloody nose, or sore throat. Respiratory: Denies difficulty breathing, shortness of breath, cough or  sputum production.   Cardiovascular: Denies chest pain, chest tightness, palpitations or swelling in the hands or feet.  Gastrointestinal: Patient reports LLQ abdominal pain and bloating.  Denies constipation, diarrhea or blood in the stool.  GU: Denies urgency, frequency, pain with urination, burning sensation, blood in urine, odor or discharge. Musculoskeletal: Patient reports joint pain.  Denies decrease in range of motion, difficulty with gait, muscle pain or joint swelling.  Skin: Denies redness, rashes, lesions or ulcercations.  Neurological: Patient reports insomnia, paresthesia of feet.  Denies dizziness, difficulty with memory, difficulty with speech or problems with balance and coordination.  Psych: Patient has a history of depression.  Denies anxiety, SI/HI.  No other specific complaints in a complete review of systems (except as listed in HPI above).  Objective:   Physical Exam BP 110/68 (BP Location: Left Arm, Patient Position: Sitting, Cuff Size: Normal)   Ht 5' (1.524 m)   Wt 166 lb (75.3 kg)   BMI 32.42 kg/m     Wt Readings from Last 3 Encounters:  10/07/24 169 lb 9.6 oz (76.9 kg)  09/01/24 167 lb 12.8 oz (76.1 kg)  08/24/24 167 lb 9.6 oz (76 kg)    General: Appears her stated age, obese, in NAD. Cardiovascular: Normal rate and rhythm. S1,S2 noted.   Pulmonary/Chest: Normal effort and positive vesicular breath sounds. No respiratory distress. No wheezes, rales or ronchi noted.  Abdomen: Soft and nontender.  Hypoactive bowel sounds.  Musculoskeletal:  No difficulty with gait.  Neurological: Alert and oriented.    BMET    Component Value Date/Time   NA 139 10/07/2024 1333   NA 140 01/10/2020 0950   K 4.2 10/07/2024 1333   CL 105 10/07/2024 1333   CO2 28 10/07/2024 1333   GLUCOSE 91 10/07/2024 1333   BUN 18 10/07/2024 1333   BUN 15 01/10/2020 0950   CREATININE 0.73 10/07/2024 1333   CALCIUM  11.0 (H) 10/07/2024 1333   GFRNONAA 64 01/10/2020 0950   GFRAA 74  01/10/2020 0950  Lipid Panel     Component Value Date/Time   CHOL 142 10/07/2024 1333   CHOL 191 06/02/2024 1158   TRIG 67 10/07/2024 1333   HDL 53 10/07/2024 1333   HDL 61 06/02/2024 1158   CHOLHDL 2.7 10/07/2024 1333   VLDL 9 03/22/2020 1139   LDLCALC 75 10/07/2024 1333    CBC    Component Value Date/Time   WBC 5.6 10/07/2024 1333   RBC 3.80 10/07/2024 1333   HGB 12.3 10/07/2024 1333   HGB 13.9 01/10/2020 0950   HCT 36.7 10/07/2024 1333   HCT 40.1 01/10/2020 0950   PLT 224 10/07/2024 1333   PLT 227 01/10/2020 0950   MCV 96.6 10/07/2024 1333   MCV 97 01/10/2020 0950   MCH 32.4 10/07/2024 1333   MCHC 33.5 10/07/2024 1333   RDW 12.7 10/07/2024 1333   RDW 12.3 01/10/2020 0950   LYMPHSABS 1.0 01/10/2020 0950   EOSABS 0.3 01/10/2020 0950   BASOSABS 0.0 01/10/2020 0950    Hgb A1C Lab Results  Component Value Date   HGBA1C 4.9 10/07/2024            Assessment & Plan:   Assessment and Plan    Diverticulosis of colon Intermittent left lower quadrant pain and bloating likely due to dietary indiscretion. Symptoms improved with dietary changes. Previous colonoscopy showed diverticula. No progression to diverticulitis. - Advised clear liquid diet for 24 hours if symptoms recur. - Continue semi-liquid diet as tolerated. - Avoid spicy and greasy foods. - Monitor for signs of diverticulitis flare.  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 Vibration may increase peristalsis and cause bloating. - Continue use of [employer] Orthotics as tolerated. - Monitor for changes in bowel symptoms related to vibration use.  Mixed hyperlipidemia with aortic atherosclerosis On ezetimibe  with occasional headaches and darker urine. Headaches worsened with increased dosage, currently on 5 mg. Plan to reevaluate cholesterol levels. - Continue current statin dosage of 5 mg. -  Reevaluate cholesterol levels in a few months.        RTC in 3 months, follow-up chronic conditions Molly Laura, NP    "

## 2024-12-20 NOTE — Patient Instructions (Signed)
Diverticulosis  Diverticulosis is when small pouches called diverticula form in the wall of the colon. The colon is where water is absorbed. It is also where poop (stool) is formed. The pouches form when the inside layer of the colon pushes through weak spots in the outer layers of the colon. You may have a few pouches or many of them. In most cases, the pouches do not cause problems. If they become inflamed or infected, you may have a condition called diverticulitis. What are the causes? The cause of this condition is not known. What increases the risk? You are more likely to get this condition if: You are older than 82 years of age. You do not eat enough fiber or you get constipated a lot. You are overweight. You do not get enough exercise. You smoke. You take over-the-counter pain medicines. You have a family history of the condition. What are the signs or symptoms? In most people, there are no symptoms. If you do have symptoms, they may include: Bloating. Stomach cramps. Constipation or diarrhea. Pain in the lower left side of your abdomen. How is this diagnosed? This condition is often diagnosed during an exam for other colon problems. It may be diagnosed when you have: A colonoscopy. This is when a tube with a camera on the end is used to look at your colon. A barium enema. This is an X-ray exam that uses dye to look at your colon. A CT scan. How is this treated? You may not need treatment. Your health care provider will tell you what you can do at home to help prevent problems. You may need treatment if you have symptoms or if you have had diverticulitis before. You may be told to: Eat a high-fiber diet. Take medicine to relax your colon. Lose weight. Follow these instructions at home: Medicines Take over-the-counter and prescription medicines only as told by your provider. If told, take a fiber supplement or probiotic. Managing constipation Your condition may cause  constipation. To prevent or treat constipation, you may need to: Drink enough fluid to keep your pee (urine) pale yellow. Take over-the-counter or prescription medicines. Eat foods that are high in fiber, such as beans, whole grains, and fresh fruits and vegetables. Limit foods that are high in fat and processed sugars, such as fried or sweet foods. Try not to strain when you poop. Contact a health care provider if: Your symptoms get worse all of a sudden. You have pain in your abdomen that gets worse. You have bloating or stomach cramps. You continue to have frequent constipation. You have a fever or chills. You vomit. Your poop is bloody, black, or tarry. This information is not intended to replace advice given to you by your health care provider. Make sure you discuss any questions you have with your health care provider. Document Revised: 08/29/2022 Document Reviewed: 08/29/2022 Elsevier Patient Education  2024 [employer]vier Inc.  

## 2024-12-21 ENCOUNTER — Telehealth: Payer: Self-pay | Admitting: Internal Medicine

## 2024-12-21 NOTE — Telephone Encounter (Unsigned)
 Copied from CRM [id number]. Topic: Clinical - Medication Refill >> Dec 21, 2024  2:02 PM Amber H wrote: Medication: hydrochlorothiazide  (HYDRODIURIL ) 25 MG tablet.  Has the patient contacted their pharmacy? No, Patient wanted to know if NP Angeline Laura could write her a new prescription. I advised to her that the medication was pcd by another doctor and she confirmed it was her cardiologist and that she was still seeing him. I advised to her that since she was still seeing him it was best to contact him. Patient is adamant that provider writes her a new prescription because she is out of refills.  (Agent: If no, request that the patient contact the pharmacy for the refill. If patient does not wish to contact the pharmacy document the reason why and proceed with request.) (Agent: If yes, when and what did the pharmacy advise?)  This is the patient's preferred pharmacy:  [id number] [place], [place] - [address] AT [facility] OF [address] [place] [address] Phone: [phone] Fax: [phone]  Is this the correct pharmacy for this prescription? Yes If no, delete pharmacy and type the correct one.   Has the prescription been filled recently? Yes  Is the patient out of the medication? No, has enough until Sunday   Has the patient been seen for an appointment in the last year OR does the patient have an upcoming appointment? Yes, 12/20/2024  Can we respond through MyChart? Yes  Agent: Please be advised that Rx refills may take up to 3 business days. We ask that you follow-up with your pharmacy.

## 2025-01-01 ENCOUNTER — Encounter: Payer: Self-pay | Admitting: Internal Medicine

## 2025-01-01 DIAGNOSIS — Z789 Other specified health status: Secondary | ICD-10-CM

## 2025-01-06 ENCOUNTER — Other Ambulatory Visit

## 2025-01-06 ENCOUNTER — Other Ambulatory Visit: Payer: Self-pay

## 2025-01-06 DIAGNOSIS — Z789 Other specified health status: Secondary | ICD-10-CM

## 2025-01-07 LAB — MEASLES/MUMPS/RUBELLA IMMUNITY
Mumps IgG: >300 [AU]/ml
Rubella: <0.9 {index} — ABNORMAL LOW
Rubeola IgG: >300 [AU]/ml

## 2025-01-10 ENCOUNTER — Ambulatory Visit: Payer: Self-pay | Admitting: Internal Medicine

## 2025-04-07 ENCOUNTER — Ambulatory Visit: Admitting: Internal Medicine

## 2025-11-09 ENCOUNTER — Ambulatory Visit
